# Patient Record
Sex: Female | Born: 1950 | Race: White | Hispanic: No | Marital: Married | Smoking: Former smoker
Health system: Southern US, Community
[De-identification: ages and names within clinical notes are randomized; demographics above are authoritative.]

## PROBLEM LIST (undated history)

## (undated) DIAGNOSIS — I10 Essential (primary) hypertension: Secondary | ICD-10-CM

## (undated) DIAGNOSIS — S7291XA Unspecified fracture of right femur, initial encounter for closed fracture: Secondary | ICD-10-CM

## (undated) DIAGNOSIS — F909 Attention-deficit hyperactivity disorder, unspecified type: Secondary | ICD-10-CM

## (undated) DIAGNOSIS — F32A Depression, unspecified: Secondary | ICD-10-CM

## (undated) DIAGNOSIS — F419 Anxiety disorder, unspecified: Secondary | ICD-10-CM

## (undated) DIAGNOSIS — M549 Dorsalgia, unspecified: Secondary | ICD-10-CM

## (undated) DIAGNOSIS — E538 Deficiency of other specified B group vitamins: Secondary | ICD-10-CM

## (undated) DIAGNOSIS — A770 Spotted fever due to Rickettsia rickettsii: Secondary | ICD-10-CM

## (undated) DIAGNOSIS — R569 Unspecified convulsions: Secondary | ICD-10-CM

## (undated) DIAGNOSIS — G8929 Other chronic pain: Secondary | ICD-10-CM

## (undated) DIAGNOSIS — F329 Major depressive disorder, single episode, unspecified: Secondary | ICD-10-CM

## (undated) HISTORY — DX: Major depressive disorder, single episode, unspecified: F32.9

## (undated) HISTORY — PX: EYE SURGERY: SHX253

## (undated) HISTORY — DX: Deficiency of other specified B group vitamins: E53.8

## (undated) HISTORY — DX: Depression, unspecified: F32.A

## (undated) HISTORY — DX: Essential (primary) hypertension: I10

## (undated) HISTORY — PX: BACK SURGERY: SHX140

## (undated) HISTORY — PX: OTHER SURGICAL HISTORY: SHX169

---

## 2012-10-17 ENCOUNTER — Emergency Department (HOSPITAL_COMMUNITY): Payer: Medicare Other

## 2012-10-17 ENCOUNTER — Encounter (HOSPITAL_COMMUNITY): Payer: Self-pay | Admitting: Emergency Medicine

## 2012-10-17 ENCOUNTER — Observation Stay (HOSPITAL_COMMUNITY)
Admission: EM | Admit: 2012-10-17 | Discharge: 2012-10-17 | Disposition: A | Discharge status: Home / Self Care | Payer: Medicare Other | Attending: Emergency Medicine | Admitting: Emergency Medicine

## 2012-10-17 DIAGNOSIS — G8929 Other chronic pain: Secondary | ICD-10-CM | POA: Insufficient documentation

## 2012-10-17 DIAGNOSIS — G40909 Epilepsy, unspecified, not intractable, without status epilepticus: Principal | ICD-10-CM | POA: Insufficient documentation

## 2012-10-17 DIAGNOSIS — R569 Unspecified convulsions: Secondary | ICD-10-CM

## 2012-10-17 HISTORY — DX: Unspecified convulsions: R56.9

## 2012-10-17 HISTORY — DX: Spotted fever due to Rickettsia rickettsii: A77.0

## 2012-10-17 LAB — BASIC METABOLIC PANEL
CO2: 22 mEq/L (ref 19–32)
Calcium: 8.9 mg/dL (ref 8.4–10.5)
Creatinine, Ser: 0.82 mg/dL (ref 0.50–1.10)

## 2012-10-17 LAB — URINALYSIS, ROUTINE W REFLEX MICROSCOPIC
Glucose, UA: NEGATIVE mg/dL
Hgb urine dipstick: NEGATIVE
Ketones, ur: NEGATIVE mg/dL
Leukocytes, UA: NEGATIVE
Protein, ur: NEGATIVE mg/dL

## 2012-10-17 LAB — CBC WITH DIFFERENTIAL/PLATELET
Basophils Absolute: 0.1 10*3/uL (ref 0.0–0.1)
Basophils Relative: 2 % — ABNORMAL HIGH (ref 0–1)
Eosinophils Absolute: 0 10*3/uL (ref 0.0–0.7)
Eosinophils Relative: 1 % (ref 0–5)
HCT: 38.5 % (ref 36.0–46.0)
Lymphocytes Relative: 45 % (ref 12–46)
MCH: 28.4 pg (ref 26.0–34.0)
MCHC: 33.2 g/dL (ref 30.0–36.0)
MCV: 85.6 fL (ref 78.0–100.0)
Monocytes Absolute: 0.3 10*3/uL (ref 0.1–1.0)
RDW: 13.6 % (ref 11.5–15.5)

## 2012-10-17 LAB — RAPID URINE DRUG SCREEN, HOSP PERFORMED
Amphetamines: POSITIVE — AB
Tetrahydrocannabinol: NOT DETECTED

## 2012-10-17 MED ORDER — LORAZEPAM 2 MG/ML IJ SOLN
1.0000 mg | Freq: Once | INTRAMUSCULAR | Status: AC
  Administered 2012-10-17: 1 mg via INTRAVENOUS
  Filled 2012-10-17: qty 1

## 2012-10-17 NOTE — ED Notes (Signed)
Pt presents to ED via EMS after having 5 seizures in 2 days. Pt have history of seizures. CBG 60, pt states her cbg is normally 60-80. 20g in left hand.

## 2012-10-17 NOTE — ED Notes (Signed)
Pt discharged to home with family. NAD.  

## 2012-10-17 NOTE — ED Notes (Signed)
Entered room and patient had pulled IV out, pulled all ecg wires bp cuff and pulse ox off. Stated that she had a bad dream and did not remember doing any of that. Nero checks normal.

## 2012-10-17 NOTE — Consult Note (Signed)
Reason for Consult:Seizures Referring Physician: Pollina  CC: Seizures  HPI: Kelli Navarro is an 62 y.o. female with a history of seizures and chronic pain.  The patient reports that she has been told that she has tonic-clonic seizures.  Takes Topamax and Clonazepam.  Is only able to take these two medications-has been on 16 other anticonvulsants at some point in time and has had problems with all of them.  Her doctor has told her there is nothing else to try.  Her seizures are usually during sleep but for the past two days she has had multiple seizures during the day and during the night.  Today she could not take it any longer and presented for evaluation.   On Friday she was to pick up her pain medications and was told at that time that she needed to have a UDS first.  The patient was unable to do this and therefore was unable to get her medications.  She has been very stressed due to this and she feels this has likely brought on her seizures.    Past Medical History  Diagnosis Date  . [facility] spotted fever   . Seizures     History reviewed. No pertinent past surgical history.  History reviewed. No pertinent family history.  Social History:  has no tobacco, alcohol, and drug history on file.  No Known Allergies  Medications: I have reviewed the patient's current medications. Prior to Admission:  Current outpatient prescriptions:albuterol (PROVENTIL HFA;VENTOLIN HFA) 108 (90 BASE) MCG/ACT inhaler, Inhale 2 puffs into the lungs every 6 (six) hours as needed for wheezing., Disp: , Rfl: ;  amphetamine-dextroamphetamine (ADDERALL) 20 MG tablet, Take 20 mg by mouth every 6 (six) hours., Disp: , Rfl: ;  clonazePAM (KLONOPIN) 1 MG tablet, Take 1 mg by mouth every 8 (eight) hours., Disp: , Rfl:  HYDROcodone-acetaminophen (NORCO) 10-325 MG per tablet, Take 1 tablet by mouth every 6 (six) hours as needed for pain., Disp: , Rfl: ;  metoprolol succinate (TOPROL-XL) 50 MG 24 hr tablet, Take  50 mg by mouth daily. Take with or immediately following a meal., Disp: , Rfl: ;  oxymorphone (OPANA ER) 20 MG 12 hr tablet, Take 20 mg by mouth every 12 (twelve) hours., Disp: , Rfl:   ROS: History obtained from the patient  General ROS: negative for - chills, fatigue, fever, night sweats, weight gain or weight loss Psychological ROS: negative for - behavioral disorder, hallucinations, memory difficulties, mood swings or suicidal ideation Ophthalmic ROS: negative for - blurry vision, double vision, eye pain or loss of vision ENT ROS: negative for - epistaxis, nasal discharge, oral lesions, sore throat, tinnitus or vertigo Allergy and Immunology ROS: negative for - hives or itchy/watery eyes Hematological and Lymphatic ROS: negative for - bleeding problems, bruising or swollen lymph nodes Endocrine ROS: negative for - galactorrhea, hair pattern changes, polydipsia/polyuria or temperature intolerance Respiratory ROS: negative for - cough, hemoptysis, shortness of breath or wheezing Cardiovascular ROS: negative for - chest pain, dyspnea on exertion, edema or irregular heartbeat Gastrointestinal ROS: negative for - abdominal pain, diarrhea, hematemesis, nausea/vomiting or stool incontinence Genito-Urinary ROS: negative for - dysuria, hematuria, incontinence or urinary frequency/urgency Musculoskeletal ROS: chronic pain Neurological ROS: as noted in HPI Dermatological ROS: negative for rash and skin lesion changes  Physical Examination: Blood pressure 144/82, pulse 73, temperature 98.5 F (36.9 C), temperature source Oral, resp. rate 20, SpO2 98.00%.  Neurologic Examination Mental Status: On entering the room the patient was in a ball  crying and reporting that she was not going to talk to me because everyone had been lying to her today.  She eventually relaxed and was alert, oriented, thought content appropriate.  Speech fluent without evidence of aphasia.  Able to follow 3 step commands  without difficulty. Cranial Nerves: II: Discs flat bilaterally; Visual fields grossly normal, pupils equal, round, reactive to light and accommodation III,IV, VI: ptosis not present, extra-ocular motions intact bilaterally V,VII: smile symmetric, facial light touch sensation normal bilaterally VIII: hearing normal bilaterally IX,X: gag reflex present XI: bilateral shoulder shrug XII: midline tongue extension Motor: Right : Upper extremity   5/5    Left:     Upper extremity   5/5  Lower extremity   5/5     Lower extremity   5/5 Tone and bulk:normal tone throughout; no atrophy noted Sensory: Pinprick and light touch intact throughout, bilaterally Deep Tendon Reflexes: 2+ and symmetric with 1+ AJ's bilaterally Plantars: Right: downgoing   Left: downgoing Cerebellar: normal finger-to-nose and normal heel-to-shin test Gait: not tested CV: pulses palpable throughout   Laboratory Studies:   Basic Metabolic Panel:  Recent Labs Lab 10/17/12 1405  NA 143  K 3.7  CL 112  CO2 22  GLUCOSE 89  BUN 18  CREATININE 0.82  CALCIUM 8.9    Liver Function Tests: No results found for this basename: AST, ALT, ALKPHOS, BILITOT, PROT, ALBUMIN,  in the last 168 hours No results found for this basename: LIPASE, AMYLASE,  in the last 168 hours No results found for this basename: AMMONIA,  in the last 168 hours  CBC:  Recent Labs Lab 10/17/12 1405  WBC 3.0*  NEUTROABS 1.3*  HGB 12.8  HCT 38.5  MCV 85.6  PLT 268    Cardiac Enzymes: No results found for this basename: CKTOTAL, CKMB, CKMBINDEX, TROPONINI,  in the last 168 hours  BNP: No components found with this basename: POCBNP,   CBG: No results found for this basename: GLUCAP,  in the last 168 hours  Microbiology: No results found for this or any previous visit.  Coagulation Studies: No results found for this basename: LABPROT, INR,  in the last 72 hours  Urinalysis:  Recent Labs Lab 10/17/12 1550  COLORURINE YELLOW   LABSPEC 1.013  PHURINE 8.0  GLUCOSEU NEGATIVE  HGBUR NEGATIVE  BILIRUBINUR NEGATIVE  KETONESUR NEGATIVE  PROTEINUR NEGATIVE  UROBILINOGEN 0.2  NITRITE NEGATIVE  LEUKOCYTESUR NEGATIVE    Lipid Panel:  No results found for this basename: chol, trig, hdl, cholhdl, vldl, ldlcalc    HgbA1C:  No results found for this basename: HGBA1C    Urine Drug Screen:     Component Value Date/Time   LABOPIA POSITIVE* 10/17/2012 1550   COCAINSCRNUR NONE DETECTED 10/17/2012 1550   LABBENZ NONE DETECTED 10/17/2012 1550   AMPHETMU POSITIVE* 10/17/2012 1550   THCU NONE DETECTED 10/17/2012 1550   LABBARB NONE DETECTED 10/17/2012 1550    Alcohol Level: No results found for this basename: ETH,  in the last 168 hours  Imaging: Ct Head Wo Contrast  10/17/2012  *RADIOLOGY REPORT*  Clinical Data: Multiple seizures in 2 days.  CT HEAD WITHOUT CONTRAST  Technique:  Contiguous axial images were obtained from the base of the skull through the vertex without contrast.  Comparison: None.  Findings: There is no evidence for acute infarction, intracranial hemorrhage, mass lesion, hydrocephalus, or extra-axial fluid.  Mild atrophy appropriate for age.  Mild chronic microvascular ischemic change.  Calvarium intact.  Clear sinuses and mastoids.  No osseous destructive lesions.  IMPRESSION: Mild atrophy and chronic microvascular ischemic change.  No intracranial mass lesion.  No visible abnormality which might result in increased seizure frequency.   Original Report Authenticated By: Davonna Belling, M.D.      Assessment/Plan: 62 year old female with a history of seizures and chronic pain followed by Dr. Elouise Munroe on an outpatient basis.  Was unable to get her pain medication on Friday and has had an increase in seizures this weekend.  Reports that she is on the only two seizure medications that she is able to take.  Is unable to obtain any narcotics here due to her pain contract with Dr. Elouise Munroe.  Has had no seizures since  presentation here.  On further conversation it seems that Dr. Elouise Munroe has asked the patient to take the Clonazepam four times a day-currently she only takes it two times because it makes her drowsy.   Patient did have a head CT that was reviewed and showed no acute abnormalities.   Recommendations: 1.  Do not feel that the patient will be served by a hospital admission-we have no medications we can treat her with for her seizures or for her pain.  Patient is aware.   2.  Have advised patient to take an additional 1-2 doses of Clonazepam today.  This should help with her seizures and her pain and hold her over tonight.  She can then get in touch with Dr. Elouise Munroe in the morning and her medication issues can be resolved.  Patient agrees with this plan.  3.  Patient to continue her Topamax at the prescribed dose.    Case discussed with Dr. Juleen China and with the teaching service  Thana Farr, MD [facility] [phone] 10/17/2012, 6:08 PM

## 2012-10-17 NOTE — Consult Note (Cosign Needed)
Hospital Consult Note Date: 10/17/2012  Patient name: Kelli Navarro Medical record number: [id number] Date of birth: 08/29/1950 Age: 62 y.o. Gender: female PCP: Tarri Fuller, MD  Medical Service: Internal medicine teaching service Attending name: Dr. Blinda Leatherwood  Reason for consult: Possible admission  History of Present Illness: The patient states that she was refused her pain medicine, opana and hydrocodone, by her PCP on Friday and has been out since then. This precipitated her to have "seizures". Her partner is in the room with her and states she has never seen one because they happen in the middle of the night and she is asleep. The patient mentions that she does not remember them afterwards she just knows. She also states that they last 1 hour each. Nothing makes them better. She does not lose bowel or bladder function. She is able to return to normal however is weak between episodes. She has not had any fevers or chills at home. She has not had any vomiting, nausea, diarrhea. She has not had any chest pain (worse than usual), SOB, leg pain. She is currently back to her normal mental function per her partner.   Meds:  Current outpatient prescriptions: albuterol (PROVENTIL HFA;VENTOLIN HFA) 108 (90 BASE) MCG/ACT inhaler, Inhale 2 puffs into the lungs every 6 (six) hours as needed for wheezing.,  amphetamine-dextroamphetamine (ADDERALL) 20 MG tablet, Take 20 mg by mouth every 6 (six) hours.,  clonazePAM (KLONOPIN) 1 MG tablet, Take 1 mg by mouth every 8 (eight) hours HYDROcodone-acetaminophen (NORCO) 10-325 MG per tablet, Take 1 tablet by mouth every 6 (six) hours as needed for pain.,  metoprolol succinate (TOPROL-XL) 50 MG 24 hr tablet,  oxymorphone (OPANA ER) 20 MG 12 hr tablet, Take 20 mg by mouth every 12 (twelve) hours  Allergies: Review of patient's allergies indicates no known allergies. Past Medical History  Diagnosis Date  . [facility] spotted fever   . Seizures     History reviewed. No pertinent past surgical history. History reviewed. No pertinent family history. History   Social History  . Marital Status: Single    Spouse Name: N/A    Number of Children: N/A  . Years of Education: N/A   Occupational History  . Not on file.   Social History Main Topics  . Smoking status: Not on file  . Smokeless tobacco: Not on file  . Alcohol Use: Not on file  . Drug Use: Not on file  . Sexually Active: Not on file   Other Topics Concern  . Not on file   Social History Narrative  . No narrative on file    Review of Systems: Pertinent items are noted in HPI.  Physical Exam: Blood pressure 144/82, pulse 73, temperature 98.5 F (36.9 C), temperature source Oral, resp. rate 20, SpO2 98.00%. General: resting in bed HEENT: PERRL, EOMI, no scleral icterus Cardiac: RRR, no rubs, murmurs or gallops Pulm: clear to auscultation bilaterally, moving normal volumes of air Abd: soft, nontender, nondistended, BS present Ext: warm and well perfused, no pedal edema Neurologic Examination:  A and O times 3 with normal responses, somewhat distracted during our conversation, CN II to XII intact grossly, strength and reflexes normal, sensation to light touch intact globally and equal, reflexes normal  Lab results: Basic Metabolic Panel:  Recent Labs  78/29/56 1405  NA 143  K 3.7  CL 112  CO2 22  GLUCOSE 89  BUN 18  CREATININE 0.82  CALCIUM 8.9   CBC:  Recent Labs  10/17/12 1405  WBC 3.0*  NEUTROABS 1.3*  HGB 12.8  HCT 38.5  MCV 85.6  PLT 268   Urine Drug Screen: Drugs of Abuse     Component Value Date/Time   LABOPIA POSITIVE* 10/17/2012 1550   COCAINSCRNUR NONE DETECTED 10/17/2012 1550   LABBENZ NONE DETECTED 10/17/2012 1550   AMPHETMU POSITIVE* 10/17/2012 1550   THCU NONE DETECTED 10/17/2012 1550   LABBARB NONE DETECTED 10/17/2012 1550    Alcohol Level: No results found for this basename: ETH,  in the last 72 hours  Imaging results:   Ct Head Wo Contrast  10/17/2012  *RADIOLOGY REPORT*  Clinical Data: Multiple seizures in 2 days.  CT HEAD WITHOUT CONTRAST  Technique:  Contiguous axial images were obtained from the base of the skull through the vertex without contrast.  Comparison: None.  Findings: There is no evidence for acute infarction, intracranial hemorrhage, mass lesion, hydrocephalus, or extra-axial fluid.  Mild atrophy appropriate for age.  Mild chronic microvascular ischemic change.  Calvarium intact.  Clear sinuses and mastoids.  No osseous destructive lesions.  IMPRESSION: Mild atrophy and chronic microvascular ischemic change.  No intracranial mass lesion.  No visible abnormality which might result in increased seizure frequency.   Original Report Authenticated By: Davonna Belling, M.D.    Assessment & Plan by Problem:  Seizure-like activity -  Do not feel as though the patient's symptoms are related to actual seizure disorder however do feel as though she may benefit from psychiatric evaluation as an out-patient. Seizure only occur at night when others do not see them. Doubt she is having seizures and has not had any here. Feel she is safe to discharge home with close follow up with her PCP.   Chronic opioid use - Unclear why patient is requiring such high doses, opana 20 mg BID and oxycodone 10/325. She was unable to stay but is stable and can be evaluated for need by her PCP.   Chronic amphetamine use - Per the patient she is having ADHD and has been on adderall since she was 17 however do not see need currently. She is able to concentrate. May be related to some other mental health component which can safely be evaluated as an out-patient as this an ongoing, chronic issue.   SignedGenella Mech 10/17/2012, 6:06 PM

## 2012-10-17 NOTE — ED Provider Notes (Signed)
History     CSN: [id number]  Arrival date & time 10/17/12  1318   First MD Initiated Contact with Patient 10/17/12 1346      Chief Complaint  Patient presents with  . Seizures    (Consider location/radiation/quality/duration/timing/severity/associated sxs/prior treatment) HPI Comments: Patient presents to the ER for evaluation of multiple seizures. Family reports that the patient has had multiple seizures over last 2 days. She does have a history of seizures. Patient reportedly takes Topamax and Klonopin for these. Patient had 2 seizures yesterday and then several the course of today. Patient brought to the ER by EMS. At arrival to the ER, patient is likely postictal, not following commands. Information provided by family. Level 5 caveat applies.  Patient is a 62 y.o. female presenting with seizures.  Seizures   Past Medical History  Diagnosis Date  . [facility] spotted fever   . Seizures     History reviewed. No pertinent past surgical history.  History reviewed. No pertinent family history.  History  Substance Use Topics  . Smoking status: Not on file  . Smokeless tobacco: Not on file  . Alcohol Use: Not on file    OB History   Grav Para Term Preterm Abortions TAB SAB Ect Mult Living                  Review of Systems  Unable to perform ROS Neurological: Positive for seizures.    Allergies  Review of patient's allergies indicates no known allergies.  Home Medications   Current Outpatient Rx  Name  Route  Sig  Dispense  Refill  . albuterol (PROVENTIL HFA;VENTOLIN HFA) 108 (90 BASE) MCG/ACT inhaler   Inhalation   Inhale 2 puffs into the lungs every 6 (six) hours as needed for wheezing.         Marland Kitchen amphetamine-dextroamphetamine (ADDERALL) 20 MG tablet   Oral   Take 20 mg by mouth every 6 (six) hours.         . clonazePAM (KLONOPIN) 1 MG tablet   Oral   Take 1 mg by mouth every 8 (eight) hours.         Marland Kitchen HYDROcodone-acetaminophen (NORCO)  10-325 MG per tablet   Oral   Take 1 tablet by mouth every 6 (six) hours as needed for pain.         . metoprolol succinate (TOPROL-XL) 50 MG 24 hr tablet   Oral   Take 50 mg by mouth daily. Take with or immediately following a meal.         . oxymorphone (OPANA ER) 20 MG 12 hr tablet   Oral   Take 20 mg by mouth every 12 (twelve) hours.           BP 127/74  Pulse 75  Temp(Src) 98.5 F (36.9 C) (Oral)  Resp 21  SpO2 100%  Physical Exam  Constitutional: She appears well-developed and well-nourished. She appears listless. No distress.  HENT:  Head: Normocephalic and atraumatic.  Right Ear: Hearing normal.  Nose: Nose normal.  Mouth/Throat: Oropharynx is clear and moist and mucous membranes are normal.  Eyes: Conjunctivae and EOM are normal. Pupils are equal, round, and reactive to light.  Neck: Normal range of motion. Neck supple.  Cardiovascular: Normal rate, regular rhythm, S1 normal and S2 normal.  Exam reveals no gallop and no friction rub.   No murmur heard. Pulmonary/Chest: Effort normal and breath sounds normal. No respiratory distress. She exhibits no tenderness.  Abdominal: Soft. Normal  appearance and bowel sounds are normal. There is no hepatosplenomegaly. There is no tenderness. There is no rebound, no guarding, no tenderness at McBurney's point and negative Murphy's sign. No hernia.  Musculoskeletal: Normal range of motion.  Neurological: She has normal strength. She appears listless. No cranial nerve deficit or sensory deficit. Coordination normal. GCS eye subscore is 4. GCS verbal subscore is 3. GCS motor subscore is 6.  Skin: Skin is warm, dry and intact. No rash noted. No cyanosis.  Psychiatric: She has a normal mood and affect. Her speech is normal and behavior is normal. Thought content normal.    ED Course  Procedures (including critical care time)  Labs Reviewed  CBC WITH DIFFERENTIAL - Abnormal; Notable for the following:    WBC 3.0 (*)    Neutro  Abs 1.3 (*)    Basophils Relative 2 (*)    All other components within normal limits  BASIC METABOLIC PANEL - Abnormal; Notable for the following:    GFR calc non Af Amer 75 (*)    GFR calc Af Amer 87 (*)    All other components within normal limits  URINALYSIS, ROUTINE W REFLEX MICROSCOPIC  URINE RAPID DRUG SCREEN (HOSP PERFORMED)   Ct Head Wo Contrast  10/17/2012  *RADIOLOGY REPORT*  Clinical Data: Multiple seizures in 2 days.  CT HEAD WITHOUT CONTRAST  Technique:  Contiguous axial images were obtained from the base of the skull through the vertex without contrast.  Comparison: None.  Findings: There is no evidence for acute infarction, intracranial hemorrhage, mass lesion, hydrocephalus, or extra-axial fluid.  Mild atrophy appropriate for age.  Mild chronic microvascular ischemic change.  Calvarium intact.  Clear sinuses and mastoids.  No osseous destructive lesions.  IMPRESSION: Mild atrophy and chronic microvascular ischemic change.  No intracranial mass lesion.  No visible abnormality which might result in increased seizure frequency.   Original Report Authenticated By: Davonna Belling, M.D.      Diagnosis: Seizures    MDM  She comes to the ER for evaluation of multiple seizures. Patient does have a previous seizure disorder. She previously has been seeing a neurologist in [place]. She takes Topamax and Klonopin. Patient reports 5 seizures over the last 24 hours which is a significant increase for her. At arrival to the ER she was postictal, confused having difficulty following commands answering questions. She has quickly cleared here in the ER and has been no further seizures activity. She was given Ativan. Head CT and lab work unremarkable. With the significant increase in her seizures, will ask for admission.        Gilda Crease, MD 10/17/12 [phone]

## 2014-12-21 ENCOUNTER — Encounter (HOSPITAL_COMMUNITY): Payer: Self-pay | Admitting: Emergency Medicine

## 2014-12-21 ENCOUNTER — Emergency Department (HOSPITAL_COMMUNITY)
Admission: EM | Admit: 2014-12-21 | Discharge: 2014-12-21 | Disposition: A | Discharge status: Home / Self Care | Payer: Managed Care, Other (non HMO) | Attending: Emergency Medicine | Admitting: Emergency Medicine

## 2014-12-21 ENCOUNTER — Emergency Department (HOSPITAL_COMMUNITY): Payer: Managed Care, Other (non HMO)

## 2014-12-21 DIAGNOSIS — Z79899 Other long term (current) drug therapy: Secondary | ICD-10-CM | POA: Diagnosis not present

## 2014-12-21 DIAGNOSIS — F1110 Opioid abuse, uncomplicated: Secondary | ICD-10-CM | POA: Diagnosis not present

## 2014-12-21 DIAGNOSIS — Z8619 Personal history of other infectious and parasitic diseases: Secondary | ICD-10-CM | POA: Diagnosis not present

## 2014-12-21 DIAGNOSIS — F1310 Sedative, hypnotic or anxiolytic abuse, uncomplicated: Secondary | ICD-10-CM | POA: Insufficient documentation

## 2014-12-21 DIAGNOSIS — R630 Anorexia: Secondary | ICD-10-CM | POA: Diagnosis not present

## 2014-12-21 DIAGNOSIS — G8929 Other chronic pain: Secondary | ICD-10-CM | POA: Insufficient documentation

## 2014-12-21 DIAGNOSIS — R110 Nausea: Secondary | ICD-10-CM | POA: Diagnosis not present

## 2014-12-21 DIAGNOSIS — G40909 Epilepsy, unspecified, not intractable, without status epilepticus: Secondary | ICD-10-CM | POA: Diagnosis not present

## 2014-12-21 DIAGNOSIS — R55 Syncope and collapse: Secondary | ICD-10-CM | POA: Insufficient documentation

## 2014-12-21 DIAGNOSIS — F1910 Other psychoactive substance abuse, uncomplicated: Secondary | ICD-10-CM | POA: Insufficient documentation

## 2014-12-21 DIAGNOSIS — R51 Headache: Secondary | ICD-10-CM | POA: Diagnosis present

## 2014-12-21 HISTORY — DX: Dorsalgia, unspecified: M54.9

## 2014-12-21 HISTORY — DX: Other chronic pain: G89.29

## 2014-12-21 LAB — CBC WITH DIFFERENTIAL/PLATELET
BASOS ABS: 0 10*3/uL (ref 0.0–0.1)
BASOS PCT: 1 % (ref 0–1)
EOS ABS: 0.1 10*3/uL (ref 0.0–0.7)
Eosinophils Relative: 2 % (ref 0–5)
HCT: 39.3 % (ref 36.0–46.0)
Hemoglobin: 12.3 g/dL (ref 12.0–15.0)
Lymphocytes Relative: 45 % (ref 12–46)
Lymphs Abs: 1.4 10*3/uL (ref 0.7–4.0)
MCH: 27.8 pg (ref 26.0–34.0)
MCHC: 31.3 g/dL (ref 30.0–36.0)
MCV: 88.7 fL (ref 78.0–100.0)
MONOS PCT: 7 % (ref 3–12)
Monocytes Absolute: 0.2 10*3/uL (ref 0.1–1.0)
NEUTROS PCT: 45 % (ref 43–77)
Neutro Abs: 1.4 10*3/uL — ABNORMAL LOW (ref 1.7–7.7)
PLATELETS: 263 10*3/uL (ref 150–400)
RBC: 4.43 MIL/uL (ref 3.87–5.11)
RDW: 13.4 % (ref 11.5–15.5)
WBC: 3.2 10*3/uL — ABNORMAL LOW (ref 4.0–10.5)

## 2014-12-21 LAB — COMPREHENSIVE METABOLIC PANEL
ALBUMIN: 3.1 g/dL — AB (ref 3.5–5.0)
ALK PHOS: 85 U/L (ref 38–126)
ALT: 9 U/L — AB (ref 14–54)
AST: 13 U/L — AB (ref 15–41)
Anion gap: 7 (ref 5–15)
BUN: 9 mg/dL (ref 6–20)
CALCIUM: 8.4 mg/dL — AB (ref 8.9–10.3)
CO2: 22 mmol/L (ref 22–32)
Chloride: 110 mmol/L (ref 101–111)
Creatinine, Ser: 0.94 mg/dL (ref 0.44–1.00)
GFR calc Af Amer: >60 mL/min (ref >60)
GFR calc non Af Amer: >60 mL/min (ref >60)
Glucose, Bld: 93 mg/dL (ref 65–99)
POTASSIUM: 3.6 mmol/L (ref 3.5–5.1)
Sodium: 139 mmol/L (ref 135–145)
TOTAL PROTEIN: 5.9 g/dL — AB (ref 6.5–8.1)
Total Bilirubin: 0.3 mg/dL (ref 0.3–1.2)

## 2014-12-21 LAB — URINALYSIS, ROUTINE W REFLEX MICROSCOPIC
BILIRUBIN URINE: NEGATIVE
Glucose, UA: NEGATIVE mg/dL
HGB URINE DIPSTICK: NEGATIVE
KETONES UR: NEGATIVE mg/dL
NITRITE: NEGATIVE
Protein, ur: NEGATIVE mg/dL
SPECIFIC GRAVITY, URINE: 1.008 (ref 1.005–1.030)
UROBILINOGEN UA: 0.2 mg/dL (ref 0.0–1.0)
pH: 7 (ref 5.0–8.0)

## 2014-12-21 LAB — RAPID URINE DRUG SCREEN, HOSP PERFORMED
Amphetamines: POSITIVE — AB
Barbiturates: NOT DETECTED
Benzodiazepines: POSITIVE — AB
Cocaine: NOT DETECTED
OPIATES: POSITIVE — AB
Tetrahydrocannabinol: NOT DETECTED

## 2014-12-21 LAB — URINE MICROSCOPIC-ADD ON

## 2014-12-21 LAB — AMMONIA: Ammonia: 25 umol/L (ref 9–35)

## 2014-12-21 MED ORDER — ONDANSETRON 4 MG PO TBDP
4.0000 mg | ORAL_TABLET | Freq: Once | ORAL | Status: AC
  Administered 2014-12-21: 4 mg via ORAL
  Filled 2014-12-21: qty 1

## 2014-12-21 MED ORDER — CLONAZEPAM 0.5 MG PO TABS
1.0000 mg | ORAL_TABLET | Freq: Once | ORAL | Status: AC
  Administered 2014-12-21: 1 mg via ORAL
  Filled 2014-12-21: qty 2

## 2014-12-21 NOTE — ED Notes (Signed)
Phlebotomy at bedside. Pt states she is unable to urinate at this time.

## 2014-12-21 NOTE — ED Provider Notes (Signed)
Patient had episode days ago where she had syncopal event fell and struck her head. She had a similar episode 2 weeks ago and fell down several steps. She complains of a frontal headache since the event. She also presently complains of anxiety. Patient is alert Glasgow Coma Score 15 moves all extremities well. Heart regular rate and rhythm no rest for distress neurologic Glasgow Coma Score 15 results from as well. Cranial nerves II through XII grossly intact. Hospitalization Results for orders placed or performed during the hospital encounter of 12/21/14  Urinalysis, Routine w reflex microscopic (not at [facility])  Result Value Ref Range   Color, Urine YELLOW YELLOW   APPearance CLEAR CLEAR   Specific Gravity, Urine 1.008 1.005 - 1.030   pH 7.0 5.0 - 8.0   Glucose, UA NEGATIVE NEGATIVE mg/dL   Hgb urine dipstick NEGATIVE NEGATIVE   Bilirubin Urine NEGATIVE NEGATIVE   Ketones, ur NEGATIVE NEGATIVE mg/dL   Protein, ur NEGATIVE NEGATIVE mg/dL   Urobilinogen, UA 0.2 0.0 - 1.0 mg/dL   Nitrite NEGATIVE NEGATIVE   Leukocytes, UA MODERATE (A) NEGATIVE  CBC with Differential  Result Value Ref Range   WBC 3.2 (L) 4.0 - 10.5 K/uL   RBC 4.43 3.87 - 5.11 MIL/uL   Hemoglobin 12.3 12.0 - 15.0 g/dL   HCT 39.3 36.0 - 46.0 %   MCV 88.7 78.0 - 100.0 fL   MCH 27.8 26.0 - 34.0 pg   MCHC 31.3 30.0 - 36.0 g/dL   RDW 13.4 11.5 - 15.5 %   Platelets 263 150 - 400 K/uL   Neutrophils Relative % 45 43 - 77 %   Neutro Abs 1.4 (L) 1.7 - 7.7 K/uL   Lymphocytes Relative 45 12 - 46 %   Lymphs Abs 1.4 0.7 - 4.0 K/uL   Monocytes Relative 7 3 - 12 %   Monocytes Absolute 0.2 0.1 - 1.0 K/uL   Eosinophils Relative 2 0 - 5 %   Eosinophils Absolute 0.1 0.0 - 0.7 K/uL   Basophils Relative 1 0 - 1 %   Basophils Absolute 0.0 0.0 - 0.1 K/uL  Urine rapid drug screen (hosp performed)  Result Value Ref Range   Opiates POSITIVE (A) NONE DETECTED   Cocaine NONE DETECTED NONE DETECTED   Benzodiazepines POSITIVE (A) NONE DETECTED   Amphetamines POSITIVE (A) NONE DETECTED   Tetrahydrocannabinol NONE DETECTED NONE DETECTED   Barbiturates NONE DETECTED NONE DETECTED  Comprehensive metabolic panel  Result Value Ref Range   Sodium 139 135 - 145 mmol/L   Potassium 3.6 3.5 - 5.1 mmol/L   Chloride 110 101 - 111 mmol/L   CO2 22 22 - 32 mmol/L   Glucose, Bld 93 65 - 99 mg/dL   BUN 9 6 - 20 mg/dL   Creatinine, Ser 0.94 0.44 - 1.00 mg/dL   Calcium 8.4 (L) 8.9 - 10.3 mg/dL   Total Protein 5.9 (L) 6.5 - 8.1 g/dL   Albumin 3.1 (L) 3.5 - 5.0 g/dL   AST 13 (L) 15 - 41 U/L   ALT 9 (L) 14 - 54 U/L   Alkaline Phosphatase 85 38 - 126 U/L   Total Bilirubin 0.3 0.3 - 1.2 mg/dL   GFR calc non Af Amer >60 >60 mL/min   GFR calc Af Amer >60 >60 mL/min   Anion gap 7 5 - 15  Ammonia  Result Value Ref Range   Ammonia 25 9 - 35 umol/L  Urine microscopic-add on  Result Value Ref Range  Squamous Epithelial / LPF FEW (A) RARE   WBC, UA 11-20 <3 WBC/hpf   Bacteria, UA FEW (A) RARE   Ct Head Wo Contrast  12/21/2014   CLINICAL DATA:  Intermittent syncopal episodes over the last month with frequent falls. Last episode 5 days ago. History of seizures. Initial encounter.  EXAM: CT HEAD WITHOUT CONTRAST  TECHNIQUE: Contiguous axial images were obtained from the base of the skull through the vertex without intravenous contrast.  COMPARISON:  Head CT 10/17/2012.  FINDINGS: There is no evidence of acute intracranial hemorrhage, mass lesion, brain edema or extra-axial fluid collection. The ventricles and subarachnoid spaces are mildly prominent but stable. There is no CT evidence of acute cortical infarction. There is stable mild periventricular white matter disease.  The visualized paranasal sinuses, mastoid air cells and middle ears are clear. The calvarium is intact.  IMPRESSION: Stable head CT demonstrating mild atrophy and chronic microvascular ischemic changes. No acute intracranial findings.   Electronically Signed   By: Richardean Sale M.D.   On:  12/21/2014 16:44  hospitalization offered to patient which she declines..Urinary tract infection. No urinary symptoms. I spoke with Shelby Dubin, nurse practitioner for patient's primary care physician plan patient to call office tomorrow and will be seen tomorrow. Suggest event monitor Diagnosis syncope   Orlie Dakin, MD 12/21/14 2012

## 2014-12-21 NOTE — ED Notes (Signed)
Pt family grows increasingly anxious and are ready to leave. Gerald Stabs, [place] made aware.

## 2014-12-21 NOTE — Discharge Instructions (Signed)
Syncope  Call Dr. Jilda Roche office tomorrow morning as soon as the office opens. They will schedule you for an office appointment tomorrow. Return if your condition worsens for any reason  Syncope means a person passes out (faints). The person usually wakes up in less than 5 minutes. It is important to seek medical care for syncope. HOME CARE  Have someone stay with you until you feel normal.  Do not drive, use machines, or play sports until your doctor says it is okay.  Keep all doctor visits as told.  Lie down when you feel like you might pass out. Take deep breaths. Wait until you feel normal before standing up.  Drink enough fluids to keep your pee (urine) clear or pale yellow.  If you take blood pressure or heart medicine, get up slowly. Take several minutes to sit and then stand. GET HELP RIGHT AWAY IF:   You have a severe headache.  You have pain in the chest, belly (abdomen), or back.  You are bleeding from the mouth or butt (rectum).  You have black or tarry poop (stool).  You have an irregular or very fast heartbeat.  You have pain with breathing.  You keep passing out, or you have shaking (seizures) when you pass out.  You pass out when sitting or lying down.  You feel confused.  You have trouble walking.  You have severe weakness.  You have vision problems. If you fainted, call your local emergency services (911 in [place] Do not drive yourself to the hospital. MAKE SURE YOU:   Understand these instructions.  Will watch your condition.  Will get help right away if you are not doing well or get worse. Document Released: 12/10/2007 Document Revised: 12/23/2011 Document Reviewed: 08/22/2011 [facility] Patient Information 2015 [place], [place]. This information is not intended to replace advice given to you by your health care provider. Make sure you discuss any questions you have with your health care provider.

## 2014-12-21 NOTE — ED Notes (Addendum)
Intermittent black outs for past month with frequent falls. No LOC, it is long periods of time where she does not remember what has happened even though she is awake and functioning with family. Last fall on Saturday, states hit forehead which started a migraine. Called PCP office today and they told her to come to ED. States she has not eaten in 6 days. Ambulatory and arrived from EMS by wheelchair. History of seizures.

## 2014-12-21 NOTE — ED Provider Notes (Signed)
CSN: [id number]     Arrival date & time 12/21/14  1511 History   First MD Initiated Contact with Patient 12/21/14 1517     Chief Complaint  Patient presents with  . Headache   Kelli Navarro is a 64 y.o. 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 She reports that currently her migraine is not bad and she rates it at a 3 out of 10 generalized to her head. She reports due to this migraine she has had decreased appetite and has not eaten much in the past 6 days. She reports she has been drinking fluids however. Patient reports she has fallen 4-5 times in the past 2 months. She reports none of these falls have been witnessed. She is unable to identify prodromal or precipitating factors to these falls. She reports that 6 days ago she was bending over to put on her shoes when she passed out and hit her forehead on some cabinets. This again was unwitnessed. Patient also reports she's had frequent periods of "blackouts." She reports that she has periods were days where she does not remember the events of the day, however her family and partner with her and noted that she is alert and awake and acting appropriately. The patient's friend reports that she has not witnessed any seizure activity in the past 8 years and has never seen her fall. The patient denies fevers, chills, changes to her vision, numbness, tingling, weakness, chest pain, shortness of breath, palpitations, dizziness, rashes, abdominal pain, vomiting, new back pain, or ear pain.  (Consider location/radiation/quality/duration/timing/severity/associated sxs/prior Treatment) HPI  Past Medical History  Diagnosis Date  . [facility] spotted fever   . Seizures   . Chronic back pain    History reviewed. No pertinent past surgical history. History  reviewed. No pertinent family history. History  Substance Use Topics  . Smoking status: Never Smoker   . Smokeless tobacco: Not on file  . Alcohol Use: No   OB History    No data available     Review of Systems  Constitutional: Positive for appetite change. Negative for fever and chills.  HENT: Negative for congestion, ear pain, facial swelling, sore throat and trouble swallowing.   Eyes: Negative for photophobia, pain and visual disturbance.  Respiratory: Negative for cough, shortness of breath and wheezing.   Cardiovascular: Negative for chest pain and palpitations.  Gastrointestinal: Positive for nausea. Negative for vomiting, abdominal pain and diarrhea.  Genitourinary: Negative for dysuria, frequency, hematuria and difficulty urinating.  Musculoskeletal: Negative for back pain and neck pain.  Skin: Negative for rash.  Neurological: Positive for syncope, light-headedness and headaches. Negative for dizziness, weakness and numbness.  Psychiatric/Behavioral: Negative for suicidal ideas, hallucinations and sleep disturbance.      Allergies  Review of patient's allergies indicates no known allergies.  Home Medications   Prior to Admission medications   Medication Sig Start Date End Date Taking? Authorizing Provider  albuterol (PROVENTIL HFA;VENTOLIN HFA) 108 (90 BASE) MCG/ACT inhaler Inhale 2 puffs into the lungs every 6 (six) hours as needed for wheezing.   Yes Historical Provider, MD  amphetamine-dextroamphetamine (ADDERALL) 20 MG tablet Take 20 mg by mouth every 6 (six) hours.   Yes Historical Provider, MD  clonazePAM (KLONOPIN) 1 MG tablet Take 1 mg by mouth every 8 (eight)  hours.   Yes Historical Provider, MD  HYDROcodone-acetaminophen (NORCO) 10-325 MG per tablet Take 1 tablet by mouth every 6 (six) hours as needed for moderate pain.  12/18/14  Yes Historical Provider, MD  metoprolol succinate (TOPROL-XL) 50 MG 24 hr tablet Take 50 mg by mouth daily. Take with or  immediately following a meal.   Yes Historical Provider, MD  oxymorphone (OPANA ER) 20 MG 12 hr tablet Take 20 mg by mouth every 12 (twelve) hours.   Yes Historical Provider, MD  topiramate (TOPAMAX) 200 MG tablet Take 400 mg by mouth 2 (two) times daily.  10/18/14  Yes Historical Provider, MD   BP 101/62 mmHg  Pulse 64  Temp(Src) 98.4 F (36.9 C) (Oral)  Resp 18  SpO2 100% Physical Exam  Constitutional: She is oriented to person, place, and time. She appears well-developed and well-nourished. No distress.  Nontoxic appearing.  HENT:  Head: Normocephalic and atraumatic.  Right Ear: External ear normal.  Left Ear: External ear normal.  Nose: Nose normal.  Mouth/Throat: Oropharynx is clear and moist. No oropharyngeal exudate.  No evidence of facial or head injury due to fall. No head ecchymosis or edema. No temporal edema or tenderness. Bilateral tympanic membranes are pearly-gray without erythema or loss of landmarks.   Eyes: Conjunctivae and EOM are normal. Pupils are equal, round, and reactive to light. Right eye exhibits no discharge. Left eye exhibits no discharge.  Neck: Normal range of motion. Neck supple. No JVD present. No tracheal deviation present.  Cardiovascular: Normal rate, regular rhythm, normal heart sounds and intact distal pulses.  Exam reveals no gallop and no friction rub.   No murmur heard. Bilateral radial, posterior tibialis and dorsalis pedis pulses are intact.    Pulmonary/Chest: Effort normal and breath sounds normal. No respiratory distress. She has no wheezes. She has no rales.  Abdominal: Soft. Bowel sounds are normal. She exhibits no distension. There is no tenderness.  Musculoskeletal: Normal range of motion. She exhibits no edema or tenderness.  Patient is spontaneously moving all extremities in a coordinated fashion exhibiting good strength. Strength is 5 out of 5 in her bilateral upper and lower extremities. No lower extremity edema or tenderness.   Lymphadenopathy:    She has no cervical adenopathy.  Neurological: She is alert and oriented to person, place, and time. No cranial nerve deficit. Coordination normal.  Patient is alert and oriented 3. Cranial nerves are intact. Finger to nose intact, however patient has slowed response. No pronator drift. Heel-to-shin intact bilaterally. Sensation intact her bilateral upper and lower extremities.  Skin: Skin is warm and dry. No rash noted. She is not diaphoretic. No erythema. No pallor.  Psychiatric: She has a normal mood and affect. Her speech is normal and behavior is normal. She expresses no homicidal and no suicidal ideation.  Nursing note and vitals reviewed.   ED Course  Procedures (including critical care time) Labs Review Labs Reviewed  CBC WITH DIFFERENTIAL/PLATELET - Abnormal; Notable for the following:    WBC 3.2 (*)    Neutro Abs 1.4 (*)    All other components within normal limits  COMPREHENSIVE METABOLIC PANEL - Abnormal; Notable for the following:    Calcium 8.4 (*)    Total Protein 5.9 (*)    Albumin 3.1 (*)    AST 13 (*)    ALT 9 (*)    All other components within normal limits  AMMONIA  URINALYSIS, ROUTINE W REFLEX MICROSCOPIC (NOT AT [facility])  URINE RAPID DRUG  SCREEN, HOSP PERFORMED    Imaging Review Ct Head Wo Contrast  12/21/2014   CLINICAL DATA:  Intermittent syncopal episodes over the last month with frequent falls. Last episode 5 days ago. History of seizures. Initial encounter.  EXAM: CT HEAD WITHOUT CONTRAST  TECHNIQUE: Contiguous axial images were obtained from the base of the skull through the vertex without intravenous contrast.  COMPARISON:  Head CT 10/17/2012.  FINDINGS: There is no evidence of acute intracranial hemorrhage, mass lesion, brain edema or extra-axial fluid collection. The ventricles and subarachnoid spaces are mildly prominent but stable. There is no CT evidence of acute cortical infarction. There is stable mild periventricular white matter  disease.  The visualized paranasal sinuses, mastoid air cells and middle ears are clear. The calvarium is intact.  IMPRESSION: Stable head CT demonstrating mild atrophy and chronic microvascular ischemic changes. No acute intracranial findings.   Electronically Signed   By: Richardean Sale M.D.   On: 12/21/2014 16:44     EKG Interpretation   Date/Time:  Thursday December 21 2014 18:16:21 EDT Ventricular Rate:  64 PR Interval:  178 QRS Duration: 96 QT Interval:  440 QTC Calculation: 454 R Axis:   -33 Text Interpretation:  Sinus rhythm Inferior infarct, old Baseline wander  in lead(s) I No significant change since last tracing Confirmed by  JACUBOWITZ  MD, SAM [phone]) on 12/21/2014 6:19:33 PM      Filed Vitals:   12/21/14 1528 12/21/14 1530 12/21/14 1600 12/21/14 1735  BP: 121/71 118/86 104/60 101/62  Pulse: 65 67 65 64  Temp: 98.4 F (36.9 C)     TempSrc: Oral     Resp: 18     SpO2: 98% 98% 98% 100%     MDM   Meds given in ED:  Medications  ondansetron (ZOFRAN-ODT) disintegrating tablet 4 mg (4 mg Oral Given 12/21/14 1814)    New Prescriptions   No medications on file    Final diagnoses:  Syncope and collapse   This is a 64 y.o. 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 She reports that currently her migraine is not bad and she rates it at a 3 out of 10 generalized to her head. She reports due to this migraine she has had decreased appetite and has not eaten much in the past 6 days. She reports she has been drinking fluids however. Patient reports she has fallen 4-5 times in the past 2 months. She reports none of these falls have been witnessed. She is unable to identify prodromal or precipitating factors to these falls. On exam she is afebrile and non-toxic appearing. She has no  focal neurological deficits. No evidence of head trauma on exam.  Plan is for CT head and blood work.  Patient care is signed out to [employer], PA-C at shift change. Likely disposition home if normal work up with close follow up with her PCP and neurologist.     Waynetta Pean, PA-C 12/21/14 [place], MD 12/22/14 [phone]

## 2014-12-27 ENCOUNTER — Encounter: Payer: Self-pay | Admitting: *Deleted

## 2014-12-27 ENCOUNTER — Telehealth: Payer: Self-pay | Admitting: *Deleted

## 2014-12-27 NOTE — Telephone Encounter (Signed)
called for fm hx & status... 

## 2014-12-27 NOTE — Telephone Encounter (Signed)
CALLED FOR MEDICAL RECORDS... SPOKE TO [facility], FAXING TO [facility] FAX MACHINE.Marland Kitchen

## 2014-12-29 ENCOUNTER — Ambulatory Visit (INDEPENDENT_AMBULATORY_CARE_PROVIDER_SITE_OTHER): Payer: Managed Care, Other (non HMO) | Admitting: Internal Medicine

## 2014-12-29 ENCOUNTER — Encounter: Payer: Self-pay | Admitting: Internal Medicine

## 2014-12-29 VITALS — BP 119/78 | HR 64 | Ht 67.0 in | Wt 152.0 lb

## 2014-12-29 DIAGNOSIS — R55 Syncope and collapse: Secondary | ICD-10-CM

## 2014-12-29 NOTE — Patient Instructions (Signed)
Medication Instructions:  Your physician recommends that you continue on your current medications as directed. Please refer to the Current Medication list given to you today.  Labwork: None ordered  Testing/Procedures: Your physician has requested that you have an echocardiogram. Echocardiography is a painless test that uses sound waves to create images of your heart. It provides your doctor with information about the size and shape of your heart and how well your heart's chambers and valves are working. This procedure takes approximately one hour. There are no restrictions for this procedure.  Follow-Up: No follow up is needed at this time with Dr. Caryl Comes.  He will see you on an as needed basis.  Thank you for choosing [facility]!!

## 2014-12-29 NOTE — Progress Notes (Signed)
ELECTROPHYSIOLOGY CONSULT NOTE  Patient ID: Kelli Navarro, MRN: [id number], DOB/AGE: 64-Jul-1952 64 y.o. Admit date: (Not on file) Date of Consult: 12/29/2014  Primary Physician: Rubie Maid, MD Primary Cardiologist: new Chief Complaint: syncoope   HPI Kelli Navarro is a 64 y.o. female  With a very complicated neuropsychiatric history. She has a history of chronic pain and has had surgery. She has a seizure disorder and has been intolerant of multiple medications. She takes chronic narcotics and 2 different seizure medications. She has had a history of recurrent falls down the stairs. She was referred from the emergency room After she presented a number of days following a spell where she fell and hit her head. She had headaches since that time.  She had a spell where she fell off the deck. It is not clear whether she lost consciousness or not. There is a spell where she awakened on the floor at the bottom of the stairs with a "knot on her head". (See above). She describes 2 episodes of syncope after she let out the dogs. On one occasion she remembers opening the door and the next day she remembers is awakening on the floor by the door an  hour later. The other episode with the dogs were similar.  She has no history of orthostatic intolerance shower intolerance or heat intolerance.  She is on chronic narcotics. I should note that the spells of all occurred in the last year or so and she has been on chronic narcotics in the last 2 years with a recent addition of her narcotic about a urinalysis half ago. She has a history of a seizure disorder has been intolerant of multiple antiepileptics.  In the first moments of our discussion she tells me that she "has a control issue".   Outside records from [facility] ENT and blood work were reviewed and were normal   Past Medical History  Diagnosis Date  . [facility] spotted fever   . Seizures   . Chronic back pain   .  Depression   . Hypertension       Surgical History:  Past Surgical History  Procedure Laterality Date  . None       Home Meds: Prior to Admission medications   Medication Sig Start Date End Date Taking? Authorizing Provider  albuterol (PROVENTIL HFA;VENTOLIN HFA) 108 (90 BASE) MCG/ACT inhaler Inhale 2 puffs into the lungs every 6 (six) hours as needed for wheezing.   Yes Historical Provider, MD  amphetamine-dextroamphetamine (ADDERALL) 20 MG tablet Take 20 mg by mouth every 6 (six) hours.   Yes Historical Provider, MD  aspirin 81 MG tablet Take 81 mg by mouth daily.   Yes Historical Provider, MD  clonazePAM (KLONOPIN) 1 MG tablet Take 1 mg by mouth every 8 (eight) hours.   Yes Historical Provider, MD  HYDROcodone-acetaminophen (NORCO) 10-325 MG per tablet Take 1 tablet by mouth every 6 (six) hours as needed for moderate pain.  12/18/14  Yes Historical Provider, MD  metoprolol succinate (TOPROL-XL) 50 MG 24 hr tablet Take 50 mg by mouth daily. Take with or immediately following a meal.   Yes Historical Provider, MD  oxymorphone (OPANA ER) 20 MG 12 hr tablet Take 20 mg by mouth every 12 (twelve) hours.   Yes Historical Provider, MD  topiramate (TOPAMAX) 200 MG tablet Take 400 mg by mouth 2 (two) times daily.  10/18/14  Yes Historical Provider, MD      Allergies:  Allergies  Allergen Reactions  .  Aptiom [Eslicarbazepine]   . Bupropion   . Dilantin [Phenytoin Sodium Extended]   . Diphenhydramine   . Divalproex Sodium   . Keppra [Levetiracetam]   . Lamictal [Lamotrigine]   . Lyrica [Pregabalin]   . Morphine And Related     At high doses  . Prednisone     History   Social History  . Marital Status: Single    Spouse Name: N/A  . Number of Children: N/A  . Years of Education: N/A   Occupational History  . Not on file.   Social History Main Topics  . Smoking status: Never Smoker   . Smokeless tobacco: Not on file  . Alcohol Use: No  . Drug Use: No  . Sexual Activity: Not  on file   Other Topics Concern  . Not on file   Social History Narrative     Family History  Problem Relation Age of Onset  . Multiple sclerosis Mother   . Heart Problems Mother     by-pass  . Heart Problems Sister     pacemaker     ROS:  Please see the history of present illness.     All other systems reviewed and negative.    Physical Exam:   Blood pressure 119/78, pulse 64, height 5\' 7"  (1.702 m), weight 152 lb (68.947 kg). General: Well developed, well nourished female in no acute distress. Head: Normocephalic, atraumatic, sclera non-icteric, no xanthomas, nares are without discharge. EENT: normal Lymph Nodes:  none Back: without scoliosis/kyphosis , no CVA tendersness Neck: Negative for carotid bruits. JVD not elevated. Lungs: Clear bilaterally to auscultation without wheezes, rales, or rhonchi. Breathing is unlabored. Heart: RRR with S1 S2.  2/6 systolic murmur , rubs, or gallops appreciated. Abdomen: Soft, non-tender, non-distended with normoactive bowel sounds. No hepatomegaly. No rebound/guarding. No obvious abdominal masses. Abdominal binder in place (back brace) Msk:  Strength and tone appear normal for age. Extremities: No clubbing or cyanosis. No  edema.  Distal pedal pulses are 2+ and equal bilaterally. Skin: Warm and Dry Neuro: Alert and oriented X 3. CN III-XII intact Grossly normal sensory and motor function . Psych:  Responds to questions appropriately with a normal affect.      Labs: Cardiac Enzymes No results for input(s): CKTOTAL, CKMB, TROPONINI in the last 72 hours. CBC Lab Results  Component Value Date   WBC 3.2* 12/21/2014   HGB 12.3 12/21/2014   HCT 39.3 12/21/2014   MCV 88.7 12/21/2014   PLT 263 12/21/2014   PROTIME: No results for input(s): LABPROT, INR in the last 72 hours. Chemistry No results for input(s): NA, K, CL, CO2, BUN, CREATININE, CALCIUM, PROT, BILITOT, ALKPHOS, ALT, AST, GLUCOSE in the last 168 hours.  Invalid input(s):  LABALBU Lipids No results found for: CHOL, HDL, LDLCALC, TRIG BNP No results found for: PROBNP Thyroid Function Tests: No results for input(s): TSH, T4TOTAL, T3FREE, THYROIDAB in the last 72 hours.  Invalid input(s): FREET3    Miscellaneous No results found for: DDIMER  Radiology/Studies:  Ct Head Wo Contrast  12/21/2014   CLINICAL DATA:  Intermittent syncopal episodes over the last month with frequent falls. Last episode 5 days ago. History of seizures. Initial encounter.  EXAM: CT HEAD WITHOUT CONTRAST  TECHNIQUE: Contiguous axial images were obtained from the base of the skull through the vertex without intravenous contrast.  COMPARISON:  Head CT 10/17/2012.  FINDINGS: There is no evidence of acute intracranial hemorrhage, mass lesion, brain edema or extra-axial fluid collection. The ventricles  and subarachnoid spaces are mildly prominent but stable. There is no CT evidence of acute cortical infarction. There is stable mild periventricular white matter disease.  The visualized paranasal sinuses, mastoid air cells and middle ears are clear. The calvarium is intact.  IMPRESSION: Stable head CT demonstrating mild atrophy and chronic microvascular ischemic changes. No acute intracranial findings.   Electronically Signed   By: Richardean Sale M.D.   On: 12/21/2014 16:44    EKG: Sinus rhythm at 64 Intervals 17/09/43 man   Assessment and Plan:  Syncope  Chronic pain  Seizure disorder  "Control issues"  The patient has had a series of spells that are quite unusual. She has a history of recurrent falling down the stairs without loss of consciousness. The episode of falling over the deck may or may not been associated with loss of consciousness. 2 episodes with the dogs were associated with prolonged loss of consciousness greater than an hour suggesting a neuropsychiatric explanation  Her ECG is normal and there is no history of structural heart disease. I think the likelihood of a cardiac  explanation for this is very small. There is some possibility that it could be vasomotor. Against this is the prolonged nature of the episodes described above. There also few other symptoms to suggest vasomotor instability orthostatic intolerance.  I have suggested that we get an echocardiogram to confirm normal LV function. I should note that the ECG indicating outpatient records is consistent with an inferior wall MI but stated this was 09/09/2012. However, tracings from home 2015 and 2016 failed to just finished a event. It is certainly true that inferior wall MI can't disappear electrocardiographically so we important to exclude this by echocardiogram.  I suggested the role of an implantable loop recorder for recurrent syncope if the echocardiogram is normal. The patient and her partner are not at this juncture inclined to do so.   Virl Axe

## 2015-01-05 ENCOUNTER — Other Ambulatory Visit (HOSPITAL_COMMUNITY): Payer: Medicare Other

## 2015-01-12 ENCOUNTER — Ambulatory Visit (HOSPITAL_COMMUNITY): Payer: Managed Care, Other (non HMO) | Attending: Cardiovascular Disease

## 2015-01-12 ENCOUNTER — Other Ambulatory Visit: Payer: Self-pay

## 2015-01-12 DIAGNOSIS — R55 Syncope and collapse: Secondary | ICD-10-CM

## 2015-01-22 ENCOUNTER — Encounter: Payer: Self-pay | Admitting: Internal Medicine

## 2015-01-22 NOTE — Telephone Encounter (Signed)
This encounter was created in error - please disregard.

## 2015-01-22 NOTE — Telephone Encounter (Signed)
New Message °

## 2015-01-22 NOTE — Telephone Encounter (Signed)
New Message    Pt is returning you call, please call pt

## 2016-12-31 ENCOUNTER — Encounter (HOSPITAL_COMMUNITY): Payer: Self-pay | Admitting: *Deleted

## 2016-12-31 ENCOUNTER — Emergency Department (HOSPITAL_COMMUNITY)
Admission: EM | Admit: 2016-12-31 | Discharge: 2017-01-01 | Disposition: A | Discharge status: Home / Self Care | Payer: Medicare HMO | Attending: Emergency Medicine | Admitting: Emergency Medicine

## 2016-12-31 DIAGNOSIS — E876 Hypokalemia: Secondary | ICD-10-CM | POA: Insufficient documentation

## 2016-12-31 DIAGNOSIS — Z79899 Other long term (current) drug therapy: Secondary | ICD-10-CM | POA: Insufficient documentation

## 2016-12-31 DIAGNOSIS — R112 Nausea with vomiting, unspecified: Secondary | ICD-10-CM | POA: Diagnosis present

## 2016-12-31 DIAGNOSIS — F4329 Adjustment disorder with other symptoms: Secondary | ICD-10-CM | POA: Diagnosis present

## 2016-12-31 DIAGNOSIS — R51 Headache: Secondary | ICD-10-CM | POA: Diagnosis not present

## 2016-12-31 DIAGNOSIS — R569 Unspecified convulsions: Secondary | ICD-10-CM | POA: Insufficient documentation

## 2016-12-31 DIAGNOSIS — F1123 Opioid dependence with withdrawal: Secondary | ICD-10-CM

## 2016-12-31 DIAGNOSIS — F13930 Sedative, hypnotic or anxiolytic use, unspecified with withdrawal, uncomplicated: Secondary | ICD-10-CM

## 2016-12-31 DIAGNOSIS — I10 Essential (primary) hypertension: Secondary | ICD-10-CM | POA: Diagnosis not present

## 2016-12-31 DIAGNOSIS — G894 Chronic pain syndrome: Secondary | ICD-10-CM

## 2016-12-31 DIAGNOSIS — R45851 Suicidal ideations: Secondary | ICD-10-CM | POA: Insufficient documentation

## 2016-12-31 DIAGNOSIS — F13230 Sedative, hypnotic or anxiolytic dependence with withdrawal, uncomplicated: Secondary | ICD-10-CM | POA: Diagnosis not present

## 2016-12-31 DIAGNOSIS — F1193 Opioid use, unspecified with withdrawal: Secondary | ICD-10-CM

## 2016-12-31 LAB — CBC WITH DIFFERENTIAL/PLATELET
Basophils Absolute: 0 10*3/uL (ref 0.0–0.1)
Basophils Relative: 0 %
Eosinophils Absolute: 0 10*3/uL (ref 0.0–0.7)
Eosinophils Relative: 0 %
HCT: 42.4 % (ref 36.0–46.0)
Hemoglobin: 14 g/dL (ref 12.0–15.0)
LYMPHS ABS: 1.3 10*3/uL (ref 0.7–4.0)
Lymphocytes Relative: 25 %
MCH: 27.4 pg (ref 26.0–34.0)
MCHC: 33 g/dL (ref 30.0–36.0)
MCV: 83 fL (ref 78.0–100.0)
Monocytes Absolute: 0.5 10*3/uL (ref 0.1–1.0)
Monocytes Relative: 10 %
Neutro Abs: 3.2 10*3/uL (ref 1.7–7.7)
Neutrophils Relative %: 65 %
PLATELETS: 326 10*3/uL (ref 150–400)
RBC: 5.11 MIL/uL (ref 3.87–5.11)
RDW: 12.4 % (ref 11.5–15.5)
WBC: 5 10*3/uL (ref 4.0–10.5)

## 2016-12-31 LAB — RAPID URINE DRUG SCREEN, HOSP PERFORMED
Amphetamines: NOT DETECTED
BENZODIAZEPINES: POSITIVE — AB
Barbiturates: NOT DETECTED
COCAINE: NOT DETECTED
Opiates: NOT DETECTED
Tetrahydrocannabinol: POSITIVE — AB

## 2016-12-31 LAB — COMPREHENSIVE METABOLIC PANEL
ALT: 21 U/L (ref 14–54)
ANION GAP: 10 (ref 5–15)
AST: 33 U/L (ref 15–41)
Albumin: 3.7 g/dL (ref 3.5–5.0)
Alkaline Phosphatase: 91 U/L (ref 38–126)
BUN: 13 mg/dL (ref 6–20)
CHLORIDE: 103 mmol/L (ref 101–111)
CO2: 26 mmol/L (ref 22–32)
Calcium: 9.3 mg/dL (ref 8.9–10.3)
Creatinine, Ser: 0.72 mg/dL (ref 0.44–1.00)
GFR calc Af Amer: >60 mL/min (ref >60)
GFR calc non Af Amer: >60 mL/min (ref >60)
Glucose, Bld: 96 mg/dL (ref 65–99)
Potassium: 3 mmol/L — ABNORMAL LOW (ref 3.5–5.1)
Sodium: 139 mmol/L (ref 135–145)
Total Bilirubin: 0.5 mg/dL (ref 0.3–1.2)
Total Protein: 8.3 g/dL — ABNORMAL HIGH (ref 6.5–8.1)

## 2016-12-31 LAB — ETHANOL: Alcohol, Ethyl (B): <5 mg/dL (ref <5)

## 2016-12-31 LAB — LIPASE, BLOOD: Lipase: 36 U/L (ref 11–51)

## 2016-12-31 MED ORDER — ALBUTEROL SULFATE HFA 108 (90 BASE) MCG/ACT IN AERS: 2.0000 | INHALATION_SPRAY | Freq: Four times a day (QID) | RESPIRATORY_TRACT | Status: DC | PRN

## 2016-12-31 MED ORDER — SODIUM CHLORIDE 0.9 % IV BOLUS (SEPSIS)
1000.0000 mL | Freq: Once | INTRAVENOUS | Status: AC
  Administered 2016-12-31: 1000 mL via INTRAVENOUS

## 2016-12-31 MED ORDER — ONDANSETRON HCL 4 MG/2ML IJ SOLN
INTRAMUSCULAR | Status: AC
  Filled 2016-12-31: qty 2

## 2016-12-31 MED ORDER — ACETAMINOPHEN 325 MG PO TABS: 650.0000 mg | ORAL_TABLET | ORAL | Status: DC | PRN

## 2016-12-31 MED ORDER — ASPIRIN 81 MG PO CHEW
81.0000 mg | CHEWABLE_TABLET | Freq: Every day | ORAL | Status: DC
  Administered 2017-01-01: 81 mg via ORAL
  Filled 2016-12-31 (×2): qty 1

## 2016-12-31 MED ORDER — AMPHETAMINE-DEXTROAMPHETAMINE 20 MG PO TABS
20.0000 mg | ORAL_TABLET | Freq: Three times a day (TID) | ORAL | Status: DC
  Administered 2017-01-01: 20 mg via ORAL
  Filled 2016-12-31 (×2): qty 1

## 2016-12-31 MED ORDER — METHOCARBAMOL 500 MG PO TABS: 500.0000 mg | ORAL_TABLET | Freq: Three times a day (TID) | ORAL | Status: DC | PRN

## 2016-12-31 MED ORDER — TOPIRAMATE 100 MG PO TABS
200.0000 mg | ORAL_TABLET | Freq: Two times a day (BID) | ORAL | Status: DC
  Filled 2016-12-31: qty 2

## 2016-12-31 MED ORDER — DICYCLOMINE HCL 20 MG PO TABS: 20.0000 mg | ORAL_TABLET | Freq: Four times a day (QID) | ORAL | Status: DC | PRN

## 2016-12-31 MED ORDER — HYDROMORPHONE HCL 1 MG/ML IJ SOLN
0.5000 mg | Freq: Once | INTRAMUSCULAR | Status: AC
  Administered 2016-12-31: 0.5 mg via INTRAVENOUS
  Filled 2016-12-31: qty 0.5

## 2016-12-31 MED ORDER — LORAZEPAM 2 MG/ML IJ SOLN
1.0000 mg | Freq: Once | INTRAMUSCULAR | Status: AC
  Administered 2016-12-31: 1 mg via INTRAVENOUS
  Filled 2016-12-31: qty 1

## 2016-12-31 MED ORDER — LOPERAMIDE HCL 2 MG PO CAPS: 2.0000 mg | ORAL_CAPSULE | ORAL | Status: DC | PRN

## 2016-12-31 MED ORDER — ONDANSETRON HCL 4 MG/2ML IJ SOLN
4.0000 mg | Freq: Once | INTRAMUSCULAR | Status: AC
  Administered 2016-12-31: 4 mg via INTRAVENOUS
  Filled 2016-12-31: qty 2

## 2016-12-31 MED ORDER — OXYCODONE HCL 5 MG PO TABS
15.0000 mg | ORAL_TABLET | Freq: Three times a day (TID) | ORAL | Status: DC | PRN
  Administered 2017-01-01: 15 mg via ORAL
  Filled 2016-12-31: qty 3

## 2016-12-31 MED ORDER — POTASSIUM CHLORIDE CRYS ER 20 MEQ PO TBCR
40.0000 meq | EXTENDED_RELEASE_TABLET | Freq: Once | ORAL | Status: AC
  Administered 2016-12-31: 40 meq via ORAL
  Filled 2016-12-31: qty 2

## 2016-12-31 MED ORDER — HYDROXYZINE HCL 25 MG PO TABS: 25.0000 mg | ORAL_TABLET | Freq: Four times a day (QID) | ORAL | Status: DC | PRN

## 2016-12-31 MED ORDER — CLONAZEPAM 0.5 MG PO TABS
1.0000 mg | ORAL_TABLET | Freq: Two times a day (BID) | ORAL | Status: DC
  Administered 2017-01-01: 1 mg via ORAL
  Filled 2016-12-31 (×2): qty 2

## 2016-12-31 MED ORDER — IBUPROFEN 200 MG PO TABS: 600.0000 mg | ORAL_TABLET | Freq: Three times a day (TID) | ORAL | Status: DC | PRN

## 2016-12-31 MED ORDER — NAPROXEN 500 MG PO TABS: 500.0000 mg | ORAL_TABLET | Freq: Two times a day (BID) | ORAL | Status: DC | PRN

## 2016-12-31 MED ORDER — ONDANSETRON 4 MG PO TBDP
4.0000 mg | ORAL_TABLET | Freq: Four times a day (QID) | ORAL | Status: DC | PRN
  Administered 2017-01-01: 4 mg via ORAL
  Filled 2016-12-31: qty 1

## 2016-12-31 MED ORDER — METOPROLOL SUCCINATE ER 50 MG PO TB24
50.0000 mg | ORAL_TABLET | Freq: Every day | ORAL | Status: DC
  Filled 2016-12-31: qty 1

## 2016-12-31 NOTE — ED Notes (Signed)
Pt vomited after po K+ at this time. Pt given zofran

## 2016-12-31 NOTE — BH Assessment (Addendum)
Tele Assessment Note   Kelli Navarro is an 66 y.o. female.  -Clinician reviewed note by Carlisle Cater, PA.  Patient with history of chronic pain, depression presents with complaint of worsening generalized pain, headache, nausea and vomiting, seizures since self discontinuing all of her medications (including narcotic and benzodiazepine) at home 5 days ago. Patient states that she is tired of "living like this" and she doesn't agree with aspects of her neurologic care from her neurologist. Vomiting was nonbloody nonbilious. Patient did not have a seizure until being on the waiting room. This was described as 1-2 minutes of loss of consciousness, shaking, with confusion afterwards. This was typical for her seizures which she states she has 1-2 times a week. Patient states that she has been in bed for the past 5 years or so. She is requesting referral to hospice so that she can die. She states that if we won't get her on hospice, "I will do it myself". Headache is generalized.  Patient is accompanied by her wife, who she gives permission to have present during assessment.  Patient said that she has taken herself off of all of her medications 5 days ago on (06/22).  She said that she was tired of being in pain.  Patient says that she has a neurologist with [facility] in [place] but that she is dissatisfied with neurological care.  Patient says that she was on several different medications and she has gotten little sleep and has eaten hardly anything since stopping the medications.  When asked if she still felt suicidal she says "Yes, I do."  Patient says "I know about medical things and where my arteries are in my arms and they are near the surface, it would b easy to do," when asked about a suicide plan.  Patient also said she had asked to be placed on Hospice "But they won't do it here."  Patient has had no previous suicide attempts.  Patient denies any HI or A/V hallucinations.  She did say she  became confused after a vivid dream yesterday "I could not tell reality from dream."  Patient denies any use of ETOH or other illicit drugs.  Patient went to see a psychiatrist on outpatient basis about 3-4 years ago.  She said that she has not had any inpatient care.  -Clinician discussed patient care with Patriciaann Clan, PA who recommends geropsych placement.  Clinician contacted Alecia Lemming, PA and informed him of disposition.  TTS to seek placement.  Diagnosis: MDD recurrent severe  Past Medical History:  Past Medical History:  Diagnosis Date  . Chronic back pain   . Depression   . Hypertension   . [facility] spotted fever   . Seizures ([place])     Past Surgical History:  Procedure Laterality Date  . none      Family History:  Family History  Problem Relation Age of Onset  . Multiple sclerosis Mother   . Heart Problems Mother        by-pass  . Heart Problems Sister        pacemaker    Social History:  reports that she has never smoked. She does not have any smokeless tobacco history on file. She reports that she does not drink alcohol or use drugs.  Additional Social History:  Alcohol / Drug Use Pain Medications: In the past was on dilaudid, oxycodone, hydromorphone.  Last usage was 12-27-16 when patient stopped using. Prescriptions: Was on clonopin, adderall and a beta blocker up  to 12/27/16 Over the Counter: None History of alcohol / drug use?: No history of alcohol / drug abuse  CIWA: CIWA-Ar BP: (!) 127/94 Pulse Rate: 67 COWS:    PATIENT STRENGTHS: (choose at least two) Ability for insight Average or above average intelligence Capable of independent living Communication skills Supportive family/friends  Allergies:  Allergies  Allergen Reactions  . Aptiom [Eslicarbazepine]   . Bupropion   . Dilantin [Phenytoin Sodium Extended]   . Diphenhydramine   . Divalproex Sodium   . Keppra [Levetiracetam]   . Lamictal [Lamotrigine]   . Lyrica [Pregabalin]    . Morphine And Related     At high doses  . Prednisone     Home Medications:  (Not in a hospital admission)  OB/GYN Status:  No LMP recorded. Patient is postmenopausal.  General Assessment Data Location of Assessment: [facility] TTS Assessment: In system Is this a Tele or Face-to-Face Assessment?: Face-to-Face Is this an Initial Assessment or a Re-assessment for this encounter?: Initial Assessment Marital status: Married Is patient pregnant?: No Pregnancy Status: No Living Arrangements: Spouse/significant other Can pt return to current living arrangement?: Yes Admission Status: Voluntary Is patient capable of signing voluntary admission?: Yes Referral Source: Self/Family/Friend (Pt's wife called EMS.) Insurance type: Airline pilot     Crisis Care Plan Living Arrangements: Spouse/significant other Name of Psychiatrist: None Name of Therapist: None  Education Status Is patient currently in school?: No Highest grade of school patient has completed: Some college  Risk to self with the past 6 months Suicidal Ideation: Yes-Currently Present Has patient been a risk to self within the past 6 months prior to admission? : No Suicidal Intent: Yes-Currently Present Has patient had any suicidal intent within the past 6 months prior to admission? : No Is patient at risk for suicide?: Yes Suicidal Plan?: Yes-Currently Present Has patient had any suicidal plan within the past 6 months prior to admission? : No Specify Current Suicidal Plan: "I could cut my arteries." Access to Means: Yes Specify Access to Suicidal Means: sharps What has been your use of drugs/alcohol within the last 12 months?: Denies Previous Attempts/Gestures: No How many times?: 0 Other Self Harm Risks: None Triggers for Past Attempts: None known Intentional Self Injurious Behavior: None Family Suicide History: No Recent stressful life event(s): Recent negative physical changes (Pt has chronic pain) Persecutory  voices/beliefs?: No Depression: Yes Depression Symptoms: Isolating, Despondent, Guilt, Tearfulness, Insomnia, Feeling worthless/self pity, Loss of interest in usual pleasures Substance abuse history and/or treatment for substance abuse?: No Suicide prevention information given to non-admitted patients: Not applicable  Risk to Others within the past 6 months Homicidal Ideation: No Does patient have any lifetime risk of violence toward others beyond the six months prior to admission? : No Thoughts of Harm to Others: No Current Homicidal Intent: No Current Homicidal Plan: No Access to Homicidal Means: No Identified Victim: No one History of harm to others?: No Assessment of Violence: None Noted Violent Behavior Description: None reported Does patient have access to weapons?: Yes (Comment) (Guns are secured, no ammo.) Criminal Charges Pending?: No Does patient have a court date: No Is patient on probation?: No  Psychosis Hallucinations: None noted Delusions: None noted  Mental Status Report Appearance/Hygiene: In hospital gown Eye Contact: Good Motor Activity: Freedom of movement Speech: Logical/coherent Level of Consciousness: Alert Mood: Depressed, Anxious, Despair, Helpless, Sad Affect: Anxious, Depressed, Sad Anxiety Level: Moderate Thought Processes: Coherent, Relevant Judgement: Unimpaired Orientation: Person, Place, Situation Obsessive Compulsive Thoughts/Behaviors: Minimal  Cognitive Functioning  Concentration: Poor Memory: Remote Intact, Recent Impaired IQ: Average Insight: Good Impulse Control: Poor Appetite: Poor Weight Loss:  (Nothing solid to eat since 06/21) Weight Gain: 0 Sleep: Decreased Total Hours of Sleep:  (<4H/D) Vegetative Symptoms: None  ADLScreening [facility] Assessment Services) Patient's cognitive ability adequate to safely complete daily activities?: Yes Patient able to express need for assistance with ADLs?: Yes Independently performs ADLs?:  Yes (appropriate for developmental age)  Prior Inpatient Therapy Prior Inpatient Therapy: No Prior Therapy Dates: None Prior Therapy Facilty/Provider(s): None Reason for Treatment: None  Prior Outpatient Therapy Prior Outpatient Therapy: Yes Prior Therapy Dates: 3-4 years ago Prior Therapy Facilty/Provider(s): Dr. Grandville Silos Reason for Treatment: med management Does patient have an ACCT team?: No Does patient have Intensive In-House Services?  : No Does patient have [facility] services? : No Does patient have P4CC services?: No  ADL Screening (condition at time of admission) Patient's cognitive ability adequate to safely complete daily activities?: Yes Patient able to express need for assistance with ADLs?: Yes Independently performs ADLs?: Yes (appropriate for developmental age)       Abuse/Neglect Assessment (Assessment to be complete while patient is alone) Physical Abuse: Denies Verbal Abuse: Denies Sexual Abuse: Denies Exploitation of patient/patient's resources: Denies Self-Neglect: Denies     Regulatory affairs officer (For Healthcare) Does Patient Have a Medical Advance Directive?: No Would patient like information on creating a medical advance directive?: No - Patient declined    Additional Information 1:1 In Past 12 Months?: No CIRT Risk: No Elopement Risk: No Does patient have medical clearance?: Yes     Disposition: [facility] recommends geropsychiatric placement.   Curlene Dolphin Ray 12/31/2016 9:35 PM

## 2016-12-31 NOTE — ED Triage Notes (Signed)
Patient is alert and oriented x4.  She is complaining of a headache that started yesterday.  Patient adds that she had been taking prescribed narcotics for migraines and decided to just stop them.  Patient is also showing signs of narcotic withdrawal.

## 2016-12-31 NOTE — ED Provider Notes (Signed)
[facility] DEPT Provider Note   CSN: [id number] Arrival date & time: 12/31/16  1341     History   Chief Complaint Chief Complaint  Patient presents with  . Migraine    HPI Kelli Navarro is a 66 y.o. female.  Patient with history of chronic pain, depression presents with complaint of worsening generalized pain, headache, nausea and vomiting, seizures since self discontinuing all of her medications (including narcotic and benzodiazepine) at home 5 days ago. Patient states that she is tired of "living like this" and she doesn't agree with aspects of her neurologic care from her neurologist. Vomiting was nonbloody nonbilious. Patient did not have a seizure until being in the waiting room. This was described as 1-2 minutes of loss of consciousness, shaking, with confusion afterwards. This was typical for her seizures which she states she has 1-2 times a week. Patient states that she has been in bed for the past 5 years or so. She is requesting referral to hospice so that she can die. She states that if we won't get her on hospice, "I will do it myself". Headache is generalized. No vision changes, weakness in arms or legs.      Past Medical History:  Diagnosis Date  . Chronic back pain   . Depression   . Hypertension   . [facility] spotted fever   . Seizures [facility])     Patient Active Problem List   Diagnosis Date Noted  . Seizure-like activity ([place]) 10/17/2012    Past Surgical History:  Procedure Laterality Date  . none      OB History    No data available       Home Medications    Prior to Admission medications   Medication Sig Start Date End Date Taking? Authorizing Provider  amphetamine-dextroamphetamine (ADDERALL) 20 MG tablet Take 1 tablet by mouth 3 times daily 11/18/16 01/01/17 Yes [provider]  aspirin 81 MG tablet Take 81 mg by mouth daily.   Yes [provider]  clonazePAM (KLONOPIN) 1 MG tablet Take 1 mg by mouth 2 (two) times  daily.  12/18/16 01/17/17 Yes [provider]  HYDROmorphone HCl (EXALGO) 8 MG T24A SR tablet **5/9**TAKE 1 TABLET BY MOUTH EVERY DAY 11/12/16  Yes [provider]  topiramate (TOPAMAX) 200 MG tablet TAKE 2 TABLETS (400 MG TOTAL) BY MOUTH 2 TIMES DAILY. 12/11/16  Yes [provider]  albuterol (PROVENTIL HFA;VENTOLIN HFA) 108 (90 BASE) MCG/ACT inhaler Inhale 2 puffs into the lungs every 6 (six) hours as needed for wheezing.    [provider]  metoprolol succinate (TOPROL-XL) 50 MG 24 hr tablet Take 50 mg by mouth daily. Take with or immediately following a meal.    [provider]  oxyCODONE (ROXICODONE) 15 MG immediate release tablet Take 15 mg by mouth every 8 (eight) hours as needed. for pain 12/12/16   [provider]    Family History Family History  Problem Relation Age of Onset  . Multiple sclerosis Mother   . Heart Problems Mother        by-pass  . Heart Problems Sister        pacemaker    Social History Social History  Substance Use Topics  . Smoking status: Never Smoker  . Smokeless tobacco: Not on file  . Alcohol use No     Allergies   Aptiom [eslicarbazepine]; Bupropion; Dilantin [phenytoin sodium extended]; Diphenhydramine; Divalproex sodium; Keppra [levetiracetam]; Lamictal [lamotrigine]; Lyrica [pregabalin]; Morphine and related; and Prednisone  Review of Systems Review of Systems  Constitutional: Positive for chills. Negative for fever.  HENT: Negative for congestion, dental problem, rhinorrhea and sinus pressure.   Eyes: Negative for photophobia, discharge, redness and visual disturbance.  Respiratory: Negative for shortness of breath.   Cardiovascular: Negative for chest pain.  Gastrointestinal: Positive for nausea and vomiting.  Musculoskeletal: Negative for gait problem, neck pain and neck stiffness.  Skin: Negative for rash.  Neurological: Positive for seizures and headaches. Negative for syncope, speech  difficulty, weakness, light-headedness and numbness.  Psychiatric/Behavioral: Positive for hallucinations and suicidal ideas. Negative for confusion. The patient is nervous/anxious.      Physical Exam Updated Vital Signs BP 126/83 (BP Location: Left Arm)   Pulse 89   Temp 98.3 F (36.8 C) (Oral)   Resp 14   Ht 5\' 5"  (1.651 m)   Wt 72.6 kg (160 lb)   SpO2 98%   BMI 26.63 kg/m   Physical Exam  Constitutional: She is oriented to person, place, and time. She appears well-developed and well-nourished.  HENT:  Head: Normocephalic and atraumatic.  Right Ear: Tympanic membrane, external ear and ear canal normal.  Left Ear: Tympanic membrane, external ear and ear canal normal.  Nose: Nose normal.  Mouth/Throat: Uvula is midline, oropharynx is clear and moist and mucous membranes are normal.  Eyes: Conjunctivae, EOM and lids are normal. Pupils are equal, round, and reactive to light. Right eye exhibits no discharge. Left eye exhibits no discharge. Right eye exhibits no nystagmus. Left eye exhibits no nystagmus.  Neck: Normal range of motion. Neck supple.  Cardiovascular: Normal rate, regular rhythm and normal heart sounds.  Exam reveals no friction rub.   No murmur heard. Pulmonary/Chest: Effort normal and breath sounds normal. No respiratory distress. She has no wheezes. She has no rales.  Abdominal: Soft. There is no tenderness.  Musculoskeletal:       Cervical back: She exhibits normal range of motion, no tenderness and no bony tenderness.  Neurological: She is alert and oriented to person, place, and time. She has normal strength and normal reflexes. No cranial nerve deficit or sensory deficit. She displays a negative Romberg sign. Coordination and gait normal. GCS eye subscore is 4. GCS verbal subscore is 5. GCS motor subscore is 6.  Skin: Skin is warm and dry.  Psychiatric: Her affect is labile. She exhibits a depressed mood.  Nursing note and vitals reviewed.    ED Treatments /  Results  Labs (all labs ordered are listed, but only abnormal results are displayed) Labs Reviewed  COMPREHENSIVE METABOLIC PANEL - Abnormal; Notable for the following:       Result Value   Potassium 3.0 (*)    Total Protein 8.3 (*)    All other components within normal limits  RAPID URINE DRUG SCREEN, HOSP PERFORMED - Abnormal; Notable for the following:    Benzodiazepines POSITIVE (*)    Tetrahydrocannabinol POSITIVE (*)    All other components within normal limits  CBC WITH DIFFERENTIAL/PLATELET  LIPASE, BLOOD  ETHANOL    Procedures Procedures (including critical care time)  Medications Ordered in ED Medications  ondansetron (ZOFRAN) 4 MG/2ML injection (not administered)  dicyclomine (BENTYL) tablet 20 mg (not administered)  hydrOXYzine (ATARAX/VISTARIL) tablet 25 mg (not administered)  loperamide (IMODIUM) capsule 2-4 mg (not administered)  methocarbamol (ROBAXIN) tablet 500 mg (not administered)  naproxen (NAPROSYN) tablet 500 mg (not administered)  ondansetron (ZOFRAN-ODT) disintegrating tablet 4 mg (not administered)  ibuprofen (ADVIL,MOTRIN) tablet 600 mg (not administered)  acetaminophen (TYLENOL) tablet 650 mg (not administered)  albuterol (PROVENTIL HFA;VENTOLIN HFA) 108 (90 Base) MCG/ACT inhaler 2 puff (not administered)  amphetamine-dextroamphetamine (ADDERALL) tablet 20 mg (not administered)  aspirin chewable tablet 81 mg (not administered)  clonazePAM (KLONOPIN) tablet 1 mg (not administered)  metoprolol succinate (TOPROL-XL) 24 hr tablet 50 mg (not administered)  topiramate (TOPAMAX) tablet 200 mg (not administered)  oxyCODONE (Oxy IR/ROXICODONE) immediate release tablet 15 mg (not administered)  LORazepam (ATIVAN) injection 1 mg (1 mg Intravenous Given 12/31/16 1747)  HYDROmorphone (DILAUDID) injection 0.5 mg (0.5 mg Intravenous Given 12/31/16 1747)  sodium chloride 0.9 % bolus 1,000 mL (0 mLs Intravenous Stopped 12/31/16 1847)  ondansetron (ZOFRAN) injection 4  mg (4 mg Intravenous Given 12/31/16 1747)  potassium chloride SA (K-DUR,KLOR-CON) CR tablet 40 mEq (40 mEq Oral Given 12/31/16 2146)  LORazepam (ATIVAN) injection 1 mg (1 mg Intravenous Given 12/31/16 2148)     Initial Impression / Assessment and Plan / ED Course  I have reviewed the triage vital signs and the nursing notes.  Pertinent labs & imaging results that were available during my care of the patient were reviewed by me and considered in my medical decision making (see chart for details).     Patient seen and examined. Work-up initiated. Medications ordered. Will treat pain and give Ativan to help relax patient prevent additional seizures.  Vital signs reviewed and are as follows: BP 126/83 (BP Location: Left Arm)   Pulse 89   Temp 98.3 F (36.8 C) (Oral)   Resp 14   Ht 5\' 5"  (1.651 m)   Wt 72.6 kg (160 lb)   SpO2 98%   BMI 26.63 kg/m   Given depression, patient voicing thoughts of self-harm, she will need to talk with psych when medical work-up completed.   Patient states that she was feeling even worse after treatment. I ordered additional Ativan.  Patient was seen by psych. They recommend inpatient treatment. I have placed holding orders and reordered patient's home meds including her chronic benzodiazepine and pain medication. I confirmed pain medication as prescribed and [place] substance reporting database.  Final Clinical Impressions(s) / ED Diagnoses   Final diagnoses:  Narcotic withdrawal ([place])  Benzodiazepine withdrawal without complication ([place])  Suicidal ideation  Hypokalemia  Chronic pain syndrome    New Prescriptions New Prescriptions   No medications on file     Carlisle Cater, Hershal Coria 12/31/16 2234    Margette Fast, MD 01/01/17 1130

## 2017-01-01 ENCOUNTER — Emergency Department (HOSPITAL_COMMUNITY): Payer: Medicare HMO

## 2017-01-01 DIAGNOSIS — F4329 Adjustment disorder with other symptoms: Secondary | ICD-10-CM

## 2017-01-01 MED ORDER — LORAZEPAM 2 MG/ML IJ SOLN
1.0000 mg | Freq: Once | INTRAMUSCULAR | Status: AC
  Administered 2017-01-01: 1 mg via INTRAVENOUS
  Filled 2017-01-01: qty 1

## 2017-01-01 NOTE — Consult Note (Signed)
[facility] Consult   Reason for Consult:  "I am tired of living with pain, please refer me to hospice.'' Referring Physician:  EDP Patient Identification: Kelli Navarro MRN:  [id number] Principal Diagnosis: Adjustment disorder with disturbance of emotion Diagnosis:   Patient Active Problem List   Diagnosis Date Noted  . Adjustment disorder with disturbance of emotion [F43.29] 01/01/2017  . Seizure-like activity ([place]) [R56.9] 10/17/2012    Total Time spent with patient: 45 minutes  Subjective:   Kelli Navarro is a 66 y.o. female patient who presents with generalized pain, headache nausea, vomiting and seizure.  HPI: Patient who report history of depression, Chronic pain, Seizure disorder who was brought to the hospital by her family due to worsening body pain, headache and nausea. She reports that she is not getting along well with her Neurologist regarding her medications and decided to wean herself off her medications 5 days ago because she was not upset that her neurologist was not listening to her. Patient reports that she had a seizure episode while she was in the waiting room. Today, patient denies saying she was feeling suicidal, states" I will not take my life, I just want the doctor here to refer me to a different pain doctor and neurologist.''. She denies feeling depressed, SI/HI, Psychotic or delusional.  Past Psychiatric History:   Risk to Self: Suicidal Ideation: Not Currently  Suicidal Intent: Not Currently  Is patient at risk for suicide?: denies Suicidal Plan?: Not Currently  Specify Current Suicidal Plan: denies Access to Means: denies Specify Access to Suicidal Means: none reported What has been your use of drugs/alcohol within the last 12 months?: Denies How many times?: 0 Other Self Harm Risks: None Triggers for Past Attempts: None known Intentional Self Injurious Behavior: None Risk to Others: Homicidal Ideation: No Thoughts of Harm to  Others: No Current Homicidal Intent: No Current Homicidal Plan: No Access to Homicidal Means: No Identified Victim: No one History of harm to others?: No Assessment of Violence: None Noted Violent Behavior Description: None reported Does patient have access to weapons?: Yes (Comment) (Guns are secured, no ammo.) Criminal Charges Pending?: No Does patient have a court date: No Prior Inpatient Therapy: Prior Inpatient Therapy: No Prior Therapy Dates: None Prior Therapy Facilty/Provider(s): None Reason for Treatment: None Prior Outpatient Therapy: Prior Outpatient Therapy: Yes Prior Therapy Dates: 3-4 years ago Prior Therapy Facilty/Provider(s): Dr. Grandville Silos Reason for Treatment: med management Does patient have an ACCT team?: No Does patient have Intensive In-House Services?  : No Does patient have [facility] services? : No Does patient have P4CC services?: No  Past Medical History:  Past Medical History:  Diagnosis Date  . Chronic back pain   . Depression   . Hypertension   . [facility] spotted fever   . Seizures ([place])     Past Surgical History:  Procedure Laterality Date  . none     Family History:  Family History  Problem Relation Age of Onset  . Multiple sclerosis Mother   . Heart Problems Mother        by-pass  . Heart Problems Sister        pacemaker   Family Psychiatric  History:  Social History:  History  Alcohol Use No     History  Drug Use No    Social History   Social History  . Marital status: Single    Spouse name: N/A  . Number of children: N/A  . Years of education: N/A  Social History Main Topics  . Smoking status: Never Smoker  . Smokeless tobacco: None  . Alcohol use No  . Drug use: No  . Sexual activity: Not Asked   Other Topics Concern  . None   Social History Narrative  . None   Additional Social History:    Allergies:   Allergies  Allergen Reactions  . Aptiom [Eslicarbazepine]   . Bupropion   . Dilantin  [Phenytoin Sodium Extended]   . Diphenhydramine   . Divalproex Sodium   . Keppra [Levetiracetam]   . Lamictal [Lamotrigine]   . Lyrica [Pregabalin]   . Morphine And Related     At high doses  . Prednisone     Labs:  Results for orders placed or performed during the hospital encounter of 12/31/16 (from the past 48 hour(s))  CBC with Differential/Platelet     Status: None   Collection Time: 12/31/16  5:44 PM  Result Value Ref Range   WBC 5.0 4.0 - 10.5 K/uL   RBC 5.11 3.87 - 5.11 MIL/uL   Hemoglobin 14.0 12.0 - 15.0 g/dL   HCT 42.4 36.0 - 46.0 %   MCV 83.0 78.0 - 100.0 fL   MCH 27.4 26.0 - 34.0 pg   MCHC 33.0 30.0 - 36.0 g/dL   RDW 12.4 11.5 - 15.5 %   Platelets 326 150 - 400 K/uL   Neutrophils Relative % 65 %   Neutro Abs 3.2 1.7 - 7.7 K/uL   Lymphocytes Relative 25 %   Lymphs Abs 1.3 0.7 - 4.0 K/uL   Monocytes Relative 10 %   Monocytes Absolute 0.5 0.1 - 1.0 K/uL   Eosinophils Relative 0 %   Eosinophils Absolute 0.0 0.0 - 0.7 K/uL   Basophils Relative 0 %   Basophils Absolute 0.0 0.0 - 0.1 K/uL  Comprehensive metabolic panel     Status: Abnormal   Collection Time: 12/31/16  5:44 PM  Result Value Ref Range   Sodium 139 135 - 145 mmol/L   Potassium 3.0 (L) 3.5 - 5.1 mmol/L   Chloride 103 101 - 111 mmol/L   CO2 26 22 - 32 mmol/L   Glucose, Bld 96 65 - 99 mg/dL   BUN 13 6 - 20 mg/dL   Creatinine, Ser 0.72 0.44 - 1.00 mg/dL   Calcium 9.3 8.9 - 10.3 mg/dL   Total Protein 8.3 (H) 6.5 - 8.1 g/dL   Albumin 3.7 3.5 - 5.0 g/dL   AST 33 15 - 41 U/L   ALT 21 14 - 54 U/L   Alkaline Phosphatase 91 38 - 126 U/L   Total Bilirubin 0.5 0.3 - 1.2 mg/dL   GFR calc non Af Amer >60 >60 mL/min   GFR calc Af Amer >60 >60 mL/min    Comment: (NOTE) The eGFR has been calculated using the CKD EPI equation. This calculation has not been validated in all clinical situations. eGFR's persistently <60 mL/min signify possible Chronic Kidney Disease.    Anion gap 10 5 - 15  Lipase, blood      Status: None   Collection Time: 12/31/16  5:44 PM  Result Value Ref Range   Lipase 36 11 - 51 U/L  Rapid urine drug screen (hospital performed)     Status: Abnormal   Collection Time: 12/31/16  5:44 PM  Result Value Ref Range   Opiates NONE DETECTED NONE DETECTED   Cocaine NONE DETECTED NONE DETECTED   Benzodiazepines POSITIVE (A) NONE DETECTED   Amphetamines NONE DETECTED NONE  DETECTED   Tetrahydrocannabinol POSITIVE (A) NONE DETECTED   Barbiturates NONE DETECTED NONE DETECTED    Comment:        DRUG SCREEN FOR MEDICAL PURPOSES ONLY.  IF CONFIRMATION IS NEEDED FOR ANY PURPOSE, NOTIFY LAB WITHIN 5 DAYS.        LOWEST DETECTABLE LIMITS FOR URINE DRUG SCREEN Drug Class       Cutoff (ng/mL) Amphetamine      1000 Barbiturate      200 Benzodiazepine   761 Tricyclics       607 Opiates          300 Cocaine          300 THC              50   Ethanol     Status: None   Collection Time: 12/31/16  5:45 PM  Result Value Ref Range   Alcohol, Ethyl (B) <5 <5 mg/dL    Comment:        LOWEST DETECTABLE LIMIT FOR SERUM ALCOHOL IS 5 mg/dL FOR MEDICAL PURPOSES ONLY     Current Facility-Administered Medications  Medication Dose Route Frequency Provider Last Rate Last Dose  . acetaminophen (TYLENOL) tablet 650 mg  650 mg Oral Q4H PRN Carlisle Cater, PA-C      . albuterol (PROVENTIL HFA;VENTOLIN HFA) 108 (90 Base) MCG/ACT inhaler 2 puff  2 puff Inhalation Q6H PRN Carlisle Cater, PA-C      . amphetamine-dextroamphetamine (ADDERALL) tablet 20 mg  20 mg Oral TID Carlisle Cater, PA-C   20 mg at 01/01/17 1051  . aspirin chewable tablet 81 mg  81 mg Oral Daily Carlisle Cater, PA-C   81 mg at 01/01/17 1051  . clonazePAM (KLONOPIN) tablet 1 mg  1 mg Oral BID Carlisle Cater, PA-C   1 mg at 01/01/17 1051  . dicyclomine (BENTYL) tablet 20 mg  20 mg Oral Q6H PRN Carlisle Cater, PA-C      . hydrOXYzine (ATARAX/VISTARIL) tablet 25 mg  25 mg Oral Q6H PRN Carlisle Cater, PA-C      . ibuprofen  (ADVIL,MOTRIN) tablet 600 mg  600 mg Oral Q8H PRN Carlisle Cater, PA-C      . loperamide (IMODIUM) capsule 2-4 mg  2-4 mg Oral PRN Carlisle Cater, PA-C      . methocarbamol (ROBAXIN) tablet 500 mg  500 mg Oral Q8H PRN Carlisle Cater, PA-C      . metoprolol succinate (TOPROL-XL) 24 hr tablet 50 mg  50 mg Oral Daily Geiple, Joshua, PA-C      . naproxen (NAPROSYN) tablet 500 mg  500 mg Oral BID PRN Carlisle Cater, PA-C      . ondansetron (ZOFRAN-ODT) disintegrating tablet 4 mg  4 mg Oral Q6H PRN Carlisle Cater, PA-C   4 mg at 01/01/17 0421  . oxyCODONE (Oxy IR/ROXICODONE) immediate release tablet 15 mg  15 mg Oral Q8H PRN Carlisle Cater, PA-C   15 mg at 01/01/17 0522  . topiramate (TOPAMAX) tablet 200 mg  200 mg Oral BID Carlisle Cater, PA-C       Current Outpatient Prescriptions  Medication Sig Dispense Refill  . amphetamine-dextroamphetamine (ADDERALL) 20 MG tablet Take 1 tablet by mouth 3 times daily    . aspirin 81 MG tablet Take 81 mg by mouth daily.    . clonazePAM (KLONOPIN) 1 MG tablet Take 1 mg by mouth 2 (two) times daily.     Marland Kitchen HYDROmorphone HCl (EXALGO) 8 MG T24A SR tablet **5/9**TAKE 1 TABLET BY  MOUTH EVERY DAY  0  . topiramate (TOPAMAX) 200 MG tablet TAKE 2 TABLETS (400 MG TOTAL) BY MOUTH 2 TIMES DAILY.  0  . albuterol (PROVENTIL HFA;VENTOLIN HFA) 108 (90 BASE) MCG/ACT inhaler Inhale 2 puffs into the lungs every 6 (six) hours as needed for wheezing.    . metoprolol succinate (TOPROL-XL) 50 MG 24 hr tablet Take 50 mg by mouth daily. Take with or immediately following a meal.    . oxyCODONE (ROXICODONE) 15 MG immediate release tablet Take 15 mg by mouth every 8 (eight) hours as needed. for pain  0    Musculoskeletal: Strength & Muscle Tone: within normal limits Gait & Station: normal Patient leans: N/A  Psychiatric Specialty Exam: Physical Exam  Psychiatric: Her speech is normal and behavior is normal. Judgment and thought content normal. Her affect is angry. Cognition and memory  are normal.    Review of Systems  Constitutional: Negative.   HENT: Negative.   Eyes: Negative.   Respiratory: Negative.   Cardiovascular: Negative.   Gastrointestinal: Negative.   Genitourinary: Negative.   Musculoskeletal: Positive for back pain and myalgias.  Skin: Negative.   Neurological: Negative.   Endo/Heme/Allergies: Negative.   Psychiatric/Behavioral: Negative.     Blood pressure 115/82, pulse 74, temperature 99.5 F (37.5 C), temperature source Oral, resp. rate 16, height '5\' 5"'$  (1.651 m), weight 72.6 kg (160 lb), SpO2 99 %.Body mass index is 26.63 kg/m.  General Appearance: Casual  Eye Contact:  Good  Speech:  Clear and Coherent  Volume:  Normal  Mood:  Euthymic  Affect:  Appropriate  Thought Process:  Coherent and Descriptions of Associations: Intact  Orientation:  Full (Time, Place, and Person)  Thought Content:  Logical  Suicidal Thoughts:  No  Homicidal Thoughts:  No  Memory:  Immediate;   Good Recent;   Good Remote;   Good  Judgement:  Fair  Insight:  Fair  Psychomotor Activity:  Normal  Concentration:  Concentration: Fair and Attention Span: Fair  Recall:  Cave of Knowledge:  Good  Language:  Good  Akathisia:  No  Handed:  Right  AIMS (if indicated):     Assets:  Communication Skills Desire for Improvement Social Support  ADL's:  Intact  Cognition:  WNL  Sleep:   fair     Treatment Plan Summary: Plan: Patient is cleared by psychiatric service.   Disposition: No evidence of imminent risk to self or others at present.   Patient does not meet criteria for psychiatric inpatient admission.  Corena Pilgrim, MD 01/01/2017 10:58 AM

## 2017-01-01 NOTE — ED Provider Notes (Signed)
Patient reportedly fell out of bed and hit head.  Doesn't remember what happened.  Will check CT head.  CT head negative.  Awaiting placement.   Montine Circle, PA-C 01/01/17 [place], MD 01/01/17 [phone]

## 2017-01-01 NOTE — ED Notes (Signed)
TTS Provider at bedside. 

## 2017-01-01 NOTE — ED Notes (Signed)
UNABLE TO GIVEN 10 AM MEDS. TTS EVALUATING . WILL GIVE WHEN INTERVIEW COMPLETE

## 2017-01-01 NOTE — ED Notes (Signed)
Pt states she is unable to take PO pills.

## 2017-01-01 NOTE — ED Notes (Signed)
Bed: EK80 Expected date:  Expected time:  Means of arrival:  Comments: Room 6

## 2017-01-08 ENCOUNTER — Ambulatory Visit: Payer: Self-pay | Admitting: Neurology

## 2017-03-21 ENCOUNTER — Inpatient Hospital Stay (HOSPITAL_COMMUNITY): Payer: Medicare HMO

## 2017-03-21 ENCOUNTER — Encounter (HOSPITAL_COMMUNITY): Payer: Self-pay | Admitting: Emergency Medicine

## 2017-03-21 ENCOUNTER — Inpatient Hospital Stay (HOSPITAL_COMMUNITY)
Admission: EM | Admit: 2017-03-21 | Discharge: 2017-03-27 | DRG: 470 | Disposition: A | Discharge status: Home / Self Care | Payer: Medicare HMO | Attending: Family Medicine | Admitting: Family Medicine

## 2017-03-21 ENCOUNTER — Emergency Department (HOSPITAL_COMMUNITY): Payer: Medicare HMO

## 2017-03-21 DIAGNOSIS — S72001A Fracture of unspecified part of neck of right femur, initial encounter for closed fracture: Principal | ICD-10-CM | POA: Diagnosis present

## 2017-03-21 DIAGNOSIS — M549 Dorsalgia, unspecified: Secondary | ICD-10-CM

## 2017-03-21 DIAGNOSIS — R627 Adult failure to thrive: Secondary | ICD-10-CM | POA: Diagnosis present

## 2017-03-21 DIAGNOSIS — Z888 Allergy status to other drugs, medicaments and biological substances status: Secondary | ICD-10-CM | POA: Diagnosis not present

## 2017-03-21 DIAGNOSIS — Z419 Encounter for procedure for purposes other than remedying health state, unspecified: Secondary | ICD-10-CM

## 2017-03-21 DIAGNOSIS — Z833 Family history of diabetes mellitus: Secondary | ICD-10-CM

## 2017-03-21 DIAGNOSIS — S72002A Fracture of unspecified part of neck of left femur, initial encounter for closed fracture: Secondary | ICD-10-CM | POA: Diagnosis not present

## 2017-03-21 DIAGNOSIS — Z820 Family history of epilepsy and other diseases of the nervous system: Secondary | ICD-10-CM

## 2017-03-21 DIAGNOSIS — M21379 Foot drop, unspecified foot: Secondary | ICD-10-CM | POA: Diagnosis present

## 2017-03-21 DIAGNOSIS — G629 Polyneuropathy, unspecified: Secondary | ICD-10-CM | POA: Diagnosis present

## 2017-03-21 DIAGNOSIS — R938 Abnormal findings on diagnostic imaging of other specified body structures: Secondary | ICD-10-CM | POA: Diagnosis not present

## 2017-03-21 DIAGNOSIS — W19XXXA Unspecified fall, initial encounter: Secondary | ICD-10-CM | POA: Diagnosis not present

## 2017-03-21 DIAGNOSIS — G40909 Epilepsy, unspecified, not intractable, without status epilepticus: Secondary | ICD-10-CM

## 2017-03-21 DIAGNOSIS — G894 Chronic pain syndrome: Secondary | ICD-10-CM | POA: Diagnosis present

## 2017-03-21 DIAGNOSIS — Z885 Allergy status to narcotic agent status: Secondary | ICD-10-CM

## 2017-03-21 DIAGNOSIS — S7290XA Unspecified fracture of unspecified femur, initial encounter for closed fracture: Secondary | ICD-10-CM | POA: Insufficient documentation

## 2017-03-21 DIAGNOSIS — S92354A Nondisplaced fracture of fifth metatarsal bone, right foot, initial encounter for closed fracture: Secondary | ICD-10-CM | POA: Diagnosis present

## 2017-03-21 DIAGNOSIS — Y92009 Unspecified place in unspecified non-institutional (private) residence as the place of occurrence of the external cause: Secondary | ICD-10-CM | POA: Diagnosis not present

## 2017-03-21 DIAGNOSIS — R569 Unspecified convulsions: Secondary | ICD-10-CM | POA: Diagnosis not present

## 2017-03-21 DIAGNOSIS — R32 Unspecified urinary incontinence: Secondary | ICD-10-CM | POA: Diagnosis present

## 2017-03-21 DIAGNOSIS — M545 Low back pain: Secondary | ICD-10-CM | POA: Diagnosis present

## 2017-03-21 DIAGNOSIS — Z7401 Bed confinement status: Secondary | ICD-10-CM

## 2017-03-21 DIAGNOSIS — Z8249 Family history of ischemic heart disease and other diseases of the circulatory system: Secondary | ICD-10-CM

## 2017-03-21 DIAGNOSIS — Z09 Encounter for follow-up examination after completed treatment for conditions other than malignant neoplasm: Secondary | ICD-10-CM

## 2017-03-21 DIAGNOSIS — W1830XA Fall on same level, unspecified, initial encounter: Secondary | ICD-10-CM | POA: Diagnosis present

## 2017-03-21 DIAGNOSIS — F909 Attention-deficit hyperactivity disorder, unspecified type: Secondary | ICD-10-CM | POA: Diagnosis present

## 2017-03-21 DIAGNOSIS — S72001Q Fracture of unspecified part of neck of right femur, subsequent encounter for open fracture type I or II with malunion: Secondary | ICD-10-CM | POA: Diagnosis not present

## 2017-03-21 DIAGNOSIS — Z23 Encounter for immunization: Secondary | ICD-10-CM | POA: Diagnosis present

## 2017-03-21 DIAGNOSIS — S92355A Nondisplaced fracture of fifth metatarsal bone, left foot, initial encounter for closed fracture: Secondary | ICD-10-CM | POA: Diagnosis present

## 2017-03-21 DIAGNOSIS — Z7982 Long term (current) use of aspirin: Secondary | ICD-10-CM

## 2017-03-21 DIAGNOSIS — G40209 Localization-related (focal) (partial) symptomatic epilepsy and epileptic syndromes with complex partial seizures, not intractable, without status epilepticus: Secondary | ICD-10-CM | POA: Diagnosis present

## 2017-03-21 DIAGNOSIS — F329 Major depressive disorder, single episode, unspecified: Secondary | ICD-10-CM | POA: Diagnosis present

## 2017-03-21 DIAGNOSIS — F4329 Adjustment disorder with other symptoms: Secondary | ICD-10-CM | POA: Diagnosis not present

## 2017-03-21 DIAGNOSIS — M21372 Foot drop, left foot: Secondary | ICD-10-CM | POA: Diagnosis present

## 2017-03-21 DIAGNOSIS — Z96649 Presence of unspecified artificial hip joint: Secondary | ICD-10-CM

## 2017-03-21 DIAGNOSIS — E538 Deficiency of other specified B group vitamins: Secondary | ICD-10-CM | POA: Diagnosis present

## 2017-03-21 DIAGNOSIS — E876 Hypokalemia: Secondary | ICD-10-CM | POA: Diagnosis present

## 2017-03-21 DIAGNOSIS — F4322 Adjustment disorder with anxiety: Secondary | ICD-10-CM | POA: Diagnosis present

## 2017-03-21 DIAGNOSIS — G8929 Other chronic pain: Secondary | ICD-10-CM | POA: Insufficient documentation

## 2017-03-21 DIAGNOSIS — Z79899 Other long term (current) drug therapy: Secondary | ICD-10-CM

## 2017-03-21 DIAGNOSIS — R9389 Abnormal findings on diagnostic imaging of other specified body structures: Secondary | ICD-10-CM

## 2017-03-21 DIAGNOSIS — I10 Essential (primary) hypertension: Secondary | ICD-10-CM | POA: Diagnosis present

## 2017-03-21 DIAGNOSIS — G43909 Migraine, unspecified, not intractable, without status migrainosus: Secondary | ICD-10-CM | POA: Diagnosis present

## 2017-03-21 DIAGNOSIS — Z96642 Presence of left artificial hip joint: Secondary | ICD-10-CM | POA: Diagnosis not present

## 2017-03-21 DIAGNOSIS — M21371 Foot drop, right foot: Secondary | ICD-10-CM | POA: Diagnosis present

## 2017-03-21 DIAGNOSIS — S92353A Displaced fracture of fifth metatarsal bone, unspecified foot, initial encounter for closed fracture: Secondary | ICD-10-CM | POA: Diagnosis present

## 2017-03-21 DIAGNOSIS — M25552 Pain in left hip: Secondary | ICD-10-CM

## 2017-03-21 HISTORY — DX: Attention-deficit hyperactivity disorder, unspecified type: F90.9

## 2017-03-21 HISTORY — DX: Unspecified fracture of right femur, initial encounter for closed fracture: S72.91XA

## 2017-03-21 LAB — COMPREHENSIVE METABOLIC PANEL
ALT: 13 U/L — ABNORMAL LOW (ref 14–54)
ANION GAP: 12 (ref 5–15)
AST: 20 U/L (ref 15–41)
Albumin: 3.4 g/dL — ABNORMAL LOW (ref 3.5–5.0)
Alkaline Phosphatase: 93 U/L (ref 38–126)
BILIRUBIN TOTAL: 1.4 mg/dL — AB (ref 0.3–1.2)
BUN: 13 mg/dL (ref 6–20)
CO2: 24 mmol/L (ref 22–32)
Calcium: 8.6 mg/dL — ABNORMAL LOW (ref 8.9–10.3)
Chloride: 104 mmol/L (ref 101–111)
Creatinine, Ser: 0.7 mg/dL (ref 0.44–1.00)
Glucose, Bld: 97 mg/dL (ref 65–99)
POTASSIUM: 3 mmol/L — AB (ref 3.5–5.1)
Sodium: 140 mmol/L (ref 135–145)
Total Protein: 7 g/dL (ref 6.5–8.1)

## 2017-03-21 LAB — URINALYSIS, ROUTINE W REFLEX MICROSCOPIC
BILIRUBIN URINE: NEGATIVE
GLUCOSE, UA: NEGATIVE mg/dL
HGB URINE DIPSTICK: NEGATIVE
KETONES UR: 5 mg/dL — AB
Leukocytes, UA: NEGATIVE
NITRITE: NEGATIVE
PH: 5 (ref 5.0–8.0)
Protein, ur: NEGATIVE mg/dL
Specific Gravity, Urine: 1.016 (ref 1.005–1.030)

## 2017-03-21 LAB — CBC WITH DIFFERENTIAL/PLATELET
BASOS ABS: 0 10*3/uL (ref 0.0–0.1)
BASOS PCT: 0 %
EOS PCT: 0 %
Eosinophils Absolute: 0 10*3/uL (ref 0.0–0.7)
HCT: 41.3 % (ref 36.0–46.0)
Hemoglobin: 12.8 g/dL (ref 12.0–15.0)
Lymphocytes Relative: 12 %
Lymphs Abs: 0.6 10*3/uL — ABNORMAL LOW (ref 0.7–4.0)
MCH: 27.2 pg (ref 26.0–34.0)
MCHC: 31 g/dL (ref 30.0–36.0)
MCV: 87.7 fL (ref 78.0–100.0)
Monocytes Absolute: 0.4 10*3/uL (ref 0.1–1.0)
Monocytes Relative: 7 %
Neutro Abs: 4 10*3/uL (ref 1.7–7.7)
Neutrophils Relative %: 81 %
PLATELETS: 221 10*3/uL (ref 150–400)
RBC: 4.71 MIL/uL (ref 3.87–5.11)
RDW: 14.3 % (ref 11.5–15.5)
WBC: 5 10*3/uL (ref 4.0–10.5)

## 2017-03-21 LAB — RAPID URINE DRUG SCREEN, HOSP PERFORMED
AMPHETAMINES: POSITIVE — AB
Barbiturates: NOT DETECTED
Benzodiazepines: POSITIVE — AB
Cocaine: NOT DETECTED
OPIATES: POSITIVE — AB
Tetrahydrocannabinol: NOT DETECTED

## 2017-03-21 LAB — TYPE AND SCREEN
ABO/RH(D): A POS
Antibody Screen: NEGATIVE

## 2017-03-21 LAB — MAGNESIUM: MAGNESIUM: 1.8 mg/dL (ref 1.7–2.4)

## 2017-03-21 LAB — ABO/RH: ABO/RH(D): A POS

## 2017-03-21 LAB — MRSA PCR SCREENING: MRSA BY PCR: NEGATIVE

## 2017-03-21 LAB — PROTIME-INR
INR: 0.93
PROTHROMBIN TIME: 12.4 s (ref 11.4–15.2)

## 2017-03-21 LAB — CK: Total CK: 57 U/L (ref 38–234)

## 2017-03-21 MED ORDER — POTASSIUM CHLORIDE IN NACL 20-0.9 MEQ/L-% IV SOLN
INTRAVENOUS | Status: AC
  Administered 2017-03-21: 16:00:00 via INTRAVENOUS
  Filled 2017-03-21: qty 1000

## 2017-03-21 MED ORDER — MAGNESIUM CITRATE PO SOLN
1.0000 | Freq: Once | ORAL | Status: DC | PRN
Start: 2017-03-21 — End: 2017-03-27

## 2017-03-21 MED ORDER — ACETAMINOPHEN 10 MG/ML IV SOLN
1000.0000 mg | INTRAVENOUS | Status: DC
Start: 2017-03-22 — End: 2017-03-22
  Filled 2017-03-21 (×2): qty 100

## 2017-03-21 MED ORDER — TRANEXAMIC ACID 1000 MG/10ML IV SOLN
1000.0000 mg | INTRAVENOUS | Status: DC
  Filled 2017-03-21: qty 10

## 2017-03-21 MED ORDER — MORPHINE SULFATE (PF) 4 MG/ML IV SOLN: 0.5000 mg | INTRAVENOUS | Status: DC | PRN

## 2017-03-21 MED ORDER — CEFAZOLIN SODIUM-DEXTROSE 2-4 GM/100ML-% IV SOLN
2.0000 g | INTRAVENOUS | Status: AC
  Administered 2017-03-22: 2 g via INTRAVENOUS
  Filled 2017-03-21 (×2): qty 100

## 2017-03-21 MED ORDER — DOCUSATE SODIUM 100 MG PO CAPS
100.0000 mg | ORAL_CAPSULE | Freq: Two times a day (BID) | ORAL | Status: DC
  Administered 2017-03-21 – 2017-03-27 (×8): 100 mg via ORAL
  Filled 2017-03-21 (×9): qty 1

## 2017-03-21 MED ORDER — ALBUTEROL SULFATE (2.5 MG/3ML) 0.083% IN NEBU: 2.5000 mg | INHALATION_SOLUTION | Freq: Four times a day (QID) | RESPIRATORY_TRACT | Status: DC | PRN

## 2017-03-21 MED ORDER — CLONAZEPAM 1 MG PO TABS
1.0000 mg | ORAL_TABLET | Freq: Four times a day (QID) | ORAL | Status: DC
  Administered 2017-03-21 – 2017-03-27 (×22): 1 mg via ORAL
  Filled 2017-03-21 (×23): qty 1

## 2017-03-21 MED ORDER — CHLORHEXIDINE GLUCONATE 4 % EX LIQD
60.0000 mL | Freq: Once | CUTANEOUS | Status: AC
  Administered 2017-03-22: 4 via TOPICAL
  Filled 2017-03-21: qty 60

## 2017-03-21 MED ORDER — FENTANYL CITRATE (PF) 100 MCG/2ML IJ SOLN
50.0000 ug | INTRAMUSCULAR | Status: DC | PRN
  Administered 2017-03-21: 50 ug via INTRAVENOUS
  Filled 2017-03-21: qty 2

## 2017-03-21 MED ORDER — TAPENTADOL HCL 50 MG PO TABS
50.0000 mg | ORAL_TABLET | Freq: Four times a day (QID) | ORAL | Status: DC
  Administered 2017-03-21 (×2): 50 mg via ORAL
  Filled 2017-03-21 (×2): qty 1

## 2017-03-21 MED ORDER — METOPROLOL SUCCINATE ER 50 MG PO TB24
50.0000 mg | ORAL_TABLET | Freq: Every day | ORAL | Status: DC
  Administered 2017-03-21 – 2017-03-27 (×7): 50 mg via ORAL
  Filled 2017-03-21 (×7): qty 1

## 2017-03-21 MED ORDER — POVIDONE-IODINE 10 % EX SWAB: 2.0000 "application " | Freq: Once | CUTANEOUS | Status: DC

## 2017-03-21 MED ORDER — MORPHINE SULFATE (PF) 4 MG/ML IV SOLN
0.5000 mg | INTRAVENOUS | Status: DC | PRN
  Filled 2017-03-21: qty 1

## 2017-03-21 MED ORDER — HYDROCODONE-ACETAMINOPHEN 5-325 MG PO TABS
1.0000 | ORAL_TABLET | Freq: Four times a day (QID) | ORAL | Status: DC | PRN
  Administered 2017-03-21: 2 via ORAL
  Administered 2017-03-23: 1 via ORAL
  Administered 2017-03-23 – 2017-03-27 (×14): 2 via ORAL
  Filled 2017-03-21 (×4): qty 2
  Filled 2017-03-21: qty 1
  Filled 2017-03-21 (×11): qty 2

## 2017-03-21 MED ORDER — METHOCARBAMOL 500 MG PO TABS
500.0000 mg | ORAL_TABLET | Freq: Four times a day (QID) | ORAL | Status: DC | PRN
  Administered 2017-03-23 – 2017-03-25 (×5): 500 mg via ORAL
  Filled 2017-03-21 (×5): qty 1

## 2017-03-21 MED ORDER — POTASSIUM CHLORIDE CRYS ER 20 MEQ PO TBCR
30.0000 meq | EXTENDED_RELEASE_TABLET | Freq: Once | ORAL | Status: DC
  Filled 2017-03-21 (×2): qty 1

## 2017-03-21 MED ORDER — TRANEXAMIC ACID 1000 MG/10ML IV SOLN
1000.0000 mg | INTRAVENOUS | Status: AC
  Administered 2017-03-22: 1000 mg via INTRAVENOUS
  Filled 2017-03-21 (×2): qty 10

## 2017-03-21 MED ORDER — POTASSIUM CHLORIDE CRYS ER 10 MEQ PO TBCR
10.0000 meq | EXTENDED_RELEASE_TABLET | Freq: Once | ORAL | Status: AC
  Administered 2017-03-21: 10 meq via ORAL
  Filled 2017-03-21: qty 1

## 2017-03-21 MED ORDER — FENTANYL CITRATE (PF) 100 MCG/2ML IJ SOLN
50.0000 ug | INTRAMUSCULAR | Status: DC | PRN
  Administered 2017-03-21 (×2): 50 ug via INTRAVENOUS
  Filled 2017-03-21 (×2): qty 2

## 2017-03-21 MED ORDER — METHOCARBAMOL 1000 MG/10ML IJ SOLN: 500.0000 mg | Freq: Four times a day (QID) | INTRAVENOUS | Status: DC | PRN

## 2017-03-21 MED ORDER — GADOBENATE DIMEGLUMINE 529 MG/ML IV SOLN
13.0000 mL | Freq: Once | INTRAVENOUS | Status: AC
  Administered 2017-03-21: 13 mL via INTRAVENOUS

## 2017-03-21 MED ORDER — ALBUTEROL SULFATE HFA 108 (90 BASE) MCG/ACT IN AERS: 2.0000 | INHALATION_SPRAY | Freq: Four times a day (QID) | RESPIRATORY_TRACT | Status: DC | PRN

## 2017-03-21 NOTE — Consult Note (Addendum)
ORTHOPAEDIC CONSULTATION  REQUESTING PHYSICIAN: Elodia Florence., *  PCP:  Rubie Maid, MD  Chief Complaint: 1. Left femoral neck fracture 2. Right fifth metatarsal base fracture.  HPI: Kelli Navarro is a 66 y.o. female who complains of  right lateral foot pain and left hip pain. Patient has seizure-like activity. She states that she had a seizure yesterday and fell. She landed on her left hip. She injured her right foot. She has been unable to weight-bear since yesterday. She came into the hospital today, where x-rays revealed a displaced left femoral neck fracture and a nondisplaced zone 1 fracture to the base of the fifth metatarsal. She denies other injuries.  Past Medical History:  Diagnosis Date  . Chronic back pain   . Depression   . Hypertension   . Right femoral fracture ([place])   . [facility] spotted fever   . Seizures ([place])    Past Surgical History:  Procedure Laterality Date  . none     Social History   Social History  . Marital status: Single    Spouse name: N/A  . Number of children: N/A  . Years of education: N/A   Social History Main Topics  . Smoking status: Never Smoker  . Smokeless tobacco: Never Used  . Alcohol use No  . Drug use: No  . Sexual activity: Not Asked   Other Topics Concern  . None   Social History Narrative  . None   Family History  Problem Relation Age of Onset  . Multiple sclerosis Mother   . Heart Problems Mother        by-pass  . Heart Problems Sister        pacemaker   Allergies  Allergen Reactions  . Aptiom [Eslicarbazepine]   . Bupropion Other (See Comments)     Caused depression  . Dilantin [Phenytoin Sodium Extended]   . Diphenhydramine Other (See Comments)    Feels like "spiders are crawling" on her  . Divalproex Sodium   . Keppra [Levetiracetam]   . Lamictal [Lamotrigine]   . Lorazepam Swelling    Hallucinations  . Lyrica [Pregabalin] Other (See Comments)    Pt reports feeling loopy  and confused  . Morphine And Related     At high doses  . Other     Allergy to steroids causing anaphylaxis    . Prednisone    Prior to Admission medications   Medication Sig Start Date End Date Taking? Authorizing Provider  amphetamine-dextroamphetamine (ADDERALL) 20 MG tablet Take 20 mg by mouth 3 (three) times daily. 02/20/17  Yes [provider]  aspirin 81 MG tablet Take 81 mg by mouth daily.   Yes [provider]  clonazePAM (KLONOPIN) 1 MG tablet Take 1 mg by mouth 4 (four) times daily. 01/02/17  Yes [provider]  metoprolol succinate (TOPROL-XL) 50 MG 24 hr tablet Take 50 mg by mouth daily. Take with or immediately following a meal.   Yes [provider]  NUCYNTA 50 MG tablet Take 50 mg by mouth QID. Taken with meals 02/04/17  Yes [provider]  albuterol (PROVENTIL HFA;VENTOLIN HFA) 108 (90 BASE) MCG/ACT inhaler Inhale 2 puffs into the lungs every 6 (six) hours as needed for wheezing.    [provider]   Dg Chest 1 View  Result Date: 03/21/2017 CLINICAL DATA:  Fall this morning due to weakness. EXAM: CHEST 1 VIEW COMPARISON:  None. FINDINGS: Borderline enlarged cardiac silhouette and mediastinal contours. Mild pulmonary  venous congestion without frank evidence of edema. Minimal left basilar/retrocardiac linear heterogeneous opacities favored to represent atelectasis or scar. No discrete focal airspace opacities. No pleural effusion or pneumothorax. Old left fifth posterolateral rib fracture. No definite acute osseus abnormalities. IMPRESSION: Pulmonary venous congestion and left basilar atelectasis without definitive superimposed acute cardiopulmonary disease on this AP supine examination. Further evaluation with a PA and lateral chest radiograph may be obtained as clinically indicated. Electronically Signed   By: Sandi Mariscal M.D.   On: 03/21/2017 13:12   Dg Lumbar Spine Complete  Result Date: 03/21/2017 CLINICAL DATA:  Post fall  this morning now with left-sided back pain EXAM: LUMBAR SPINE - COMPLETE 4+ VIEW COMPARISON:  Lumbar spine MRI  -02/03/2017 FINDINGS: There are 5 non rib-bearing lumbar type vertebral bodies. Normal alignment of lumbar spine. No anterolisthesis or retrolisthesis. Mild (under 25%) compression deformity involving the superior endplate of the L2 vertebral body is unchanged. Remaining lumbar vertebral body heights appear preserved Mild multilevel lumbar spine DDD, worse at L3-L4 and L5-S1 with disc space height loss, endplate irregularity and sclerosis. Bilateral facet degenerative change of the lower lumbar spine. Limited visualization of the bilateral SI joints is normal. Vascular calcifications overlie the lower pelvis bilaterally. IMPRESSION: 1. No acute findings. 2. Mild multilevel lumbar spine DDD. Electronically Signed   By: Sandi Mariscal M.D.   On: 03/21/2017 13:16   Dg Ankle Complete Right  Result Date: 03/21/2017 CLINICAL DATA:  Pain after fall. EXAM: RIGHT ANKLE - COMPLETE 3+ VIEW COMPARISON:  None. FINDINGS: Calcifications distal to the fibula are chronic in appearance consistent with a remote avulsion injury. There is a lucency through the base of the fifth metatarsal. No other evidence of fracture. The ankle mortise is intact. No other acute abnormalities. IMPRESSION: There is a fracture through the base of the fifth metatarsal. No displacement. Electronically Signed   By: Dorise Bullion III M.D   On: 03/21/2017 13:11   Dg Foot Complete Right  Result Date: 03/21/2017 CLINICAL DATA:  Post fall this morning, now with right foot pain. EXAM: RIGHT FOOT COMPLETE - 3+ VIEW COMPARISON:  None. FINDINGS: Osteopenia. Seen only on the lateral radiograph is a serpiginous lucency involving the base of the fifth metatarsal worrisome for a nondisplaced fracture. Joint spaces are preserved. No significant hallux valgus deformity. No erosions. Note is made of an os tibialis externum as well as an os peroneus. Large  plantar calcaneal spur. Regional soft tissues appear normal. No radiopaque foreign body. IMPRESSION: Suspected nondisplaced fracture involving the base of the fifth metatarsal. Correlation for point tenderness at this location is recommended. Electronically Signed   By: Sandi Mariscal M.D.   On: 03/21/2017 13:14   Dg Hip Unilat With Pelvis 2-3 Views Left  Result Date: 03/21/2017 CLINICAL DATA:  Initial encounter for Fall this morning due to weakness; Painful left hip, right ankle; Pt was unable to maintain dorsiflexion of the foot for imaging; Also, due to the left hip fracture, she could not be sat up for lateral chest or rolled onto lef.*comment was truncated* EXAM: DG HIP (WITH OR WITHOUT PELVIS) 2-3V LEFT COMPARISON:  None. FINDINGS: Femoral heads are located. Subcapital left femoral neck fracture with lateral displacement and varus angulation of distal fracture fragment. IMPRESSION: Angulated, displaced left femoral neck fracture. Electronically Signed   By: Abigail Miyamoto M.D.   On: 03/21/2017 13:15    Positive ROS: All other systems have been reviewed and were otherwise negative with the exception of those mentioned in  the HPI and as above.  Physical Exam: General: Alert, no acute distress Cardiovascular: No pedal edema Respiratory: No cyanosis, no use of accessory musculature GI: No organomegaly, abdomen is soft and non-tender Skin: No lesions in the area of chief complaint Neurologic: Sensation intact distally Psychiatric: Patient is competent for consent with normal mood and affect Lymphatic: No axillary or cervical lymphadenopathy  MUSCULOSKELETAL:   BUE: Examination of bilateral upper extremities reveals no skin wounds or lesions. No deformity. No tenderness to palpation, crepitation, or blocks to motion. Neurovascularly intact.  RLE: She has swelling and bruising over the lateral aspect of the foot. The ankle is nontender. Her dorsiflexion strength is 3/5, she is unable to dorsiflex  against gravity. She is not sure if this is new or old. She is able to extend the great toe and plantar flex. She reports subjective sensory change throughout the right lower extremity. She has palpable pedal pulses.  LLE: There are no skin wounds or lesions. Chest shortening and rotation of the lower extremity. Dorsiflexion strength is 4-/5. Plantar flexion and great toe extension 4+/5. She reports subjective sensory change throughout the lower extremity. She has palpable pedal pulses.  Assessment: 1. Displaced left femoral neck fracture. 2.  Nondisplaced right fifth metatarsal base fracture, zone 1.  Plan: I discussed the findings the patient and her family. For her left hip injury, I recommended left total hip arthroplasty due to her activity level. We discussed the risks, benefits, alternatives to surgery.  We will plan for surgery tomorrow morning. Nothing by mouth after midnight. Hold chemical DVT prophylaxis. It is unclear the chronicity of her bilateral lower extremity weakness. Review of the chart reveals that she has been having seizure-like activity. She does have a recent CT scan of the head that is essentially negative. I will defer further management to the primary team. With regards to the right foot, this can be treated nonoperatively in a postop shoe, weightbearing as tolerated. She will be admitted to the hospitalist service.    Jaydan Meidinger, Horald Pollen, MD Cell [phone]    03/21/2017 3:31 PM

## 2017-03-21 NOTE — H&P (Addendum)
History and Physical    Kelli Navarro DOB: [birth year] DOA: 03/21/2017  PCP: Rubie Maid, MD Patient coming from:home  HPI: Kelli Navarro is Kelli Navarro 66 y.o. female with medical history significant for chronic back pain, hypertension, anxiety, [place] spotted fever, depression, seizure disorder since emergency Department chief complaint of hip pain after fall. Initial evaluation reveals right hip fracture. [facility] are asked to admit  Information is obtained from the patient. She states last night she got up to go the bathroom and she fell. She is not sure if it is Kelli Navarro mechanical fall or if "I had Kelli Dickard seizure". Patient does have Kelli Weisel history of seizure disorder. Patient states she stopped taking all of her medications specifically narcotics and benzodiazepine 2 months ago. She is "I went cold [place]". She developed some nausea and vomiting and decided to his home her medications at Kelli Navarro lower dose. Patient reports she's been bedbound for the last several years has developed foot drop and that recently her seizure activity is increased 2-3 Chai Routh day. She denies any headache dizziness syncope or near-syncope. She denies hitting her head or losing consciousness with the fall. She denies any recent illness fever chills nausea vomiting diarrhea. She reports she's been doing "okay" and she resumed her medications. She confirms taking Klonopin 4 times Amad Mau day. She denies dysuria hematuria frequency or urgency.    ED Course: The emergency department she's afebrile hemodynamically stable and not hypoxic. She is provided with fentanyl and IV fluids.  Review of Systems: As per HPI otherwise all other systems reviewed and are negative.   Ambulatory Status: Patient with unsteady gait does not use cane or walker. Chronic back pain from back injury years ago. Mostly in bed. Recurrent falls.  Past Medical History:  Diagnosis Date  . Chronic back pain   . Depression   . Hypertension   .  Right femoral fracture ([place])   . [facility] spotted fever   . Seizures ([place])     Past Surgical History:  Procedure Laterality Date  . none      Social History   Social History  . Marital status: Single    Spouse name: N/Que Meneely  . Number of children: N/Marcee Jacobs  . Years of education: N/Jovonna Nickell   Occupational History  . Not on file.   Social History Main Topics  . Smoking status: Never Smoker  . Smokeless tobacco: Never Used  . Alcohol use No  . Drug use: No  . Sexual activity: Not on file   Other Topics Concern  . Not on file   Social History Narrative  . No narrative on file  She lives at home with wife. She is mostly bedbound. She is minimal assist with ADLs.  Allergies  Allergen Reactions  . Aptiom [Eslicarbazepine]   . Bupropion Other (See Comments)     Caused depression  . Dilantin [Phenytoin Sodium Extended]   . Diphenhydramine Other (See Comments)    Feels like "spiders are crawling" on her  . Divalproex Sodium   . Keppra [Levetiracetam]   . Lamictal [Lamotrigine]   . Lorazepam Swelling    Hallucinations  . Lyrica [Pregabalin] Other (See Comments)    Pt reports feeling loopy and confused  . Morphine And Related     At high doses  . Other     Allergy to steroids causing anaphylaxis    . Prednisone     Family History  Problem Relation Age of Onset  . Multiple sclerosis Mother   .  Heart Problems Mother        by-pass  . Heart Problems Sister        pacemaker    Prior to Admission medications   Medication Sig Start Date End Date Taking? Authorizing Provider  amphetamine-dextroamphetamine (ADDERALL) 20 MG tablet Take 20 mg by mouth 3 (three) times daily. 02/20/17  Yes [provider]  aspirin 81 MG tablet Take 81 mg by mouth daily.   Yes [provider]  clonazePAM (KLONOPIN) 1 MG tablet Take 1 mg by mouth 4 (four) times daily. 01/02/17  Yes [provider]  metoprolol succinate (TOPROL-XL) 50 MG 24 hr tablet Take 50 mg by mouth  daily. Take with or immediately following Avontae Burkhead meal.   Yes [provider]  NUCYNTA 50 MG tablet Take 50 mg by mouth QID. Taken with meals 02/04/17  Yes [provider]  albuterol (PROVENTIL HFA;VENTOLIN HFA) 108 (90 BASE) MCG/ACT inhaler Inhale 2 puffs into the lungs every 6 (six) hours as needed for wheezing.    [provider]    Physical Exam: Vitals:   03/21/17 1330 03/21/17 1400 03/21/17 1424 03/21/17 1430  BP: (!) 158/85 (!) 144/82 139/77 (!) 145/86  Pulse:   76 79  Resp: 15 18 16 16   Temp:      TempSrc:      SpO2:   97% 96%  Weight:      Height:         General:  Appears calm and Only slightly uncomfortable somewhat pale Eyes:  PERRL, EOMI, normal lids, iris ENT:  grossly normal hearing, lips & tongue, mucous membranes of her mouth are pink but dry Neck:  no LAD, masses or thyromegaly Cardiovascular:  RRR, no m/r/g. No LE edema. Pedal pulses present and palpable Respiratory:  CTA bilaterally, no w/r/r. Normal respiratory effort. Abdomen:  soft, ntnd, positive bowel sounds throughout no guarding or rebounding Skin:  no rash or induration seen on limited exam Musculoskeletal:  Leg somewhat shorter slightly rotated. Left hip tender to palpation. Bilateral foot drop.  Psychiatric:  grossly normal mood and affect, speech fluent and appropriate, AOx3 Neurologic:  CN 2-12 grossly intact, moves all extremities in coordinated fashion, sensation intact  Labs on Admission: I have personally reviewed following labs and imaging studies  CBC:  Recent Labs Lab 03/21/17 1108  WBC 5.0  NEUTROABS 4.0  HGB 12.8  HCT 41.3  MCV 87.7  PLT 725   Basic Metabolic Panel:  Recent Labs Lab 03/21/17 1118  NA 140  K 3.0*  CL 104  CO2 24  GLUCOSE 97  BUN 13  CREATININE 0.70  CALCIUM 8.6*   GFR: Estimated Creatinine Clearance: 63.4 mL/min (by C-G formula based on SCr of 0.7 mg/dL). Liver Function Tests:  Recent Labs Lab 03/21/17 1118  AST 20  ALT 13*    ALKPHOS 93  BILITOT 1.4*  PROT 7.0  ALBUMIN 3.4*   No results for input(s): LIPASE, AMYLASE in the last 168 hours. No results for input(s): AMMONIA in the last 168 hours. Coagulation Profile:  Recent Labs Lab 03/21/17 1445  INR 0.93   Cardiac Enzymes:  Recent Labs Lab 03/21/17 1118  CKTOTAL 57   BNP (last 3 results) No results for input(s): PROBNP in the last 8760 hours. HbA1C: No results for input(s): HGBA1C in the last 72 hours. CBG: No results for input(s): GLUCAP in the last 168 hours. Lipid Profile: No results for input(s): CHOL, HDL, LDLCALC, TRIG, CHOLHDL, LDLDIRECT in the last 72 hours.  Thyroid Function Tests: No results for input(s): TSH, T4TOTAL, FREET4, T3FREE, THYROIDAB in the last 72 hours. Anemia Panel: No results for input(s): VITAMINB12, FOLATE, FERRITIN, TIBC, IRON, RETICCTPCT in the last 72 hours. Urine analysis:    Component Value Date/Time   COLORURINE YELLOW 12/21/2014 1740   APPEARANCEUR CLEAR 12/21/2014 1740   LABSPEC 1.008 12/21/2014 1740   PHURINE 7.0 12/21/2014 1740   GLUCOSEU NEGATIVE 12/21/2014 1740   HGBUR NEGATIVE 12/21/2014 1740   BILIRUBINUR NEGATIVE 12/21/2014 1740   KETONESUR NEGATIVE 12/21/2014 1740   PROTEINUR NEGATIVE 12/21/2014 1740   UROBILINOGEN 0.2 12/21/2014 1740   NITRITE NEGATIVE 12/21/2014 1740   LEUKOCYTESUR MODERATE (Kelli Navarro) 12/21/2014 1740    Creatinine Clearance: Estimated Creatinine Clearance: 63.4 mL/min (by C-G formula based on SCr of 0.7 mg/dL).  Sepsis Labs: @LABRCNTIP (procalcitonin:4,lacticidven:4) )No results found for this or any previous visit (from the past 240 hour(s)).   Radiological Exams on Admission: Dg Chest 1 View  Result Date: 03/21/2017 CLINICAL DATA:  Fall this morning due to weakness. EXAM: CHEST 1 VIEW COMPARISON:  None. FINDINGS: Borderline enlarged cardiac silhouette and mediastinal contours. Mild pulmonary venous congestion without frank evidence of edema. Minimal left  basilar/retrocardiac linear heterogeneous opacities favored to represent atelectasis or scar. No discrete focal airspace opacities. No pleural effusion or pneumothorax. Old left fifth posterolateral rib fracture. No definite acute osseus abnormalities. IMPRESSION: Pulmonary venous congestion and left basilar atelectasis without definitive superimposed acute cardiopulmonary disease on this AP supine examination. Further evaluation with Bhavika Schnider PA and lateral chest radiograph may be obtained as clinically indicated. Electronically Signed   By: Kelli Mariscal Navarro.   On: 03/21/2017 13:12   Dg Lumbar Spine Complete  Result Date: 03/21/2017 CLINICAL DATA:  Post fall this morning now with left-sided back pain EXAM: LUMBAR SPINE - COMPLETE 4+ VIEW COMPARISON:  Lumbar spine MRI  -02/03/2017 FINDINGS: There are 5 non rib-bearing lumbar type vertebral bodies. Normal alignment of lumbar spine. No anterolisthesis or retrolisthesis. Mild (under 25%) compression deformity involving the superior endplate of the L2 vertebral body is unchanged. Remaining lumbar vertebral body heights appear preserved Mild multilevel lumbar spine DDD, worse at L3-L4 and L5-S1 with disc space height loss, endplate irregularity and sclerosis. Bilateral facet degenerative change of the lower lumbar spine. Limited visualization of the bilateral SI joints is normal. Vascular calcifications overlie the lower pelvis bilaterally. IMPRESSION: 1. No acute findings. 2. Mild multilevel lumbar spine DDD. Electronically Signed   By: Kelli Mariscal Navarro.   On: 03/21/2017 13:16   Dg Ankle Complete Right  Result Date: 03/21/2017 CLINICAL DATA:  Pain after fall. EXAM: RIGHT ANKLE - COMPLETE 3+ VIEW COMPARISON:  None. FINDINGS: Calcifications distal to the fibula are chronic in appearance consistent with Jackelyn Illingworth remote avulsion injury. There is Janelis Stelzer lucency through the base of the fifth metatarsal. No other evidence of fracture. The ankle mortise is intact. No other acute  abnormalities. IMPRESSION: There is Clearnce Leja fracture through the base of the fifth metatarsal. No displacement. Electronically Signed   By: Kelli Navarro   On: 03/21/2017 13:11   Dg Foot Complete Right  Result Date: 03/21/2017 CLINICAL DATA:  Post fall this morning, now with right foot pain. EXAM: RIGHT FOOT COMPLETE - 3+ VIEW COMPARISON:  None. FINDINGS: Osteopenia. Seen only on the lateral radiograph is Jaleigh Mccroskey serpiginous lucency involving the base of the fifth metatarsal worrisome for Orella Cushman nondisplaced fracture. Joint spaces are preserved. No significant hallux valgus deformity. No erosions. Note is made of an os tibialis externum  as well as an os peroneus. Large plantar calcaneal spur. Regional soft tissues appear normal. No radiopaque foreign body. IMPRESSION: Suspected nondisplaced fracture involving the base of the fifth metatarsal. Correlation for point tenderness at this location is recommended. Electronically Signed   By: Kelli Mariscal Navarro.   On: 03/21/2017 13:14   Dg Hip Unilat With Pelvis 2-3 Views Left  Result Date: 03/21/2017 CLINICAL DATA:  Initial encounter for Fall this morning due to weakness; Painful left hip, right ankle; Pt was unable to maintain dorsiflexion of the foot for imaging; Also, due to the left hip fracture, she could not be sat up for lateral chest or rolled onto lef.*comment was truncated* EXAM: DG HIP (WITH OR WITHOUT PELVIS) 2-3V LEFT COMPARISON:  None. FINDINGS: Femoral heads are located. Subcapital left femoral neck fracture with lateral displacement and varus angulation of distal fracture fragment. IMPRESSION: Angulated, displaced left femoral neck fracture. Electronically Signed   By: Kelli Navarro Navarro.   On: 03/21/2017 13:15    EKG: Independently reviewed. Sinus tachycardia Ventricular premature complex LAD, consider left anterior fascicular block Borderline prolonged QT interval Baseline wander in lead(s) V1No significant change since last  tracing  Assessment/Plan Principal Problem:   Femur fracture (HCC) Active Problems:   Seizure-like activity (HCC)   Adjustment disorder with disturbance of emotion   Fracture of 5th metatarsal   Hypokalemia   Foot drop   #1. Femur fracture/fracture 5th metatarsal. Either from Somnang Mahan mechanical fall or seizure etiology unclear. X-ray as noted above. Evaluated by orthopedic surgery in the emergency department who opined patient will need hip replacement to take to surgery this evening -Admit to [place] an INR -Obtain an EKG -Follow urinalysis and chest x-ray -Pain management -wooden boot for right foot/metatarsal fx -PT post op -social work will likely need placement -Defer to orthopedics  #2.foot drop. Patient with history of chronic back pain after back injury. Reports pain mostly bedbound over the last several years.Dr. Cheral Marker with neuro recommended lumbar MRI rule out lesion -MRI -physical therapy  #3. Seizure-like activity/adjustment disorder. Chart review indicates recent CT without acute abnormality. She has been worked up with neurology at [facility] recently and disappointed as they would not prescribe narcotics. Reportedly seizures happen at night and family sleeping so no witnessed seizure. Has been on seizure meds in past. Home meds now include benzos.  -follow up with OP  #4.hypokalemia. Mild. K=3.0 -replete -recheck   DVT prophylaxis: scd  Code Status: full  Family Communication: wife at bedside sister at bedside  Disposition Plan: likely need placement  Consults called: [facility] ortho, [facility] neuro  Admission status: inpatient    Radene Gunning MD [facility]  If 7PM-7AM, please contact night-coverage [email or web address] Password [facility]  03/21/2017, 4:02 PM   Patient independently seen and examined. Treatment plan discussed with physician extender. I have directly reviewed the clinical findings, lab, imaging studies and management of this patient in  detail. I have made the necessary changes to the above noted documentation, and agree with the documentation, as recorded by the Physician extender.  S: 66 yo with chronic back pain, HTN, anxiety, depression, seizures presenting after fall.  Story is Rodney Yera bit unclear and seems to have changed with different providers, but for me the wife reports that she had Kellon Chalk seizure yesterday morning with generalized shaking, went to bed, then she got up in the afternoon and tripped over something on the ground (she states Djeneba told her she tripped when she found her).  Makenzee states she  isn't sure if the fall was mechanical or if she had another sz.  No LH/dizziness, CP, palpitations, SOB, HA, LOC, head trauma reported.  Found to have fx of base of the 5th metatarsal and angulated L femoral neck fx.   O: VSS, BP's elevated.  RRR, no mgr, CTAB, s/nt/nd.  CN 2-12 intact, 5/5 strength to upper extremities.  LE exam most notable for weakness with dorsiflexion most notable on R side (unable to dorsiflex against gravity - L side able to dorsiflex against gravity).  L leg shortened externally rotated.  No LEE.   Kelli Navarro/P: 65 yo with 5th metatarsal fx and L femoral neck fx. Femoral/metatarsal fx as above.  Possibly to ortho tonight.  Boot for metatarsal fx.  RCRI 1 (pulm vascular congestion).  But able to walk up flight of stairs without CP or SOB.   LE weakness: exam most notable for R foot drop (and mild LLE weakness, though this is side with hip fx), occurred after fall.  Discussed with neuro, will f/u lumbar MRI.  No incontinence bowel/bladder/saddle anesthesia.  Seizures: Hasn't tolerated many AEDs.  Continue home klonipin.  CTM.  Chronic pain: nccsrs reviewed and appropriate.  On benzos/opiates which isn't ideal. Other problems as above.  Kelli Navarro

## 2017-03-21 NOTE — Progress Notes (Signed)
Orthopedic Tech Progress Note Patient Details:  Kelli Navarro 02/11/1951 [id number]  Ortho Devices Type of Ortho Device: Postop shoe/boot Ortho Device/Splint Location: rue Ortho Device/Splint Interventions: Application   Kelli Navarro 03/21/2017, 5:15 PM

## 2017-03-21 NOTE — ED Triage Notes (Signed)
Received pt from home with c/o weakness yesterday causing fall last night. Upon arrival of EMS pt was still on floor, with shortening and rotation of left leg. Pt given 150 mcg of fentanyl by EMS.
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
## 2017-03-21 NOTE — ED Provider Notes (Signed)
[facility] DEPT Provider Note   CSN: [id number] Arrival date & time: 03/21/17  1041     History   Chief Complaint Chief Complaint  Patient presents with  . Fall  . Hip Injury    HPI Kelli Navarro is a 66 y.o. female.  The history is provided by the patient and medical records. No language interpreter was used.   Kelli Navarro is a 66 y.o. female  with a PMH of seizures, HTN, depression who presents to the Emergency Department for left hip pain. Patient states that she felt weak and fell last night falling onto left hip. She states that she could not get up due to the pain, so she stayed on the floor. She states that her sister wanted her to come to the ER last night, but she refused. This morning, she decided to come in to ER. Patient unable to give many details regarding the fall itself. She believes that she just felt very weak and therefore fell. Given her history of seizures, I asked patient if she may have had a seizure last night leading to fall, to which she responded "I don't know, maybe I did". She reports pain to left rib cage, left hip, right ankle and right foot. She believes she may have rolled her right ankle in the fall. 150 mcg Fentanyl given prior to arrival by EMS with improvement in pain.    Past Medical History:  Diagnosis Date  . Chronic back pain   . Depression   . Hypertension   . [facility] spotted fever   . Seizures [facility])     Patient Active Problem List   Diagnosis Date Noted  . Left displaced femoral neck fracture ([place]) 03/21/2017  . Fracture of 5th metatarsal 03/21/2017  . Adjustment disorder with disturbance of emotion 01/01/2017  . Seizure-like activity ([place]) 10/17/2012    Past Surgical History:  Procedure Laterality Date  . none      OB History    No data available       Home Medications    Prior to Admission medications   Medication Sig Start Date End Date Taking? Authorizing Provider  amphetamine-dextroamphetamine  (ADDERALL) 20 MG tablet Take 20 mg by mouth 3 (three) times daily. 02/20/17  Yes [provider]  aspirin 81 MG tablet Take 81 mg by mouth daily.   Yes [provider]  clonazePAM (KLONOPIN) 1 MG tablet Take 1 mg by mouth 4 (four) times daily. 01/02/17  Yes [provider]  metoprolol succinate (TOPROL-XL) 50 MG 24 hr tablet Take 50 mg by mouth daily. Take with or immediately following a meal.   Yes [provider]  NUCYNTA 50 MG tablet Take 50 mg by mouth QID. Taken with meals 02/04/17  Yes [provider]  albuterol (PROVENTIL HFA;VENTOLIN HFA) 108 (90 BASE) MCG/ACT inhaler Inhale 2 puffs into the lungs every 6 (six) hours as needed for wheezing.    [provider]    Family History Family History  Problem Relation Age of Onset  . Multiple sclerosis Mother   . Heart Problems Mother        by-pass  . Heart Problems Sister        pacemaker    Social History Social History  Substance Use Topics  . Smoking status: Never Smoker  . Smokeless tobacco: Never Used  . Alcohol use No     Allergies   Aptiom [eslicarbazepine]; Bupropion; Dilantin [phenytoin sodium extended]; Diphenhydramine; Divalproex sodium; Keppra [levetiracetam]; Lamictal [  lamotrigine]; Lorazepam; Lyrica [pregabalin]; Morphine and related; Other; and Prednisone   Review of Systems Review of Systems  Musculoskeletal: Positive for arthralgias and myalgias.  Neurological: Positive for weakness.  All other systems reviewed and are negative.    Physical Exam Updated Vital Signs BP (!) 145/86   Pulse 79   Temp 98.4 F (36.9 C) (Oral)   Resp 16   Ht 5\' 7"  (1.702 m)   Wt 58.1 kg (128 lb)   SpO2 96%   BMI 20.05 kg/m   Physical Exam  Constitutional: She is oriented to person, place, and time. She appears well-developed and well-nourished. No distress.  HENT:  Head: Normocephalic and atraumatic.  Cardiovascular: Normal rate, regular rhythm and normal heart  sounds.   No murmur heard. Pulmonary/Chest: Effort normal and breath sounds normal. No respiratory distress. She has no wheezes. She has no rales.  Tenderness to palpation along left lateral rib cage with no overlying skin changes.  Abdominal: Soft. She exhibits no distension. There is no tenderness.  Musculoskeletal:  Tenderness to palpation along left hip. Decreased ROM to LLE. Tenderness to right fifth metatarsal and lateral right ankle. Full ROM of the RLE. All compartments soft. 2+ DP bilaterally.  Neurological: She is alert and oriented to person, place, and time.  Skin: Skin is warm and dry.  Nursing note and vitals reviewed.    ED Treatments / Results  Labs (all labs ordered are listed, but only abnormal results are displayed) Labs Reviewed  CBC WITH DIFFERENTIAL/PLATELET - Abnormal; Notable for the following:       Result Value   Lymphs Abs 0.6 (*)    All other components within normal limits  COMPREHENSIVE METABOLIC PANEL - Abnormal; Notable for the following:    Potassium 3.0 (*)    Calcium 8.6 (*)    Albumin 3.4 (*)    ALT 13 (*)    Total Bilirubin 1.4 (*)    All other components within normal limits  URINALYSIS, ROUTINE W REFLEX MICROSCOPIC  CK  TYPE AND SCREEN    EKG  EKG Interpretation None       Radiology Dg Chest 1 View  Result Date: 03/21/2017 CLINICAL DATA:  Fall this morning due to weakness. EXAM: CHEST 1 VIEW COMPARISON:  None. FINDINGS: Borderline enlarged cardiac silhouette and mediastinal contours. Mild pulmonary venous congestion without frank evidence of edema. Minimal left basilar/retrocardiac linear heterogeneous opacities favored to represent atelectasis or scar. No discrete focal airspace opacities. No pleural effusion or pneumothorax. Old left fifth posterolateral rib fracture. No definite acute osseus abnormalities. IMPRESSION: Pulmonary venous congestion and left basilar atelectasis without definitive superimposed acute cardiopulmonary  disease on this AP supine examination. Further evaluation with a PA and lateral chest radiograph may be obtained as clinically indicated. Electronically Signed   By: Sandi Mariscal M.D.   On: 03/21/2017 13:12   Dg Lumbar Spine Complete  Result Date: 03/21/2017 CLINICAL DATA:  Post fall this morning now with left-sided back pain EXAM: LUMBAR SPINE - COMPLETE 4+ VIEW COMPARISON:  Lumbar spine MRI  -02/03/2017 FINDINGS: There are 5 non rib-bearing lumbar type vertebral bodies. Normal alignment of lumbar spine. No anterolisthesis or retrolisthesis. Mild (under 25%) compression deformity involving the superior endplate of the L2 vertebral body is unchanged. Remaining lumbar vertebral body heights appear preserved Mild multilevel lumbar spine DDD, worse at L3-L4 and L5-S1 with disc space height loss, endplate irregularity and sclerosis. Bilateral facet degenerative change of the lower lumbar spine. Limited visualization of the bilateral SI  joints is normal. Vascular calcifications overlie the lower pelvis bilaterally. IMPRESSION: 1. No acute findings. 2. Mild multilevel lumbar spine DDD. Electronically Signed   By: Sandi Mariscal M.D.   On: 03/21/2017 13:16   Dg Ankle Complete Right  Result Date: 03/21/2017 CLINICAL DATA:  Pain after fall. EXAM: RIGHT ANKLE - COMPLETE 3+ VIEW COMPARISON:  None. FINDINGS: Calcifications distal to the fibula are chronic in appearance consistent with a remote avulsion injury. There is a lucency through the base of the fifth metatarsal. No other evidence of fracture. The ankle mortise is intact. No other acute abnormalities. IMPRESSION: There is a fracture through the base of the fifth metatarsal. No displacement. Electronically Signed   By: Dorise Bullion III M.D   On: 03/21/2017 13:11   Dg Foot Complete Right  Result Date: 03/21/2017 CLINICAL DATA:  Post fall this morning, now with right foot pain. EXAM: RIGHT FOOT COMPLETE - 3+ VIEW COMPARISON:  None. FINDINGS: Osteopenia. Seen only  on the lateral radiograph is a serpiginous lucency involving the base of the fifth metatarsal worrisome for a nondisplaced fracture. Joint spaces are preserved. No significant hallux valgus deformity. No erosions. Note is made of an os tibialis externum as well as an os peroneus. Large plantar calcaneal spur. Regional soft tissues appear normal. No radiopaque foreign body. IMPRESSION: Suspected nondisplaced fracture involving the base of the fifth metatarsal. Correlation for point tenderness at this location is recommended. Electronically Signed   By: Sandi Mariscal M.D.   On: 03/21/2017 13:14   Dg Hip Unilat With Pelvis 2-3 Views Left  Result Date: 03/21/2017 CLINICAL DATA:  Initial encounter for Fall this morning due to weakness; Painful left hip, right ankle; Pt was unable to maintain dorsiflexion of the foot for imaging; Also, due to the left hip fracture, she could not be sat up for lateral chest or rolled onto lef.*comment was truncated* EXAM: DG HIP (WITH OR WITHOUT PELVIS) 2-3V LEFT COMPARISON:  None. FINDINGS: Femoral heads are located. Subcapital left femoral neck fracture with lateral displacement and varus angulation of distal fracture fragment. IMPRESSION: Angulated, displaced left femoral neck fracture. Electronically Signed   By: Abigail Miyamoto M.D.   On: 03/21/2017 13:15    Procedures Procedures (including critical care time)  Medications Ordered in ED Medications  fentaNYL (SUBLIMAZE) injection 50 mcg (50 mcg Intravenous Given 03/21/17 1404)     Initial Impression / Assessment and Plan / ED Course  I have reviewed the triage vital signs and the nursing notes.  Pertinent labs & imaging results that were available during my care of the patient were reviewed by me and considered in my medical decision making (see chart for details).    Kelli Navarro is a 66 y.o. female who presents to ED for evaluation after fall. X-rays notable for left femoral neck fracture and fracture through  the base of the 5th right metatarsal. Discussed case with orthopedics, Dr. Lyla Glassing, who recommends hard sole shoe for metatarsal fracture. Keep patient NPO unless otherwise specified for possible repair of femoral fracture. Hospitalist consulted who will admit.   Patient seen by and discussed with Dr. Maryan Rued who agrees with treatment plan.     Final Clinical Impressions(s) / ED Diagnoses   Final diagnoses:  Closed fracture of neck of left femur, initial encounter ([place])  Closed nondisplaced fracture of fifth metatarsal bone of right foot, initial encounter  Hypokalemia    New Prescriptions New Prescriptions   No medications on file     Patty Lopezgarcia, [place] Cerise  P[place]cher, PA-C 03/21/17 1436    Blanchie Dessert, MD 03/22/17 [phone]

## 2017-03-21 NOTE — ED Notes (Signed)
Pt remains out of room for testing 

## 2017-03-22 ENCOUNTER — Inpatient Hospital Stay (HOSPITAL_COMMUNITY): Payer: Medicare HMO

## 2017-03-22 ENCOUNTER — Encounter (HOSPITAL_COMMUNITY)
Admission: EM | Disposition: A | Discharge status: Home / Self Care | Payer: Self-pay | Source: Home / Self Care | Attending: Family Medicine

## 2017-03-22 ENCOUNTER — Inpatient Hospital Stay (HOSPITAL_COMMUNITY): Payer: Medicare HMO | Admitting: Certified Registered"

## 2017-03-22 ENCOUNTER — Encounter (HOSPITAL_COMMUNITY): Payer: Self-pay | Admitting: Certified Registered"

## 2017-03-22 DIAGNOSIS — S72002A Fracture of unspecified part of neck of left femur, initial encounter for closed fracture: Secondary | ICD-10-CM | POA: Diagnosis present

## 2017-03-22 DIAGNOSIS — R938 Abnormal findings on diagnostic imaging of other specified body structures: Secondary | ICD-10-CM

## 2017-03-22 HISTORY — PX: TOTAL HIP ARTHROPLASTY: SHX124

## 2017-03-22 LAB — COMPREHENSIVE METABOLIC PANEL
ALBUMIN: 3 g/dL — AB (ref 3.5–5.0)
ALT: 11 U/L — ABNORMAL LOW (ref 14–54)
AST: 16 U/L (ref 15–41)
Alkaline Phosphatase: 82 U/L (ref 38–126)
Anion gap: 5 (ref 5–15)
BILIRUBIN TOTAL: 1.4 mg/dL — AB (ref 0.3–1.2)
BUN: 8 mg/dL (ref 6–20)
CO2: 28 mmol/L (ref 22–32)
Calcium: 8.8 mg/dL — ABNORMAL LOW (ref 8.9–10.3)
Chloride: 108 mmol/L (ref 101–111)
Creatinine, Ser: 0.71 mg/dL (ref 0.44–1.00)
GFR calc Af Amer: >60 mL/min (ref >60)
GFR calc non Af Amer: >60 mL/min (ref >60)
GLUCOSE: 96 mg/dL (ref 65–99)
Potassium: 3.7 mmol/L (ref 3.5–5.1)
SODIUM: 141 mmol/L (ref 135–145)
TOTAL PROTEIN: 6.1 g/dL — AB (ref 6.5–8.1)

## 2017-03-22 LAB — CBC
HEMATOCRIT: 38.9 % (ref 36.0–46.0)
HEMOGLOBIN: 12.5 g/dL (ref 12.0–15.0)
MCH: 28 pg (ref 26.0–34.0)
MCHC: 32.1 g/dL (ref 30.0–36.0)
MCV: 87.2 fL (ref 78.0–100.0)
Platelets: 216 10*3/uL (ref 150–400)
RBC: 4.46 MIL/uL (ref 3.87–5.11)
RDW: 14.4 % (ref 11.5–15.5)
WBC: 5.7 10*3/uL (ref 4.0–10.5)

## 2017-03-22 SURGERY — ARTHROPLASTY, HIP, TOTAL, ANTERIOR APPROACH
Anesthesia: General | Site: Hip | Laterality: Left

## 2017-03-22 MED ORDER — POLYETHYLENE GLYCOL 3350 17 G PO PACK: 17.0000 g | PACK | Freq: Every day | ORAL | Status: DC | PRN

## 2017-03-22 MED ORDER — LIDOCAINE HCL (CARDIAC) 20 MG/ML IV SOLN
INTRAVENOUS | Status: DC | PRN
  Administered 2017-03-22: 50 mg via INTRAVENOUS

## 2017-03-22 MED ORDER — ROCURONIUM BROMIDE 100 MG/10ML IV SOLN
INTRAVENOUS | Status: DC | PRN
  Administered 2017-03-22: 50 mg via INTRAVENOUS

## 2017-03-22 MED ORDER — HYDROMORPHONE HCL 1 MG/ML IJ SOLN: 0.2500 mg | INTRAMUSCULAR | Status: DC | PRN

## 2017-03-22 MED ORDER — MIDAZOLAM HCL 5 MG/5ML IJ SOLN
INTRAMUSCULAR | Status: DC | PRN
  Administered 2017-03-22: 2 mg via INTRAVENOUS

## 2017-03-22 MED ORDER — FENTANYL CITRATE (PF) 250 MCG/5ML IJ SOLN
INTRAMUSCULAR | Status: AC
  Filled 2017-03-22: qty 5

## 2017-03-22 MED ORDER — ACETAMINOPHEN 160 MG/5ML PO SOLN: 325.0000 mg | ORAL | Status: DC | PRN

## 2017-03-22 MED ORDER — SENNA 8.6 MG PO TABS
1.0000 | ORAL_TABLET | Freq: Two times a day (BID) | ORAL | Status: DC
  Administered 2017-03-22 – 2017-03-27 (×4): 8.6 mg via ORAL
  Filled 2017-03-22 (×9): qty 1

## 2017-03-22 MED ORDER — ACETAMINOPHEN 650 MG RE SUPP: 650.0000 mg | Freq: Four times a day (QID) | RECTAL | Status: DC | PRN

## 2017-03-22 MED ORDER — ACETAMINOPHEN 325 MG PO TABS: 650.0000 mg | ORAL_TABLET | Freq: Four times a day (QID) | ORAL | Status: DC | PRN

## 2017-03-22 MED ORDER — MENTHOL 3 MG MT LOZG: 1.0000 | LOZENGE | OROMUCOSAL | Status: DC | PRN

## 2017-03-22 MED ORDER — 0.9 % SODIUM CHLORIDE (POUR BTL) OPTIME
TOPICAL | Status: DC | PRN
  Administered 2017-03-22: 1000 mL

## 2017-03-22 MED ORDER — SODIUM CHLORIDE 0.9 % IV SOLN
INTRAVENOUS | Status: AC | PRN
  Administered 2017-03-22: 1000 mL

## 2017-03-22 MED ORDER — PHENOL 1.4 % MT LIQD: 1.0000 | OROMUCOSAL | Status: DC | PRN

## 2017-03-22 MED ORDER — SODIUM CHLORIDE 0.9 % IR SOLN
Status: DC | PRN
  Administered 2017-03-22: 3000 mL

## 2017-03-22 MED ORDER — SCOPOLAMINE 1 MG/3DAYS TD PT72
MEDICATED_PATCH | TRANSDERMAL | Status: DC | PRN
  Administered 2017-03-22: 1 via TRANSDERMAL

## 2017-03-22 MED ORDER — MORPHINE SULFATE (PF) 4 MG/ML IV SOLN
2.0000 mg | INTRAVENOUS | Status: DC | PRN
  Administered 2017-03-22: 2 mg via INTRAVENOUS
  Filled 2017-03-22: qty 1

## 2017-03-22 MED ORDER — PROPOFOL 10 MG/ML IV BOLUS
INTRAVENOUS | Status: DC | PRN
  Administered 2017-03-22: 20 mg via INTRAVENOUS
  Administered 2017-03-22: 100 mg via INTRAVENOUS

## 2017-03-22 MED ORDER — ASPIRIN EC 81 MG PO TBEC
81.0000 mg | DELAYED_RELEASE_TABLET | Freq: Two times a day (BID) | ORAL | Status: DC
  Administered 2017-03-23 – 2017-03-27 (×9): 81 mg via ORAL
  Filled 2017-03-22 (×9): qty 1

## 2017-03-22 MED ORDER — ONDANSETRON HCL 4 MG/2ML IJ SOLN
INTRAMUSCULAR | Status: DC | PRN
  Administered 2017-03-22: 4 mg via INTRAVENOUS

## 2017-03-22 MED ORDER — HYDROMORPHONE HCL 1 MG/ML IJ SOLN
0.5000 mg | INTRAMUSCULAR | Status: DC | PRN
  Administered 2017-03-22 (×2): 1 mg via INTRAVENOUS
  Filled 2017-03-22 (×2): qty 1

## 2017-03-22 MED ORDER — ACETAMINOPHEN 325 MG PO TABS: 325.0000 mg | ORAL_TABLET | ORAL | Status: DC | PRN

## 2017-03-22 MED ORDER — KETOROLAC TROMETHAMINE 30 MG/ML IJ SOLN
INTRAMUSCULAR | Status: DC | PRN
  Administered 2017-03-22: 30 mg via INTRAVENOUS

## 2017-03-22 MED ORDER — SCOPOLAMINE 1 MG/3DAYS TD PT72
MEDICATED_PATCH | TRANSDERMAL | Status: AC
  Filled 2017-03-22: qty 1

## 2017-03-22 MED ORDER — FENTANYL CITRATE (PF) 100 MCG/2ML IJ SOLN
INTRAMUSCULAR | Status: DC | PRN
  Administered 2017-03-22 (×2): 50 ug via INTRAVENOUS
  Administered 2017-03-22: 150 ug via INTRAVENOUS
  Administered 2017-03-22 (×2): 100 ug via INTRAVENOUS
  Administered 2017-03-22: 50 ug via INTRAVENOUS

## 2017-03-22 MED ORDER — ACETAMINOPHEN 325 MG PO TABS
650.0000 mg | ORAL_TABLET | Freq: Four times a day (QID) | ORAL | Status: DC | PRN
  Administered 2017-03-22: 650 mg via ORAL
  Filled 2017-03-22 (×2): qty 2

## 2017-03-22 MED ORDER — DEXAMETHASONE SODIUM PHOSPHATE 10 MG/ML IJ SOLN
INTRAMUSCULAR | Status: DC | PRN
  Administered 2017-03-22: 10 mg via INTRAVENOUS

## 2017-03-22 MED ORDER — METOCLOPRAMIDE HCL 5 MG/ML IJ SOLN: 5.0000 mg | Freq: Three times a day (TID) | INTRAMUSCULAR | Status: DC | PRN

## 2017-03-22 MED ORDER — OXYCODONE HCL 5 MG PO TABS: 5.0000 mg | ORAL_TABLET | Freq: Once | ORAL | Status: DC | PRN

## 2017-03-22 MED ORDER — ENOXAPARIN SODIUM 40 MG/0.4ML ~~LOC~~ SOLN
40.0000 mg | SUBCUTANEOUS | Status: DC
  Administered 2017-03-23 – 2017-03-24 (×2): 40 mg via SUBCUTANEOUS
  Filled 2017-03-22 (×2): qty 0.4

## 2017-03-22 MED ORDER — MIDAZOLAM HCL 2 MG/2ML IJ SOLN
INTRAMUSCULAR | Status: AC
  Filled 2017-03-22: qty 2

## 2017-03-22 MED ORDER — PROPOFOL 10 MG/ML IV BOLUS
INTRAVENOUS | Status: AC
  Filled 2017-03-22: qty 20

## 2017-03-22 MED ORDER — ONDANSETRON HCL 4 MG/2ML IJ SOLN
4.0000 mg | Freq: Four times a day (QID) | INTRAMUSCULAR | Status: DC | PRN
  Administered 2017-03-22: 4 mg via INTRAVENOUS
  Filled 2017-03-22: qty 2

## 2017-03-22 MED ORDER — DEXAMETHASONE SODIUM PHOSPHATE 10 MG/ML IJ SOLN
INTRAMUSCULAR | Status: AC
  Filled 2017-03-22: qty 1

## 2017-03-22 MED ORDER — BUPIVACAINE-EPINEPHRINE (PF) 0.5% -1:200000 IJ SOLN
INTRAMUSCULAR | Status: AC
  Filled 2017-03-22: qty 30

## 2017-03-22 MED ORDER — LACTATED RINGERS IV SOLN
INTRAVENOUS | Status: DC | PRN
  Administered 2017-03-22 (×2): via INTRAVENOUS

## 2017-03-22 MED ORDER — PROMETHAZINE HCL 25 MG/ML IJ SOLN
6.2500 mg | INTRAMUSCULAR | Status: DC | PRN
  Administered 2017-03-22: 6.25 mg via INTRAVENOUS

## 2017-03-22 MED ORDER — ACETAMINOPHEN 10 MG/ML IV SOLN
1000.0000 mg | Freq: Four times a day (QID) | INTRAVENOUS | Status: AC
  Administered 2017-03-22 (×2): 1000 mg via INTRAVENOUS
  Filled 2017-03-22 (×2): qty 100

## 2017-03-22 MED ORDER — KETOROLAC TROMETHAMINE 30 MG/ML IJ SOLN
INTRAMUSCULAR | Status: AC
  Filled 2017-03-22: qty 1

## 2017-03-22 MED ORDER — METOCLOPRAMIDE HCL 5 MG PO TABS: 5.0000 mg | ORAL_TABLET | Freq: Three times a day (TID) | ORAL | Status: DC | PRN

## 2017-03-22 MED ORDER — TAPENTADOL HCL 50 MG PO TABS
50.0000 mg | ORAL_TABLET | Freq: Two times a day (BID) | ORAL | Status: DC
  Administered 2017-03-22 – 2017-03-23 (×2): 50 mg via ORAL
  Filled 2017-03-22 (×2): qty 1

## 2017-03-22 MED ORDER — BUPIVACAINE-EPINEPHRINE (PF) 0.5% -1:200000 IJ SOLN
INTRAMUSCULAR | Status: DC | PRN
  Administered 2017-03-22: 30 mL via PERINEURAL

## 2017-03-22 MED ORDER — ONDANSETRON HCL 4 MG PO TABS: 4.0000 mg | ORAL_TABLET | Freq: Four times a day (QID) | ORAL | Status: DC | PRN

## 2017-03-22 MED ORDER — ROCURONIUM BROMIDE 10 MG/ML (PF) SYRINGE
PREFILLED_SYRINGE | INTRAVENOUS | Status: AC
  Filled 2017-03-22: qty 5

## 2017-03-22 MED ORDER — ONDANSETRON HCL 4 MG/2ML IJ SOLN
INTRAMUSCULAR | Status: AC
  Filled 2017-03-22: qty 2

## 2017-03-22 MED ORDER — SUGAMMADEX SODIUM 200 MG/2ML IV SOLN
INTRAVENOUS | Status: DC | PRN
  Administered 2017-03-22: 100 mg via INTRAVENOUS

## 2017-03-22 MED ORDER — LACTATED RINGERS IV SOLN
INTRAVENOUS | Status: DC
  Administered 2017-03-22: 09:00:00 via INTRAVENOUS

## 2017-03-22 MED ORDER — OXYCODONE HCL 5 MG/5ML PO SOLN: 5.0000 mg | Freq: Once | ORAL | Status: DC | PRN

## 2017-03-22 MED ORDER — PROMETHAZINE HCL 25 MG/ML IJ SOLN
INTRAMUSCULAR | Status: AC
  Filled 2017-03-22: qty 1

## 2017-03-22 MED ORDER — LIDOCAINE 2% (20 MG/ML) 5 ML SYRINGE
INTRAMUSCULAR | Status: AC
  Filled 2017-03-22: qty 5

## 2017-03-22 SURGICAL SUPPLY — 51 items
ALCOHOL ISOPROPYL (RUBBING) (MISCELLANEOUS) ×3 IMPLANT
BLADE CLIPPER SURG (BLADE) IMPLANT
CAPT HIP TOTAL 2 ×3 IMPLANT
CHLORAPREP W/TINT 26ML (MISCELLANEOUS) ×3 IMPLANT
COVER SURGICAL LIGHT HANDLE (MISCELLANEOUS) ×3 IMPLANT
DERMABOND ADVANCED (GAUZE/BANDAGES/DRESSINGS) ×2
DERMABOND ADVANCED .7 DNX12 (GAUZE/BANDAGES/DRESSINGS) ×1 IMPLANT
DRAPE C-ARM 42X72 X-RAY (DRAPES) ×3 IMPLANT
DRAPE STERI IOBAN 125X83 (DRAPES) ×3 IMPLANT
DRAPE U-SHAPE 47X51 STRL (DRAPES) ×9 IMPLANT
DRSG AQUACEL AG ADV 3.5X10 (GAUZE/BANDAGES/DRESSINGS) ×3 IMPLANT
ELECT BLADE 4.0 EZ CLEAN MEGAD (MISCELLANEOUS) ×3
ELECT REM PT RETURN 9FT ADLT (ELECTROSURGICAL) ×3
ELECTRODE BLDE 4.0 EZ CLN MEGD (MISCELLANEOUS) ×1 IMPLANT
ELECTRODE REM PT RTRN 9FT ADLT (ELECTROSURGICAL) ×1 IMPLANT
EVACUATOR 1/8 PVC DRAIN (DRAIN) IMPLANT
GLOVE BIO SURGEON STRL SZ8.5 (GLOVE) ×6 IMPLANT
GLOVE BIOGEL PI IND STRL 8.5 (GLOVE) ×1 IMPLANT
GLOVE BIOGEL PI INDICATOR 8.5 (GLOVE) ×2
GOWN STRL REUS W/ TWL LRG LVL3 (GOWN DISPOSABLE) ×2 IMPLANT
GOWN STRL REUS W/TWL 2XL LVL3 (GOWN DISPOSABLE) ×3 IMPLANT
GOWN STRL REUS W/TWL LRG LVL3 (GOWN DISPOSABLE) ×4
HANDPIECE INTERPULSE COAX TIP (DISPOSABLE) ×2
HOOD PEEL AWAY FACE SHEILD DIS (HOOD) ×6 IMPLANT
KIT BASIN OR (CUSTOM PROCEDURE TRAY) ×3 IMPLANT
KIT ROOM TURNOVER OR (KITS) ×3 IMPLANT
MANIFOLD NEPTUNE II (INSTRUMENTS) ×3 IMPLANT
MARKER SKIN DUAL TIP RULER LAB (MISCELLANEOUS) ×6 IMPLANT
NEEDLE SPNL 18GX3.5 QUINCKE PK (NEEDLE) ×3 IMPLANT
NS IRRIG 1000ML POUR BTL (IV SOLUTION) ×3 IMPLANT
PACK TOTAL JOINT (CUSTOM PROCEDURE TRAY) ×3 IMPLANT
PACK UNIVERSAL I (CUSTOM PROCEDURE TRAY) ×3 IMPLANT
PAD ARMBOARD 7.5X6 YLW CONV (MISCELLANEOUS) ×6 IMPLANT
SAW OSC TIP CART 19.5X105X1.3 (SAW) ×3 IMPLANT
SEALER BIPOLAR AQUA 6.0 (INSTRUMENTS) IMPLANT
SET HNDPC FAN SPRY TIP SCT (DISPOSABLE) ×1 IMPLANT
SOL PREP POV-IOD 4OZ 10% (MISCELLANEOUS) ×3 IMPLANT
SUT ETHIBOND NAB CT1 #1 30IN (SUTURE) ×6 IMPLANT
SUT MNCRL AB 3-0 PS2 18 (SUTURE) ×3 IMPLANT
SUT MON AB 2-0 CT1 36 (SUTURE) ×3 IMPLANT
SUT VIC AB 1 CT1 27 (SUTURE) ×2
SUT VIC AB 1 CT1 27XBRD ANBCTR (SUTURE) ×1 IMPLANT
SUT VIC AB 2-0 CT1 27 (SUTURE) ×2
SUT VIC AB 2-0 CT1 TAPERPNT 27 (SUTURE) ×1 IMPLANT
SUT VLOC 180 0 24IN GS25 (SUTURE) ×3 IMPLANT
SYR 50ML LL SCALE MARK (SYRINGE) ×3 IMPLANT
TOWEL OR 17X24 6PK STRL BLUE (TOWEL DISPOSABLE) ×3 IMPLANT
TOWEL OR 17X26 10 PK STRL BLUE (TOWEL DISPOSABLE) ×3 IMPLANT
TRAY CATH 16FR W/PLASTIC CATH (SET/KITS/TRAYS/PACK) IMPLANT
TRAY FOLEY CATH SILVER 16FR (SET/KITS/TRAYS/PACK) IMPLANT
WATER STERILE IRR 1000ML POUR (IV SOLUTION) ×9 IMPLANT

## 2017-03-22 NOTE — Progress Notes (Signed)
Addendum to previous @ 1353 - pt placed on hold due to delays in transferring up at 1340- lab draw not completed until 1405- pt out of recovery at 1415- no complaints of pain at this time- family updated in waiting room

## 2017-03-22 NOTE — Op Note (Signed)
OPERATIVE REPORT  SURGEON: Rod Can, MD   ASSISTANT: Gertie Fey, PA-C  PREOPERATIVE DIAGNOSIS: Displaced Left femoral neck fracture. Right zone 1 5th metatarsal base fracture.    POSTOPERATIVE DIAGNOSIS: Displaced Left femoral neck fracture.  Right zone 1 5th metatarsal base fracture.    PROCEDURE: Left total hip arthroplasty, anterior approach.  Closed treatment right fifth metatarsal fracture without manipulation. Interpretation of fluoroscopic images.  IMPLANTS: DePuy Tri Lock stem, size 8, hi offset. DePuy Pinnacle Cup, size 54 mm. DePuy Altrx liner, size 32 by 54 mm, +4 neutral. DePuy Biolox ceramic head ball, size 32 + 5 mm.  ANESTHESIA:  General  ESTIMATED BLOOD LOSS: 350 mL.  ANTIBIOTICS: 2 g Ancef.  DRAINS: None.  COMPLICATIONS: None.   CONDITION: PACU - hemodynamically stable.   BRIEF CLINICAL NOTE: Kelli Navarro is a 66 y.o. female who had a ground-level fall at home. She injured her right foot and her left hip. She was unable to weight-bear due to left hip pain. She came to the emergency Department at [facility], where x-rays revealed a nondisplaced extra-articular zone 1 fracture to the base of the right fifth metatarsal as well as a displaced left femoral neck fracture. She was admitted to the hospitalist service for perioperative risk stratification and medical optimization. She had bilateral foot drop on presentation, right worse than left. Due to her young age, the patient was indicated for total hip arthroplasty. The risks, benefits, and alternatives to the procedure were explained, and the patient elected to proceed.  PROCEDURE IN DETAIL: Surgical site was marked by myself in the pre-op holding area. Once inside the operating room, spinal anesthesia was obtained, and a foley catheter was inserted. The patient was then positioned on the Hana table. All bony prominences were well padded. The hip was prepped and draped in the normal  sterile surgical fashion. A time-out was called verifying side and site of surgery. The patient received IV antibiotics within 60 minutes of beginning the procedure.  The direct anterior approach to the hip was performed through the Hueter interval. Lateral femoral circumflex vessels were treated with the Auqumantys. The anterior capsule was exposed and an inverted T capsulotomy was made.I encountered the fracture hematoma, which I evacuated. I identified the comminuted subcapital femoral neck fracture. The femoral neck cut was made to the level of the templated cut. A corkscrew was placed into the head and the head was removed.The head was passed to the back table and was measured.  Acetabular exposure was achieved, and the pulvinar and labrum were excised. Sequential reaming of the acetabulum was then performed up to a size 53 mm reamer. A 54 mm cup was then opened and impacted into place at approximately 40 degrees of abduction and 20 degrees of anteversion. The final polyethylene liner was impacted into place..   I then gained femoral exposure taking care to protect the abductors and greater trochanter. This was performed using standard external rotation, extension, and adduction. The capsule was peeled off the inner aspect of the greater trochanter, taking care to preserve the short external rotators. A cookie cutter was used to enter the femoral canal, and then the femoral canal finder was placed. Sequential broaching was performed up to a size 8. Calcar planer was used on the femoral neck remnant. I placed a hi offset neck and a trial head ball. The hip was reduced. Leg lengths and offset were checked fluoroscopically. Due to inadequate soft tissue tension, I did have to lengthen her leg  approximately 8 mm in order to achieve stability. The hip was dislocated and trial components were removed. The final implants were placed, and the hip was reduced.  Fluoroscopy was used to confirm  component position and leg lengths. At 90 degrees of external rotation and full extension, the hip was stable to an anterior directed force.  The wound was copiously irrigated with normal saline using pulse lavage. Marcaine solution was injected into the periarticular soft tissue. The wound was closed in layers using #1 Vicryl and V-Loc for the fascia, 2-0 Vicryl for the subcutaneous fat, 2-0 Monocryl for the deep dermal layer, 3-0 running Monocryl subcuticular stitch, and Dermabond for the skin. Once the glue was fully dried, an Aquacell Ag dressing was applied. The patient was transported to the recovery room in stable condition. Sponge, needle, and instrument counts were correct at the end of the case x2. The patient tolerated the procedure well and there were no known complications.  Please note that a surgical assistant was a medical necessity for this procedure to perform it in a safe and expeditious manner. Assistant was necessary to provide appropriate retraction of vital neurovascular structures, to prevent femoral fracture, and to allow for anatomic placement of the prosthesis.  POSTOP PLAN: The patient will be readmitted to the hospitalist service. She may weight-bear as tolerated on the right side in a postop shoe. She may weight-bear as tolerated on the left. She will use a walker for ambulation. We will place her on aspirin for DVT prophylaxis. She will work with physical therapy and undergo disposition planning. I will see her in the office 2 weeks after discharge.

## 2017-03-22 NOTE — Progress Notes (Signed)
PROGRESS NOTE    [name]  YKD:[id number] DOB: Mar 08, 1951 DOA: 03/21/2017 PCP: Rubie Maid, MD  Brief Narrative: Kelli Navarro is a 66 y.o. female with medical history significant for chronic back pain, hypertension, anxiety, remote H/o [place] spotted fever, depression, seizure disorder since emergency Department chief complaint of hip pain after fall. Initial evaluation reveals right hip fracture, just had R hip arthroplasty an hour ago, seen in PACU   Assessment & Plan:     Femur fracture ([place]) -seen in PACU, just s/p R hemiarthroplasty -lovenox to start tomorrow -will need Rehab -PT eval pending  Seizure d/o, Foot drop, slurring of speech -unusual long standing constellation of Neuro symptoms and  fragmented care, was followed by Neurologist Dr.Feraru for complex partial seizures, chronic pain and migraines -MRI L spine unrevealing -PT eval  -I wonder if some of this is related to her clonopin/pain meds and intermittent withdrawals -cannot r/o chronic RMSF, long term sequelae possible -no longer on AEDs, tried numerous seizure meds per Neuro notes now on Klonopin only, continue Klonopin, risk of withdrawal seizures without this -recent fall out due to Pt concern abt refusal to prescribe narcotics -discussed with pt abt MRI brain, she would like to hold off on this today, will reconsider tomorrow -she will need close ongoing care with Neuro/PCP/pain med MD post DC  ? 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, will check IgG RMSF -unclear if this can be verified, reports getting some Abx for this 10years back   Chronic pain syndrome -continue Nucynta per home regimen -last MRI LS spine prior to admit from 02/03/2017 with mild Mild multilevel lumbar disc and facet degeneration, No definite neural impingement    Right foot/metatarsal fx -post op shoe recommended per Ortho   Anxiety/Depression -also contributing to all of above  DVT prophylaxis:  lovenox from tomorrow Code Status:  Full Code Family Communication: None at bedside Disposition Plan: Will likley need SNF  Consultants:   Ortho  Procedure: -L Hip arthroplasty  Subjective: Seen in PACU, somnolent, vitals stable, arousable, able to answer my questions  Objective: Vitals:   03/21/17 2115 03/22/17 0501 03/22/17 1215 03/22/17 1216  BP: 120/67 134/70  105/72  Pulse: 87 78 76 74  Resp: 16 16 11 14   Temp: 100 F (37.8 C) 99.6 F (37.6 C)    TempSrc: Oral Oral    SpO2: 96% 98% 99% 98%  Weight:      Height:        Intake/Output Summary (Last 24 hours) at 03/22/17 1306 Last data filed at 03/22/17 1150  Gross per 24 hour  Intake          2476.25 ml  Output              750 ml  Net          1726.25 ml   Filed Weights   03/21/17 1048 03/21/17 1500  Weight: 58.1 kg (128 lb) 62.7 kg (138 lb 3.2 oz)    Examination:  General exam: Somnolent arousable, no distress Respiratory system: Clear to auscultation. Respiratory effort normal. Cardiovascular system: S1 & S2 heard, RRR Gastrointestinal system: Abdomen is nondistended, soft and nontender.Normal bowel sounds heard. Central nervous system: somnolent, sedated in pACU,  Extremities: R foot drop>L Skin: No rashes, lesions or ulcers Psychiatry: flat affext    Data Reviewed:   CBC:  Recent Labs Lab 03/21/17 1108 03/22/17 0311  WBC 5.0 5.7  NEUTROABS 4.0  --   HGB 12.8 12.5  HCT 41.3 38.9  MCV 87.7 87.2  PLT 221 132   Basic Metabolic Panel:  Recent Labs Lab 03/21/17 1118 03/21/17 2017 03/22/17 0311  NA 140  --  141  K 3.0*  --  3.7  CL 104  --  108  CO2 24  --  28  GLUCOSE 97  --  96  BUN 13  --  8  CREATININE 0.70  --  0.71  CALCIUM 8.6*  --  8.8*  MG  --  1.8  --    GFR: Estimated Creatinine Clearance: 67.3 mL/min (by C-G formula based on SCr of 0.71 mg/dL). Liver Function Tests:  Recent Labs Lab 03/21/17 1118 03/22/17 0311  AST 20 16  ALT 13* 11*  ALKPHOS 93 82  BILITOT  1.4* 1.4*  PROT 7.0 6.1*  ALBUMIN 3.4* 3.0*   No results for input(s): LIPASE, AMYLASE in the last 168 hours. No results for input(s): AMMONIA in the last 168 hours. Coagulation Profile:  Recent Labs Lab 03/21/17 1445  INR 0.93   Cardiac Enzymes:  Recent Labs Lab 03/21/17 1118  CKTOTAL 57   BNP (last 3 results) No results for input(s): PROBNP in the last 8760 hours. HbA1C: No results for input(s): HGBA1C in the last 72 hours. CBG: No results for input(s): GLUCAP in the last 168 hours. Lipid Profile: No results for input(s): CHOL, HDL, LDLCALC, TRIG, CHOLHDL, LDLDIRECT in the last 72 hours. Thyroid Function Tests: No results for input(s): TSH, T4TOTAL, FREET4, T3FREE, THYROIDAB in the last 72 hours. Anemia Panel: No results for input(s): VITAMINB12, FOLATE, FERRITIN, TIBC, IRON, RETICCTPCT in the last 72 hours. Urine analysis:    Component Value Date/Time   COLORURINE YELLOW 03/21/2017 1816   APPEARANCEUR CLEAR 03/21/2017 1816   LABSPEC 1.016 03/21/2017 1816   PHURINE 5.0 03/21/2017 1816   GLUCOSEU NEGATIVE 03/21/2017 1816   HGBUR NEGATIVE 03/21/2017 1816   BILIRUBINUR NEGATIVE 03/21/2017 1816   KETONESUR 5 (A) 03/21/2017 1816   PROTEINUR NEGATIVE 03/21/2017 1816   UROBILINOGEN 0.2 12/21/2014 1740   NITRITE NEGATIVE 03/21/2017 1816   LEUKOCYTESUR NEGATIVE 03/21/2017 1816   Sepsis Labs: @LABRCNTIP (procalcitonin:4,lacticidven:4)  ) Recent Results (from the past 240 hour(s))  MRSA PCR Screening     Status: None   Collection Time: 03/21/17  6:49 PM  Result Value Ref Range Status   MRSA by PCR NEGATIVE NEGATIVE Final    Comment:        The GeneXpert MRSA Assay (FDA approved for NASAL specimens only), is one component of a comprehensive MRSA colonization surveillance program. It is not intended to diagnose MRSA infection nor to guide or monitor treatment for MRSA infections.          Radiology Studies: Dg Chest 1 View  Result Date:  03/21/2017 CLINICAL DATA:  Fall this morning due to weakness. EXAM: CHEST 1 VIEW COMPARISON:  None. FINDINGS: Borderline enlarged cardiac silhouette and mediastinal contours. Mild pulmonary venous congestion without frank evidence of edema. Minimal left basilar/retrocardiac linear heterogeneous opacities favored to represent atelectasis or scar. No discrete focal airspace opacities. No pleural effusion or pneumothorax. Old left fifth posterolateral rib fracture. No definite acute osseus abnormalities. IMPRESSION: Pulmonary venous congestion and left basilar atelectasis without definitive superimposed acute cardiopulmonary disease on this AP supine examination. Further evaluation with a PA and lateral chest radiograph may be obtained as clinically indicated. Electronically Signed   By: Sandi Mariscal M.D.   On: 03/21/2017 13:12   Dg Chest 2 View  Result Date: 03/21/2017 CLINICAL  DATA:  Preop for hip fracture EXAM: CHEST  2 VIEW COMPARISON:  03/21/2017 at 1205 hours FINDINGS: The heart size and mediastinal contours are within normal limits. Hyperinflated appearance of the lungs. There is scarring or atelectasis at the left lung base. No pneumonic consolidation or CHF. No effusion. The visualized skeletal structures are unremarkable. IMPRESSION: No active cardiopulmonary disease. Pulmonary hyperinflation question COPD. Electronically Signed   By: Ashley Royalty M.D.   On: 03/21/2017 19:18   Dg Lumbar Spine Complete  Result Date: 03/21/2017 CLINICAL DATA:  Post fall this morning now with left-sided back pain EXAM: LUMBAR SPINE - COMPLETE 4+ VIEW COMPARISON:  Lumbar spine MRI  -02/03/2017 FINDINGS: There are 5 non rib-bearing lumbar type vertebral bodies. Normal alignment of lumbar spine. No anterolisthesis or retrolisthesis. Mild (under 25%) compression deformity involving the superior endplate of the L2 vertebral body is unchanged. Remaining lumbar vertebral body heights appear preserved Mild multilevel lumbar spine  DDD, worse at L3-L4 and L5-S1 with disc space height loss, endplate irregularity and sclerosis. Bilateral facet degenerative change of the lower lumbar spine. Limited visualization of the bilateral SI joints is normal. Vascular calcifications overlie the lower pelvis bilaterally. IMPRESSION: 1. No acute findings. 2. Mild multilevel lumbar spine DDD. Electronically Signed   By: Sandi Mariscal M.D.   On: 03/21/2017 13:16   Dg Ankle Complete Right  Result Date: 03/21/2017 CLINICAL DATA:  Pain after fall. EXAM: RIGHT ANKLE - COMPLETE 3+ VIEW COMPARISON:  None. FINDINGS: Calcifications distal to the fibula are chronic in appearance consistent with a remote avulsion injury. There is a lucency through the base of the fifth metatarsal. No other evidence of fracture. The ankle mortise is intact. No other acute abnormalities. IMPRESSION: There is a fracture through the base of the fifth metatarsal. No displacement. Electronically Signed   By: Dorise Bullion III M.D   On: 03/21/2017 13:11   Mr Lumbar Spine W Wo Contrast  Result Date: 03/21/2017 CLINICAL DATA:  Right foot drop.  Radiculopathy. EXAM: MRI LUMBAR SPINE WITHOUT AND WITH CONTRAST TECHNIQUE: Multiplanar and multiecho pulse sequences of the lumbar spine were obtained without and with intravenous contrast. CONTRAST:  13 cc MultiHance intravenous COMPARISON:  02/03/2017 FINDINGS: Segmentation:  Standard. Alignment:  Borderline L5-S1 anterolisthesis. Vertebrae:  No fracture, evidence of discitis, or bone lesion. Conus medullaris: Extends to the T12 level and appears normal. Paraspinal and other soft tissues: Disc levels: T12- L1: Unremarkable. L1-L2: Minor disc height loss and bulging. Negative facets. No impingement L2-L3: Mild interspinous degenerative change.  No impingement. L3-L4: Minor disc bulging and mild facet spurring.  No impingement L4-L5: Minor annulus bulging. There is a small right paracentral disc protrusion with annular fissure seen on sagittal  acquisition. Borderline facet spurring. No impingement L5-S1:Facet arthropathy with borderline anterolisthesis. Minor annulus bulging. No impingement noted. No abnormal enhancement along the L5 nerve root. IMPRESSION: 1. No explanation for foot drop.  No L5 impingement. 2. Noncompressive degenerative changes are stable from 02/03/2017 and described above. Electronically Signed   By: Monte Fantasia M.D.   On: 03/21/2017 19:06   Pelvis Portable  Result Date: 03/22/2017 CLINICAL DATA:  Postoperative assessment the pelvis, left total hip arthroplasty EXAM: PORTABLE PELVIS 1-2 VIEWS COMPARISON:  Intraoperative images from 03/22/2017 FINDINGS: Left hip prosthesis is in place with good positioning and alignment. No acute fracture or early complicating feature identified. IMPRESSION: 1. Left total hip prosthesis in place without early complicating feature observed. Electronically Signed   By: Cindra Eves.D.  On: 03/22/2017 12:59   Dg Foot Complete Right  Result Date: 03/21/2017 CLINICAL DATA:  Post fall this morning, now with right foot pain. EXAM: RIGHT FOOT COMPLETE - 3+ VIEW COMPARISON:  None. FINDINGS: Osteopenia. Seen only on the lateral radiograph is a serpiginous lucency involving the base of the fifth metatarsal worrisome for a nondisplaced fracture. Joint spaces are preserved. No significant hallux valgus deformity. No erosions. Note is made of an os tibialis externum as well as an os peroneus. Large plantar calcaneal spur. Regional soft tissues appear normal. No radiopaque foreign body. IMPRESSION: Suspected nondisplaced fracture involving the base of the fifth metatarsal. Correlation for point tenderness at this location is recommended. Electronically Signed   By: Sandi Mariscal M.D.   On: 03/21/2017 13:14   Dg C-arm 1-60 Min  Result Date: 03/22/2017 CLINICAL DATA:  Left total hip prosthesis placement, intraoperative assessment EXAM: DG C-ARM 61-120 MIN; OPERATIVE LEFT HIP WITH PELVIS  COMPARISON:  None. FINDINGS: Two frontal intraoperative radiographs of the left hip and central pelvis demonstrate a left total hip prosthesis in place without visible fracture or complicating feature on these fluoroscopic spot images. IMPRESSION: 1. Left total hip prosthesis in place without complicating feature identified. Electronically Signed   By: Van Clines M.D.   On: 03/22/2017 13:00   Dg Hip Operative Unilat W Or W/o Pelvis Left  Result Date: 03/22/2017 CLINICAL DATA:  Left total hip prosthesis placement, intraoperative assessment EXAM: DG C-ARM 61-120 MIN; OPERATIVE LEFT HIP WITH PELVIS COMPARISON:  None. FINDINGS: Two frontal intraoperative radiographs of the left hip and central pelvis demonstrate a left total hip prosthesis in place without visible fracture or complicating feature on these fluoroscopic spot images. IMPRESSION: 1. Left total hip prosthesis in place without complicating feature identified. Electronically Signed   By: Van Clines M.D.   On: 03/22/2017 13:00   Dg Hip Unilat With Pelvis 2-3 Views Left  Result Date: 03/21/2017 CLINICAL DATA:  Initial encounter for Fall this morning due to weakness; Painful left hip, right ankle; Pt was unable to maintain dorsiflexion of the foot for imaging; Also, due to the left hip fracture, she could not be sat up for lateral chest or rolled onto lef.*comment was truncated* EXAM: DG HIP (WITH OR WITHOUT PELVIS) 2-3V LEFT COMPARISON:  None. FINDINGS: Femoral heads are located. Subcapital left femoral neck fracture with lateral displacement and varus angulation of distal fracture fragment. IMPRESSION: Angulated, displaced left femoral neck fracture. Electronically Signed   By: Abigail Miyamoto M.D.   On: 03/21/2017 13:15        Scheduled Meds: . [MAR Hold] clonazePAM  1 mg Oral QID  . [MAR Hold] docusate sodium  100 mg Oral BID  . [MAR Hold] metoprolol succinate  50 mg Oral Daily  . [MAR Hold] potassium chloride  30 mEq Oral Once   . povidone-iodine  2 application Topical Once  . promethazine      . [MAR Hold] tapentadol  50 mg Oral BID   Continuous Infusions: . 0.9 % NaCl with KCl 20 mEq / L 75 mL/hr at 03/21/17 1615  . acetaminophen    . [MAR Hold] acetaminophen Stopped (03/22/17 0516)  . lactated ringers 10 mL/hr at 03/22/17 0907  . [MAR Hold] methocarbamol (ROBAXIN)  IV       LOS: 1 day    Time spent: 67min    Domenic Polite, MD [facility] Pager [phone]  If 7PM-7AM, please contact night-coverage [email or web address] Password [email or web address] 03/22/2017, 1:06 PM

## 2017-03-22 NOTE — Anesthesia Procedure Notes (Signed)
Procedure Name: Intubation Date/Time: 03/22/2017 9:48 AM Performed by: Manuela Schwartz B Pre-anesthesia Checklist: Patient identified, Emergency Drugs available, Suction available and Patient being monitored Patient Re-evaluated:Patient Re-evaluated prior to induction Oxygen Delivery Method: Circle System Utilized Preoxygenation: Pre-oxygenation with 100% oxygen Induction Type: IV induction Ventilation: Mask ventilation without difficulty Laryngoscope Size: Mac and 3 Grade View: Grade I Tube type: Oral Tube size: 7.0 mm Number of attempts: 1 Airway Equipment and Method: Stylet and Oral airway Placement Confirmation: ETT inserted through vocal cords under direct vision,  positive ETCO2 and breath sounds checked- equal and bilateral Secured at: 21 cm Tube secured with: Tape Dental Injury: Teeth and Oropharynx as per pre-operative assessment

## 2017-03-22 NOTE — Interval H&P Note (Signed)
History and Physical Interval Note:  03/22/2017 9:35 AM  Kelli Navarro  has presented today for surgery, with the diagnosis of closed fracture of neck left femur  The various methods of treatment have been discussed with the patient and family. After consideration of risks, benefits and other options for treatment, the patient has consented to  Procedure(s): TOTAL HIP ARTHROPLASTY ANTERIOR APPROACH (Left) as a surgical intervention .  The patient's history has been reviewed, patient examined, no change in status, stable for surgery.  I have reviewed the patient's chart and labs.  Questions were answered to the patient's satisfaction.    The risks, benefits, and alternatives were discussed with the patient. There are risks associated with the surgery including, but not limited to, problems with anesthesia (death), infection, instability (giving out of the joint), dislocation, differences in leg length/angulation/rotation, fracture of bones, loosening or failure of implants, hematoma (blood accumulation) which may require surgical drainage, blood clots, pulmonary embolism, nerve injury (foot drop and lateral thigh numbness), and blood vessel injury. The patient understands these risks and elects to proceed.   Deandrea Vanpelt, Horald Pollen

## 2017-03-22 NOTE — Anesthesia Postprocedure Evaluation (Signed)
Anesthesia Post Note  Patient: Kelli Navarro  Procedure(s) Performed: Procedure(s) (LRB): TOTAL HIP ARTHROPLASTY ANTERIOR APPROACH (Left)     Patient location during evaluation: PACU Anesthesia Type: General Level of consciousness: patient cooperative Pain management: pain level controlled Vital Signs Assessment: post-procedure vital signs reviewed and stable Respiratory status: spontaneous breathing, nonlabored ventilation, respiratory function stable and patient connected to nasal cannula oxygen Cardiovascular status: blood pressure returned to baseline and stable Postop Assessment: no apparent nausea or vomiting Anesthetic complications: no    Last Vitals:  Vitals:   03/22/17 1330 03/22/17 1426  BP: 124/63 107/66  Pulse: 64 66  Resp: 13   Temp: 36.9 C 36.9 C  SpO2: 100% 98%    Last Pain:  Vitals:   03/22/17 1426  TempSrc: Oral  PainSc:                  Casey Maxfield

## 2017-03-22 NOTE — Discharge Instructions (Signed)
°Dr. Ausar Georgiou °Joint Replacement Specialist °[facility] °[address] °[place], [place] [address] °[phone] ° ° °TOTAL HIP REPLACEMENT POSTOPERATIVE DIRECTIONS ° ° ° °Hip Rehabilitation, Guidelines Following Surgery  ° °WEIGHT BEARING °Weight bearing as tolerated with assist device (walker, cane, etc) as directed, use it as long as suggested by your surgeon or therapist, typically at least 4-6 weeks. ° °The results of a hip operation are greatly improved after range of motion and muscle strengthening exercises. Follow all safety measures which are given to protect your hip. If any of these exercises cause increased pain or swelling in your joint, decrease the amount until you are comfortable again. Then slowly increase the exercises. Call your caregiver if you have problems or questions.  ° °HOME CARE INSTRUCTIONS  °Most of the following instructions are designed to prevent the dislocation of your new hip.  °Remove items at home which could result in a fall. This includes throw rugs or furniture in walking pathways.  °Continue medications as instructed at time of discharge. °· You may have some home medications which will be placed on hold until you complete the course of blood thinner medication. °· You may start showering once you are discharged home. Do not remove your dressing. °Do not put on socks or shoes without following the instructions of your caregivers.   °Sit on chairs with arms. Use the chair arms to help push yourself up when arising.  °Arrange for the use of a toilet seat elevator so you are not sitting low.  °· Walk with walker as instructed.  °You may resume a sexual relationship in one month or when given the OK by your caregiver.  °Use walker as long as suggested by your caregivers.  °You may put full weight on your legs and walk as much as is comfortable. °Avoid periods of inactivity such as sitting longer than an hour when not asleep. This helps prevent  blood clots.  °You may return to work once you are cleared by your surgeon.  °Do not drive a car for 6 weeks or until released by your surgeon.  °Do not drive while taking narcotics.  °Wear elastic stockings for two weeks following surgery during the day but you may remove then at night.  °Make sure you keep all of your appointments after your operation with all of your doctors and caregivers. You should call the office at the above phone number and make an appointment for approximately two weeks after the date of your surgery. °Please pick up a stool softener and laxative for home use as long as you are requiring pain medications. °· ICE to the affected hip every three hours for 30 minutes at a time and then as needed for pain and swelling. Continue to use ice on the hip for pain and swelling from surgery. You may notice swelling that will progress down to the foot and ankle.  This is normal after surgery.  Elevate the leg when you are not up walking on it.   °It is important for you to complete the blood thinner medication as prescribed by your doctor. °· Continue to use the breathing machine which will help keep your temperature down.  It is common for your temperature to cycle up and down following surgery, especially at night when you are not up moving around and exerting yourself.  The breathing machine keeps your lungs expanded and your temperature down. ° °RANGE OF MOTION AND STRENGTHENING EXERCISES  °These exercises are   designed to help you keep full movement of your hip joint. Follow your caregiver's or physical therapist's instructions. Perform all exercises about fifteen times, three times per day or as directed. Exercise both hips, even if you have had only one joint replacement. These exercises can be done on a training (exercise) mat, on the floor, on a table or on a bed. Use whatever works the best and is most comfortable for you. Use music or television while you are exercising so that the exercises  are a pleasant break in your day. This will make your life better with the exercises acting as a break in routine you can look forward to.  °Lying on your back, slowly slide your foot toward your buttocks, raising your knee up off the floor. Then slowly slide your foot back down until your leg is straight again.  °Lying on your back spread your legs as far apart as you can without causing discomfort.  °Lying on your side, raise your upper leg and foot straight up from the floor as far as is comfortable. Slowly lower the leg and repeat.  °Lying on your back, tighten up the muscle in the front of your thigh (quadriceps muscles). You can do this by keeping your leg straight and trying to raise your heel off the floor. This helps strengthen the largest muscle supporting your knee.  °Lying on your back, tighten up the muscles of your buttocks both with the legs straight and with the knee bent at a comfortable angle while keeping your heel on the floor.  ° °SKILLED REHAB INSTRUCTIONS: °If the patient is transferred to a skilled rehab facility following release from the hospital, a list of the current medications will be sent to the facility for the patient to continue.  When discharged from the skilled rehab facility, please have the facility set up the patient's Home Health Physical Therapy prior to being released. Also, the skilled facility will be responsible for providing the patient with their medications at time of release from the facility to include their pain medication and their blood thinner medication. If the patient is still at the rehab facility at time of the two week follow up appointment, the skilled rehab facility will also need to assist the patient in arranging follow up appointment in our office and any transportation needs. ° °MAKE SURE YOU:  °Understand these instructions.  °Will watch your condition.  °Will get help right away if you are not doing well or get worse. ° °Pick up stool softner and  laxative for home use following surgery while on pain medications. °Do not remove your dressing. °The dressing is waterproof--it is OK to take showers. °Continue to use ice for pain and swelling after surgery. °Do not use any lotions or creams on the incision until instructed by your surgeon. °Total Hip Protocol. ° ° °

## 2017-03-22 NOTE — Progress Notes (Signed)
Attempted to transport pt out of PACU- -unable to do so due to hospitalist arriving to assess pt. Attempted a second time to transport at 1350 & phlebotomist here to draw labs. Pt off monitor at this time -transported up at 1358

## 2017-03-22 NOTE — Anesthesia Preprocedure Evaluation (Signed)
Anesthesia Evaluation  Patient identified by MRN, date of birth, ID band Patient awake    Reviewed: Allergy & Precautions, NPO status , Patient's Chart, lab work & pertinent test results, reviewed documented beta blocker date and time   History of Anesthesia Complications Negative for: history of anesthetic complications  Airway Mallampati: II  TM Distance: >3 FB Neck ROM: Full    Dental  (+) Teeth Intact, Missing,    Pulmonary neg pulmonary ROS,    breath sounds clear to auscultation       Cardiovascular hypertension, Pt. on medications and Pt. on home beta blockers  Rhythm:Regular     Neuro/Psych Seizures -,  PSYCHIATRIC DISORDERS Depression  Neuromuscular disease    GI/Hepatic neg GERD  ,(+)     substance abuse  , Opioid abuse vs chronic pain   Endo/Other  negative endocrine ROS  Renal/GU negative Renal ROS     Musculoskeletal Left hip fx,, right foot fx   Abdominal   Peds  Hematology negative hematology ROS (+)   Anesthesia Other Findings   Reproductive/Obstetrics                            Anesthesia Physical Anesthesia Plan  ASA: III  Anesthesia Plan: General   Post-op Pain Management:    Induction: Intravenous  PONV Risk Score and Plan: 3 and Ondansetron, Dexamethasone, Midazolam, Treatment may vary due to age or medical condition and Scopolamine patch - Pre-op  Airway Management Planned: Oral ETT  Additional Equipment: None  Intra-op Plan:   Post-operative Plan: Extubation in OR  Informed Consent: I have reviewed the patients History and Physical, chart, labs and discussed the procedure including the risks, benefits and alternatives for the proposed anesthesia with the patient or authorized representative who has indicated his/her understanding and acceptance.   Dental advisory given  Plan Discussed with: CRNA and Surgeon  Anesthesia Plan Comments:          Anesthesia Quick Evaluation

## 2017-03-22 NOTE — H&P (View-Only) (Signed)
ORTHOPAEDIC CONSULTATION  REQUESTING PHYSICIAN: Elodia Florence., *  PCP:  Rubie Maid, MD  Chief Complaint: 1. Left femoral neck fracture 2. Right fifth metatarsal base fracture.  HPI: Kelli Navarro is a 66 y.o. female who complains of  right lateral foot pain and left hip pain. Patient has seizure-like activity. She states that she had a seizure yesterday and fell. She landed on her left hip. She injured her right foot. She has been unable to weight-bear since yesterday. She came into the hospital today, where x-rays revealed a displaced left femoral neck fracture and a nondisplaced zone 1 fracture to the base of the fifth metatarsal. She denies other injuries.  Past Medical History:  Diagnosis Date  . Chronic back pain   . Depression   . Hypertension   . Right femoral fracture ([place])   . [facility] spotted fever   . Seizures ([place])    Past Surgical History:  Procedure Laterality Date  . none     Social History   Social History  . Marital status: Single    Spouse name: N/A  . Number of children: N/A  . Years of education: N/A   Social History Main Topics  . Smoking status: Never Smoker  . Smokeless tobacco: Never Used  . Alcohol use No  . Drug use: No  . Sexual activity: Not Asked   Other Topics Concern  . None   Social History Narrative  . None   Family History  Problem Relation Age of Onset  . Multiple sclerosis Mother   . Heart Problems Mother        by-pass  . Heart Problems Sister        pacemaker   Allergies  Allergen Reactions  . Aptiom [Eslicarbazepine]   . Bupropion Other (See Comments)     Caused depression  . Dilantin [Phenytoin Sodium Extended]   . Diphenhydramine Other (See Comments)    Feels like "spiders are crawling" on her  . Divalproex Sodium   . Keppra [Levetiracetam]   . Lamictal [Lamotrigine]   . Lorazepam Swelling    Hallucinations  . Lyrica [Pregabalin] Other (See Comments)    Pt reports feeling loopy  and confused  . Morphine And Related     At high doses  . Other     Allergy to steroids causing anaphylaxis    . Prednisone    Prior to Admission medications   Medication Sig Start Date End Date Taking? Authorizing Provider  amphetamine-dextroamphetamine (ADDERALL) 20 MG tablet Take 20 mg by mouth 3 (three) times daily. 02/20/17  Yes [provider]  aspirin 81 MG tablet Take 81 mg by mouth daily.   Yes [provider]  clonazePAM (KLONOPIN) 1 MG tablet Take 1 mg by mouth 4 (four) times daily. 01/02/17  Yes [provider]  metoprolol succinate (TOPROL-XL) 50 MG 24 hr tablet Take 50 mg by mouth daily. Take with or immediately following a meal.   Yes [provider]  NUCYNTA 50 MG tablet Take 50 mg by mouth QID. Taken with meals 02/04/17  Yes [provider]  albuterol (PROVENTIL HFA;VENTOLIN HFA) 108 (90 BASE) MCG/ACT inhaler Inhale 2 puffs into the lungs every 6 (six) hours as needed for wheezing.    [provider]   Dg Chest 1 View  Result Date: 03/21/2017 CLINICAL DATA:  Fall this morning due to weakness. EXAM: CHEST 1 VIEW COMPARISON:  None. FINDINGS: Borderline enlarged cardiac silhouette and mediastinal contours. Mild pulmonary  venous congestion without frank evidence of edema. Minimal left basilar/retrocardiac linear heterogeneous opacities favored to represent atelectasis or scar. No discrete focal airspace opacities. No pleural effusion or pneumothorax. Old left fifth posterolateral rib fracture. No definite acute osseus abnormalities. IMPRESSION: Pulmonary venous congestion and left basilar atelectasis without definitive superimposed acute cardiopulmonary disease on this AP supine examination. Further evaluation with a PA and lateral chest radiograph may be obtained as clinically indicated. Electronically Signed   By: Sandi Mariscal M.D.   On: 03/21/2017 13:12   Dg Lumbar Spine Complete  Result Date: 03/21/2017 CLINICAL DATA:  Post fall  this morning now with left-sided back pain EXAM: LUMBAR SPINE - COMPLETE 4+ VIEW COMPARISON:  Lumbar spine MRI  -02/03/2017 FINDINGS: There are 5 non rib-bearing lumbar type vertebral bodies. Normal alignment of lumbar spine. No anterolisthesis or retrolisthesis. Mild (under 25%) compression deformity involving the superior endplate of the L2 vertebral body is unchanged. Remaining lumbar vertebral body heights appear preserved Mild multilevel lumbar spine DDD, worse at L3-L4 and L5-S1 with disc space height loss, endplate irregularity and sclerosis. Bilateral facet degenerative change of the lower lumbar spine. Limited visualization of the bilateral SI joints is normal. Vascular calcifications overlie the lower pelvis bilaterally. IMPRESSION: 1. No acute findings. 2. Mild multilevel lumbar spine DDD. Electronically Signed   By: Sandi Mariscal M.D.   On: 03/21/2017 13:16   Dg Ankle Complete Right  Result Date: 03/21/2017 CLINICAL DATA:  Pain after fall. EXAM: RIGHT ANKLE - COMPLETE 3+ VIEW COMPARISON:  None. FINDINGS: Calcifications distal to the fibula are chronic in appearance consistent with a remote avulsion injury. There is a lucency through the base of the fifth metatarsal. No other evidence of fracture. The ankle mortise is intact. No other acute abnormalities. IMPRESSION: There is a fracture through the base of the fifth metatarsal. No displacement. Electronically Signed   By: Dorise Bullion III M.D   On: 03/21/2017 13:11   Dg Foot Complete Right  Result Date: 03/21/2017 CLINICAL DATA:  Post fall this morning, now with right foot pain. EXAM: RIGHT FOOT COMPLETE - 3+ VIEW COMPARISON:  None. FINDINGS: Osteopenia. Seen only on the lateral radiograph is a serpiginous lucency involving the base of the fifth metatarsal worrisome for a nondisplaced fracture. Joint spaces are preserved. No significant hallux valgus deformity. No erosions. Note is made of an os tibialis externum as well as an os peroneus. Large  plantar calcaneal spur. Regional soft tissues appear normal. No radiopaque foreign body. IMPRESSION: Suspected nondisplaced fracture involving the base of the fifth metatarsal. Correlation for point tenderness at this location is recommended. Electronically Signed   By: Sandi Mariscal M.D.   On: 03/21/2017 13:14   Dg Hip Unilat With Pelvis 2-3 Views Left  Result Date: 03/21/2017 CLINICAL DATA:  Initial encounter for Fall this morning due to weakness; Painful left hip, right ankle; Pt was unable to maintain dorsiflexion of the foot for imaging; Also, due to the left hip fracture, she could not be sat up for lateral chest or rolled onto lef.*comment was truncated* EXAM: DG HIP (WITH OR WITHOUT PELVIS) 2-3V LEFT COMPARISON:  None. FINDINGS: Femoral heads are located. Subcapital left femoral neck fracture with lateral displacement and varus angulation of distal fracture fragment. IMPRESSION: Angulated, displaced left femoral neck fracture. Electronically Signed   By: Abigail Miyamoto M.D.   On: 03/21/2017 13:15    Positive ROS: All other systems have been reviewed and were otherwise negative with the exception of those mentioned in  the HPI and as above.  Physical Exam: General: Alert, no acute distress Cardiovascular: No pedal edema Respiratory: No cyanosis, no use of accessory musculature GI: No organomegaly, abdomen is soft and non-tender Skin: No lesions in the area of chief complaint Neurologic: Sensation intact distally Psychiatric: Patient is competent for consent with normal mood and affect Lymphatic: No axillary or cervical lymphadenopathy  MUSCULOSKELETAL:   BUE: Examination of bilateral upper extremities reveals no skin wounds or lesions. No deformity. No tenderness to palpation, crepitation, or blocks to motion. Neurovascularly intact.  RLE: She has swelling and bruising over the lateral aspect of the foot. The ankle is nontender. Her dorsiflexion strength is 3/5, she is unable to dorsiflex  against gravity. She is not sure if this is new or old. She is able to extend the great toe and plantar flex. She reports subjective sensory change throughout the right lower extremity. She has palpable pedal pulses.  LLE: There are no skin wounds or lesions. Chest shortening and rotation of the lower extremity. Dorsiflexion strength is 4-/5. Plantar flexion and great toe extension 4+/5. She reports subjective sensory change throughout the lower extremity. She has palpable pedal pulses.  Assessment: 1. Displaced left femoral neck fracture. 2.  Nondisplaced right fifth metatarsal base fracture, zone 1.  Plan: I discussed the findings the patient and her family. For her left hip injury, I recommended left total hip arthroplasty due to her activity level. We discussed the risks, benefits, alternatives to surgery.  We will plan for surgery tomorrow morning. Nothing by mouth after midnight. Hold chemical DVT prophylaxis. It is unclear the chronicity of her bilateral lower extremity weakness. Review of the chart reveals that she has been having seizure-like activity. She does have a recent CT scan of the head that is essentially negative. I will defer further management to the primary team. With regards to the right foot, this can be treated nonoperatively in a postop shoe, weightbearing as tolerated. She will be admitted to the hospitalist service.    Jerelyn Trimarco, Horald Pollen, MD Cell [phone]    03/21/2017 3:31 PM

## 2017-03-22 NOTE — Transfer of Care (Signed)
Immediate Anesthesia Transfer of Care Note  Patient: Kelli Navarro  Procedure(s) Performed: Procedure(s): TOTAL HIP ARTHROPLASTY ANTERIOR APPROACH (Left)  Patient Location: PACU  Anesthesia Type:General  Level of Consciousness: responds to stimulation  Airway & Oxygen Therapy: Patient Spontanous Breathing and Patient connected to nasal cannula oxygen  Post-op Assessment: Report given to RN and Post -op Vital signs reviewed and stable  Post vital signs: Reviewed and stable  Last Vitals:  Vitals:   03/21/17 2115 03/22/17 0501  BP: 120/67 134/70  Pulse: 87 78  Resp: 16 16  Temp: 37.8 C 37.6 C  SpO2: 96% 98%    Last Pain:  Vitals:   03/22/17 0735  TempSrc:   PainSc: 8          Complications: No apparent anesthesia complications

## 2017-03-23 ENCOUNTER — Encounter (HOSPITAL_COMMUNITY): Payer: Self-pay | Admitting: Orthopedic Surgery

## 2017-03-23 DIAGNOSIS — I10 Essential (primary) hypertension: Secondary | ICD-10-CM

## 2017-03-23 DIAGNOSIS — M549 Dorsalgia, unspecified: Secondary | ICD-10-CM

## 2017-03-23 DIAGNOSIS — R569 Unspecified convulsions: Secondary | ICD-10-CM

## 2017-03-23 DIAGNOSIS — S92354A Nondisplaced fracture of fifth metatarsal bone, right foot, initial encounter for closed fracture: Secondary | ICD-10-CM

## 2017-03-23 DIAGNOSIS — E876 Hypokalemia: Secondary | ICD-10-CM

## 2017-03-23 DIAGNOSIS — W19XXXA Unspecified fall, initial encounter: Secondary | ICD-10-CM

## 2017-03-23 DIAGNOSIS — G8929 Other chronic pain: Secondary | ICD-10-CM

## 2017-03-23 DIAGNOSIS — G40909 Epilepsy, unspecified, not intractable, without status epilepticus: Secondary | ICD-10-CM

## 2017-03-23 LAB — BASIC METABOLIC PANEL
Anion gap: 5 (ref 5–15)
BUN: 14 mg/dL (ref 6–20)
CALCIUM: 8.3 mg/dL — AB (ref 8.9–10.3)
CO2: 25 mmol/L (ref 22–32)
Chloride: 111 mmol/L (ref 101–111)
Creatinine, Ser: 0.72 mg/dL (ref 0.44–1.00)
Glucose, Bld: 107 mg/dL — ABNORMAL HIGH (ref 65–99)
POTASSIUM: 3.8 mmol/L (ref 3.5–5.1)
SODIUM: 141 mmol/L (ref 135–145)

## 2017-03-23 LAB — CBC
HEMATOCRIT: 29.3 % — AB (ref 36.0–46.0)
HEMOGLOBIN: 9.3 g/dL — AB (ref 12.0–15.0)
MCH: 27.4 pg (ref 26.0–34.0)
MCHC: 31.7 g/dL (ref 30.0–36.0)
MCV: 86.2 fL (ref 78.0–100.0)
Platelets: 190 10*3/uL (ref 150–400)
RBC: 3.4 MIL/uL — AB (ref 3.87–5.11)
RDW: 14.1 % (ref 11.5–15.5)
WBC: 8.7 10*3/uL (ref 4.0–10.5)

## 2017-03-23 MED ORDER — TAPENTADOL HCL 50 MG PO TABS
50.0000 mg | ORAL_TABLET | Freq: Every day | ORAL | Status: AC
  Administered 2017-03-24: 50 mg via ORAL
  Filled 2017-03-23: qty 1

## 2017-03-23 MED ORDER — PNEUMOCOCCAL VAC POLYVALENT 25 MCG/0.5ML IJ INJ
0.5000 mL | INJECTION | INTRAMUSCULAR | Status: AC
Start: 2017-03-24 — End: 2017-03-25
  Administered 2017-03-25: 0.5 mL via INTRAMUSCULAR
  Filled 2017-03-23: qty 0.5

## 2017-03-23 MED ORDER — AMPHETAMINE-DEXTROAMPHET ER 10 MG PO CP24
10.0000 mg | ORAL_CAPSULE | Freq: Every day | ORAL | Status: DC
  Administered 2017-03-24 – 2017-03-27 (×4): 10 mg via ORAL
  Filled 2017-03-23 (×4): qty 1

## 2017-03-23 MED ORDER — ENSURE ENLIVE PO LIQD
237.0000 mL | Freq: Two times a day (BID) | ORAL | Status: DC
  Administered 2017-03-27: 237 mL via ORAL

## 2017-03-23 MED ORDER — INFLUENZA VAC SPLIT HIGH-DOSE 0.5 ML IM SUSY
0.5000 mL | PREFILLED_SYRINGE | INTRAMUSCULAR | Status: AC
  Administered 2017-03-25: 0.5 mL via INTRAMUSCULAR
  Filled 2017-03-23: qty 0.5

## 2017-03-23 NOTE — Progress Notes (Signed)
   Subjective:  Patient reports pain as mild to moderate.  C/o LE weakness.  Objective:   VITALS:   Vitals:   03/22/17 2040 03/23/17 0034 03/23/17 0400 03/23/17 0550  BP: (!) 90/53 (!) 92/55  103/61  Pulse: 84 73  68  Resp: 16 16  16   Temp: 99.4 F (37.4 C) 98.7 F (37.1 C)  98.4 F (36.9 C)  TempSrc: Oral Oral  Oral  SpO2: 97% 97% 92% 93%  Weight:      Height:        NAD ABD soft Sensation intact distally Intact pulses distally Incision: dressing C/D/I Compartment soft  BLE foot drop - unchanged from preop, R 3/5, L 4/5  Lab Results  Component Value Date   WBC 8.7 03/23/2017   HGB 9.3 (L) 03/23/2017   HCT 29.3 (L) 03/23/2017   MCV 86.2 03/23/2017   PLT 190 03/23/2017   BMET    Component Value Date/Time   NA 141 03/23/2017 0221   K 3.8 03/23/2017 0221   CL 111 03/23/2017 0221   CO2 25 03/23/2017 0221   GLUCOSE 107 (H) 03/23/2017 0221   BUN 14 03/23/2017 0221   CREATININE 0.72 03/23/2017 0221   CALCIUM 8.3 (L) 03/23/2017 0221   GFRNONAA >60 03/23/2017 0221   GFRAA >60 03/23/2017 0221     Assessment/Plan: 1 Day Post-Op   Principal Problem:   Femur fracture ([facility]) Active Problems:   Seizure-like activity ([facility])   Adjustment disorder with disturbance of emotion   Fracture of 5th metatarsal   Hypokalemia   Foot drop   Displaced fracture of left femoral neck ([facility])   WBAT LLE with walker WBAT RLE in postop shoe BLE foot drop: per primary team, MRI brain ordered, MRI L spine shows no critical neural impingement PO pain control DVT ppx: ASA 81 mg PO BID, SCDs, TEDs Dispo: D/C planning, ? SNF due to weakness / deconditioning  Calyx Hawker, Horald Pollen 03/23/2017, 12:39 PM   Rod Can, MD Cell [phone]

## 2017-03-23 NOTE — Progress Notes (Signed)
Pt states that she thinks she had a small seizure during the night because she bit the inside of her cheek. No seizure activity noted during the night by the RN or NT and patients pulse ox never registered less than 91%. She is alert and oriented this morning.

## 2017-03-23 NOTE — Progress Notes (Signed)
Initial Nutrition Assessment  DOCUMENTATION CODES:   Not applicable  INTERVENTION:  Provide Ensure Enlive po BID, each supplement provides 350 kcal and 20 grams of protein  Encourage adequate PO intake.   NUTRITION DIAGNOSIS:   Increased nutrient needs related to  (post op healing) as evidenced by estimated needs.  GOAL:   Patient will meet greater than or equal to 90% of their needs  MONITOR:   PO intake, Supplement acceptance, Labs, Weight trends, Skin, I & O's  REASON FOR ASSESSMENT:   Consult Assessment of nutrition requirement/status  ASSESSMENT:   66 y.o. female with medical history significant for chronic back pain, hypertension, anxiety, [place] spotted fever, depression, seizure disorder since emergency Department chief complaint of hip pain after fall. Initial evaluation reveals right hip fracture.   PROCEDURE (9/16): Left total hip arthroplasty, anterior approach.  Closed treatment right fifth metatarsal fracture without manipulation.  Pt was unavailable, busy with therapy, during attempted time of therapy. RD unable to obtain most recent nutrition history. No percent meal completion recorded. Pt with a 13.7% weight loss per weight records. RD to order nutritional supplements to aid in caloric and protein needs as well as in post op healing.   Unable to complete Nutrition-Focused physical exam at this time.   Labs and medications reviewed.   Diet Order:  Diet regular Room service appropriate? Yes; Fluid consistency: Thin  Skin:   (Incision L thigh)  Last BM:  9/14  Height:   Ht Readings from Last 1 Encounters:  03/21/17 5\' 7"  (1.702 m)    Weight:   Wt Readings from Last 1 Encounters:  03/21/17 138 lb 3.2 oz (62.7 kg)    Ideal Body Weight:  61.36 kg  BMI:  Body mass index is 21.65 kg/m.  Estimated Nutritional Needs:   Kcal:  1700-1900  Protein:  75-90 grams  Fluid:  1.7 - 1.9 L/day  EDUCATION NEEDS:   No education needs  identified at this time  Corrin Parker, MS, RD, LDN Pager # [phone] After hours/ weekend pager # [phone]

## 2017-03-23 NOTE — Progress Notes (Signed)
PROGRESS NOTE  Kelli Navarro  WEX:[id number] DOB: [birth year] DOA: 03/21/2017 PCP: Rubie Maid, MD  Brief Narrative: Kelli Navarro is a 66 y.o. female with a history of chronic back pain, seizure disorder, HTN, anxiety/depression, and remote treated RMSF who presented with left hip pain after a fall at home, found to have a displaced femoral neck fracture. She underwent left THA 9/16 by Dr. Lyla Glassing. She was also found to have right foot drop and fracture of 5th metatarsal. She and her wife report seizure-like activity several times per day for several months. The patient is essentially bed bound due to this.   Assessment & Plan: Principal Problem:   Femur fracture (HCC) Active Problems:   Seizure-like activity (HCC)   Adjustment disorder with disturbance of emotion   Fracture of 5th metatarsal   Hypokalemia   Foot drop   Displaced fracture of left femoral neck (HCC)  Closed, displaced left femoral neck fracture: s/p anterior-approach left THA 9/16 by Dr. Lyla Glassing.  - Therapy evaluations - Pain control per orthopedics - Lovenox for DVT ppx per orthopedics  Seizure disorder: Has been seen by neurology, Dr. [name] for complex partial seizures in the past, though seems to have undergone change of doctors for unclear reasons. Notes increased frequency of seizures, foot drop, and now resolved slurring of speech. CT negative.  - Will continue only outpatient medication, clonazepam to avoid withdrawal.  - If seizure activity is witnessed, consult neurology.  Right foot drop: MRI of lumbar spine unrevealing.  - Discussed wit neurology, Dr. Rory Percy 9/17, who recommends MRI brain and neurology consultation if acute findings are present. Otherwise, would defer to outpatient neurologist with nerve conduction study.   History of treated RMSF: Reports testing positive and receiving antibiotics for this >10years ago. - IgG titers ordered. Not sure of the significance of this.  Chronic  pain syndrome: Possibly opioid use disorder. Last MRI LS spine prior to admit from 02/03/2017 with mild multilevel lumbar disc and facet degeneration, no definite neural impingement. - Continue nucynta per home regimen in addition to medications acutely as above.   Nondisplaced left 5th metatarsal fracture:  - Post op shoe recommended per ortho  Anxiety/depression - Also contributing to all of above  DVT prophylaxis: Lovenox Code Status: Full Family Communication: Wife at bedside Disposition Plan: Pending results of MRI   Consultants:   Orthopedics, [place]  Neurology, Rory Percy by phone ONLY  Procedures:   Left anterior-approach THA 9/16  Antimicrobials:  None  Subjective: Pain is controlled, endorses above history. No witnessed seizures since arrival.  Objective: BP (!) 90/53 (BP Location: Right Arm)   Pulse 79   Temp 99.4 F (37.4 C) (Oral)   Resp 16   Ht 5\' 7"  (1.702 m)   Wt 62.7 kg (138 lb 3.2 oz)   SpO2 97%   BMI 21.65 kg/m   Gen: 66 y.o. female in no distress. Pulm: Non-labored breathing room air. Clear to auscultation bilaterally.  CV: Regular rate and rhythm. No murmur, rub, or gallop. No JVD, no pedal edema. GI: Abdomen soft, non-tender, non-distended, with normoactive bowel sounds. No organomegaly or masses felt. Ext: Warm, no deformities. Left lateral incision c/d/i.  Skin: No rashes, lesions no ulcers Neuro: Alert and oriented. Right foot drop, left diminished strength in leg throughout ?effort-dependent. Reported paresthetic LLE sensation. Psych: Judgement and insight appear normal. Mood & affect appropriate.   Data Reviewed: I have personally reviewed following labs and imaging studies  CBC:  Recent Labs Lab 03/21/17 1108 03/22/17 0311  03/23/17 0221  WBC 5.0 5.7 8.7  NEUTROABS 4.0  --   --   HGB 12.8 12.5 9.3*  HCT 41.3 38.9 29.3*  MCV 87.7 87.2 86.2  PLT 221 216 024   Basic Metabolic Panel:  Recent Labs Lab 03/21/17 1118  03/21/17 2017 03/22/17 0311 03/23/17 0221  NA 140  --  141 141  K 3.0*  --  3.7 3.8  CL 104  --  108 111  CO2 24  --  28 25  GLUCOSE 97  --  96 107*  BUN 13  --  8 14  CREATININE 0.70  --  0.71 0.72  CALCIUM 8.6*  --  8.8* 8.3*  MG  --  1.8  --   --    GFR: Estimated Creatinine Clearance: 67.3 mL/min (by C-G formula based on SCr of 0.72 mg/dL). Liver Function Tests:  Recent Labs Lab 03/21/17 1118 03/22/17 0311  AST 20 16  ALT 13* 11*  ALKPHOS 93 82  BILITOT 1.4* 1.4*  PROT 7.0 6.1*  ALBUMIN 3.4* 3.0*   No results for input(s): LIPASE, AMYLASE in the last 168 hours. No results for input(s): AMMONIA in the last 168 hours. Coagulation Profile:  Recent Labs Lab 03/21/17 1445  INR 0.93   Cardiac Enzymes:  Recent Labs Lab 03/21/17 1118  CKTOTAL 57   BNP (last 3 results) No results for input(s): PROBNP in the last 8760 hours. HbA1C: No results for input(s): HGBA1C in the last 72 hours. CBG: No results for input(s): GLUCAP in the last 168 hours. Lipid Profile: No results for input(s): CHOL, HDL, LDLCALC, TRIG, CHOLHDL, LDLDIRECT in the last 72 hours. Thyroid Function Tests: No results for input(s): TSH, T4TOTAL, FREET4, T3FREE, THYROIDAB in the last 72 hours. Anemia Panel: No results for input(s): VITAMINB12, FOLATE, FERRITIN, TIBC, IRON, RETICCTPCT in the last 72 hours. Urine analysis:    Component Value Date/Time   COLORURINE YELLOW 03/21/2017 1816   APPEARANCEUR CLEAR 03/21/2017 1816   LABSPEC 1.016 03/21/2017 1816   PHURINE 5.0 03/21/2017 1816   GLUCOSEU NEGATIVE 03/21/2017 1816   HGBUR NEGATIVE 03/21/2017 1816   BILIRUBINUR NEGATIVE 03/21/2017 1816   KETONESUR 5 (A) 03/21/2017 1816   PROTEINUR NEGATIVE 03/21/2017 1816   UROBILINOGEN 0.2 12/21/2014 1740   NITRITE NEGATIVE 03/21/2017 1816   LEUKOCYTESUR NEGATIVE 03/21/2017 1816   Recent Results (from the past 240 hour(s))  MRSA PCR Screening     Status: None   Collection Time: 03/21/17  6:49 PM   Result Value Ref Range Status   MRSA by PCR NEGATIVE NEGATIVE Final    Comment:        The GeneXpert MRSA Assay (FDA approved for NASAL specimens only), is one component of a comprehensive MRSA colonization surveillance program. It is not intended to diagnose MRSA infection nor to guide or monitor treatment for MRSA infections.       Radiology Studies: Dg Chest 2 View  Result Date: 03/21/2017 CLINICAL DATA:  Preop for hip fracture EXAM: CHEST  2 VIEW COMPARISON:  03/21/2017 at 1205 hours FINDINGS: The heart size and mediastinal contours are within normal limits. Hyperinflated appearance of the lungs. There is scarring or atelectasis at the left lung base. No pneumonic consolidation or CHF. No effusion. The visualized skeletal structures are unremarkable. IMPRESSION: No active cardiopulmonary disease. Pulmonary hyperinflation question COPD. Electronically Signed   By: Ashley Royalty M.D.   On: 03/21/2017 19:18   Mr Lumbar Spine W Wo Contrast  Result Date: 03/21/2017 CLINICAL DATA:  Right foot drop.  Radiculopathy. EXAM: MRI LUMBAR SPINE WITHOUT AND WITH CONTRAST TECHNIQUE: Multiplanar and multiecho pulse sequences of the lumbar spine were obtained without and with intravenous contrast. CONTRAST:  13 cc MultiHance intravenous COMPARISON:  02/03/2017 FINDINGS: Segmentation:  Standard. Alignment:  Borderline L5-S1 anterolisthesis. Vertebrae:  No fracture, evidence of discitis, or bone lesion. Conus medullaris: Extends to the T12 level and appears normal. Paraspinal and other soft tissues: Disc levels: T12- L1: Unremarkable. L1-L2: Minor disc height loss and bulging. Negative facets. No impingement L2-L3: Mild interspinous degenerative change.  No impingement. L3-L4: Minor disc bulging and mild facet spurring.  No impingement L4-L5: Minor annulus bulging. There is a small right paracentral disc protrusion with annular fissure seen on sagittal acquisition. Borderline facet spurring. No impingement  L5-S1:Facet arthropathy with borderline anterolisthesis. Minor annulus bulging. No impingement noted. No abnormal enhancement along the L5 nerve root. IMPRESSION: 1. No explanation for foot drop.  No L5 impingement. 2. Noncompressive degenerative changes are stable from 02/03/2017 and described above. Electronically Signed   By: Monte Fantasia M.D.   On: 03/21/2017 19:06   Pelvis Portable  Result Date: 03/22/2017 CLINICAL DATA:  Postoperative assessment the pelvis, left total hip arthroplasty EXAM: PORTABLE PELVIS 1-2 VIEWS COMPARISON:  Intraoperative images from 03/22/2017 FINDINGS: Left hip prosthesis is in place with good positioning and alignment. No acute fracture or early complicating feature identified. IMPRESSION: 1. Left total hip prosthesis in place without early complicating feature observed. Electronically Signed   By: Van Clines M.D.   On: 03/22/2017 12:59   Dg C-arm 1-60 Min  Result Date: 03/22/2017 CLINICAL DATA:  Left total hip prosthesis placement, intraoperative assessment EXAM: DG C-ARM 61-120 MIN; OPERATIVE LEFT HIP WITH PELVIS COMPARISON:  None. FINDINGS: Two frontal intraoperative radiographs of the left hip and central pelvis demonstrate a left total hip prosthesis in place without visible fracture or complicating feature on these fluoroscopic spot images. IMPRESSION: 1. Left total hip prosthesis in place without complicating feature identified. Electronically Signed   By: Van Clines M.D.   On: 03/22/2017 13:00   Dg Hip Operative Unilat W Or W/o Pelvis Left  Result Date: 03/22/2017 CLINICAL DATA:  Left total hip prosthesis placement, intraoperative assessment EXAM: DG C-ARM 61-120 MIN; OPERATIVE LEFT HIP WITH PELVIS COMPARISON:  None. FINDINGS: Two frontal intraoperative radiographs of the left hip and central pelvis demonstrate a left total hip prosthesis in place without visible fracture or complicating feature on these fluoroscopic spot images. IMPRESSION: 1.  Left total hip prosthesis in place without complicating feature identified. Electronically Signed   By: Van Clines M.D.   On: 03/22/2017 13:00    Scheduled Meds: . [START ON 03/24/2017] amphetamine-dextroamphetamine  10 mg Oral Daily  . aspirin EC  81 mg Oral BID WC  . clonazePAM  1 mg Oral QID  . docusate sodium  100 mg Oral BID  . enoxaparin (LOVENOX) injection  40 mg Subcutaneous Q24H  . feeding supplement (ENSURE ENLIVE)  237 mL Oral BID BM  . [START ON 03/24/2017] Influenza vac split quadrivalent PF  0.5 mL Intramuscular Tomorrow-1000  . metoprolol succinate  50 mg Oral Daily  . [START ON 03/24/2017] pneumococcal 23 valent vaccine  0.5 mL Intramuscular Tomorrow-1000  . potassium chloride  30 mEq Oral Once  . senna  1 tablet Oral BID  . [START ON 03/24/2017] tapentadol  50 mg Oral Daily   Continuous Infusions: . lactated ringers 10 mL/hr at 03/22/17 0907  . methocarbamol (ROBAXIN)  IV  LOS: 2 days   Time spent: 35 minutes.  Vance Gather, MD [facility] Pager [phone]  If 7PM-7AM, please contact night-coverage [email or web address] Password [facility] 03/23/2017, 6:19 PM

## 2017-03-23 NOTE — Evaluation (Signed)
Occupational Therapy Evaluation Patient Details Name: Kelli Navarro MRN: [id number] DOB: October 24, 1950 Today's Date: 03/23/2017    History of Present Illness 66 y.o. female admitted with seizure and fall with left femur fx and right 5th metatarsal fx s/p Left THA anterior approach. PMHx: chronic back pain, hypertension, anxiety, [place] spotted fever, depression, seizure disorder    Clinical Impression   This 66 y/o F presents with the above. Pt lives with spouse, at baseline reports she is independent with ADLs and functional mobility. Pt currently requires MinA for functional mobility at RW level, demonstrating increased difficulty advancing LLE during mobility this session (taking approx two steps forward/backward from EOB). Currently requires ModA for LB ADLs. Pt requires verbal safety cues during mobility for RW use this session. Pt will benefit from continued acute OT services and recommend additional [facility] services after discharge home to maximize Pt's safety and independence with ADLs and functional mobility.     Follow Up Recommendations  Home health OT;Supervision/Assistance - 24 hour    Equipment Recommendations  None recommended by OT;Other (comment) (Pt has needed DME )           Precautions / Restrictions Precautions Precautions: Fall Required Braces or Orthoses: Other Brace/Splint Other Brace/Splint: post op shoe right Restrictions Weight Bearing Restrictions: Yes RLE Weight Bearing: Weight bearing as tolerated LLE Weight Bearing: Weight bearing as tolerated      Mobility Bed Mobility Overal bed mobility: Needs Assistance Bed Mobility: Supine to Sit;Sit to Supine     Supine to sit: Min assist Sit to supine: Min assist   General bed mobility comments: assist with LLE to move to EOB, increased time and use of rail; educated Pt on use of RLE to assist LLE when returning to supine however Pt requires MinA from therapist for LLE to complete    Transfers Overall transfer level: Needs assistance   Transfers: Sit to/from Stand Sit to Stand: Min assist         General transfer comment: Assist to rise and steady, verbal cues for hand placement, sequence and safety     Balance Overall balance assessment: Needs assistance Sitting-balance support: Feet supported;Single extremity supported Sitting balance-Leahy Scale: Good       Standing balance-Leahy Scale: Poor Standing balance comment: reliant on UE support during mobility                            ADL either performed or assessed with clinical judgement   ADL Overall ADL's : Needs assistance/impaired Eating/Feeding: Set up;Sitting   Grooming: Set up;Sitting   Upper Body Bathing: Min guard;Sitting   Lower Body Bathing: Minimal assistance;Sit to/from stand   Upper Body Dressing : Min guard;Sitting   Lower Body Dressing: Moderate assistance;Sit to/from stand Lower Body Dressing Details (indicate cue type and reason): Pt able to doff sock on L foot, requires assist to don new sock, MinA for locating where to remove velcro straps when doffing R post-op shoe  Toilet Transfer: Minimal assistance;Stand-pivot;BSC;RW Toilet Transfer Details (indicate cue type and reason): simulated in sit<>stand transfer from [place] and Hygiene: Minimal assistance;Sit to/from stand       Functional mobility during ADLs: Minimal assistance;Rolling walker General ADL Comments: Pt attempted ambulation within room towards bathroom, took approx 2 steps however demonstrating increased difficulty advancing LLE forward, therefore further mobility deferred, pt able to return to sitting EOB with MinA (Pt with less difficulty sliding L foot backwards when backing  up to EOB)                          Pertinent Vitals/Pain Pain Assessment: Faces Pain Score: 4  Faces Pain Scale: Hurts little more Pain Location: Left hip, reports foot is numb Pain  Descriptors / Indicators: Aching Pain Intervention(s): Limited activity within patient's tolerance;Monitored during session;Ice applied;Repositioned          Extremity/Trunk Assessment Upper Extremity Assessment Upper Extremity Assessment: Overall WFL for tasks assessed   Lower Extremity Assessment Lower Extremity Assessment: Defer to PT evaluation RLE Deficits / Details: pt with steppage gait on RLE due to painful right foot with stepping and clearing LLE Deficits / Details: pt with foot drop left and decreased ability internally rotate hip, grossly 2/5 hip flexion and knee extension LLE Sensation: decreased light touch   Cervical / Trunk Assessment Cervical / Trunk Assessment: Normal   Communication Communication Communication: No difficulties   Cognition Arousal/Alertness: Awake/alert Behavior During Therapy: WFL for tasks assessed/performed Overall Cognitive Status: Impaired/Different from baseline Area of Impairment: Problem solving                         Safety/Judgement: Decreased awareness of deficits;Decreased awareness of safety   Problem Solving: Slow processing;Requires tactile cues;Requires verbal cues General Comments: verbal cues for safe RW use; decreased problem solving    General Comments                 Home Living Family/patient expects to be discharged to:: Private residence Living Arrangements: Spouse/significant other Available Help at Discharge: Family;Available 24 hours/day Type of Home: House Home Access: Stairs to enter [employer] of Steps: 7   Home Layout: Two level;Bed/bath upstairs Alternate Level Stairs-Number of Steps: 14   Bathroom Shower/Tub: Occupational psychologist: Standard     Home Equipment: Bedside commode;Shower seat          Prior Functioning/Environment Level of Independence: Independent        Comments: pt spends a lot of time in bed due to 5-7 seizures per day        OT  Problem List: Decreased strength;Impaired balance (sitting and/or standing);Pain;Decreased range of motion;Decreased knowledge of use of DME or AE;Decreased safety awareness;Decreased activity tolerance;Impaired sensation      OT Treatment/Interventions: Self-care/ADL training;DME and/or AE instruction;Therapeutic activities;Balance training;Therapeutic exercise;Energy conservation;Patient/family education    OT Goals(Current goals can be found in the care plan section) Acute Rehab OT Goals Patient Stated Goal: return home OT Goal Formulation: With patient Time For Goal Achievement: 04/06/17 Potential to Achieve Goals: Good  OT Frequency: Min 2X/week                             AM-PAC PT "6 Clicks" Daily Activity     Outcome Measure Help from another person eating meals?: None Help from another person taking care of personal grooming?: A Little Help from another person toileting, which includes using toliet, bedpan, or urinal?: A Little Help from another person bathing (including washing, rinsing, drying)?: A Lot Help from another person to put on and taking off regular upper body clothing?: A Little Help from another person to put on and taking off regular lower body clothing?: A Lot 6 Click Score: 17   End of Session Equipment Utilized During Treatment: Gait belt;Rolling walker;Other (comment) (post op shoe ) Nurse Communication: Mobility status  Activity Tolerance: Patient tolerated treatment well Patient left: in bed;with call bell/phone within reach  OT Visit Diagnosis: Other abnormalities of gait and mobility (R26.89);Unsteadiness on feet (R26.81)                Time: 3338-3291 OT Time Calculation (min): 24 min Charges:  OT General Charges $OT Visit: 1 Visit OT Evaluation $OT Eval Low Complexity: 1 Low G-Codes:     Lou Cal, OT Pager [phone] 03/23/2017   Raymondo Band 03/23/2017, 12:50 PM

## 2017-03-23 NOTE — Evaluation (Signed)
Physical Therapy Evaluation Patient Details Name: Kelli Navarro MRN: [id number] DOB: 02-May-1951 Today's Date: 03/23/2017   History of Present Illness  66 y.o. female admitted with seizure and fall with left femur fx and right 5th metatarsal fx s/p Left THA anterior approach. PMHx: chronic back pain, hypertension, anxiety, [place] spotted fever, depression, seizure disorder   Clinical Impression  Pt very pleasant and pushing herself to ambulate increased distance today. Pt with decreased strength LLE as well as noted foot drop on LLE which pt states was not present PTA, decreased transfers, gait and function who will benefit from acute therapy to maximize mobility, function, gait and balance to decrease burden of care. 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     Follow Up Recommendations Home health PT;Supervision/Assistance - 24 hour    Equipment Recommendations  Rolling walker with 5" wheels    Recommendations for Other Services OT consult     Precautions / Restrictions Precautions Precautions: Fall Required Braces or Orthoses: Other Brace/Splint Other Brace/Splint: post op shoe right Restrictions Weight Bearing Restrictions: Yes RLE Weight Bearing: Weight bearing as tolerated LLE Weight Bearing: Weight bearing as tolerated      Mobility  Bed Mobility Overal bed mobility: Needs Assistance Bed Mobility: Supine to Sit     Supine to sit: Min assist     General bed mobility comments: cues for sequence with assist to move LLE to EOB, increased time and use of rail   Transfers Overall transfer level: Needs assistance   Transfers: Sit to/from Stand Sit to Stand: Min assist         General transfer comment: min assist to stand from bed and toilet with rail, cues for hand placement, sequence and safety  Ambulation/Gait Ambulation/Gait assistance: Min assist Ambulation Distance (Feet): 100 Feet Assistive  device: Rolling walker (2 wheeled) Gait Pattern/deviations: Step-to pattern;Decreased dorsiflexion - right;Decreased dorsiflexion - left;Trunk flexed;Decreased step length - right;Decreased step length - left   Gait velocity interpretation: Below normal speed for age/gender General Gait Details: pt with difficulty clearing bil LE with stepping, tendency to drag LLE and initially required assist to advance LLE, pt maintains external rotation LLE and required min assist to direct and manage RW with increased cues and assist with turning  Stairs            Wheelchair Mobility    Modified Rankin (Stroke Patients Only)       Balance Overall balance assessment: Needs assistance   Sitting balance-Leahy Scale: Good       Standing balance-Leahy Scale: Poor                               Pertinent Vitals/Pain Pain Assessment: 0-10 Pain Score: 4  Pain Location: Left hip, reports foot is numb Pain Descriptors / Indicators: Aching Pain Intervention(s): Limited activity within patient's tolerance;Repositioned;Monitored during session;Premedicated before session;Patient requesting pain meds-RN notified    Home Living Family/patient expects to be discharged to:: Private residence Living Arrangements: Spouse/significant other Available Help at Discharge: Family;Available 24 hours/day Type of Home: House Home Access: Stairs to enter   [employer] of Steps: 7 Home Layout: Two level;Bed/bath upstairs Home Equipment: Bedside commode;Shower seat      Prior Function Level of Independence: Independent         Comments: pt spends a lot of time in bed due to 5-7 seizures per day  Hand Dominance        Extremity/Trunk Assessment   Upper Extremity Assessment Upper Extremity Assessment: Overall WFL for tasks assessed    Lower Extremity Assessment Lower Extremity Assessment: RLE deficits/detail;LLE deficits/detail RLE Deficits / Details: pt with  steppage gait on RLE due to painful right foot with stepping and clearing LLE Deficits / Details: pt with foot drop left and decreased ability internally rotate hip, grossly 2/5 hip flexion and knee extension LLE Sensation: decreased light touch    Cervical / Trunk Assessment Cervical / Trunk Assessment: Normal  Communication   Communication: No difficulties  Cognition Arousal/Alertness: Awake/alert Behavior During Therapy: WFL for tasks assessed/performed Overall Cognitive Status: Impaired/Different from baseline Area of Impairment: Safety/judgement                         Safety/Judgement: Decreased awareness of deficits;Decreased awareness of safety     General Comments: pt leaving RW behind or too far away, not always attending to safety and position with RW. Increased distractions with divided attention      General Comments      Exercises General Exercises - Lower Extremity Heel Slides: AAROM;Left;Supine;10 reps Hip ABduction/ADduction: AAROM;Left;Supine;10 reps   Assessment/Plan    PT Assessment Patient needs continued PT services  PT Problem List Decreased strength;Decreased mobility;Decreased safety awareness;Decreased range of motion;Decreased activity tolerance;Decreased balance;Decreased knowledge of use of DME;Pain;Decreased cognition       PT Treatment Interventions DME instruction;Therapeutic activities;Gait training;Therapeutic exercise;Patient/family education;Stair training;Balance training;Functional mobility training    PT Goals (Current goals can be found in the Care Plan section)  Acute Rehab PT Goals Patient Stated Goal: return home PT Goal Formulation: With patient/family Time For Goal Achievement: 04/06/17 Potential to Achieve Goals: Good    Frequency 7X/week   Barriers to discharge        Co-evaluation               AM-PAC PT "6 Clicks" Daily Activity  Outcome Measure Difficulty turning over in bed (including adjusting  bedclothes, sheets and blankets)?: Unable Difficulty moving from lying on back to sitting on the side of the bed? : Unable Difficulty sitting down on and standing up from a chair with arms (e.g., wheelchair, bedside commode, etc,.)?: Unable Help needed moving to and from a bed to chair (including a wheelchair)?: A Little Help needed walking in hospital room?: A Little Help needed climbing 3-5 steps with a railing? : A Lot 6 Click Score: 11    End of Session Equipment Utilized During Treatment: Gait belt Activity Tolerance: Patient tolerated treatment well Patient left: in chair;with chair alarm set;with call bell/phone within reach;with family/visitor present Nurse Communication: Mobility status;Weight bearing status PT Visit Diagnosis: Other abnormalities of gait and mobility (R26.89);Difficulty in walking, not elsewhere classified (R26.2);Pain;Repeated falls (R29.6) Pain - Right/Left: Left Pain - part of body: Hip    Time: 0742-0827 PT Time Calculation (min) (ACUTE ONLY): 45 min   Charges:   PT Evaluation $PT Eval Moderate Complexity: 1 Mod PT Treatments $Gait Training: 8-22 mins $Therapeutic Activity: 8-22 mins   PT G Codes:        Elwyn Reach, PT [phone]   Reeya Bound B Loran Fleet 03/23/2017, 9:39 AM

## 2017-03-24 ENCOUNTER — Inpatient Hospital Stay (HOSPITAL_COMMUNITY): Payer: Medicare HMO

## 2017-03-24 DIAGNOSIS — G40909 Epilepsy, unspecified, not intractable, without status epilepticus: Secondary | ICD-10-CM

## 2017-03-24 DIAGNOSIS — S72002A Fracture of unspecified part of neck of left femur, initial encounter for closed fracture: Secondary | ICD-10-CM

## 2017-03-24 DIAGNOSIS — F4329 Adjustment disorder with other symptoms: Secondary | ICD-10-CM

## 2017-03-24 DIAGNOSIS — M21372 Foot drop, left foot: Secondary | ICD-10-CM

## 2017-03-24 DIAGNOSIS — M21371 Foot drop, right foot: Secondary | ICD-10-CM

## 2017-03-24 LAB — CBC
HCT: 25.1 % — ABNORMAL LOW (ref 36.0–46.0)
HEMOGLOBIN: 7.9 g/dL — AB (ref 12.0–15.0)
MCH: 27.3 pg (ref 26.0–34.0)
MCHC: 31.5 g/dL (ref 30.0–36.0)
MCV: 86.9 fL (ref 78.0–100.0)
PLATELETS: 196 10*3/uL (ref 150–400)
RBC: 2.89 MIL/uL — AB (ref 3.87–5.11)
RDW: 14.3 % (ref 11.5–15.5)
WBC: 6.1 10*3/uL (ref 4.0–10.5)

## 2017-03-24 LAB — BASIC METABOLIC PANEL
ANION GAP: 3 — AB (ref 5–15)
BUN: 17 mg/dL (ref 6–20)
CO2: 27 mmol/L (ref 22–32)
Calcium: 7.9 mg/dL — ABNORMAL LOW (ref 8.9–10.3)
Chloride: 113 mmol/L — ABNORMAL HIGH (ref 101–111)
Creatinine, Ser: 0.62 mg/dL (ref 0.44–1.00)
Glucose, Bld: 87 mg/dL (ref 65–99)
POTASSIUM: 3.5 mmol/L (ref 3.5–5.1)
SODIUM: 143 mmol/L (ref 135–145)

## 2017-03-24 LAB — ROCKY MTN SPOTTED FVR ABS PNL(IGG+IGM)
RMSF IgG: NEGATIVE
RMSF IgM: 1.09 index — ABNORMAL HIGH (ref 0.00–0.89)

## 2017-03-24 LAB — VITAMIN B12: VITAMIN B 12: 174 pg/mL — AB (ref 180–914)

## 2017-03-24 NOTE — Progress Notes (Signed)
Nutrition Follow-up  DOCUMENTATION CODES:   Not applicable  INTERVENTION:  Continue Ensure Enlive po BID, each supplement provides 350 kcal and 20 grams of protein.  Encourage adequate PO intake.   NUTRITION DIAGNOSIS:   Increased nutrient needs related to  (post op healing) as evidenced by estimated needs; ongoing  GOAL:   Patient will meet greater than or equal to 90% of their needs; met  MONITOR:   PO intake, Supplement acceptance, Labs, Weight trends, Skin, I & O's  REASON FOR ASSESSMENT:   Consult Assessment of nutrition requirement/status  ASSESSMENT:   66 y.o. female with medical history significant for chronic back pain, hypertension, anxiety, [place] mound spotted fever, depression, seizure disorder since emergency Department chief complaint of hip pain after fall. Initial evaluation reveals right hip fracture.   Meal completion has been 50%. Pt reports she has been eating better at meals. Pt reports consuming small frequently meals throughout the day at home. Pt reports she tries to consume high protein foods. Pt and family educated on high protein food items and the importance of adequate nutrition. Pt additionally reports she consumes Ensure on occasion when po intake is poor. Pt reports weight fluctuates and she loses weight easily however can gain weight easily as well.   Unable to complete Nutrition-Focused physical exam at this time.  Diet Order:  Diet regular Room service appropriate? Yes; Fluid consistency: Thin  Skin:   (Incision L thigh)  Last BM:  9/17  Height:   Ht Readings from Last 1 Encounters:  03/21/17 '5\' 7"'$  (1.702 m)    Weight:   Wt Readings from Last 1 Encounters:  03/21/17 138 lb 3.2 oz (62.7 kg)    Ideal Body Weight:  61.36 kg  BMI:  Body mass index is 21.65 kg/m.  Estimated Nutritional Needs:   Kcal:  1700-1900  Protein:  75-90 grams  Fluid:  1.7 - 1.9 L/day  EDUCATION NEEDS:   Education needs addressed  Corrin Parker, MS, RD, LDN Pager # [phone] After hours/ weekend pager # [phone]

## 2017-03-24 NOTE — Progress Notes (Signed)
PROGRESS NOTE  Kelli Navarro  NWG:[id number] DOB: [birth year] DOA: 03/21/2017 PCP: Rubie Maid, MD  Brief Narrative: Kelli Navarro is a 66 y.o. female with a history of chronic back pain, seizure disorder, HTN, anxiety/depression, and remote treated RMSF who presented with left hip pain after a fall at home, found to have a displaced femoral neck fracture. She underwent left THA 9/16 by Dr. Lyla Glassing. She was also found to have right foot drop and fracture of 5th metatarsal. She and her wife report seizure-like activity several times per day for several months. The patient is essentially bed bound due to this.   Assessment & Plan: Principal Problem:   Femur fracture (HCC) Active Problems:   Seizure-like activity (HCC)   Adjustment disorder with disturbance of emotion   Fracture of 5th metatarsal   Hypokalemia   Foot drop   Displaced fracture of left femoral neck (HCC)   Seizure disorder (HCC)  Closed, displaced left femoral neck fracture: s/p anterior-approach left THA 9/16 by Dr. Lyla Glassing.  - Therapy evaluations ongoing. I have asked for CIR evaluation, anticipate stability for discharge 9/19.  - WBTA bilaterally with right post-op shoe and walker. - Pain control per orthopedics - ASA 81mg  po BID for DVT ppx per orthopedics  Seizure disorder: Has been seen by neurology, Dr. [name] for complex partial seizures in the past, though seems to have undergone change of doctors per pt preference. Notes increased frequency of seizures, foot drop, and now resolved slurring of speech. CT negative.  - Will continue her only outpatient medication, clonazepam to avoid withdrawal.  - If seizure activity is witnessed, would need formal consult to neurology. - Discussed 9/18 with Dr. Hilma Favors who will attempt to arrange follow up with Neurology, Dr. Narda Amber.  Right foot drop: MRI of lumbar spine unrevealing.  - MRI brain without culprit lesion. Results discussed with neurology, Dr.  Rory Percy, due to imaging findings consistent with hypertensive hemorrhages without history of sustained HTN. Specifically asked whether MS could be considered. He states the imaging findings are not at all characteristic of demyelinating disease.  - MRI C-spine today.  - If work up unrevealing, neurology recommends outpatient EMG, nerve conduction study.  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 Titers checked without evidence of active infection, discussed with ID, Dr. Linus Salmons. IgM is equivocal (negative in setting of no active infection) and IgG negative (indicating no prior infection.  - No further testing or treatment is needed  Chronic pain syndrome: Possibly opioid use disorder. Last MRI LS spine prior to admit from 02/03/2017 with mild multilevel lumbar disc and facet degeneration, no definite neural impingement. - Continue nucynta per home regimen in addition to medications acutely as above.   Nondisplaced left 5th metatarsal fracture:  - Post op shoe recommended per ortho  Anxiety/depression - Also contributing to all of above  DVT prophylaxis: Aspirin 81mg  BID Code Status: Full Family Communication: Wife at bedside Disposition Plan: Pending results of cervical MRI and formal evaluation by CIR.   Consultants:   Orthopedics, [place]  Neurology, [place] by phone 9/17 and 9/18  ID, Dr. Linus Salmons by phone 9/18  Dr. Rhea Pink by phone 9/17 and 9/18  Procedures:   Left anterior-approach THA 9/16  Antimicrobials:  None  Subjective: Pain continues to be controlled, working with PT. Feels her left foot drop is improving. Her wife does not feel she can take care of her adequately at  home in her current state. Still no witnessed seizures  Objective: BP 105/66   Pulse 88   Temp 98.5 F (36.9 C) (Oral)   Resp 16   Ht 5\' 7"  (1.702 m)   Wt 62.7 kg (138 lb 3.2 oz)   SpO2 91%   BMI  21.65 kg/m   Gen: 66 y.o. female in no distress. Pulm: Non-labored breathing room air. Clear to auscultation bilaterally.  CV: Regular rate and rhythm. No murmur, rub, or gallop. No JVD, no pedal edema. GI: Abdomen soft, non-tender, non-distended, with normoactive bowel sounds. No organomegaly or masses felt. Ext: Warm, no deformities. Left lateral incision c/d/i.  Skin: No rashes, lesions no ulcers Neuro: Alert and oriented. Right foot drop 3/5 strength, sensation intact, left diminished strength in leg throughout including foot dorsiflexion, though this is improved from yesterday to 4/5 (possibly effort-dependent). Reported paresthetic LLE sensation unchanged. Psych: Judgement and insight appear normal. Mood & affect appropriate.   Data Reviewed: I have personally reviewed following labs and imaging studies  CBC:  Recent Labs Lab 03/21/17 1108 03/22/17 0311 03/23/17 0221 03/24/17 0314  WBC 5.0 5.7 8.7 6.1  NEUTROABS 4.0  --   --   --   HGB 12.8 12.5 9.3* 7.9*  HCT 41.3 38.9 29.3* 25.1*  MCV 87.7 87.2 86.2 86.9  PLT 221 216 190 401   Basic Metabolic Panel:  Recent Labs Lab 03/21/17 1118 03/21/17 2017 03/22/17 0311 03/23/17 0221 03/24/17 0314  NA 140  --  141 141 143  K 3.0*  --  3.7 3.8 3.5  CL 104  --  108 111 113*  CO2 24  --  28 25 27   GLUCOSE 97  --  96 107* 87  BUN 13  --  8 14 17   CREATININE 0.70  --  0.71 0.72 0.62  CALCIUM 8.6*  --  8.8* 8.3* 7.9*  MG  --  1.8  --   --   --    GFR: Estimated Creatinine Clearance: 67.3 mL/min (by C-G formula based on SCr of 0.62 mg/dL). Liver Function Tests:  Recent Labs Lab 03/21/17 1118 03/22/17 0311  AST 20 16  ALT 13* 11*  ALKPHOS 93 82  BILITOT 1.4* 1.4*  PROT 7.0 6.1*  ALBUMIN 3.4* 3.0*   Radiology Studies: Mr Brain Wo Contrast  Result Date: 03/24/2017 CLINICAL DATA:  Seizures.  Right lower extremity motor deficit. EXAM: MRI HEAD WITHOUT CONTRAST TECHNIQUE: Multiplanar, multiecho pulse sequences of the  brain and surrounding structures were obtained without intravenous contrast. COMPARISON:  Head CT 01/01/2017 FINDINGS: Brain: The midline structures are normal. There is no focal diffusion restriction to indicate acute infarct. There is early confluent periventricular and deep white matter leukoaraiosis, most commonly seen in the setting of chronic hypertensive microangiopathy. No intraparenchymal hematoma or chronic microhemorrhage. Brain volume is normal for age without lobar predominant atrophy. The dura is normal and there is no extra-axial collection. Vascular: Major intracranial arterial and venous sinus flow voids are preserved. Skull and upper cervical spine: The visualized skull base, calvarium, upper cervical spine and extracranial soft tissues are normal. Sinuses/Orbits: No fluid levels or advanced mucosal thickening. No mastoid or middle ear effusion. Normal orbits. IMPRESSION: 1. No acute intracranial abnormality. 2. Sequelae of chronic hypertensive microangiopathy. 3. No specific seizure etiology identified. Electronically Signed   By: Ulyses Jarred M.D.   On: 03/24/2017 05:46    LOS: 3 days   Time spent: 35 minutes.  Vance Gather, MD [facility] Pager [phone]  If 7PM-7AM, please contact night-coverage [email or web address] Password [email or web address] 03/24/2017, 1:16 PM

## 2017-03-24 NOTE — Progress Notes (Signed)
Physical Therapy Treatment Patient Details Name: Kelli Navarro MRN: [id number] DOB: April 09, 1951 Today's Date: 03/24/2017    History of Present Illness 66 y.o. female admitted with seizure and fall with left femur fx and right 5th metatarsal fx s/p Left THA anterior approach. PMHx: chronic back pain, hypertension, anxiety, [place] mound spotted fever, depression, seizure disorder     PT Comments    Pt pleasant and agreeable to mobility. Pt continues to demonstrate bil foot drop which pt again states was not present prior to fall. Pt with 2/5 dorsiflexion RLE supine and 1/5 dorsiflexion LLE supine however, after walking and with cues in sitting pt able to achieve 3/5 dorsiflexion LLE despite continued foot drop with gait. Pt also demonstrates ability for left hip adduction in supine however, with transition to EOB requiring adduction she maintains the leg stiff and will not adduct. Pt demonstrates inconsistencies in strength and function of LLE and requires assist for all mobility, HEP and gait. Pt also with decreased cognition and memory with pt stating family can provide 24 hr care yet no one present to confirm. If pt does not have 24hr assist SNF recommended for pt safety and required assist for mobility. Will continue to follow.     Follow Up Recommendations  Home health PT;Supervision/Assistance - 24 hour;SNF (pt reports she will decline SNF and can arrange 24hrs. If 24hr assist unavailable then home is not a safe D/C)     Equipment Recommendations  Rolling walker with 5" wheels    Recommendations for Other Services       Precautions / Restrictions Precautions Precautions: Fall Other Brace/Splint: post op shoe right Restrictions RLE Weight Bearing: Weight bearing as tolerated LLE Weight Bearing: Weight bearing as tolerated    Mobility  Bed Mobility Overal bed mobility: Needs Assistance Bed Mobility: Supine to Sit     Supine to sit: Min assist     General bed mobility  comments: assist to move LLE to EOB, increased time and use of rail   Transfers Overall transfer level: Needs assistance   Transfers: Sit to/from Stand Sit to Stand: Min assist         General transfer comment: cues for hand placement with min assist to rise and steady, pt with initial posterior LOB on standing and required assist to correct  Ambulation/Gait Ambulation/Gait assistance: Min assist Ambulation Distance (Feet): 110 Feet Assistive device: Rolling walker (2 wheeled) Gait Pattern/deviations: Step-to pattern;Decreased dorsiflexion - right;Decreased dorsiflexion - left;Trunk flexed;Decreased step length - right;Decreased step length - left Gait velocity: grossly 34min to achieve 50' Gait velocity interpretation: Below normal speed for age/gender General Gait Details: pt with difficulty clearing bil LE with stepping, cues for posture and position in RW, tendency to drag LLE and circumduct with foot drop, pt with food drop on RLE with steppage gait on RLE,  min assist to direct and manage RW with increased cues and assist with turning   Stairs Stairs: Yes   Stair Management: Step to pattern;Forwards;Two rails Number of Stairs: 5 General stair comments: cues for sequence throughout as pt easily forgets and needs reinforcement of sequence  Wheelchair Mobility    Modified Rankin (Stroke Patients Only)       Balance Overall balance assessment: Needs assistance   Sitting balance-Leahy Scale: Good       Standing balance-Leahy Scale: Poor  Cognition Arousal/Alertness: Awake/alert Behavior During Therapy: WFL for tasks assessed/performed Overall Cognitive Status: Impaired/Different from baseline Area of Impairment: Problem solving                         Safety/Judgement: Decreased awareness of safety;Decreased awareness of deficits   Problem Solving: Slow processing;Difficulty sequencing;Requires verbal  cues General Comments: pt repeating statements provided yesterday, continues to need cues and assist with problem solving turning and movement with RW      Exercises General Exercises - Lower Extremity Heel Slides: AAROM;Left;Supine;10 reps Hip ABduction/ADduction: AAROM;Left;Supine;10 reps Hip Flexion/Marching: AAROM;Left;Seated;10 reps Toe Raises: AAROM;Left;Seated;10 reps    General Comments        Pertinent Vitals/Pain Pain Score: 4  Pain Location: Left chest Pain Descriptors / Indicators: Aching;Sore Pain Intervention(s): Limited activity within patient's tolerance;Repositioned;Monitored during session    Home Living                      Prior Function            PT Goals (current goals can now be found in the care plan section) Progress towards PT goals: Progressing toward goals    Frequency    7X/week      PT Plan Current plan remains appropriate    Co-evaluation              AM-PAC PT "6 Clicks" Daily Activity  Outcome Measure  Difficulty turning over in bed (including adjusting bedclothes, sheets and blankets)?: A Lot Difficulty moving from lying on back to sitting on the side of the bed? : Unable Difficulty sitting down on and standing up from a chair with arms (e.g., wheelchair, bedside commode, etc,.)?: Unable Help needed moving to and from a bed to chair (including a wheelchair)?: A Little Help needed walking in hospital room?: A Little Help needed climbing 3-5 steps with a railing? : A Little 6 Click Score: 13    End of Session Equipment Utilized During Treatment: Gait belt Activity Tolerance: Patient tolerated treatment well Patient left: in chair;with chair alarm set;with call bell/phone within reach Nurse Communication: Mobility status;Weight bearing status PT Visit Diagnosis: Other abnormalities of gait and mobility (R26.89);Difficulty in walking, not elsewhere classified (R26.2);Repeated falls (R29.6)     Time:  3382-5053 PT Time Calculation (min) (ACUTE ONLY): 42 min  Charges:  $Gait Training: 23-37 mins $Therapeutic Exercise: 8-22 mins                    G Codes:       Elwyn Reach, PT [phone]   [place] 03/24/2017, 10:57 AM

## 2017-03-24 NOTE — Consult Note (Signed)
Physical Medicine and Rehabilitation Consult Reason for Consult: Decreased functional mobility Referring Physician: [facility]   HPI: Kelli Navarro is a 66 y.o. right handed female with history of ADHD, hypertension, chronic back pain, seizure disorder, [facility] spotted fever. Per chart review patient lives with significant other. At baseline she is independent with ADLs and functional mobility. Presented in 03/21/2017 after a fall while getting up to the bathroom. Question of seizure related as patient had stopped taking all of her medications 2 months ago. X-rays showed displaced left femoral neck fracture. Underwent left total hip arthroplasty anterior approach 03/22/2017 per Dr. Lyla Glassing. Hospital course pain management. MRI of the brain 03/23/2017 secondary to right lower extremity weakness with foot drop was negative for acute changes. MRI lumbar spine unrevealing as well.  Patient is weightbearing as tolerated. Maintain on Lovenox for DVT prophylaxis. Physical and occupational therapy evaluations completed. M.D. has requested physical medicine rehabilitation consult.   Review of Systems  Constitutional: Negative for chills and fever.  HENT: Negative for hearing loss.   Eyes: Negative for blurred vision and double vision.  Respiratory: Negative for cough and shortness of breath.   Cardiovascular: Negative for chest pain, palpitations and leg swelling.  Gastrointestinal: Positive for constipation. Negative for nausea and vomiting.  Genitourinary: Negative for flank pain and hematuria.  Musculoskeletal: Positive for back pain and falls.  Skin: Negative for rash.  Neurological: Positive for seizures.  Psychiatric/Behavioral: Positive for depression.       ADHD  All other systems reviewed and are negative.  Past Medical History:  Diagnosis Date  . ADHD   . Chronic back pain   . Depression   . Hypertension   . Right femoral fracture ([place])   . [facility] spotted fever    . Seizures ([place])    Past Surgical History:  Procedure Laterality Date  . none    . TOTAL HIP ARTHROPLASTY Left 03/22/2017   Procedure: TOTAL HIP ARTHROPLASTY ANTERIOR APPROACH;  Surgeon: Rod Can, MD;  Location: [place];  Service: Orthopedics;  Laterality: Left;   Family History  Problem Relation Age of Onset  . Multiple sclerosis Mother   . Heart Problems Mother        by-pass  . Heart Problems Sister        pacemaker   Social History:  reports that she has never smoked. She has never used smokeless tobacco. She reports that she does not drink alcohol or use drugs. Allergies:  Allergies  Allergen Reactions  . Aptiom [Eslicarbazepine]   . Bupropion Other (See Comments)     Caused depression  . Dilantin [Phenytoin Sodium Extended]   . Diphenhydramine Other (See Comments)    Feels like "spiders are crawling" on her  . Divalproex Sodium   . Keppra [Levetiracetam]   . Lamictal [Lamotrigine]   . Lorazepam Swelling    Hallucinations  . Lyrica [Pregabalin] Other (See Comments)    Pt reports feeling loopy and confused  . Morphine And Related     At high doses  . Other     Allergy to steroids causing anaphylaxis    . Prednisone    Medications Prior to Admission  Medication Sig Dispense Refill  . amphetamine-dextroamphetamine (ADDERALL) 20 MG tablet Take 20 mg by mouth 3 (three) times daily.    Marland Kitchen aspirin 81 MG tablet Take 81 mg by mouth daily.    . clonazePAM (KLONOPIN) 1 MG tablet Take 1 mg by mouth 4 (four) times daily.    Marland Kitchen  metoprolol succinate (TOPROL-XL) 50 MG 24 hr tablet Take 50 mg by mouth daily. Take with or immediately following a meal.    . NUCYNTA 50 MG tablet Take 50 mg by mouth QID. Taken with meals  0  . albuterol (PROVENTIL HFA;VENTOLIN HFA) 108 (90 BASE) MCG/ACT inhaler Inhale 2 puffs into the lungs every 6 (six) hours as needed for wheezing.      Home: Home Living Family/patient expects to be discharged to:: Private residence Living Arrangements:  Spouse/significant other Available Help at Discharge: Family, Available 24 hours/day Type of Home: House Home Access: Stairs to enter Technical brewer of Steps: 7 Home Layout: Two level, Bed/bath upstairs Alternate Level Stairs-Number of Steps: 14 Bathroom Shower/Tub: Multimedia programmer: Standard Home Equipment: Bedside commode, Shower seat  Functional History: Prior Function Level of Independence: Independent Comments: pt spends a lot of time in bed due to 5-7 seizures per day Functional Status:  Mobility: Bed Mobility Overal bed mobility: Needs Assistance Bed Mobility: Supine to Sit Supine to sit: Min assist Sit to supine: Min assist General bed mobility comments: assist to move LLE to EOB, increased time and use of rail  Transfers Overall transfer level: Needs assistance Transfers: Sit to/from Stand Sit to Stand: Min assist General transfer comment: cues for hand placement with min assist to rise and steady, pt with initial posterior LOB on standing and required assist to correct Ambulation/Gait Ambulation/Gait assistance: Min assist Ambulation Distance (Feet): 110 Feet Assistive device: Rolling walker (2 wheeled) Gait Pattern/deviations: Step-to pattern, Decreased dorsiflexion - right, Decreased dorsiflexion - left, Trunk flexed, Decreased step length - right, Decreased step length - left General Gait Details: pt with difficulty clearing bil LE with stepping, cues for posture and position in RW, tendency to drag LLE and circumduct with foot drop, pt with food drop on RLE with steppage gait on RLE,  min assist to direct and manage RW with increased cues and assist with turning Gait velocity: grossly 87min to achieve 50' Gait velocity interpretation: Below normal speed for age/gender Stairs: Yes Stairs assistance: Min guard Stair Management: Step to pattern, Forwards, Two rails Number of Stairs: 5 General stair comments: cues for sequence throughout as pt  easily forgets and needs reinforcement of sequence    ADL: ADL Overall ADL's : Needs assistance/impaired Eating/Feeding: Set up, Sitting Grooming: Set up, Sitting Upper Body Bathing: Min guard, Sitting Lower Body Bathing: Minimal assistance, Sit to/from stand Upper Body Dressing : Min guard, Sitting Lower Body Dressing: Moderate assistance, Sit to/from stand Lower Body Dressing Details (indicate cue type and reason): Pt able to doff sock on L foot, requires assist to don new sock, MinA for locating where to remove velcro straps when doffing R post-op shoe  Toilet Transfer: Minimal assistance, Stand-pivot, BSC, RW Toilet Transfer Details (indicate cue type and reason): simulated in sit<>stand transfer from EOB  Toileting- Clothing Manipulation and Hygiene: Minimal assistance, Sit to/from stand Functional mobility during ADLs: Minimal assistance, Rolling walker General ADL Comments: Pt attempted ambulation within room towards bathroom, took approx 2 steps however demonstrating increased difficulty advancing LLE forward, therefore further mobility deferred, pt able to return to sitting EOB with MinA (Pt with less difficulty sliding L foot backwards when backing up to EOB)   Cognition: Cognition Overall Cognitive Status: Impaired/Different from baseline Orientation Level: Oriented X4 Cognition Arousal/Alertness: Awake/alert Behavior During Therapy: WFL for tasks assessed/performed Overall Cognitive Status: Impaired/Different from baseline Area of Impairment: Problem solving Safety/Judgement: Decreased awareness of safety, Decreased awareness of deficits Problem  Solving: Slow processing, Difficulty sequencing, Requires verbal cues General Comments: pt repeating statements provided yesterday, continues to need cues and assist with problem solving turning and movement with RW  Blood pressure 105/66, pulse 88, temperature 98.5 F (36.9 C), temperature source Oral, resp. rate 16, height 5\' 7"   (1.702 m), weight 62.7 kg (138 lb 3.2 oz), SpO2 91 %. Physical Exam  Constitutional: She is oriented to person, place, and time.  HENT:  Head: Normocephalic.  Eyes: EOM are normal.  Neck: Normal range of motion. Neck supple. No thyromegaly present.  Cardiovascular: Normal rate, regular rhythm and normal heart sounds.   Respiratory: Effort normal and breath sounds normal. No respiratory distress.  GI: Soft. Bowel sounds are normal. She exhibits no distension.  Neurological: She is alert and oriented to person, place, and time.  Follows commands. Normal insight and awareness. No CN findings. UE motor 5/5. Normal reflexes. RLE: 3/5 to 4/5 HF, KE and tr ADF/PF. LLE: limited proximally by pain, ADF/PF 3- to 3/5. Decreased LT along antero-medial thigh and medial leg but not to foot. DTR's 1+.   Skin:  Left lower extremity surgical incision site clean and dry  Psychiatric: She has a normal mood and affect. Her behavior is normal. Thought content normal.    Results for orders placed or performed during the hospital encounter of 03/21/17 (from the past 24 hour(s))  CBC     Status: Abnormal   Collection Time: 03/24/17  3:14 AM  Result Value Ref Range   WBC 6.1 4.0 - 10.5 K/uL   RBC 2.89 (L) 3.87 - 5.11 MIL/uL   Hemoglobin 7.9 (L) 12.0 - 15.0 g/dL   HCT 25.1 (L) 36.0 - 46.0 %   MCV 86.9 78.0 - 100.0 fL   MCH 27.3 26.0 - 34.0 pg   MCHC 31.5 30.0 - 36.0 g/dL   RDW 14.3 11.5 - 15.5 %   Platelets 196 150 - 400 K/uL  Basic metabolic panel     Status: Abnormal   Collection Time: 03/24/17  3:14 AM  Result Value Ref Range   Sodium 143 135 - 145 mmol/L   Potassium 3.5 3.5 - 5.1 mmol/L   Chloride 113 (H) 101 - 111 mmol/L   CO2 27 22 - 32 mmol/L   Glucose, Bld 87 65 - 99 mg/dL   BUN 17 6 - 20 mg/dL   Creatinine, Ser 0.62 0.44 - 1.00 mg/dL   Calcium 7.9 (L) 8.9 - 10.3 mg/dL   GFR calc non Af Amer >60 >60 mL/min   GFR calc Af Amer >60 >60 mL/min   Anion gap 3 (L) 5 - 15   Mr Brain Wo  Contrast  Result Date: 03/24/2017 CLINICAL DATA:  Seizures.  Right lower extremity motor deficit. EXAM: MRI HEAD WITHOUT CONTRAST TECHNIQUE: Multiplanar, multiecho pulse sequences of the brain and surrounding structures were obtained without intravenous contrast. COMPARISON:  Head CT 01/01/2017 FINDINGS: Brain: The midline structures are normal. There is no focal diffusion restriction to indicate acute infarct. There is early confluent periventricular and deep white matter leukoaraiosis, most commonly seen in the setting of chronic hypertensive microangiopathy. No intraparenchymal hematoma or chronic microhemorrhage. Brain volume is normal for age without lobar predominant atrophy. The dura is normal and there is no extra-axial collection. Vascular: Major intracranial arterial and venous sinus flow voids are preserved. Skull and upper cervical spine: The visualized skull base, calvarium, upper cervical spine and extracranial soft tissues are normal. Sinuses/Orbits: No fluid levels or advanced mucosal thickening. No  mastoid or middle ear effusion. Normal orbits. IMPRESSION: 1. No acute intracranial abnormality. 2. Sequelae of chronic hypertensive microangiopathy. 3. No specific seizure etiology identified. Electronically Signed   By: Ulyses Jarred M.D.   On: 03/24/2017 05:46   Pelvis Portable  Result Date: 03/22/2017 CLINICAL DATA:  Postoperative assessment the pelvis, left total hip arthroplasty EXAM: PORTABLE PELVIS 1-2 VIEWS COMPARISON:  Intraoperative images from 03/22/2017 FINDINGS: Left hip prosthesis is in place with good positioning and alignment. No acute fracture or early complicating feature identified. IMPRESSION: 1. Left total hip prosthesis in place without early complicating feature observed. Electronically Signed   By: Van Clines M.D.   On: 03/22/2017 12:59    Assessment/Plan: Diagnosis: Functional and gait deficits related to left FNF and subsequent THA with superimposed left sided  sensory loss and right foot drop. ?etiology 1. Does the need for close, 24 hr/day medical supervision in concert with the patient's rehab needs make it unreasonable for this patient to be served in a less intensive setting? Yes 2. Co-Morbidities requiring supervision/potential complications: seizure disorder. RMSF?, htn, psychological overlay 3. Due to bladder management, bowel management, safety, skin/wound care, disease management, medication administration, pain management and patient education, does the patient require 24 hr/day rehab nursing? Yes 4. Does the patient require coordinated care of a physician, rehab nurse, PT (1-2 hrs/day, 5 days/week) and OT (1-2 hrs/day, 5 days/week) to address physical and functional deficits in the context of the above medical diagnosis(es)? Yes Addressing deficits in the following areas: balance, endurance, locomotion, strength, transferring, bowel/bladder control, bathing, dressing, feeding, grooming, toileting and psychosocial support 5. Can the patient actively participate in an intensive therapy program of at least 3 hrs of therapy per day at least 5 days per week? Yes 6. The potential for patient to make measurable gains while on inpatient rehab is good 7. Anticipated functional outcomes upon discharge from inpatient rehab are modified independent  with PT, modified independent and supervision with OT, n/a with SLP. 8. Estimated rehab length of stay to reach the above functional goals is: 7-10 days 9. Anticipated D/C setting: Home 10. Anticipated post D/C treatments: [place] therapy 11. Overall Rehab/Functional Prognosis: excellent  RECOMMENDATIONS: This patient's condition is appropriate for continued rehabilitative care in the following setting: CIR Patient has agreed to participate in recommended program. Yes Note that insurance prior authorization may be required for reimbursement for recommended care.  Comment: Rehab Admissions Coordinator to follow  up.  Thanks,  Meredith Staggers, MD, Mellody Drown    Cathlyn Parsons., PA-C 03/24/2017

## 2017-03-24 NOTE — Progress Notes (Signed)
Occupational Therapy Treatment Patient Details Name: Kelli Navarro MRN: [id number] DOB: 07-25-1950 Today's Date: 03/24/2017    History of present illness 66 y.o. female admitted with seizure and fall with left femur fx and right 5th metatarsal fx s/p Left THA anterior approach. PMHx: chronic back pain, hypertension, anxiety, [place] spotted fever, depression, seizure disorder    OT comments  Pt progressing towards goals, able to complete room level functional mobility and functional mobility transfers at RW level with MinA this session, requires increased time for mobility due to LE impairments. Issued level 2 theraband and educated Pt on bil UE HEP for increasing UE strength/endurance in preparation for ADL task completion and functional mobility. D/c recs have been updated to reflect Pt's current assist level/Pt progress. Feel Pt will benefit from continued acute OT services and feel Pt will benefit from CIR services prior to return home to maximize Pt's safety and independence with ADLs and functional mobility.    Follow Up Recommendations  CIR;Supervision/Assistance - 24 hour (vs HHOT (pending Pt progress) )    Equipment Recommendations  None recommended by OT;Other (comment) (Pt has needed DME )          Precautions / Restrictions Precautions Precautions: Fall Required Braces or Orthoses: Other Brace/Splint Other Brace/Splint: post op shoe right Restrictions Weight Bearing Restrictions: Yes RLE Weight Bearing: Weight bearing as tolerated LLE Weight Bearing: Weight bearing as tolerated       Mobility Bed Mobility Overal bed mobility: Needs Assistance Bed Mobility: Supine to Sit     Supine to sit: Min assist Sit to supine: Min assist   General bed mobility comments: assist to move LLE to EOB, increased time and use of rail   Transfers Overall transfer level: Needs assistance   Transfers: Sit to/from Stand Sit to Stand: Min assist         General transfer  comment: cues for hand placement with min assist to rise and steady    Balance Overall balance assessment: Needs assistance Sitting-balance support: Feet supported;Single extremity supported Sitting balance-Leahy Scale: Good       Standing balance-Leahy Scale: Poor Standing balance comment: reliant on UE support during mobility                            ADL either performed or assessed with clinical judgement   ADL Overall ADL's : Needs assistance/impaired                     Lower Body Dressing: Minimal assistance;Sit to/from stand Lower Body Dressing Details (indicate cue type and reason): Pt dons R post op shoe sitting EOB using figure 4 technique and with close guard for safety  Toilet Transfer: Minimal assistance;Ambulation;Regular Toilet;RW Toilet Transfer Details (indicate cue type and reason): verbal cues for hand placement  Toileting- Clothing Manipulation and Hygiene: Minimal assistance;Sit to/from stand       Functional mobility during ADLs: Minimal assistance;Rolling walker General ADL Comments: Improvements in mobility noted this session, ambulating to bathroom with RW and MinA for toilet transfer; issued Pt level 2 theraband for bil UE ther ex to increase UE strength and overall activity tolerance for ADL task completion                        Cognition Arousal/Alertness: Awake/alert Behavior During Therapy: WFL for tasks assessed/performed Overall Cognitive Status: Impaired/Different from baseline Area of Impairment: Problem solving  Safety/Judgement: Decreased awareness of safety;Decreased awareness of deficits   Problem Solving: Slow processing;Difficulty sequencing;Requires verbal cues          Exercises General Exercises - Upper Extremity Shoulder Flexion: AROM;Both;10 reps;Theraband;Seated Theraband Level (Shoulder Flexion): Level 2 (Red) Shoulder Extension: AROM;Both;10  reps;Seated;Theraband Theraband Level (Shoulder Extension): Level 2 (Red) Shoulder Horizontal ABduction: AROM;10 reps;Both;Theraband Theraband Level (Shoulder Horizontal Abduction): Level 2 (Red) Shoulder Horizontal ADduction: AROM;10 reps;Both;Seated;Theraband Theraband Level (Shoulder Horizontal Adduction): Level 2 (Red) Elbow Flexion: AROM;10 reps;Both;Seated;Theraband Theraband Level (Elbow Flexion): Level 2 (Red) Elbow Extension: AROM;10 reps;Both;Seated;Theraband Theraband Level (Elbow Extension): Level 2 (Red)          General Comments Pt's spouse present during session     Pertinent Vitals/ Pain       Pain Assessment: Faces Faces Pain Scale: Hurts a little bit Pain Location: L rib cage  Pain Descriptors / Indicators: Aching;Sore Pain Intervention(s): Limited activity within patient's tolerance;Monitored during session                                                          Frequency  Min 2X/week        Progress Toward Goals  OT Goals(current goals can now be found in the care plan section)  Progress towards OT goals: Progressing toward goals  Acute Rehab OT Goals Patient Stated Goal: return home OT Goal Formulation: With patient Time For Goal Achievement: 04/06/17 Potential to Achieve Goals: Good  Plan Discharge plan needs to be updated                     AM-PAC PT "6 Clicks" Daily Activity     Outcome Measure   Help from another person eating meals?: None Help from another person taking care of personal grooming?: A Little Help from another person toileting, which includes using toliet, bedpan, or urinal?: A Little Help from another person bathing (including washing, rinsing, drying)?: A Lot Help from another person to put on and taking off regular upper body clothing?: A Little Help from another person to put on and taking off regular lower body clothing?: A Lot 6 Click Score: 17    End of Session Equipment  Utilized During Treatment: Gait belt;Rolling walker;Other (comment) (post op shoe )  OT Visit Diagnosis: Other abnormalities of gait and mobility (R26.89);Unsteadiness on feet (R26.81)   Activity Tolerance Patient tolerated treatment well   Patient Left in bed;with call bell/phone within reach;with family/visitor present   Nurse Communication Mobility status        Time: 9381-0175 OT Time Calculation (min): 40 min  Charges: OT General Charges $OT Visit: 1 Visit OT Treatments $Self Care/Home Management : 8-22 mins $Therapeutic Exercise: 8-22 mins  Lou Cal, OT Pager [phone] 03/24/2017    Raymondo Band 03/24/2017, 3:15 PM

## 2017-03-24 NOTE — Progress Notes (Signed)
Patient seen and evaluated and care discussed with her partner at bedside. She did well through surgery and appears to doing better than I expected based on the condition that I saw her in when she arrived in the ER. I confirmed parts of my initial history.   We talked about the following issues:  1. Her Hip Fx/ current injury- pain is well controlled on minimal opioids, she is most worried about her foot drop and persistent numbness following PT session today- she is afraid of falling again- anticipatory anxiety has been debilitating for her. Her partner expressed concerns about caring for her at home- she has physical deficits from a work related accident and has shoulder and arm issues.She is also burned out and not available 24/7. Until she is more stable would she benefit from a short rehab stay in CIR? She could benefit from specialized PMR given her history and pain issues-would also give [place] an opportunity to observe her for an seizure like activity or fall risk.  2. I approached opioid use directly I shared my concerns and also discussed psuedo-addiction behaviors which I am much more convinced this is the issue.  3. Seizure vs. Siezure-like activity- I reviewed her 12 Lead EKG- it is ABNORMAL- prolonged QTc, LAFB, PVCs . Would be reasonable for her to be seen again by cardiology maybe even watch on telemetry or loop recorder outpatient- curious if her "attacks" may actually be syncope.   4. 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 No MRI of her brain done in past. Sub-optimal new onset seizure eval done historically. Given foot drop will order MRI and obtain neuro consult.  Will follow in post acute care setting.  Lane Hacker, DO Palliative Medicine [phone]  Time: 35 min Greater than 50%  of this time was spent counseling and coordinating care related to the above  assessment and plan.

## 2017-03-24 NOTE — Progress Notes (Signed)
   Subjective:  Patient reports pain as mild to moderate.  C/o LE weakness.  Objective:   VITALS:   Vitals:   03/23/17 1606 03/23/17 2130 03/24/17 0419 03/24/17 0914  BP: (!) 90/53 (!) 95/54 98/63 105/66  Pulse: 79 75 72 88  Resp:  15 16   Temp: 99.4 F (37.4 C) 99 F (37.2 C) 98.5 F (36.9 C)   TempSrc: Oral Oral Oral   SpO2: 97% 98% 91%   Weight:      Height:        NAD ABD soft Sensation intact distally Intact pulses distally Incision: dressing C/D/I Compartment soft  BLE foot drop - unchanged from preop, R 3/5, L 4/5  Lab Results  Component Value Date   WBC 6.1 03/24/2017   HGB 7.9 (L) 03/24/2017   HCT 25.1 (L) 03/24/2017   MCV 86.9 03/24/2017   PLT 196 03/24/2017   BMET    Component Value Date/Time   NA 143 03/24/2017 0314   K 3.5 03/24/2017 0314   CL 113 (H) 03/24/2017 0314   CO2 27 03/24/2017 0314   GLUCOSE 87 03/24/2017 0314   BUN 17 03/24/2017 0314   CREATININE 0.62 03/24/2017 0314   CALCIUM 7.9 (L) 03/24/2017 0314   GFRNONAA >60 03/24/2017 0314   GFRAA >60 03/24/2017 0314     Assessment/Plan: 2 Days Post-Op   Principal Problem:   Femur fracture ([facility]) Active Problems:   Seizure-like activity ([facility])   Adjustment disorder with disturbance of emotion   Fracture of 5th metatarsal   Hypokalemia   Foot drop   Displaced fracture of left femoral neck ([facility])   Seizure disorder ([facility])   WBAT LLE with walker WBAT RLE in postop shoe BLE foot drop: per primary team, MRI brain (-), MRI L spine shows no critical neural impingement, I ordered MRI c spine today PO pain control DVT ppx: ASA 81 mg PO BID, SCDs, TEDs Dispo: D/C planning, ? SNF due to weakness / deconditioning  Kelli Navarro, Kelli Navarro 03/24/2017, 12:29 PM   Rod Can, MD Cell [phone]

## 2017-03-25 ENCOUNTER — Inpatient Hospital Stay (HOSPITAL_COMMUNITY): Payer: Medicare HMO

## 2017-03-25 LAB — CBC
HEMATOCRIT: 25.9 % — AB (ref 36.0–46.0)
HEMOGLOBIN: 8.1 g/dL — AB (ref 12.0–15.0)
MCH: 27.2 pg (ref 26.0–34.0)
MCHC: 31.3 g/dL (ref 30.0–36.0)
MCV: 86.9 fL (ref 78.0–100.0)
Platelets: 218 10*3/uL (ref 150–400)
RBC: 2.98 MIL/uL — ABNORMAL LOW (ref 3.87–5.11)
RDW: 14.4 % (ref 11.5–15.5)
WBC: 4.1 10*3/uL (ref 4.0–10.5)

## 2017-03-25 LAB — RPR: RPR Ser Ql: NONREACTIVE

## 2017-03-25 LAB — HIV ANTIBODY (ROUTINE TESTING W REFLEX): HIV Screen 4th Generation wRfx: NONREACTIVE

## 2017-03-25 MED ORDER — CYANOCOBALAMIN 1000 MCG/ML IJ SOLN
1000.0000 ug | Freq: Once | INTRAMUSCULAR | Status: AC
  Administered 2017-03-25: 1000 ug via INTRAMUSCULAR
  Filled 2017-03-25: qty 1

## 2017-03-25 NOTE — Progress Notes (Signed)
Physical Therapy Treatment Patient Details Name: Kelli Navarro MRN: [id number] DOB: 1950/08/07 Today's Date: 03/25/2017    History of Present Illness 66 y.o. female admitted with seizure and fall with left femur fx and right 5th metatarsal fx s/p Left THA anterior approach. PMHx: chronic back pain, hypertension, anxiety, [place] spotted fever, depression, seizure disorder     PT Comments    Pt progressing well. Pt with noted improved L foot drop however continues to have 1/5 in R dorsiflexors. Pt remains to require assist for all transfers, ambulation and stair negotiation. Pt was indep PTA. Pt to benefit from CIR upon d/c to achieve safe mod I level of function.  Follow Up Recommendations  CIR     Equipment Recommendations  Rolling walker with 5" wheels    Recommendations for Other Services OT consult     Precautions / Restrictions Precautions Precautions: Fall Required Braces or Orthoses: Other Brace/Splint Other Brace/Splint: post op shoe right Restrictions Weight Bearing Restrictions: Yes RLE Weight Bearing: Weight bearing as tolerated LLE Weight Bearing: Weight bearing as tolerated    Mobility  Bed Mobility Overal bed mobility: Needs Assistance Bed Mobility: Supine to Sit     Supine to sit: Min assist     General bed mobility comments: minA for L LE management only  Transfers Overall transfer level: Needs assistance Equipment used: Rolling walker (2 wheeled) Transfers: Sit to/from Stand Sit to Stand: Min assist         General transfer comment: v/c's for hand placement  Ambulation/Gait Ambulation/Gait assistance: Min assist Ambulation Distance (Feet): 60 Feet (x2 (to/from gym)) Assistive device: Rolling walker (2 wheeled) Gait Pattern/deviations: Step-to pattern;Decreased step length - right;Decreased step length - left;Decreased stance time - right;Decreased stance time - left;Steppage;Antalgic Gait velocity: slow Gait velocity interpretation:  Below normal speed for age/gender General Gait Details: pt with bilat foot drop however L foot improved compaired to yesterday. Had pt place shoe on L foot to even out leg length due to post op shoe on the R.    Stairs Stairs: Yes   Stair Management: One rail Left;Step to pattern;Sideways Number of Stairs: 2 General stair comments: v/c's for sequencing, pt with c/o L hip stabbing pain inside joint that started today  Wheelchair Mobility    Modified Rankin (Stroke Patients Only)       Balance               Standing balance comment: reliant on RW for safe amb/standing                            Cognition Arousal/Alertness: Awake/alert Behavior During Therapy: WFL for tasks assessed/performed Overall Cognitive Status: Impaired/Different from baseline Area of Impairment: Problem solving                             Problem Solving: Slow processing;Difficulty sequencing;Requires verbal cues        Exercises Total Joint Exercises Quad Sets: AROM;Left;10 reps;Supine Heel Slides: AROM;Left;10 reps;Standing Long Arc Quad: AROM;Left;10 reps;Seated Marching in Standing: AAROM;Left;10 reps;Standing    General Comments        Pertinent Vitals/Pain Pain Assessment: Faces Faces Pain Scale: Hurts even more Pain Location: L hip Pain Descriptors / Indicators: Stabbing Pain Intervention(s): Monitored during session    Home Living  Prior Function            PT Goals (current goals can now be found in the care plan section) Progress towards PT goals: Progressing toward goals    Frequency    7X/week      PT Plan Discharge plan needs to be updated    Co-evaluation              AM-PAC PT "6 Clicks" Daily Activity  Outcome Measure  Difficulty turning over in bed (including adjusting bedclothes, sheets and blankets)?: Unable Difficulty moving from lying on back to sitting on the side of the bed? :  Unable Difficulty sitting down on and standing up from a chair with arms (e.g., wheelchair, bedside commode, etc,.)?: A Lot Help needed moving to and from a bed to chair (including a wheelchair)?: A Little Help needed walking in hospital room?: A Little Help needed climbing 3-5 steps with a railing? : A Little 6 Click Score: 13    End of Session Equipment Utilized During Treatment: Gait belt Activity Tolerance: Patient tolerated treatment well Patient left: in chair;with call bell/phone within reach;with family/visitor present Nurse Communication: Mobility status;Weight bearing status PT Visit Diagnosis: Other abnormalities of gait and mobility (R26.89);Difficulty in walking, not elsewhere classified (R26.2);Repeated falls (R29.6) Pain - Right/Left: Left Pain - part of body: Hip     Time: 1130-1209 PT Time Calculation (min) (ACUTE ONLY): 39 min  Charges:  $Gait Training: 23-37 mins $Therapeutic Exercise: 8-22 mins                    G Codes:       Kittie Plater, PT, DPT Pager #: [phone] Office #: [phone]    [place] 03/25/2017, 2:15 PM

## 2017-03-25 NOTE — Progress Notes (Signed)
Rehab admissions - I met with patient and her family member.  They would like inpatient rehab here at [facility].  I have faxed information to [facility] requesting acute inpatient rehab admission.  I will update all once I hear back from insurance case manager.  Call me for questions.  #[phone]

## 2017-03-25 NOTE — Plan of Care (Signed)
Problem: Pain Managment: Goal: General experience of comfort will improve Patient voices understanding of pain scale and calls for pain medication when needed   

## 2017-03-25 NOTE — Social Work (Signed)
CSW provided patient the list of SNF's that have offered patient a bed. Patient will review and select a SNF.  CSW will continue to follow as CIR is reviewing case as well.  Elissa Hefty, LCSW Clinical Social Worker [phone]

## 2017-03-25 NOTE — NC FL2 (Signed)
[place] MEDICAID FL2 LEVEL OF CARE SCREENING TOOL     IDENTIFICATION  Patient Name: Kelli Navarro Birthdate: Feb 24, 1951 Sex: female Admission Date (Current Location): 03/21/2017  [facility] and [place] Number:  Herbalist and Address:  [facility], [place] [address] [place], [place] [address]      Provider Number: [id number]  Attending Physician Name and Address:  Patrecia Pour, MD  Relative Name and Phone Number:  Knute Neu, spouse [phone]    Current Level of Care: Hospital Recommended Level of Care: [place] Prior Approval Number:    Date Approved/Denied: 03/25/17 PASRR Number: [id number]  Discharge Plan: SNF    Current Diagnoses: Patient Active Problem List   Diagnosis Date Noted  . Seizure disorder ([place])   . Displaced fracture of left femoral neck ([place]) 03/22/2017  . Left displaced femoral neck fracture ([place]) 03/21/2017  . Fracture of 5th metatarsal 03/21/2017  . Hypokalemia 03/21/2017  . Femur fracture ([place]) 03/21/2017  . Foot drop 03/21/2017  . Hypertension   . Chronic back pain   . Adjustment disorder with disturbance of emotion 01/01/2017  . Seizure-like activity ([place]) 10/17/2012    Orientation RESPIRATION BLADDER Height & Weight     Self, Time, Situation, Place  Normal Indwelling catheter Weight: 138 lb 3.2 oz (62.7 kg) Height:  5\' 7"  (170.2 cm)  BEHAVIORAL SYMPTOMS/MOOD NEUROLOGICAL BOWEL NUTRITION STATUS      Continent Diet (See DC Summary)  AMBULATORY STATUS COMMUNICATION OF NEEDS Skin   Limited Assist Verbally Surgical wounds (Left Thigh, Silver Hydrofiber)                       Personal Care Assistance Level of Assistance  Dressing, Bathing Bathing Assistance: Limited assistance   Dressing Assistance: Limited assistance     Functional Limitations Info             SPECIAL CARE FACTORS FREQUENCY  OT (By licensed OT), PT (By licensed PT)     PT Frequency: 7xweek OT Frequency:  2x week            Contractures      Additional Factors Info  Code Status, Allergies, Psychotropic Code Status Info: Full code Allergies Info: APTIOM ESLICARBAZEPINE, BUPROPION, DILANTIN PHENYTOIN SODIUM EXTENDED, DIPHENHYDRAMINE, DIVALPROEX SODIUM, KEPPRA LEVETIRACETAM, LAMICTAL LAMOTRIGINE, LORAZEPAM, LYRICA PREGABALIN, MORPHINE AND RELATED, OTHER, PREDNISONE  Psychotropic Info: Klonopin         Current Medications (03/25/2017):  This is the current hospital active medication list Current Facility-Administered Medications  Medication Dose Route Frequency Provider Last Rate Last Dose  . acetaminophen (TYLENOL) tablet 650 mg  650 mg Oral Q6H PRN Domenic Polite, MD   650 mg at 03/22/17 2220   Or  . acetaminophen (TYLENOL) suppository 650 mg  650 mg Rectal Q6H PRN Domenic Polite, MD      . albuterol (PROVENTIL) (2.5 MG/3ML) 0.083% nebulizer solution 2.5 mg  2.5 mg Nebulization Q6H PRN Elodia Florence., MD      . amphetamine-dextroamphetamine (ADDERALL XR) 24 hr capsule 10 mg  10 mg Oral Daily Lane Hacker L, DO   10 mg at 03/25/17 0936  . aspirin EC tablet 81 mg  81 mg Oral BID WC Rod Can, MD   81 mg at 03/25/17 0935  . clonazePAM (KLONOPIN) tablet 1 mg  1 mg Oral QID Radene Gunning, NP   1 mg at 03/25/17 0936  . docusate sodium (COLACE) capsule 100 mg  100 mg  Oral BID Radene Gunning, NP   100 mg at 03/25/17 0962  . feeding supplement (ENSURE ENLIVE) (ENSURE ENLIVE) liquid 237 mL  237 mL Oral BID BM Patrecia Pour, MD      . HYDROcodone-acetaminophen (NORCO/VICODIN) 5-325 MG per tablet 1-2 tablet  1-2 tablet Oral Q6H PRN Radene Gunning, NP   2 tablet at 03/25/17 1220  . HYDROmorphone (DILAUDID) injection 0.5-1 mg  0.5-1 mg Intravenous Q2H PRN Rod Can, MD   1 mg at 03/22/17 1704  . lactated ringers infusion   Intravenous Continuous Rod Can, MD 10 mL/hr at 03/22/17 [phone]    . magnesium citrate solution 1 Bottle  1 Bottle Oral Once PRN Black, Lezlie Octave, NP       . menthol-cetylpyridinium (CEPACOL) lozenge 3 mg  1 lozenge Oral PRN Swinteck, Aaron Edelman, MD       Or  . phenol (CHLORASEPTIC) mouth spray 1 spray  1 spray Mouth/Throat PRN Swinteck, Aaron Edelman, MD      . methocarbamol (ROBAXIN) tablet 500 mg  500 mg Oral Q6H PRN Radene Gunning, NP   500 mg at 03/25/17 1220   Or  . methocarbamol (ROBAXIN) 500 mg in dextrose 5 % 50 mL IVPB  500 mg Intravenous Q6H PRN Black, Lezlie Octave, NP      . metoCLOPramide (REGLAN) tablet 5-10 mg  5-10 mg Oral Q8H PRN Swinteck, Aaron Edelman, MD       Or  . metoCLOPramide (REGLAN) injection 5-10 mg  5-10 mg Intravenous Q8H PRN Swinteck, Aaron Edelman, MD      . metoprolol succinate (TOPROL-XL) 24 hr tablet 50 mg  50 mg Oral Daily Radene Gunning, NP   50 mg at 03/25/17 0936  . ondansetron (ZOFRAN) tablet 4 mg  4 mg Oral Q6H PRN Swinteck, Aaron Edelman, MD       Or  . ondansetron (ZOFRAN) injection 4 mg  4 mg Intravenous Q6H PRN Rod Can, MD   4 mg at 03/22/17 1457  . polyethylene glycol (MIRALAX / GLYCOLAX) packet 17 g  17 g Oral Daily PRN Swinteck, Aaron Edelman, MD      . potassium chloride (K-DUR,KLOR-CON) CR tablet 30 mEq  30 mEq Oral Once Elodia Florence., MD      . Jordan Hawks [facility]) tablet 8.6 mg  1 tablet Oral BID Rod Can, MD   8.6 mg at 03/25/17 2947     Discharge Medications: Please see discharge summary for a list of discharge medications.  Relevant Imaging Results:  Relevant Lab Results:   Additional Information SS#:[address] [place], LCSW

## 2017-03-25 NOTE — Care Management Important Message (Signed)
Important Message  Patient Details  Name: Kelli Navarro MRN: [id number] Date of Birth: Nov 02, 1950   Medicare Important Message Given:  Yes    Shawntelle Ungar Montine Circle 03/25/2017, 11:46 AM

## 2017-03-25 NOTE — Progress Notes (Signed)
PROGRESS NOTE  Kelli Navarro  MWU:[id number] DOB: [birth year] DOA: 03/21/2017 PCP: Rubie Maid, MD  Brief Narrative: Kelli Navarro is a 66 y.o. female with a history of chronic back pain, seizure disorder, HTN, anxiety/depression, and remote treated RMSF who presented with left hip pain after a fall at home, found to have a displaced femoral neck fracture. She underwent left THA 9/16 by Dr. Lyla Glassing. She was also found to have right foot drop and fracture of 5th metatarsal. She and her wife report seizure-like activity several times per day for several months. The patient is essentially bed bound due to this.   Assessment & Plan: Principal Problem:   Displaced fracture of left femoral neck (HCC) Active Problems:   Adjustment disorder with disturbance of emotion   Fracture of 5th metatarsal   Hypokalemia   Foot drop   Seizure disorder (HCC)  Closed, displaced left femoral neck fracture: s/p anterior-approach left THA 9/16 by Dr. Lyla Glassing.  - Therapy evaluations ongoing. I have asked for CIR evaluation, awaiting insurance authorization. She is stable for discharge. Will otherwise require SNF, which is being arranged as back up option. - WBAT bilaterally with right post-op shoe and walker. - Pain control per orthopedics - ASA 81mg  po BID for DVT ppx per orthopedics - Reported worsening left hip pain 9/19. XR stable. Able to walk. Continue to observe.   Seizure disorder: Has been seen by neurology, Dr. [name] for complex partial seizures in the past, though seems to have undergone change of doctors per pt preference. Notes increased frequency of seizures, foot drop, and now resolved slurring of speech. CT negative.  - Will continue her only outpatient medication, clonazepam. - If seizure activity is witnessed, would need formal consult to neurology. - Discussed 9/18 with Dr. Hilma Favors who will attempt to arrange follow up with Neurology, Dr. Narda Amber.  Right foot drop: MRI of  lumbar spine unrevealing. RPR neg. - MRI brain without culprit lesion. Results discussed with neurology, Dr. Rory Percy, due to imaging findings consistent with hypertensive hemorrhages without history of sustained HTN. Specifically asked whether MS could be considered. He states the imaging findings are not at all characteristic of demyelinating disease.  - MRI C-spine unrevealing - Neurology recommends outpatient EMG, nerve conduction study.   Mild vitamin B12 deficiency: Not sure this could be etiology of neurological complaints.  - Start replacement 9/19.   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 Titers checked without evidence of active infection, discussed with ID, Dr. Linus Salmons. IgM is equivocal (negative in setting of no active infection) and IgG negative (indicating no prior infection.  - No further testing or treatment is needed  Chronic pain syndrome: Possibly opioid use disorder. Last MRI LS spine prior to admit from 02/03/2017 with mild multilevel lumbar disc and facet degeneration, no definite neural impingement. - Continue nucynta per home regimen in addition to medications acutely as above.   Nondisplaced left 5th metatarsal fracture:  - Post op shoe recommended per ortho  Anxiety/depression - Also contributing to all of above  DVT prophylaxis: Aspirin 81mg  BID Code Status: Full Family Communication: Wife at bedside Disposition Plan: Medically stable for discharge. Pending insurance authorization for CIR, SNF as back up.   Consultants:   Orthopedics, [place]  Neurology, [place] by phone 9/17 and 9/18  ID, Dr. Linus Salmons by phone 9/18  Dr. Rhea Pink by phone 9/17 and 9/18  Procedures:   Left anterior-approach THA 9/16  Antimicrobials:  None  Subjective: Had worsening left hip pain after MRI yesterday, but only in certain positions, tolerable. Foot drop remains. No seizure-like activity.    Objective: BP 109/65 (BP Location: Left Arm)   Pulse 96   Temp 99.4 F (37.4 C) (Oral)   Resp 18   Ht 5\' 7"  (1.702 m)   Wt 62.7 kg (138 lb 3.2 oz)   SpO2 98%   BMI 21.65 kg/m   Gen: 66 y.o. female in no distress. Pulm: Non-labored breathing room air. Clear to auscultation bilaterally.  CV: Regular rate and rhythm. No murmur, rub, or gallop. No JVD, no pedal edema. GI: Abdomen soft, non-tender, non-distended, with normoactive bowel sounds. No organomegaly or masses felt. Ext: Warm, no deformities. Left lateral incision c/d/i.  Skin: No rashes, lesions no ulcers Neuro: Alert and oriented. Right foot drop 3/5 strength, sensation intact, left diminished strength in leg throughout including foot dorsiflexion, though this is improved from yesterday to 4/5 (possibly effort-dependent). Reported paresthetic LLE sensation unchanged. Psych: Judgement and insight appear normal. Mood & affect appropriate.   Data Reviewed: I have personally reviewed following labs and imaging studies  CBC:  Recent Labs Lab 03/21/17 1108 03/22/17 0311 03/23/17 0221 03/24/17 0314 03/25/17 0547  WBC 5.0 5.7 8.7 6.1 4.1  NEUTROABS 4.0  --   --   --   --   HGB 12.8 12.5 9.3* 7.9* 8.1*  HCT 41.3 38.9 29.3* 25.1* 25.9*  MCV 87.7 87.2 86.2 86.9 86.9  PLT 221 216 190 196 834   Basic Metabolic Panel:  Recent Labs Lab 03/21/17 1118 03/21/17 2017 03/22/17 0311 03/23/17 0221 03/24/17 0314  NA 140  --  141 141 143  K 3.0*  --  3.7 3.8 3.5  CL 104  --  108 111 113*  CO2 24  --  28 25 27   GLUCOSE 97  --  96 107* 87  BUN 13  --  8 14 17   CREATININE 0.70  --  0.71 0.72 0.62  CALCIUM 8.6*  --  8.8* 8.3* 7.9*  MG  --  1.8  --   --   --    GFR: Estimated Creatinine Clearance: 67.3 mL/min (by C-G formula based on SCr of 0.62 mg/dL). Liver Function Tests:  Recent Labs Lab 03/21/17 1118 03/22/17 0311  AST 20 16  ALT 13* 11*  ALKPHOS 93 82  BILITOT 1.4* 1.4*  PROT 7.0 6.1*  ALBUMIN 3.4* 3.0*    Radiology Studies: Mr Brain 82 Contrast  Addendum Date: 03/25/2017   ADDENDUM REPORT: 03/25/2017 06:17 ADDENDUM: The hyperintense T2 weighted signal within the white matter is most commonly associated with chronic ischemic microangiopathy, rather than chronic hypertensive microangiopathy as stated in the original report. There are no microhemorrhages on this study. The pattern of white matter disease is not suggestive of demyelinating disease. Electronically Signed   By: Ulyses Jarred M.D.   On: 03/25/2017 06:17   Result Date: 03/25/2017 CLINICAL DATA:  Seizures.  Right lower extremity motor deficit. EXAM: MRI HEAD WITHOUT CONTRAST TECHNIQUE: Multiplanar, multiecho pulse sequences of the brain and surrounding structures were obtained without intravenous contrast. COMPARISON:  Head CT 01/01/2017 FINDINGS: Brain: The midline structures are normal. There is no focal diffusion restriction to indicate acute infarct. There is early confluent periventricular and deep white matter leukoaraiosis, most commonly seen in the setting of chronic hypertensive microangiopathy. No intraparenchymal hematoma or chronic microhemorrhage. Brain volume is normal for age without lobar predominant atrophy. The dura is normal and there is no  extra-axial collection. Vascular: Major intracranial arterial and venous sinus flow voids are preserved. Skull and upper cervical spine: The visualized skull base, calvarium, upper cervical spine and extracranial soft tissues are normal. Sinuses/Orbits: No fluid levels or advanced mucosal thickening. No mastoid or middle ear effusion. Normal orbits. IMPRESSION: 1. No acute intracranial abnormality. 2. Sequelae of chronic hypertensive microangiopathy. 3. No specific seizure etiology identified. Electronically Signed: By: Ulyses Jarred M.D. On: 03/24/2017 05:46   Mr Cervical Spine Wo Contrast  Result Date: 03/25/2017 CLINICAL DATA:  Seizures and lower extremity weakness EXAM: MRI CERVICAL  SPINE WITHOUT CONTRAST TECHNIQUE: Multiplanar, multisequence MR imaging of the cervical spine was performed. No intravenous contrast was administered. COMPARISON:  None. FINDINGS: Alignment: Normal. Vertebrae: No focal marrow lesion. No compression fracture or evidence of discitis osteomyelitis. Cord: Normal caliber and signal. Posterior Fossa, vertebral arteries, paraspinal tissues: Visualized posterior fossa is normal. Vertebral artery flow voids are preserved. Normal visualized paraspinal soft tissues. Disc levels: C1-C2: Normal. C2-C3: Normal disc space and facet joints. No spinal canal stenosis. No neuroforaminal stenosis. C3-C4: Right-greater-than-left facet hypertrophy. No disc bulge. No spinal canal stenosis. Mild right neuroforaminal stenosis. C4-C5: Moderate left facet hypertrophy. Normal disc. No spinal canal stenosis. Mild left neuroforaminal stenosis. C5-C6: Small left subarticular protrusion narrowing the ventral thecal space. No spinal canal stenosis. No neuroforaminal stenosis. C6-C7: Normal disc space and facet joints. No spinal canal stenosis. No neuroforaminal stenosis. C7-T1: Normal disc space and facet joints. No spinal canal stenosis. No neuroforaminal stenosis. The T1-T2 level is visualized on sagittal images only. There is no significant disc herniation, spinal canal stenosis or neuroforaminal narrowing. IMPRESSION: 1. No demyelinating lesions or other focal spinal cord abnormality. 2. No cord compression or spinal canal stenosis. 3. Mild multilevel facet predominant degenerative disease with no greater than mild foraminal stenosis. 4. Please see the addendum for the MRI brain of 03/24/2017 for further discussion of the white matter findings seen on that study. Electronically Signed   By: Ulyses Jarred M.D.   On: 03/25/2017 06:19   Dg Hip Unilat With Pelvis 2-3 Views Left  Result Date: 03/25/2017 CLINICAL DATA:  Sharp left hip pain after total hip replacement. EXAM: DG HIP (WITH OR  WITHOUT PELVIS) 2-3V LEFT COMPARISON:  Pelvic x-rays dated March 22, 2017. FINDINGS: Postsurgical changes related to left total hip arthroplasty. No evidence of hardware failure or loosening. Components remain well aligned. No periprosthetic fracture. Subcutaneous emphysema, likely postsurgical. IMPRESSION: Left total hip arthroplasty without evidence of hardware complication. Electronically Signed   By: Titus Dubin M.D.   On: 03/25/2017 15:58    LOS: 4 days   Time spent: 25 minutes.  Vance Gather, MD [facility] Pager [phone]  If 7PM-7AM, please contact night-coverage [email or web address] Password [facility] 03/25/2017, 4:49 PM

## 2017-03-25 NOTE — Clinical Social Work Note (Signed)
Clinical Social Work Assessment  Patient Details  Name: Kelli Navarro MRN: [id number] Date of Birth: 1950-09-03  Date of referral:  03/25/17               Reason for consult:  Facility Placement                Permission sought to share information with:  Facility Art therapist granted to share information::  Yes, Verbal Permission Granted  Name::     spouse, Investment banker, corporate::  SNF  Relationship::     Contact Information:     Housing/Transportation Living arrangements for the past 2 months:  Single Family Home Source of Information:  Spouse, Patient Patient Interpreter Needed:  None Criminal Activity/Legal Involvement Pertinent to Current Situation/Hospitalization:  No - Comment as needed Significant Relationships:  Spouse Lives with:  Spouse Do you feel safe going back to the place where you live?  No Need for family participation in patient care:  Yes (Comment)  Care giving concerns:  Pt from home with spouse. Pt independent prior to impairment. Pt not safe to return home. CIR considering patient.   Social Worker assessment / plan:  CSW met with patient and spouse at bedside and discussed clinical team's recommendation for rehab. CIR is considering patient, if patient denied, CSW will discuss SNF options as backup. CSW received permission to send out offers to local SNF's in area to see if they can offer bed. CSW discussed  insurance authorization process.   FL2 complete. Passr obtained. Offers to be sent.  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 No issues or concerns identified at this time time.  Patient/Family's Understanding of and Emotional Response to Diagnosis, Current Treatment, and Prognosis:   Patient/spouse has good understanding of diagnosis, current treatment and prognosis. Patient hopeful to improve and return to baseline. Pt wants a good SNF if she gets denied from CIR. CSW will assist with same and provide SNF list of offers. No other issues identified.  Emotional Assessment Appearance:  Appears stated age Attitude/Demeanor/Rapport:   (Cooperative) Affect (typically observed):    Orientation:  Oriented to Self, Oriented to Place, Oriented to  Time, Oriented to Situation Alcohol / Substance use:  Not Applicable Psych involvement (Current and /or in the community):  No (Comment)  Discharge Needs  Concerns to be addressed:  Care Coordination Readmission within the last 30 days:  No Current discharge risk:  Dependent with Mobility, Physical Impairment Barriers to Discharge:  No Barriers Identified   Kelli Baxter, LCSW 03/25/2017, 12:43 PM

## 2017-03-26 ENCOUNTER — Inpatient Hospital Stay (HOSPITAL_COMMUNITY): Payer: Medicare HMO

## 2017-03-26 MED ORDER — VITAMIN B-12 1000 MCG PO TABS
1000.0000 ug | ORAL_TABLET | Freq: Every day | ORAL | Status: DC
  Administered 2017-03-26 – 2017-03-27 (×2): 1000 ug via ORAL
  Filled 2017-03-26 (×2): qty 1

## 2017-03-26 MED ORDER — ARIPIPRAZOLE 5 MG PO TABS
10.0000 mg | ORAL_TABLET | Freq: Every day | ORAL | Status: DC
  Administered 2017-03-27: 10 mg via ORAL
  Filled 2017-03-26: qty 2

## 2017-03-26 MED ORDER — TRAZODONE HCL 50 MG PO TABS
50.0000 mg | ORAL_TABLET | Freq: Every day | ORAL | Status: DC
  Administered 2017-03-26: 50 mg via ORAL
  Filled 2017-03-26: qty 1

## 2017-03-26 NOTE — Progress Notes (Signed)
Went to Pt's room when she got back on the floor, asked the pt about the pills. Pt said she had no idea where it came from, she said her wife must have brought them and put them up here, she also said that she had stopped taking Nucynta a month ago.Educated pt as to why she can't take pills at the hospital. Searched room found 3 bottle of pills counted the pills in front of pt and took them to the pharmacy. Will cont. To monitor

## 2017-03-26 NOTE — Progress Notes (Signed)
RN walked into pt room to find her sitting on the floor. RN asked pt if she was ok and if she had fallen- pt replied " I went to the restroom and changed my clothes, but that is right before I had a seizure." RN asked pt if she remembered having a seizure or shaking- pt replied" I don't remember anything except changing my clothes and using the restroom." NP has been notified about this incident. Vitals have been taken and pt has been assessed. Awaiting on call back. Will continue to monitor

## 2017-03-26 NOTE — Social Work (Signed)
CSW met with patient to discuss that IP rehab was not approved by insurance.  CSW confirmed with patient SNF selection of [facility]. CSW discussed insurance process again.  CSW then called Juliann Pulse in admission at [facility] who will initiate Insurance Auth for placement.  CSW then called spouse Knute Neu to discuss. Spouse in agreement and appreciative of CSW phone call.  Elissa Hefty, LCSW Clinical Social Worker [phone]

## 2017-03-26 NOTE — Social Work (Signed)
CSW met with patient this morning. Pt selected [facility] for SNF placement.  CSW met with patient and advised that those SNF's did not make a bed offer.  CSW provided patient list yesterday of places that offered a bed.  CSW reviewed with patient and pt selected [facility] for placement, despite it being a little far from her home.  CSW then contacted Juliann Pulse in admissions and she advised that patient may have co-pay and/or out of network benefits. SNF will f/u and get back to CSW.  Elissa Hefty, LCSW Clinical Social Worker [phone]

## 2017-03-26 NOTE — Progress Notes (Signed)
Received call from X-ray patient had 5 Nucynta tucked behind her pillow. Will call MD and monitor pt when she gets up to floor.

## 2017-03-26 NOTE — Progress Notes (Signed)
PROGRESS NOTE  Kelli Navarro  NIO:[id number] DOB: 01-27-1951 DOA: 03/21/2017 PCP: Rubie Maid, MD  Brief Narrative: Kelli Navarro is a 66 y.o. female with a history of chronic back pain, seizure disorder, HTN, anxiety/depression, and remote treated RMSF who presented with left hip pain after a fall at home, found to have a displaced femoral neck fracture. She underwent left THA 9/16 by Dr. Lyla Glassing. She was also found to have right foot drop and fracture of 5th metatarsal. She and her wife report seizure-like activity several times per day for several months. The patient is essentially bed bound due to this.   Assessment & Plan: Principal Problem:   Displaced fracture of left femoral neck (HCC) Active Problems:   Adjustment disorder with disturbance of emotion   Fracture of 5th metatarsal   Hypokalemia   Foot drop   Seizure disorder (HCC)  Closed, displaced left femoral neck fracture: s/p anterior-approach left THA 9/16 by Dr. Lyla Glassing.  - Therapy evaluations ongoing. I have asked for CIR evaluation, awaiting insurance authorization. She is stable for discharge. Will otherwise require SNF, which is being arranged as back up option. - WBAT bilaterally with right post-op shoe and walker. - Pain control per orthopedics - ASA 81mg  po BID for DVT ppx per orthopedics - Reported worsening left hip pain 9/19. XR stable, CT hip 9/19 after being found down inexplicably was similarly stable. Able to walk. Continue to observe.   Seizure disorder: Has been seen by neurology, Dr. [name] for complex partial seizures in the past, though seems to have undergone change of doctors per pt preference. Notes increased frequency of seizures, foot drop, and now resolved slurring of speech. CT negative.  - Neurology consulted for possible seizures. Report of seizure activity does not strike me as typical, though she reports having these while inpatient.  - Will continue her only outpatient medication,  clonazepam. - Discussed with Dr. Hilma Favors who will attempt to arrange follow up with Neurology, Dr. Narda Amber.  Right foot drop: MRI of brain, cervical, and lumbar spine unrevealing. RPR neg. - Appreciate neurology recommendations.  - MRI brain without culprit lesion. Results discussed with neurology, Dr. Rory Percy, due to imaging findings consistent with hypertensive hemorrhages without history of sustained HTN. Specifically asked whether MS could be considered. He states the imaging findings are not at all characteristic of demyelinating disease.  - MRI C-spine unrevealing - Will need outpatient EMG, nerve conduction study.   Mild vitamin B12 deficiency: Not sure this could be etiology of neurological complaints.  - Started replacement 9/19.   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 Titers checked without evidence of active infection, discussed with ID, Dr. Linus Salmons. IgM is equivocal (negative in setting of no active infection) and IgG negative (indicating no prior infection.  - No further testing or treatment is needed  Chronic pain syndrome: Possibly opioid use disorder. Last MRI LS spine prior to admit from 02/03/2017 with mild multilevel lumbar disc and facet degeneration, no definite neural impingement. - Continue nucynta per home regimen in addition to medications acutely as above.   Nondisplaced left 5th metatarsal fracture:  - Post op shoe recommended per ortho  Anxiety/depression - Also contributing to all of above - Consider cymbalta  DVT prophylaxis: Aspirin 81mg  BID Code Status: Full Family Communication: Wife at bedside Disposition Plan: Pending insurance authorization for CIR, SNF as back up.   Consultants:   Orthopedics, [place]  Neurology, [place] by phone 9/17 and 9/18.  Formal consult 9/20.  ID, Dr. Linus Salmons by phone 9/18  Dr. Rhea Pink by phone 9/17, 9/18, 9/20  Procedures:   Left  anterior-approach THA 9/16  Antimicrobials:  None  Subjective: Episode last night noted, please review RN documentation. Pt reports absolutely no understanding of how she ended up in the bathroom up against the wall but had worsening left hip pain. No HA. She says this happens with her seizures. Not the grand-mal seizures, which she has sometimes, but the "smaller ones." When asked where the pills under her pillow came from, she has no explanation, speculating maybe with her ADHD she put them there and forgot. Not sure why she would put them there or where she got them. States she can't bear weight on left leg, though she has ambulated with PT.  Objective: BP 119/69 (BP Location: Left Arm)   Pulse 79   Temp 99.1 F (37.3 C) (Oral)   Resp 16   Ht 5\' 7"  (1.702 m)   Wt 62.7 kg (138 lb 3.2 oz)   SpO2 97%   BMI 21.65 kg/m   Gen: 66 y.o. female in no distress. Pulm: Non-labored breathing room air. Clear to auscultation bilaterally.  CV: Regular rate and rhythm. No murmur, rub, or gallop. No JVD, no pedal edema. GI: Abdomen soft, non-tender, non-distended, with normoactive bowel sounds. No organomegaly or masses felt. Ext: Warm, no deformities. Left lateral incision dressing c/d/i.  Skin: No rashes, lesions no ulcers Neuro: Alert, oriented. Diminished right foot strength with dorsiflexion, plantarflexion intact. Normal sensation with tenderness over lateral plantar surface. Left plantarflexion intact with 4/5 dorsiflexion. Reported paresthetic LLE sensation unchanged. Psych: Judgement and insight appear fair. Mood anxious and affect broad.   Data Reviewed: I have personally reviewed following labs and imaging studies  CBC:  Recent Labs Lab 03/21/17 1108 03/22/17 0311 03/23/17 0221 03/24/17 0314 03/25/17 0547  WBC 5.0 5.7 8.7 6.1 4.1  NEUTROABS 4.0  --   --   --   --   HGB 12.8 12.5 9.3* 7.9* 8.1*  HCT 41.3 38.9 29.3* 25.1* 25.9*  MCV 87.7 87.2 86.2 86.9 86.9  PLT 221 216 190 196  284   Basic Metabolic Panel:  Recent Labs Lab 03/21/17 1118 03/21/17 2017 03/22/17 0311 03/23/17 0221 03/24/17 0314  NA 140  --  141 141 143  K 3.0*  --  3.7 3.8 3.5  CL 104  --  108 111 113*  CO2 24  --  28 25 27   GLUCOSE 97  --  96 107* 87  BUN 13  --  8 14 17   CREATININE 0.70  --  0.71 0.72 0.62  CALCIUM 8.6*  --  8.8* 8.3* 7.9*  MG  --  1.8  --   --   --    GFR: Estimated Creatinine Clearance: 67.3 mL/min (by C-G formula based on SCr of 0.62 mg/dL). Liver Function Tests:  Recent Labs Lab 03/21/17 1118 03/22/17 0311  AST 20 16  ALT 13* 11*  ALKPHOS 93 82  BILITOT 1.4* 1.4*  PROT 7.0 6.1*  ALBUMIN 3.4* 3.0*   Radiology Studies: Ct Head Wo Contrast  Result Date: 03/26/2017 CLINICAL DATA:  Seizure and fell out of bed EXAM: CT HEAD WITHOUT CONTRAST CT CERVICAL SPINE WITHOUT CONTRAST TECHNIQUE: Multidetector CT imaging of the head and cervical spine was performed following the standard protocol without intravenous contrast. Multiplanar CT image reconstructions of the cervical spine were also generated. COMPARISON:  01/01/2017 FINDINGS: CT HEAD FINDINGS Brain: No  acute territorial infarction, hemorrhage, or intracranial mass. Mild atrophy. Mild small vessel ischemic changes of the white matter. Ventricle size within normal limits. Vascular: No hyperdense vessels. Scattered calcifications at the carotid siphons. Skull: No fracture or suspicious bone lesion Sinuses/Orbits: No acute abnormality Other: None CT CERVICAL SPINE FINDINGS Alignment: No subluxation.  Facet alignment is within normal limits. Skull base and vertebrae: No acute fracture. No primary bone lesion or focal pathologic process. Soft tissues and spinal canal: No prevertebral fluid or swelling. No visible canal hematoma. Disc levels:  Moderate degenerative changes at C5-C6 and C6-C7. Upper chest: Negative. Other: None IMPRESSION: 1. No CT evidence for acute intracranial abnormality. Atrophy and mild small vessel  ischemic changes of the white matter 2. Degenerative changes of the cervical spine. No acute fracture or malalignment. Electronically Signed   By: Donavan Foil M.D.   On: 03/26/2017 03:25   Ct Cervical Spine Wo Contrast  Result Date: 03/26/2017 CLINICAL DATA:  Seizure and fell out of bed EXAM: CT HEAD WITHOUT CONTRAST CT CERVICAL SPINE WITHOUT CONTRAST TECHNIQUE: Multidetector CT imaging of the head and cervical spine was performed following the standard protocol without intravenous contrast. Multiplanar CT image reconstructions of the cervical spine were also generated. COMPARISON:  01/01/2017 FINDINGS: CT HEAD FINDINGS Brain: No acute territorial infarction, hemorrhage, or intracranial mass. Mild atrophy. Mild small vessel ischemic changes of the white matter. Ventricle size within normal limits. Vascular: No hyperdense vessels. Scattered calcifications at the carotid siphons. Skull: No fracture or suspicious bone lesion Sinuses/Orbits: No acute abnormality Other: None CT CERVICAL SPINE FINDINGS Alignment: No subluxation.  Facet alignment is within normal limits. Skull base and vertebrae: No acute fracture. No primary bone lesion or focal pathologic process. Soft tissues and spinal canal: No prevertebral fluid or swelling. No visible canal hematoma. Disc levels:  Moderate degenerative changes at C5-C6 and C6-C7. Upper chest: Negative. Other: None IMPRESSION: 1. No CT evidence for acute intracranial abnormality. Atrophy and mild small vessel ischemic changes of the white matter 2. Degenerative changes of the cervical spine. No acute fracture or malalignment. Electronically Signed   By: Donavan Foil M.D.   On: 03/26/2017 03:25   Mr Cervical Spine Wo Contrast  Result Date: 03/25/2017 CLINICAL DATA:  Seizures and lower extremity weakness EXAM: MRI CERVICAL SPINE WITHOUT CONTRAST TECHNIQUE: Multiplanar, multisequence MR imaging of the cervical spine was performed. No intravenous contrast was administered.  COMPARISON:  None. FINDINGS: Alignment: Normal. Vertebrae: No focal marrow lesion. No compression fracture or evidence of discitis osteomyelitis. Cord: Normal caliber and signal. Posterior Fossa, vertebral arteries, paraspinal tissues: Visualized posterior fossa is normal. Vertebral artery flow voids are preserved. Normal visualized paraspinal soft tissues. Disc levels: C1-C2: Normal. C2-C3: Normal disc space and facet joints. No spinal canal stenosis. No neuroforaminal stenosis. C3-C4: Right-greater-than-left facet hypertrophy. No disc bulge. No spinal canal stenosis. Mild right neuroforaminal stenosis. C4-C5: Moderate left facet hypertrophy. Normal disc. No spinal canal stenosis. Mild left neuroforaminal stenosis. C5-C6: Small left subarticular protrusion narrowing the ventral thecal space. No spinal canal stenosis. No neuroforaminal stenosis. C6-C7: Normal disc space and facet joints. No spinal canal stenosis. No neuroforaminal stenosis. C7-T1: Normal disc space and facet joints. No spinal canal stenosis. No neuroforaminal stenosis. The T1-T2 level is visualized on sagittal images only. There is no significant disc herniation, spinal canal stenosis or neuroforaminal narrowing. IMPRESSION: 1. No demyelinating lesions or other focal spinal cord abnormality. 2. No cord compression or spinal canal stenosis. 3. Mild multilevel facet predominant degenerative disease with no  greater than mild foraminal stenosis. 4. Please see the addendum for the MRI brain of 03/24/2017 for further discussion of the white matter findings seen on that study. Electronically Signed   By: Ulyses Jarred M.D.   On: 03/25/2017 06:19   Dg Hip Unilat With Pelvis 2-3 Views Left  Result Date: 03/26/2017 CLINICAL DATA:  Left hip replacement, fall EXAM: DG HIP (WITH OR WITHOUT PELVIS) 2-3V LEFT COMPARISON:  03/25/2017, 09/15/ 2018 FINDINGS: Status post left hip replacement with normal alignment. No acute displaced fracture. Soft tissue gas over  the left hip and proximal thigh is felt secondary to recent surgical status. IMPRESSION: Status post left hip replacement. No acute osseous abnormality. Soft tissue gas felt consistent with recent surgery Electronically Signed   By: Donavan Foil M.D.   On: 03/26/2017 02:49   Dg Hip Unilat With Pelvis 2-3 Views Left  Result Date: 03/25/2017 CLINICAL DATA:  Sharp left hip pain after total hip replacement. EXAM: DG HIP (WITH OR WITHOUT PELVIS) 2-3V LEFT COMPARISON:  Pelvic x-rays dated March 22, 2017. FINDINGS: Postsurgical changes related to left total hip arthroplasty. No evidence of hardware failure or loosening. Components remain well aligned. No periprosthetic fracture. Subcutaneous emphysema, likely postsurgical. IMPRESSION: Left total hip arthroplasty without evidence of hardware complication. Electronically Signed   By: Titus Dubin M.D.   On: 03/25/2017 15:58    LOS: 5 days   Time spent: 25 minutes.  Vance Gather, MD [facility] Pager [phone]  If 7PM-7AM, please contact night-coverage [email or web address] Password [email or web address] 03/26/2017, 2:48 PM

## 2017-03-26 NOTE — Progress Notes (Signed)
Rehab admissions - Awaiting call back from insurance carrier regarding potential inpatient rehab admission.  Call me for questions.  #[phone]

## 2017-03-26 NOTE — Consult Note (Signed)
NEURO HOSPITALIST CONSULT NOTE   Requestig physician: Dr. Bonner Puna   Reason for Consult: right foot drop   History obtained from:  Patient   HPI:                                                                                                                                          Kelli Navarro is an 66 y.o. female with medical history significant for chronic back pain, hypertension, anxiety, [place] spotted fever, depression, seizure disorder since emergency Department chief complaint of hip pain after fall. Initial evaluation reveals right hip fracture., I am possible base of the right fifth metatarsal fracture. Patient stated that" I had a seizure" . Patient does have a seizure history however she has never had a EEG that confirmed seizure nor she ever had a long-term monitoring to confirm seizure. She has been seen and followed by a neurologist at [place] however she currently is in the interim of changing her neurologist as she felt that her current neurologist Drawn so many medications she was extremely tired all the time. She is also involved with  chronic pain management.  At [facility] patient did go under a left total hip arthroplasty, anterior approach with closed treatment of right fifth metatarsal fracture without manipulation. Looking through the operation noted no time was patient placed in stirrups and all bony prominences were fully padded and made sure that there was no compression site.  3 days postoperatively patient was complaining of right foot drop. Most notably that she was able to plantarflex her foot but unable to dorsiflex her foot she also has strength with inversion but not eversion. This can often be seen with compression of the peroneal nerve however at no point time during the surgery was or any pressure or prolonged manipulation of the peroneal nerve or lateral aspect of the knee. Patient is also complaining that she has very  decreased sensation on her left leg. Patient does have a B12 deficiency which she knows of and states that she does not eat well.  Patient has been on multiple antiepileptic medications in the past and states that the side effects are either that she will stops breathing, becomes drowsy, or has a rash.  Past Medical History:  Diagnosis Date  . ADHD   . Chronic back pain   . Depression   . Hypertension   . Right femoral fracture ([place])   . [facility] spotted fever   . Seizures ([place])     Past Surgical History:  Procedure Laterality Date  . none    . TOTAL HIP ARTHROPLASTY Left 03/22/2017   Procedure: TOTAL HIP ARTHROPLASTY ANTERIOR APPROACH;  Surgeon: Rod Can, MD;  Location: [facility];  Service: Orthopedics;  Laterality: Left;  Family History  Problem Relation Age of Onset  . Multiple sclerosis Mother   . Heart Problems Mother        by-pass  . Heart Problems Sister        pacemaker    Social History:  reports that she has never smoked. She has never used smokeless tobacco. She reports that she does not drink alcohol or use drugs.  Allergies  Allergen Reactions  . Aptiom [Eslicarbazepine]   . Bupropion Other (See Comments)     Caused depression  . Dilantin [Phenytoin Sodium Extended]   . Diphenhydramine Other (See Comments)    Feels like "spiders are crawling" on her  . Divalproex Sodium   . Keppra [Levetiracetam]   . Lamictal [Lamotrigine]   . Lorazepam Swelling    Hallucinations  . Lyrica [Pregabalin] Other (See Comments)    Pt reports feeling loopy and confused  . Morphine And Related     At high doses  . Other     Allergy to steroids causing anaphylaxis    . Prednisone     MEDICATIONS:                                                                                                                     Prior to Admission:  Prescriptions Prior to Admission  Medication Sig Dispense Refill Last Dose  . amphetamine-dextroamphetamine (ADDERALL) 20 MG  tablet Take 20 mg by mouth 3 (three) times daily.   03/20/2017 at Unknown time  . aspirin 81 MG tablet Take 81 mg by mouth daily.   03/20/2017 at Unknown time  . clonazePAM (KLONOPIN) 1 MG tablet Take 1 mg by mouth 4 (four) times daily.   03/21/2017 at Unknown time  . metoprolol succinate (TOPROL-XL) 50 MG 24 hr tablet Take 50 mg by mouth daily. Take with or immediately following a meal.   03/20/2017 at unk  . NUCYNTA 50 MG tablet Take 50 mg by mouth QID. Taken with meals  0 03/20/2017 at Unknown time  . albuterol (PROVENTIL HFA;VENTOLIN HFA) 108 (90 BASE) MCG/ACT inhaler Inhale 2 puffs into the lungs every 6 (six) hours as needed for wheezing.   PRN   Scheduled: . amphetamine-dextroamphetamine  10 mg Oral Daily  . aspirin EC  81 mg Oral BID WC  . clonazePAM  1 mg Oral QID  . docusate sodium  100 mg Oral BID  . feeding supplement (ENSURE ENLIVE)  237 mL Oral BID BM  . metoprolol succinate  50 mg Oral Daily  . potassium chloride  30 mEq Oral Once  . senna  1 tablet Oral BID  . vitamin B-12  1,000 mcg Oral Daily     ROS:  History obtained from the patient  General ROS: negative for - chills, fatigue, fever, night sweats, weight gain or weight loss Psychological ROS: negative for - behavioral disorder, hallucinations, memory difficulties, mood swings or suicidal ideation Ophthalmic ROS: negative for - blurry vision, double vision, eye pain or loss of vision ENT ROS: negative for - epistaxis, nasal discharge, oral lesions, sore throat, tinnitus or vertigo Allergy and Immunology ROS: negative for - hives or itchy/watery eyes Hematological and Lymphatic ROS: negative for - bleeding problems, bruising or swollen lymph nodes Endocrine ROS: negative for - galactorrhea, hair pattern changes, polydipsia/polyuria or temperature intolerance Respiratory ROS: negative for -  cough, hemoptysis, shortness of breath or wheezing Cardiovascular ROS: negative for - chest pain, dyspnea on exertion, edema or irregular heartbeat Gastrointestinal ROS: negative for - abdominal pain, diarrhea, hematemesis, nausea/vomiting or stool incontinence Genito-Urinary ROS: negative for - dysuria, hematuria, incontinence or urinary frequency/urgency Musculoskeletal ROS: negative for - joint swelling or muscular weakness Neurological ROS: as noted in HPI Dermatological ROS: negative for rash and skin lesion changes   Blood pressure 119/69, pulse 79, temperature 99.1 F (37.3 C), temperature source Oral, resp. rate 16, height 5\' 7"  (1.702 m), weight 62.7 kg (138 lb 3.2 oz), SpO2 97 %.   Neurologic Examination:                                                                                                      HEENT-  Normocephalic, no lesions, without obvious abnormality.  Normal external eye and conjunctiva.  Normal TM's bilaterally.  Normal auditory canals and external ears. Normal external nose, mucus membranes and septum.  Normal pharynx. Cardiovascular- S1, S2 normal, pulses palpable throughout   Lungs- chest clear, no wheezing, rales, normal symmetric air entry Abdomen- normal findings: bowel sounds normal Extremities- no edema Lymph-no adenopathy palpable Musculoskeletal-no joint tenderness, deformity or swelling Skin-warm and dry, no hyperpigmentation, vitiligo, or suspicious lesions  Neurological Examination Mental Status: Alert, oriented, thought content appropriate.  Speech fluent without evidence of aphasia.  Able to follow 3 step commands without difficulty. Cranial Nerves: II: ; Visual fields grossly normal,  III,IV, VI: ptosis not present, extra-ocular motions intact bilaterally pupils equal, round, reactive to light and accommodation V,VII: smile symmetric, facial light touch sensation normal bilaterally VIII: hearing normal bilaterally IX,X: uvula rises  symmetrically XI: bilateral shoulder shrug XII: midline tongue extension Motor: Right : Upper extremity   5/5    Left:     Upper extremity   5/5  --Right hip flexion is 5/5, right knee flexion and extension is 5/5, patient's right dorsiflexion is 5/5, inversion of ankle is 4/5, eversion of ankle is 1/5, dorsiflexion of the right ankle shows poor effort at times she is able to dorsiflex antigravity and extend her great toe however when formally examining and not distracting patient patient states that she cannot do this.   --Left hip flexion was not tested secondary to recent surgery. Left knee flexion and extension were 5/5, plantar flexion 5/5, dorsiflexion 3/5 with good inversion and eversion of her ankle again with poor effort at her  ankle. Tone and bulk:normal tone throughout; no atrophy noted Sensory: Patient showed decreased vibration and temperature from her foot to mid calf. Proprioception was grossly intact and pain was grossly intact Deep Tendon Reflexes: 2+ and symmetric throughout upper extremities and bilateral knee jerk. Bilateral ankle jerks were absent. Plantars: Right: Mute   Left: downgoing Cerebellar: normal finger-to-nose,  Gait: Not tested      Lab Results: Basic Metabolic Panel:  Recent Labs Lab 03/21/17 1118 03/21/17 2017 03/22/17 0311 03/23/17 0221 03/24/17 0314  NA 140  --  141 141 143  K 3.0*  --  3.7 3.8 3.5  CL 104  --  108 111 113*  CO2 24  --  28 25 27   GLUCOSE 97  --  96 107* 87  BUN 13  --  8 14 17   CREATININE 0.70  --  0.71 0.72 0.62  CALCIUM 8.6*  --  8.8* 8.3* 7.9*  MG  --  1.8  --   --   --     Liver Function Tests:  Recent Labs Lab 03/21/17 1118 03/22/17 0311  AST 20 16  ALT 13* 11*  ALKPHOS 93 82  BILITOT 1.4* 1.4*  PROT 7.0 6.1*  ALBUMIN 3.4* 3.0*   No results for input(s): LIPASE, AMYLASE in the last 168 hours. No results for input(s): AMMONIA in the last 168 hours.  CBC:  Recent Labs Lab 03/21/17 1108 03/22/17 0311  03/23/17 0221 03/24/17 0314 03/25/17 0547  WBC 5.0 5.7 8.7 6.1 4.1  NEUTROABS 4.0  --   --   --   --   HGB 12.8 12.5 9.3* 7.9* 8.1*  HCT 41.3 38.9 29.3* 25.1* 25.9*  MCV 87.7 87.2 86.2 86.9 86.9  PLT 221 216 190 196 218    Cardiac Enzymes:  Recent Labs Lab 03/21/17 1118  CKTOTAL 60    Microbiology: Results for orders placed or performed during the hospital encounter of 03/21/17  MRSA PCR Screening     Status: None   Collection Time: 03/21/17  6:49 PM  Result Value Ref Range Status   MRSA by PCR NEGATIVE NEGATIVE Final    Comment:        The GeneXpert MRSA Assay (FDA approved for NASAL specimens only), is one component of a comprehensive MRSA colonization surveillance program. It is not intended to diagnose MRSA infection nor to guide or monitor treatment for MRSA infections.     Imaging: Ct Head Wo Contrast  Result Date: 03/26/2017 CLINICAL DATA:  Seizure and fell out of bed EXAM: CT HEAD WITHOUT CONTRAST CT CERVICAL SPINE WITHOUT CONTRAST TECHNIQUE: Multidetector CT imaging of the head and cervical spine was performed following the standard protocol without intravenous contrast. Multiplanar CT image reconstructions of the cervical spine were also generated. COMPARISON:  01/01/2017 FINDINGS: CT HEAD FINDINGS Brain: No acute territorial infarction, hemorrhage, or intracranial mass. Mild atrophy. Mild small vessel ischemic changes of the white matter. Ventricle size within normal limits. Vascular: No hyperdense vessels. Scattered calcifications at the carotid siphons. Skull: No fracture or suspicious bone lesion Sinuses/Orbits: No acute abnormality Other: None CT CERVICAL SPINE FINDINGS Alignment: No subluxation.  Facet alignment is within normal limits. Skull base and vertebrae: No acute fracture. No primary bone lesion or focal pathologic process. Soft tissues and spinal canal: No prevertebral fluid or swelling. No visible canal hematoma. Disc levels:  Moderate degenerative  changes at C5-C6 and C6-C7. Upper chest: Negative. Other: None IMPRESSION: 1. No CT evidence for acute intracranial abnormality. Atrophy and mild  small vessel ischemic changes of the white matter 2. Degenerative changes of the cervical spine. No acute fracture or malalignment. Electronically Signed   By: Donavan Foil M.D.   On: 03/26/2017 03:25   Ct Cervical Spine Wo Contrast  Result Date: 03/26/2017 CLINICAL DATA:  Seizure and fell out of bed EXAM: CT HEAD WITHOUT CONTRAST CT CERVICAL SPINE WITHOUT CONTRAST TECHNIQUE: Multidetector CT imaging of the head and cervical spine was performed following the standard protocol without intravenous contrast. Multiplanar CT image reconstructions of the cervical spine were also generated. COMPARISON:  01/01/2017 FINDINGS: CT HEAD FINDINGS Brain: No acute territorial infarction, hemorrhage, or intracranial mass. Mild atrophy. Mild small vessel ischemic changes of the white matter. Ventricle size within normal limits. Vascular: No hyperdense vessels. Scattered calcifications at the carotid siphons. Skull: No fracture or suspicious bone lesion Sinuses/Orbits: No acute abnormality Other: None CT CERVICAL SPINE FINDINGS Alignment: No subluxation.  Facet alignment is within normal limits. Skull base and vertebrae: No acute fracture. No primary bone lesion or focal pathologic process. Soft tissues and spinal canal: No prevertebral fluid or swelling. No visible canal hematoma. Disc levels:  Moderate degenerative changes at C5-C6 and C6-C7. Upper chest: Negative. Other: None IMPRESSION: 1. No CT evidence for acute intracranial abnormality. Atrophy and mild small vessel ischemic changes of the white matter 2. Degenerative changes of the cervical spine. No acute fracture or malalignment. Electronically Signed   By: Donavan Foil M.D.   On: 03/26/2017 03:25   Mr Cervical Spine Wo Contrast  Result Date: 03/25/2017 CLINICAL DATA:  Seizures and lower extremity weakness EXAM: MRI  CERVICAL SPINE WITHOUT CONTRAST TECHNIQUE: Multiplanar, multisequence MR imaging of the cervical spine was performed. No intravenous contrast was administered. COMPARISON:  None. FINDINGS: Alignment: Normal. Vertebrae: No focal marrow lesion. No compression fracture or evidence of discitis osteomyelitis. Cord: Normal caliber and signal. Posterior Fossa, vertebral arteries, paraspinal tissues: Visualized posterior fossa is normal. Vertebral artery flow voids are preserved. Normal visualized paraspinal soft tissues. Disc levels: C1-C2: Normal. C2-C3: Normal disc space and facet joints. No spinal canal stenosis. No neuroforaminal stenosis. C3-C4: Right-greater-than-left facet hypertrophy. No disc bulge. No spinal canal stenosis. Mild right neuroforaminal stenosis. C4-C5: Moderate left facet hypertrophy. Normal disc. No spinal canal stenosis. Mild left neuroforaminal stenosis. C5-C6: Small left subarticular protrusion narrowing the ventral thecal space. No spinal canal stenosis. No neuroforaminal stenosis. C6-C7: Normal disc space and facet joints. No spinal canal stenosis. No neuroforaminal stenosis. C7-T1: Normal disc space and facet joints. No spinal canal stenosis. No neuroforaminal stenosis. The T1-T2 level is visualized on sagittal images only. There is no significant disc herniation, spinal canal stenosis or neuroforaminal narrowing. IMPRESSION: 1. No demyelinating lesions or other focal spinal cord abnormality. 2. No cord compression or spinal canal stenosis. 3. Mild multilevel facet predominant degenerative disease with no greater than mild foraminal stenosis. 4. Please see the addendum for the MRI brain of 03/24/2017 for further discussion of the white matter findings seen on that study. Electronically Signed   By: Ulyses Jarred M.D.   On: 03/25/2017 06:19   Dg Hip Unilat With Pelvis 2-3 Views Left  Result Date: 03/26/2017 CLINICAL DATA:  Left hip replacement, fall EXAM: DG HIP (WITH OR WITHOUT PELVIS)  2-3V LEFT COMPARISON:  03/25/2017, 09/15/ 2018 FINDINGS: Status post left hip replacement with normal alignment. No acute displaced fracture. Soft tissue gas over the left hip and proximal thigh is felt secondary to recent surgical status. IMPRESSION: Status post left hip replacement. No acute osseous  abnormality. Soft tissue gas felt consistent with recent surgery Electronically Signed   By: Donavan Foil M.D.   On: 03/26/2017 02:49   Dg Hip Unilat With Pelvis 2-3 Views Left  Result Date: 03/25/2017 CLINICAL DATA:  Sharp left hip pain after total hip replacement. EXAM: DG HIP (WITH OR WITHOUT PELVIS) 2-3V LEFT COMPARISON:  Pelvic x-rays dated March 22, 2017. FINDINGS: Postsurgical changes related to left total hip arthroplasty. No evidence of hardware failure or loosening. Components remain well aligned. No periprosthetic fracture. Subcutaneous emphysema, likely postsurgical. IMPRESSION: Left total hip arthroplasty without evidence of hardware complication. Electronically Signed   By: Titus Dubin M.D.   On: 03/25/2017 15:58    Assessment and plan per attending neurologist  Etta Quill PA-C [facility] [phone]  03/26/2017, 2:57 PM Attending addendum Patient seen and examined. Agree with the history and physical as documented above.  Assessment/Plan: 66 year old woman with a past medical history of seizures, was allergic to multiple medications and currently only on Klonopin for seizures, who has been admitted for a hip fracture status post hip arthroplasty, presents with bilateral foot drop right greater than left. Her exam was quite effort dependent and had no other issues other than the possible foot drop. I have reviewed the images. Brain MRI scan does not show any acute changes. Cervical spine MRI also does not show any acute changes. L-spine MRI does not show any changes that can explain bilateral foot drops. On her exam she does have peripheral neuropathy as  documented by absence of ankle jerks and stocking type pattern of sensory loss. This I think is most likely attributable to her low vitamin B12 levels.  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 Peripheral neuropathy  Recommendations I do recommend obtaining a nerve conduction study in the coming weeks as an outpatient to evaluate for peroneal neuropathy. Aggressive vitamin B12 replenishment. Check hemoglobin A1c as well as TSH and correct if necessary. Continue her outpatient antiepileptic regimen as is. Outpatient neurology follow-up in 4-6 weeks.  -- Please call with questions  Amie Portland, MD [facility] [phone]  If 7pm to 7am, please call on call as listed on AMION.

## 2017-03-26 NOTE — Progress Notes (Signed)
PT Cancellation Note  Patient Details Name: Kelli Navarro MRN: [id number] DOB: 03-04-1951   Cancelled Treatment:    Reason Eval/Treat Not Completed: Pain limiting ability to participate. Pt reports she just received pain meds and would like PT to come back later because her pain is too much since her seizure/fall last night. Will follow-up for PT treatment as time allows.  Mabeline Caras, PT, DPT Acute Rehab Services  Pager: [place] 03/26/2017, 9:59 AM

## 2017-03-26 NOTE — Progress Notes (Signed)
Physical Therapy Treatment Patient Details Name: Kelli Navarro MRN: [id number] DOB: Nov 22, 1950 Today's Date: 03/26/2017    History of Present Illness Pt is a 66 y.o. female admitted with seizure and fall with left femur fx and right 5th metatarsal fx s/p left THA (direct anterior approach). PMHx: chronic back pain, HTN, anxiety, [place] spotted fever, depression, seizure disorder.    PT Comments    Pt slowly progressing with mobility. Continues to c/o of LLE "numbness on the inside" with L hip flexion 2/5, knee flex/ext 3/5, and ankle PF/DF 2/5. Able to perform seated therex, and repeated sit-to-stands with RW and min guard for balance, but declining amb secondary to increased fear of falling "since my seizure and fall last night." Education on fall risk reduction and technique with assist from PT, but pt still declining. Very motivated to participate with therapy other than this. Will continue to follow acutely.   Follow Up Recommendations  CIR     Equipment Recommendations  Rolling walker with 5" wheels    Recommendations for Other Services OT consult     Precautions / Restrictions Precautions Precautions: Fall Required Braces or Orthoses: Other Brace/Splint Other Brace/Splint: R foot post op shoe Restrictions Weight Bearing Restrictions: Yes RLE Weight Bearing: Weight bearing as tolerated LLE Weight Bearing: Weight bearing as tolerated    Mobility  Bed Mobility Overal bed mobility: Needs Assistance Bed Mobility: Supine to Sit     Supine to sit: Min assist Sit to supine: Min guard   General bed mobility comments: MinA to assist LLE out of bed; pt able to use theraband to assist LLE back into bed  Transfers Overall transfer level: Needs assistance Equipment used: Rolling walker (2 wheeled) Transfers: Sit to/from Stand Sit to Stand: Min guard         General transfer comment: Sit-to-stands x10 with RW and min guard for balance; great technique with RW,  requires no physical assist  Ambulation/Gait             General Gait Details: Pt declines amb secondary to reports of "numbness on the inside" throughout LLE and is afraid of falling. Max encouragement on safety and technique with assist from PT, but pt still declines   Stairs            Wheelchair Mobility    Modified Rankin (Stroke Patients Only)       Balance Overall balance assessment: Needs assistance Sitting-balance support: Feet supported;No upper extremity supported Sitting balance-Leahy Scale: Good       Standing balance-Leahy Scale: Poor Standing balance comment: reliant on RW for safe amb/standing                            Cognition Arousal/Alertness: Awake/alert Behavior During Therapy: WFL for tasks assessed/performed Overall Cognitive Status: Within Functional Limits for tasks assessed                                 General Comments: Pt anxious regarding fear of falling, reporting she feel OOB after a seizure last night. Unable to reason with pt regarding the importance of OOB mobility and amb, despite max encouragement and education      Exercises Total Joint Exercises Ankle Circles/Pumps: AROM;Both;10 reps;Supine Long Arc Quad: AROM;Both;10 reps;Standing Marching in Standing: AROM;Right;AAROM;Left;10 reps;Seated    General Comments        Pertinent Vitals/Pain Pain Assessment: 0-10 Pain  Score: 4  Pain Location: BLEs "all over" Pain Descriptors / Indicators: Guarding;Grimacing;Discomfort Pain Intervention(s): Monitored during session;Premedicated before session;Ice applied    Home Living                      Prior Function            PT Goals (current goals can now be found in the care plan section) Acute Rehab PT Goals Patient Stated Goal: return home PT Goal Formulation: With patient/family Time For Goal Achievement: 04/06/17 Potential to Achieve Goals: Good Progress towards PT goals:  Progressing toward goals    Frequency    7X/week      PT Plan Current plan remains appropriate    Co-evaluation              AM-PAC PT "6 Clicks" Daily Activity  Outcome Measure  Difficulty turning over in bed (including adjusting bedclothes, sheets and blankets)?: A Little Difficulty moving from lying on back to sitting on the side of the bed? : Unable Difficulty sitting down on and standing up from a chair with arms (e.g., wheelchair, bedside commode, etc,.)?: A Little Help needed moving to and from a bed to chair (including a wheelchair)?: A Little Help needed walking in hospital room?: A Little Help needed climbing 3-5 steps with a railing? : A Lot 6 Click Score: 15    End of Session Equipment Utilized During Treatment: Gait belt Activity Tolerance: Other (comment);Patient tolerated treatment well (unwilling to amb) Patient left: with call bell/phone within reach;in bed Nurse Communication: Mobility status PT Visit Diagnosis: Other abnormalities of gait and mobility (R26.89);Difficulty in walking, not elsewhere classified (R26.2);Repeated falls (R29.6) Pain - Right/Left: Left Pain - part of body: Hip     Time: 3734-2876 PT Time Calculation (min) (ACUTE ONLY): 19 min  Charges:  $Therapeutic Exercise: 8-22 mins                    G Codes:      Mabeline Caras, PT, DPT Acute Rehab Services  Pager: [place] 03/26/2017, 11:27 AM

## 2017-03-26 NOTE — Progress Notes (Signed)
NP called back is going to get a CT of head and a X-ray of hip.

## 2017-03-27 DIAGNOSIS — Z96642 Presence of left artificial hip joint: Secondary | ICD-10-CM

## 2017-03-27 LAB — TSH: TSH: 1.055 u[IU]/mL (ref 0.350–4.500)

## 2017-03-27 LAB — HEMOGLOBIN A1C
HEMOGLOBIN A1C: 4.8 % (ref 4.8–5.6)
MEAN PLASMA GLUCOSE: 91.06 mg/dL

## 2017-03-27 MED ORDER — HYDROCODONE-ACETAMINOPHEN 5-325 MG PO TABS: 1.0000 | ORAL_TABLET | Freq: Four times a day (QID) | ORAL | 0 refills | Status: DC | PRN

## 2017-03-27 MED ORDER — CLONAZEPAM 1 MG PO TABS: 1.0000 mg | ORAL_TABLET | Freq: Four times a day (QID) | ORAL | 0 refills | Status: DC

## 2017-03-27 MED ORDER — ONDANSETRON HCL 4 MG PO TABS: 4.0000 mg | ORAL_TABLET | Freq: Four times a day (QID) | ORAL | 0 refills | Status: DC | PRN

## 2017-03-27 MED ORDER — TRAZODONE HCL 50 MG PO TABS: 50.0000 mg | ORAL_TABLET | Freq: Every day | ORAL | Status: DC

## 2017-03-27 MED ORDER — ARIPIPRAZOLE 10 MG PO TABS: 10.0000 mg | ORAL_TABLET | Freq: Every day | ORAL | Status: DC

## 2017-03-27 MED ORDER — SENNA 8.6 MG PO TABS: 1.0000 | ORAL_TABLET | Freq: Two times a day (BID) | ORAL | 0 refills | Status: DC

## 2017-03-27 MED ORDER — AMPHETAMINE-DEXTROAMPHETAMINE 20 MG PO TABS: 20.0000 mg | ORAL_TABLET | Freq: Two times a day (BID) | ORAL | 0 refills | Status: DC

## 2017-03-27 MED ORDER — NUCYNTA 50 MG PO TABS: 50.0000 mg | ORAL_TABLET | Freq: Four times a day (QID) | ORAL | 0 refills | Status: DC

## 2017-03-27 MED ORDER — CYANOCOBALAMIN 2000 MCG PO TABS: 2000.0000 ug | ORAL_TABLET | Freq: Every day | ORAL | Status: DC

## 2017-03-27 MED ORDER — POLYETHYLENE GLYCOL 3350 17 G PO PACK: 17.0000 g | PACK | Freq: Every day | ORAL | 0 refills | Status: DC | PRN

## 2017-03-27 MED ORDER — ASPIRIN 81 MG PO TABS
81.0000 mg | ORAL_TABLET | Freq: Two times a day (BID) | ORAL | Status: DC
Start: 2017-03-27 — End: 2020-08-17

## 2017-03-27 NOTE — Clinical Social Work Placement (Signed)
   CLINICAL SOCIAL WORK PLACEMENT  NOTE  Date:  03/27/2017  Patient Details  Name: Jan Walters MRN: [id number] Date of Birth: 10-12-1950  Clinical Social Work is seeking post-discharge placement for this patient at the [place] level of care (*CSW will initial, date and re-position this form in  chart as items are completed):  Yes   Patient/family provided with [facility]'s list of facilities offering this level of care within the geographic area requested by the patient (or if unable, by the patient's family).  Yes   Patient/family informed of their freedom to choose among providers that offer the needed level of care, that participate in Medicare, Medicaid or managed care program needed by the patient, have an available bed and are willing to accept the patient.  Yes   Patient/family informed of [facility]'s ownership interest in [facility] and [facility], as well as of the fact that they are under no obligation to receive care at these facilities.  PASRR submitted to EDS on       PASRR number received on 03/25/17     Existing PASRR number confirmed on       FL2 transmitted to all facilities in geographic area requested by pt/family on 03/25/17     FL2 transmitted to all facilities within larger geographic area on       Patient informed that his/her managed care company has contracts with or will negotiate with certain facilities, including the following:        Yes   Patient/family informed of bed offers received.  Patient chooses bed at [facility]     Physician recommends and patient chooses bed at      Patient to be transferred to [facility] on  .  Patient to be transferred to facility by [facility]     Patient family notified on   of transfer.  Name of family member notified:  spouse Judee     PHYSICIAN Please sign FL2     Additional Comment:     _______________________________________________ Jorge Ny, LCSW 03/27/2017, 12:12 PM

## 2017-03-27 NOTE — Progress Notes (Signed)
Patient will discharge to [place] Anticipated discharge date: 9/21 Family notified: pt wife Transportation by wife  CSW signing off.  Jorge Ny, LCSW Clinical Social Worker [phone]

## 2017-03-27 NOTE — Progress Notes (Signed)
PT Cancellation Note  Patient Details Name: Kelli Navarro MRN: [id number] DOB: 06/10/1951   Cancelled Treatment:    Reason Eval/Treat Not Completed: (P) Other (comment) (Pt to transfer to SNF today will defer PT needs to next level of care.  )   [name] Eli Hose 03/27/2017, 2:07 PM  Governor R[place], PTA pager [phone]

## 2017-03-27 NOTE — Progress Notes (Signed)
Three home meds returned to pt from pharmacy prior to discharge.   Kelli Navarro, Kelli Navarro

## 2017-03-27 NOTE — Progress Notes (Signed)
Pt ready for d/c to SNF today per MD. Report called to Whitney at [facility], all questions answered. Reviewd AVS with pt and her wife and prescriptions given to them to take to facility. Pt chose to be transported to facility by personal vehicle with wife. Pt will be assisted to car by nursing staff.   [place], Jerry Caras

## 2017-03-27 NOTE — Discharge Summary (Addendum)
Physician Discharge Summary  Yzabella Crunk YKZ:[id number] DOB: July 04, 1951 DOA: 03/21/2017  PCP: Rubie Maid, MD  Admit date: 03/21/2017 Discharge date: 03/27/2017  Admitted From: Home Disposition: SNF   Recommendations for Outpatient Follow-up:  1. Follow up with PCP in 1-2 weeks 2. Follow up with psychiatry as soon as possible.  3. Consider EMG/nerve conduction study as outpatient for foot drop 4. Follow up with orthopedics 5. Continue B12 supplementation, consider recheck.   Home Health: N/A Equipment/Devices: Per SNF Discharge Condition: Stable CODE STATUS: Full Diet recommendation: Regular  Brief/Interim Summary: Rhyder Bratz is a 66 y.o. female with a history of chronic back pain, seizure disorder, HTN, anxiety/depression, and remote treated RMSF who presented with left hip pain after a fall at home, found to have a displaced femoral neck fracture. She underwent left THA 9/16 by Dr. Lyla Glassing. She was also found to have right foot drop and fracture of 5th metatarsal. She and her wife report seizure-like activity several times per day for several months. The patient is essentially bed bound due to this.   Discharge Diagnoses:  Principal Problem:   Displaced fracture of left femoral neck (HCC) Active Problems:   Adjustment disorder with disturbance of emotion   Fracture of 5th metatarsal   Hypokalemia   Foot drop   Seizure disorder (HCC)  Closed, displaced left femoral neck fracture: s/p anterior-approach left THA 9/16 by Dr. Lyla Glassing.  - WBAT bilaterally with right post-op shoe and walker. - Pain control per orthopedics: hydrocodone - ASA 81mg  po BID for DVT ppx per orthopedics - Reported worsening left hip pain 9/19, since stable. XR stable. CT hip 9/19 after being found down inexplicably was similarly stable. Able to walk. Continue to observe.   Seizure disorder: Has been seen by neurology, Dr. [name] for complex partial seizures in the past, though seems to  have undergone change of doctors per pt preference. Notes increased frequency of seizures, foot drop, and now resolved slurring of speech. CT negative.  - Will continue her only outpatient medication, clonazepam. - If seizure activity is witnessed, would need formal consult to neurology. - Discussed 9/18 with Dr. Hilma Favors who will arrange follow up with Neurology, Dr. Narda Amber.  Right foot drop: MRI of brain, cervical, and lumbar spine unrevealing. RPR neg. B12 low. - MRI brain without culprit lesion. Results discussed with neurology, Dr. Rory Percy, due to imaging findings consistent with hypertensive hemorrhages without history of sustained HTN. Specifically asked whether MS could be considered. He states the imaging findings are not at all characteristic of demyelinating disease. No further inpatient work up for this.  - Neurology recommends outpatient EMG, nerve conduction study.   Mild vitamin B12 deficiency: Possibly etiology of neurological complaints.  - Start replacement 9/19.   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 Titers checked without evidence of active infection, discussed with ID, Dr. Linus Salmons. IgM is equivocal (negative in setting of no active infection) and IgG negative (indicating no prior infection.  - No further testing or treatment is needed  Chronic pain syndrome: Possibly opioid use disorder. Last MRI LS spine prior to admit from 02/03/2017 with mild multilevel lumbar disc and facet degeneration, no definite neural impingement. - Stop nucynta as this lowers the seizure threshold. - Bowel regimen as ordered.  Nondisplaced left 5th metatarsal fracture:  - Post op shoe recommended per ortho  ADHD:  - Discontinued adderall and beta blocker (not prescribed for primary HTN) - Strongly recommend psychiatry follow  up  Anxiety/depression: Possible unifying diagnosis is undiagnosed/untreated bipolar disorder.  Pt has documented allergies to many mood stabilizers, though would appreciate formal psychiatry follow up to discuss starting these and tapering off klonopin.  - Started abilify and trazodone to help with this and sleep - Would recommend closely supervised taper off clonazepam.   Discharge Instructions Discharge Instructions    Discharge patient    Complete by:  As directed    Discharge disposition:  03-Skilled Nursing Facility   Discharge patient date:  03/27/2017     Allergies as of 03/27/2017      Reactions   Aptiom [eslicarbazepine]    Bupropion Other (See Comments)    Caused depression   Dilantin [phenytoin Sodium Extended]    Diphenhydramine Other (See Comments)   Feels like "spiders are crawling" on her   Divalproex Sodium    Keppra [levetiracetam]    Lamictal [lamotrigine]    Lorazepam Swelling   Hallucinations   Lyrica [pregabalin] Other (See Comments)   Pt reports feeling loopy and confused   Morphine And Related    At high doses   Other    Allergy to steroids causing anaphylaxis     Prednisone       Medication List    TAKE these medications   albuterol 108 (90 Base) MCG/ACT inhaler Commonly known as:  PROVENTIL HFA;VENTOLIN HFA Inhale 2 puffs into the lungs every 6 (six) hours as needed for wheezing.   amphetamine-dextroamphetamine 20 MG tablet Commonly known as:  ADDERALL Take 1 tablet (20 mg total) by mouth 2 (two) times daily. What changed:  when to take this   ARIPiprazole 10 MG tablet Commonly known as:  ABILIFY Take 1 tablet (10 mg total) by mouth daily.   aspirin 81 MG tablet Take 1 tablet (81 mg total) by mouth 2 (two) times daily. What changed:  when to take this   clonazePAM 1 MG tablet Commonly known as:  KLONOPIN Take 1 tablet (1 mg total) by mouth 4 (four) times daily.   cyanocobalamin 2000 MCG tablet Take 1 tablet (2,000 mcg total) by mouth daily.   HYDROcodone-acetaminophen 5-325 MG tablet Commonly known as:  NORCO/VICODIN Take  1-2 tablets by mouth every 6 (six) hours as needed for moderate pain.   metoprolol succinate 50 MG 24 hr tablet Commonly known as:  TOPROL-XL Take 50 mg by mouth daily. Take with or immediately following a meal.   NUCYNTA 50 MG tablet Generic drug:  tapentadol Take 1 tablet (50 mg total) by mouth QID. Taken with meals   ondansetron 4 MG tablet Commonly known as:  ZOFRAN Take 1 tablet (4 mg total) by mouth every 6 (six) hours as needed for nausea.   polyethylene glycol packet Commonly known as:  MIRALAX / GLYCOLAX Take 17 g by mouth daily as needed for mild constipation.   senna 8.6 MG Tabs tablet Commonly known as:  SENOKOT Take 1 tablet (8.6 mg total) by mouth 2 (two) times daily.   traZODone 50 MG tablet Commonly known as:  DESYREL Take 1 tablet (50 mg total) by mouth at bedtime.            Discharge Care Instructions        Start     Ordered   03/28/17 0000  vitamin B-12 2000 MCG tablet  Daily     03/27/17 1208   03/28/17 0000  ARIPiprazole (ABILIFY) 10 MG tablet  Daily     03/27/17 1208   03/27/17 0000  amphetamine-dextroamphetamine (  ADDERALL) 20 MG tablet  2 times daily     03/27/17 1208   03/27/17 0000  clonazePAM (KLONOPIN) 1 MG tablet  4 times daily     03/27/17 1208   03/27/17 0000  traZODone (DESYREL) 50 MG tablet  Daily at bedtime     03/27/17 1208   03/27/17 0000  senna (SENOKOT) 8.6 MG TABS tablet  2 times daily     03/27/17 1208   03/27/17 0000  polyethylene glycol (MIRALAX / GLYCOLAX) packet  Daily PRN     03/27/17 1208   03/27/17 0000  ondansetron (ZOFRAN) 4 MG tablet  Every 6 hours PRN     03/27/17 1208   03/27/17 0000  HYDROcodone-acetaminophen (NORCO/VICODIN) 5-325 MG tablet  Every 6 hours PRN     03/27/17 1208   03/27/17 0000  aspirin 81 MG tablet  2 times daily     03/27/17 1208   03/27/17 0000  NUCYNTA 50 MG tablet  4 times daily     03/27/17 1208   03/27/17 0000  Discharge patient    Question Answer Comment  Discharge disposition  03-Skilled Nursing Facility   Discharge patient date 03/27/2017      03/27/17 1208      Contact information for follow-up providers    Swinteck, Aaron Edelman, MD. Schedule an appointment as soon as possible for a visit in 2 week(s).   Specialty:  Orthopedic Surgery Why:  For wound re-check Contact information: [address]. [address] [place] [address] [phone]        Rubie Maid, MD Follow up.   Specialty:  Family Medicine Contact information: [address] [place] [address] [phone]        Alda Berthold, DO. Schedule an appointment as soon as possible for a visit.   Specialty:  Neurology Contact information: [place] [address] [phone]            Contact information for after-discharge care    [facility] Follow up.   Specialty:  Skilled Nursing Facility Contact information: [address] [address] [phone]                 Allergies  Allergen Reactions  . Aptiom [Eslicarbazepine]   . Bupropion Other (See Comments)     Caused depression  . Dilantin [Phenytoin Sodium Extended]   . Diphenhydramine Other (See Comments)    Feels like "spiders are crawling" on her  . Divalproex Sodium   . Keppra [Levetiracetam]   . Lamictal [Lamotrigine]   . Lorazepam Swelling    Hallucinations  . Lyrica [Pregabalin] Other (See Comments)    Pt reports feeling loopy and confused  . Morphine And Related     At high doses  . Other     Allergy to steroids causing anaphylaxis    . Prednisone     Consultations:  Orthopedics, Dr. Lyla Glassing  Palliative care, Dr. Hilma Favors  Neurology, Dr. Rory Percy  ID, Dr. Linus Salmons by phone 9/18  Procedures/Studies: Dg Chest 1 View  Result Date: 03/21/2017 CLINICAL DATA:  Fall this morning due to weakness. EXAM: CHEST 1 VIEW COMPARISON:  None. FINDINGS: Borderline enlarged cardiac silhouette and mediastinal  contours. Mild pulmonary venous congestion without frank evidence of edema. Minimal left basilar/retrocardiac linear heterogeneous opacities favored to represent atelectasis or scar. No discrete focal airspace opacities. No pleural effusion or pneumothorax. Old left fifth posterolateral rib fracture. No definite acute osseus  abnormalities. IMPRESSION: Pulmonary venous congestion and left basilar atelectasis without definitive superimposed acute cardiopulmonary disease on this AP supine examination. Further evaluation with a PA and lateral chest radiograph may be obtained as clinically indicated. Electronically Signed   By: Sandi Mariscal M.D.   On: 03/21/2017 13:12   Dg Chest 2 View  Result Date: 03/21/2017 CLINICAL DATA:  Preop for hip fracture EXAM: CHEST  2 VIEW COMPARISON:  03/21/2017 at 1205 hours FINDINGS: The heart size and mediastinal contours are within normal limits. Hyperinflated appearance of the lungs. There is scarring or atelectasis at the left lung base. No pneumonic consolidation or CHF. No effusion. The visualized skeletal structures are unremarkable. IMPRESSION: No active cardiopulmonary disease. Pulmonary hyperinflation question COPD. Electronically Signed   By: Ashley Royalty M.D.   On: 03/21/2017 19:18   Dg Lumbar Spine Complete  Result Date: 03/21/2017 CLINICAL DATA:  Post fall this morning now with left-sided back pain EXAM: LUMBAR SPINE - COMPLETE 4+ VIEW COMPARISON:  Lumbar spine MRI  -02/03/2017 FINDINGS: There are 5 non rib-bearing lumbar type vertebral bodies. Normal alignment of lumbar spine. No anterolisthesis or retrolisthesis. Mild (under 25%) compression deformity involving the superior endplate of the L2 vertebral body is unchanged. Remaining lumbar vertebral body heights appear preserved Mild multilevel lumbar spine DDD, worse at L3-L4 and L5-S1 with disc space height loss, endplate irregularity and sclerosis. Bilateral facet degenerative change of the lower lumbar spine.  Limited visualization of the bilateral SI joints is normal. Vascular calcifications overlie the lower pelvis bilaterally. IMPRESSION: 1. No acute findings. 2. Mild multilevel lumbar spine DDD. Electronically Signed   By: Sandi Mariscal M.D.   On: 03/21/2017 13:16   Dg Ankle Complete Right  Result Date: 03/21/2017 CLINICAL DATA:  Pain after fall. EXAM: RIGHT ANKLE - COMPLETE 3+ VIEW COMPARISON:  None. FINDINGS: Calcifications distal to the fibula are chronic in appearance consistent with a remote avulsion injury. There is a lucency through the base of the fifth metatarsal. No other evidence of fracture. The ankle mortise is intact. No other acute abnormalities. IMPRESSION: There is a fracture through the base of the fifth metatarsal. No displacement. Electronically Signed   By: Dorise Bullion III M.D   On: 03/21/2017 13:11   Ct Head Wo Contrast  Result Date: 03/26/2017 CLINICAL DATA:  Seizure and fell out of bed EXAM: CT HEAD WITHOUT CONTRAST CT CERVICAL SPINE WITHOUT CONTRAST TECHNIQUE: Multidetector CT imaging of the head and cervical spine was performed following the standard protocol without intravenous contrast. Multiplanar CT image reconstructions of the cervical spine were also generated. COMPARISON:  01/01/2017 FINDINGS: CT HEAD FINDINGS Brain: No acute territorial infarction, hemorrhage, or intracranial mass. Mild atrophy. Mild small vessel ischemic changes of the white matter. Ventricle size within normal limits. Vascular: No hyperdense vessels. Scattered calcifications at the carotid siphons. Skull: No fracture or suspicious bone lesion Sinuses/Orbits: No acute abnormality Other: None CT CERVICAL SPINE FINDINGS Alignment: No subluxation.  Facet alignment is within normal limits. Skull base and vertebrae: No acute fracture. No primary bone lesion or focal pathologic process. Soft tissues and spinal canal: No prevertebral fluid or swelling. No visible canal hematoma. Disc levels:  Moderate degenerative  changes at C5-C6 and C6-C7. Upper chest: Negative. Other: None IMPRESSION: 1. No CT evidence for acute intracranial abnormality. Atrophy and mild small vessel ischemic changes of the white matter 2. Degenerative changes of the cervical spine. No acute fracture or malalignment. Electronically Signed   By: Donavan Foil M.D.   On:  03/26/2017 03:25   Ct Cervical Spine Wo Contrast  Result Date: 03/26/2017 CLINICAL DATA:  Seizure and fell out of bed EXAM: CT HEAD WITHOUT CONTRAST CT CERVICAL SPINE WITHOUT CONTRAST TECHNIQUE: Multidetector CT imaging of the head and cervical spine was performed following the standard protocol without intravenous contrast. Multiplanar CT image reconstructions of the cervical spine were also generated. COMPARISON:  01/01/2017 FINDINGS: CT HEAD FINDINGS Brain: No acute territorial infarction, hemorrhage, or intracranial mass. Mild atrophy. Mild small vessel ischemic changes of the white matter. Ventricle size within normal limits. Vascular: No hyperdense vessels. Scattered calcifications at the carotid siphons. Skull: No fracture or suspicious bone lesion Sinuses/Orbits: No acute abnormality Other: None CT CERVICAL SPINE FINDINGS Alignment: No subluxation.  Facet alignment is within normal limits. Skull base and vertebrae: No acute fracture. No primary bone lesion or focal pathologic process. Soft tissues and spinal canal: No prevertebral fluid or swelling. No visible canal hematoma. Disc levels:  Moderate degenerative changes at C5-C6 and C6-C7. Upper chest: Negative. Other: None IMPRESSION: 1. No CT evidence for acute intracranial abnormality. Atrophy and mild small vessel ischemic changes of the white matter 2. Degenerative changes of the cervical spine. No acute fracture or malalignment. Electronically Signed   By: Donavan Foil M.D.   On: 03/26/2017 03:25   Mr Brain Wo Contrast  Addendum Date: 03/25/2017   ADDENDUM REPORT: 03/25/2017 06:17 ADDENDUM: The hyperintense T2 weighted  signal within the white matter is most commonly associated with chronic ischemic microangiopathy, rather than chronic hypertensive microangiopathy as stated in the original report. There are no microhemorrhages on this study. The pattern of white matter disease is not suggestive of demyelinating disease. Electronically Signed   By: Ulyses Jarred M.D.   On: 03/25/2017 06:17   Result Date: 03/25/2017 CLINICAL DATA:  Seizures.  Right lower extremity motor deficit. EXAM: MRI HEAD WITHOUT CONTRAST TECHNIQUE: Multiplanar, multiecho pulse sequences of the brain and surrounding structures were obtained without intravenous contrast. COMPARISON:  Head CT 01/01/2017 FINDINGS: Brain: The midline structures are normal. There is no focal diffusion restriction to indicate acute infarct. There is early confluent periventricular and deep white matter leukoaraiosis, most commonly seen in the setting of chronic hypertensive microangiopathy. No intraparenchymal hematoma or chronic microhemorrhage. Brain volume is normal for age without lobar predominant atrophy. The dura is normal and there is no extra-axial collection. Vascular: Major intracranial arterial and venous sinus flow voids are preserved. Skull and upper cervical spine: The visualized skull base, calvarium, upper cervical spine and extracranial soft tissues are normal. Sinuses/Orbits: No fluid levels or advanced mucosal thickening. No mastoid or middle ear effusion. Normal orbits. IMPRESSION: 1. No acute intracranial abnormality. 2. Sequelae of chronic hypertensive microangiopathy. 3. No specific seizure etiology identified. Electronically Signed: By: Ulyses Jarred M.D. On: 03/24/2017 05:46   Mr Cervical Spine Wo Contrast  Result Date: 03/25/2017 CLINICAL DATA:  Seizures and lower extremity weakness EXAM: MRI CERVICAL SPINE WITHOUT CONTRAST TECHNIQUE: Multiplanar, multisequence MR imaging of the cervical spine was performed. No intravenous contrast was administered.  COMPARISON:  None. FINDINGS: Alignment: Normal. Vertebrae: No focal marrow lesion. No compression fracture or evidence of discitis osteomyelitis. Cord: Normal caliber and signal. Posterior Fossa, vertebral arteries, paraspinal tissues: Visualized posterior fossa is normal. Vertebral artery flow voids are preserved. Normal visualized paraspinal soft tissues. Disc levels: C1-C2: Normal. C2-C3: Normal disc space and facet joints. No spinal canal stenosis. No neuroforaminal stenosis. C3-C4: Right-greater-than-left facet hypertrophy. No disc bulge. No spinal canal stenosis. Mild right neuroforaminal stenosis. C4-C5:  Moderate left facet hypertrophy. Normal disc. No spinal canal stenosis. Mild left neuroforaminal stenosis. C5-C6: Small left subarticular protrusion narrowing the ventral thecal space. No spinal canal stenosis. No neuroforaminal stenosis. C6-C7: Normal disc space and facet joints. No spinal canal stenosis. No neuroforaminal stenosis. C7-T1: Normal disc space and facet joints. No spinal canal stenosis. No neuroforaminal stenosis. The T1-T2 level is visualized on sagittal images only. There is no significant disc herniation, spinal canal stenosis or neuroforaminal narrowing. IMPRESSION: 1. No demyelinating lesions or other focal spinal cord abnormality. 2. No cord compression or spinal canal stenosis. 3. Mild multilevel facet predominant degenerative disease with no greater than mild foraminal stenosis. 4. Please see the addendum for the MRI brain of 03/24/2017 for further discussion of the white matter findings seen on that study. Electronically Signed   By: Ulyses Jarred M.D.   On: 03/25/2017 06:19   Mr Lumbar Spine W Wo Contrast  Result Date: 03/21/2017 CLINICAL DATA:  Right foot drop.  Radiculopathy. EXAM: MRI LUMBAR SPINE WITHOUT AND WITH CONTRAST TECHNIQUE: Multiplanar and multiecho pulse sequences of the lumbar spine were obtained without and with intravenous contrast. CONTRAST:  13 cc MultiHance  intravenous COMPARISON:  02/03/2017 FINDINGS: Segmentation:  Standard. Alignment:  Borderline L5-S1 anterolisthesis. Vertebrae:  No fracture, evidence of discitis, or bone lesion. Conus medullaris: Extends to the T12 level and appears normal. Paraspinal and other soft tissues: Disc levels: T12- L1: Unremarkable. L1-L2: Minor disc height loss and bulging. Negative facets. No impingement L2-L3: Mild interspinous degenerative change.  No impingement. L3-L4: Minor disc bulging and mild facet spurring.  No impingement L4-L5: Minor annulus bulging. There is a small right paracentral disc protrusion with annular fissure seen on sagittal acquisition. Borderline facet spurring. No impingement L5-S1:Facet arthropathy with borderline anterolisthesis. Minor annulus bulging. No impingement noted. No abnormal enhancement along the L5 nerve root. IMPRESSION: 1. No explanation for foot drop.  No L5 impingement. 2. Noncompressive degenerative changes are stable from 02/03/2017 and described above. Electronically Signed   By: Monte Fantasia M.D.   On: 03/21/2017 19:06   Pelvis Portable  Result Date: 03/22/2017 CLINICAL DATA:  Postoperative assessment the pelvis, left total hip arthroplasty EXAM: PORTABLE PELVIS 1-2 VIEWS COMPARISON:  Intraoperative images from 03/22/2017 FINDINGS: Left hip prosthesis is in place with good positioning and alignment. No acute fracture or early complicating feature identified. IMPRESSION: 1. Left total hip prosthesis in place without early complicating feature observed. Electronically Signed   By: Van Clines M.D.   On: 03/22/2017 12:59   Dg Foot Complete Right  Result Date: 03/21/2017 CLINICAL DATA:  Post fall this morning, now with right foot pain. EXAM: RIGHT FOOT COMPLETE - 3+ VIEW COMPARISON:  None. FINDINGS: Osteopenia. Seen only on the lateral radiograph is a serpiginous lucency involving the base of the fifth metatarsal worrisome for a nondisplaced fracture. Joint spaces are  preserved. No significant hallux valgus deformity. No erosions. Note is made of an os tibialis externum as well as an os peroneus. Large plantar calcaneal spur. Regional soft tissues appear normal. No radiopaque foreign body. IMPRESSION: Suspected nondisplaced fracture involving the base of the fifth metatarsal. Correlation for point tenderness at this location is recommended. Electronically Signed   By: Sandi Mariscal M.D.   On: 03/21/2017 13:14   Dg C-arm 1-60 Min  Result Date: 03/22/2017 CLINICAL DATA:  Left total hip prosthesis placement, intraoperative assessment EXAM: DG C-ARM 61-120 MIN; OPERATIVE LEFT HIP WITH PELVIS COMPARISON:  None. FINDINGS: Two frontal intraoperative radiographs of the left  hip and central pelvis demonstrate a left total hip prosthesis in place without visible fracture or complicating feature on these fluoroscopic spot images. IMPRESSION: 1. Left total hip prosthesis in place without complicating feature identified. Electronically Signed   By: Van Clines M.D.   On: 03/22/2017 13:00   Dg Hip Operative Unilat W Or W/o Pelvis Left  Result Date: 03/22/2017 CLINICAL DATA:  Left total hip prosthesis placement, intraoperative assessment EXAM: DG C-ARM 61-120 MIN; OPERATIVE LEFT HIP WITH PELVIS COMPARISON:  None. FINDINGS: Two frontal intraoperative radiographs of the left hip and central pelvis demonstrate a left total hip prosthesis in place without visible fracture or complicating feature on these fluoroscopic spot images. IMPRESSION: 1. Left total hip prosthesis in place without complicating feature identified. Electronically Signed   By: Van Clines M.D.   On: 03/22/2017 13:00   Dg Hip Unilat With Pelvis 2-3 Views Left  Result Date: 03/26/2017 CLINICAL DATA:  Left hip replacement, fall EXAM: DG HIP (WITH OR WITHOUT PELVIS) 2-3V LEFT COMPARISON:  03/25/2017, 09/15/ 2018 FINDINGS: Status post left hip replacement with normal alignment. No acute displaced fracture.  Soft tissue gas over the left hip and proximal thigh is felt secondary to recent surgical status. IMPRESSION: Status post left hip replacement. No acute osseous abnormality. Soft tissue gas felt consistent with recent surgery Electronically Signed   By: Donavan Foil M.D.   On: 03/26/2017 02:49   Dg Hip Unilat With Pelvis 2-3 Views Left  Result Date: 03/25/2017 CLINICAL DATA:  Sharp left hip pain after total hip replacement. EXAM: DG HIP (WITH OR WITHOUT PELVIS) 2-3V LEFT COMPARISON:  Pelvic x-rays dated March 22, 2017. FINDINGS: Postsurgical changes related to left total hip arthroplasty. No evidence of hardware failure or loosening. Components remain well aligned. No periprosthetic fracture. Subcutaneous emphysema, likely postsurgical. IMPRESSION: Left total hip arthroplasty without evidence of hardware complication. Electronically Signed   By: Titus Dubin M.D.   On: 03/25/2017 15:58   Dg Hip Unilat With Pelvis 2-3 Views Left  Result Date: 03/21/2017 CLINICAL DATA:  Initial encounter for Fall this morning due to weakness; Painful left hip, right ankle; Pt was unable to maintain dorsiflexion of the foot for imaging; Also, due to the left hip fracture, she could not be sat up for lateral chest or rolled onto lef.*comment was truncated* EXAM: DG HIP (WITH OR WITHOUT PELVIS) 2-3V LEFT COMPARISON:  None. FINDINGS: Femoral heads are located. Subcapital left femoral neck fracture with lateral displacement and varus angulation of distal fracture fragment. IMPRESSION: Angulated, displaced left femoral neck fracture. Electronically Signed   By: Abigail Miyamoto M.D.   On: 03/21/2017 13:15   Subjective: No events overnight, slept better with trazodone. No seizures.   Discharge Exam: Vitals:   03/26/17 2007 03/27/17 0603  BP: 124/63 105/68  Pulse: 76 74  Resp: 18 17  Temp: 98.9 F (37.2 C) 98 F (36.7 C)  SpO2: 100% 99%   General: Pt is alert, awake, not in acute distress Cardiovascular: RRR,  S1/S2 +, no rubs, no gallops Respiratory: CTA bilaterally, no wheezing, no rhonchi Abdominal: Soft, NT, ND, bowel sounds + Ext: Lateral LLE dressing c/d/i Neuro: Alert, oriented. Diminished right foot strength with dorsiflexion, plantarflexion intact. Normal sensation with tenderness over lateral plantar surface. Left plantarflexion intact with 4/5 dorsiflexion. Reported paresthetic LLE sensation unchanged. Psych: Judgement and insight appear fair. Mood anxious and affect broad.   Labs: Basic Metabolic Panel:  Recent Labs Lab 03/21/17 1118 03/21/17 2017 03/22/17 0311 03/23/17 0221 03/24/17  0314  NA 140  --  141 141 143  K 3.0*  --  3.7 3.8 3.5  CL 104  --  108 111 113*  CO2 24  --  28 25 27   GLUCOSE 97  --  96 107* 87  BUN 13  --  8 14 17   CREATININE 0.70  --  0.71 0.72 0.62  CALCIUM 8.6*  --  8.8* 8.3* 7.9*  MG  --  1.8  --   --   --    Liver Function Tests:  Recent Labs Lab 03/21/17 1118 03/22/17 0311  AST 20 16  ALT 13* 11*  ALKPHOS 93 82  BILITOT 1.4* 1.4*  PROT 7.0 6.1*  ALBUMIN 3.4* 3.0*   CBC:  Recent Labs Lab 03/21/17 1108 03/22/17 0311 03/23/17 0221 03/24/17 0314 03/25/17 0547  WBC 5.0 5.7 8.7 6.1 4.1  NEUTROABS 4.0  --   --   --   --   HGB 12.8 12.5 9.3* 7.9* 8.1*  HCT 41.3 38.9 29.3* 25.1* 25.9*  MCV 87.7 87.2 86.2 86.9 86.9  PLT 221 216 190 196 218   Cardiac Enzymes:  Recent Labs Lab 03/21/17 1118  CKTOTAL 57   Hgb A1c  Recent Labs  03/27/17 0755  HGBA1C 4.8   Thyroid function studies  Recent Labs  03/27/17 0755  TSH 1.055   Anemia work up  Recent Labs  03/24/17 1459  VITAMINB12 174*   Urinalysis    Component Value Date/Time   COLORURINE YELLOW 03/21/2017 [place] 03/21/2017 1816   LABSPEC 1.016 03/21/2017 1816   PHURINE 5.0 03/21/2017 1816   GLUCOSEU NEGATIVE 03/21/2017 1816   HGBUR NEGATIVE 03/21/2017 1816   BILIRUBINUR NEGATIVE 03/21/2017 1816   KETONESUR 5 (A) 03/21/2017 1816   PROTEINUR  NEGATIVE 03/21/2017 1816   UROBILINOGEN 0.2 12/21/2014 1740   NITRITE NEGATIVE 03/21/2017 1816   LEUKOCYTESUR NEGATIVE 03/21/2017 1816    Microbiology Recent Results (from the past 240 hour(s))  MRSA PCR Screening     Status: None   Collection Time: 03/21/17  6:49 PM  Result Value Ref Range Status   MRSA by PCR NEGATIVE NEGATIVE Final    Comment:        The GeneXpert MRSA Assay (FDA approved for NASAL specimens only), is one component of a comprehensive MRSA colonization surveillance program. It is not intended to diagnose MRSA infection nor to guide or monitor treatment for MRSA infections.     Time coordinating discharge: Approximately 40 minutes  Vance Gather, MD  [facility] 03/27/2017, 12:08 PM Pager [phone]

## 2017-03-27 NOTE — Progress Notes (Signed)
Rehab admissions - I received a denial for acute inpatient rehab admission from [facility].  I have made the social worker and case manager aware of denial.  Insurance carrier would authorize SNF for short term rehab.  Call me for questions.  #[phone]

## 2017-03-28 ENCOUNTER — Other Ambulatory Visit: Payer: Self-pay | Admitting: Internal Medicine

## 2017-03-28 MED ORDER — ARIPIPRAZOLE 15 MG PO TABS: 15.0000 mg | ORAL_TABLET | Freq: Every day | ORAL | 2 refills | Status: DC

## 2017-03-28 MED ORDER — CLONAZEPAM 1 MG PO TABS: 1.0000 mg | ORAL_TABLET | Freq: Three times a day (TID) | ORAL | 0 refills | Status: DC

## 2017-03-28 MED ORDER — TRAZODONE HCL 50 MG PO TABS: 50.0000 mg | ORAL_TABLET | Freq: Every day | ORAL | 3 refills | Status: DC

## 2017-03-28 NOTE — Progress Notes (Unsigned)
Patient released from SNF-facility would not provide prescriptions.

## 2017-03-30 ENCOUNTER — Other Ambulatory Visit: Payer: Self-pay | Admitting: Internal Medicine

## 2017-03-30 DIAGNOSIS — M21371 Foot drop, right foot: Secondary | ICD-10-CM

## 2017-03-30 DIAGNOSIS — R9089 Other abnormal findings on diagnostic imaging of central nervous system: Secondary | ICD-10-CM

## 2017-03-30 MED ORDER — DICLOFENAC EPOLAMINE 1.3 % TD PTCH: 1.0000 | MEDICATED_PATCH | Freq: Two times a day (BID) | TRANSDERMAL | 0 refills | Status: AC

## 2017-03-31 ENCOUNTER — Encounter: Payer: Self-pay | Admitting: Neurology

## 2017-03-31 LAB — URINE DRUGS OF ABUSE SCREEN W ALC, ROUTINE (REF LAB)
Barbiturate, Ur: NEGATIVE ng/mL
Cannabinoid Quant, Ur: NEGATIVE ng/mL
Cocaine (Metab.): NEGATIVE ng/mL
Ethanol U, Quan: NEGATIVE %
OPIATE QUANT UR: NEGATIVE ng/mL
PHENCYCLIDINE, UR: NEGATIVE ng/mL
Propoxyphene, Urine: NEGATIVE ng/mL

## 2017-03-31 LAB — DRUG PROFILE 799031: BENZODIAZEPINES: NEGATIVE

## 2017-03-31 LAB — AMPHETAMINE CONF, UR
AMPHETAMINE CONF, UR: 830 ng/mL
AMPHETAMINE: POSITIVE — AB
AMPHETAMINES, URINE: POSITIVE — AB
METHAMPHETAMINE, UR: NEGATIVE

## 2017-04-27 ENCOUNTER — Other Ambulatory Visit: Payer: Self-pay | Admitting: Internal Medicine

## 2017-04-27 DIAGNOSIS — Z515 Encounter for palliative care: Secondary | ICD-10-CM

## 2017-04-27 DIAGNOSIS — M79605 Pain in left leg: Secondary | ICD-10-CM

## 2017-04-27 MED ORDER — HYDROCODONE-ACETAMINOPHEN 10-325 MG PO TABS: 1.0000 | ORAL_TABLET | Freq: Four times a day (QID) | ORAL | 0 refills | Status: AC | PRN

## 2017-04-27 MED ORDER — HYDROCODONE-ACETAMINOPHEN 10-325 MG PO TABS: 1.0000 | ORAL_TABLET | Freq: Four times a day (QID) | ORAL | 0 refills | Status: DC | PRN

## 2017-05-11 ENCOUNTER — Ambulatory Visit: Payer: Self-pay | Admitting: Neurology

## 2017-05-12 ENCOUNTER — Other Ambulatory Visit: Payer: Self-pay | Admitting: Internal Medicine

## 2017-05-12 DIAGNOSIS — G90522 Complex regional pain syndrome I of left lower limb: Secondary | ICD-10-CM

## 2017-07-08 ENCOUNTER — Encounter: Payer: Self-pay | Admitting: Neurology

## 2017-07-08 ENCOUNTER — Ambulatory Visit (INDEPENDENT_AMBULATORY_CARE_PROVIDER_SITE_OTHER): Payer: Medicare HMO | Admitting: Neurology

## 2017-07-08 VITALS — BP 134/82 | HR 107 | Ht 66.5 in | Wt 145.0 lb

## 2017-07-08 DIAGNOSIS — R569 Unspecified convulsions: Secondary | ICD-10-CM | POA: Diagnosis not present

## 2017-07-08 DIAGNOSIS — G40909 Epilepsy, unspecified, not intractable, without status epilepticus: Secondary | ICD-10-CM | POA: Diagnosis not present

## 2017-07-08 MED ORDER — CLONAZEPAM 1 MG PO TABS: ORAL_TABLET | ORAL | 5 refills | Status: DC

## 2017-07-08 NOTE — Patient Instructions (Signed)
1. Schedule 72-hour EEG  2. Continue clonazepam 1mg  4 times a day 3. Follow-up after EEG  Seizure Precautions: 1. If medication has been prescribed for you to prevent seizures, take it exactly as directed.  Do not stop taking the medicine without talking to your doctor first, even if you have not had a seizure in a long time.   2. Avoid activities in which a seizure would cause danger to yourself or to others.  Don't operate dangerous machinery, swim alone, or climb in high or dangerous places, such as on ladders, roofs, or girders.  Do not drive unless your doctor says you may.  3. If you have any warning that you may have a seizure, lay down in a safe place where you can't hurt yourself.    4.  No driving for 6 months from last seizure, as per [facility].   Please refer to the following link on the [place] website for more information: http://www.epilepsyfoundation.org/answerplace/Social/driving/drivingu.cfm   5.  Maintain good sleep hygiene. Avoid alcohol.  6.  Contact your doctor if you have any problems that may be related to the medicine you are taking.  7.  Call 911 and bring the patient back to the ED if:        A.  The seizure lasts longer than 5 minutes.       B.  The patient doesn't awaken shortly after the seizure  C.  The patient has new problems such as difficulty seeing, speaking or moving  D.  The patient was injured during the seizure  E.  The patient has a temperature over 102 F (39C)  F.  The patient vomited and now is having trouble breathing

## 2017-07-08 NOTE — Progress Notes (Signed)
NEUROLOGY CONSULTATION NOTE  Kelli Navarro MRN: [id number] DOB: 02-27-1951  Referring provider: Dr. Lane Hacker Primary care provider: Dr. Rubie Maid  Reason for consult:  seizures  Dear Dr Hilma Favors:  Thank you for your kind referral of Kelli Navarro for consultation of the above symptoms. Although her history is well known to you, please allow me to reiterate it for the purpose of our medical record. The patient was accompanied to the clinic by her wife who also provides collateral information. Records and images were personally reviewed where available.  HISTORY OF PRESENT ILLNESS: This is a 67 year old right-handed woman with a history of hypertension, chronic back pain, seizures, restless leg syndrome, and ADHD, presenting to establish care for her seizures. Records from her prior neurologist Dr. [name] from [facility] were reviewed. It appears she was mostly being followed for back pain. The patient reports she was being prescribed narcotics and up to 468m BID of Topamax daily, which caused her to be dizzy. She weaned herself off narcotic medication. She reports that her seizures started around 15 years ago when she had fevers and weight loss. There was initial concern about leukemia, she had a bone marrow biopsy and was diagnosed several months later (age 67 [facility]. She states the CDC contacted her telling her "it would never be gone, and can affect different parts of my body, prominently her head." She was given antibiotics and the fevers went down, but she continued to feel unwell. She kept having episodes of loss of consciousness. She would talk the dogs out for a walk or be changing a lightbulb and pass out, falling off a ladder one time. Her wife has only witnessed one GTC many years ago, she was sitting on the chair then had a blank stare, unresponsive, then started shaking, with her head turned to one side. This lasted a  couple of minutes, no tongue bite or incontinence. She reports that over time, she would go years with no daytime seizures, but continues to have nocturnal seizures occurring around 3am, she would wake up on the floor in between the desk and bed. Sometimes she was up feeling unwell. Her wife sleeps in a different room and has not witnessed the nocturnal seizures, but can tell when she has had one. Her wife reports another episode where she heard a fall last 03/21/17, she was found to have a displaced femoral neck fracture, as well as a right foot drop with fracture of the 5th metatarsal. She has been told in the past that her seizures are due to a combination of stress and pain. She has injured herself several times due to the seizures, she has had a lumbar fracture, rib fractures, fractured both feet, broke her nose falling down a flight of stairs. She has also tripped from being weak and wobbly after a seizure. She occasionally has a feeling where her head feels unclear, she can talk, denies any olfactory/gustatory hallucinations, rising epigastric sensation, focal numbness/tingling. She reports that she does not have daytime seizures because she can "block it out better," but at night she "can't control her subconscious" and has the night time seizures more. Her wife notes that when she is focused on something, she is "in a mode" and would not respond initially, then says she is okay.   She has bilateral hand tremors and sometimes has difficulty reading her handwriting. She is scheduled for nerve ablation in her back on the 7th. They report she has  a lot of problems when she is stressed and her back is hurting, she would be overwhelmed, screaming out, more depressed, and have more seizures. She has had migraines her "whole life," which she treats with biofeedback. She reports the last migraines was triggered by an unrecalled medication from her psychiatrist, this occurred a week ago. She has migraines twice a  week lasting a couple of days, with associated nausea/vomiting, photosensitivity. Her balance is not good, she has to hold on to furniture. She has difficulties bending down, when she bends for the faucet, she feels disoriented (not spinning). She takes clonazepam 14m 4 times a day, initially she was taking it for RLS, then dose was increased for the seizures. Per records, she has tried several AEDs in the past, including Topamax, Tegretol (depression), Dilantin (rash), Depakote (depression), Aptiom, Ativan (swelling, hallucinations), Lamictal (headache), Vimpat (dizziness), Lyrica (edema), and Keppra (depression). She has had a 24-hour EEG reported as normal. She also reports having a home EEG that was on for a week(?) which was also normal. This was in her 67s She is in Abilify, Doxepin, and Adderall prescribed by her psychiatrist.   Epilepsy Risk Factors:  She had a normal birth and early development.  There is no history of febrile convulsions, CNS infections such as meningitis/encephalitis, significant traumatic brain injury, neurosurgical procedures, or family history of seizures.  PAST MEDICAL HISTORY: Past Medical History:  Diagnosis Date  . ADHD   . Chronic back pain   . Depression   . Hypertension   . Right [place] ([place]   . [facility]   . Seizures ([place]     PAST SURGICAL HISTORY: Past Surgical History:  Procedure Laterality Date  . none    . 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 albuterol (PROVENTIL HFA;VENTOLIN HFA) 108 (90 BASE) MCG/ACT inhaler Inhale 2 puffs into the lungs every 6 (six) hours as needed for wheezing.    .Marland Kitchenamphetamine-dextroamphetamine (ADDERALL) 20 MG tablet Take by mouth.    .  ARIPiprazole (ABILIFY) 15 MG tablet Take 1 tablet (15 mg total) by mouth daily. 30 tablet 2  . aspirin 81 MG tablet Take 1 tablet (81 mg total) by mouth 2 (two) times daily.    . clonazePAM (KLONOPIN) 1 MG tablet Take 1 tablet (1 mg total) by mouth 3 (three) times daily. 90 tablet 0  . polyethylene glycol (MIRALAX / GLYCOLAX) packet Take 17 g by mouth daily as needed for mild constipation. 14 each 0  . doxepin (SINEQUAN) 10 MG capsule TAKE 1 TO 2 CAPSULES BY MOUTH AT BEDTIME  0  . vitamin B-12 2000 MCG tablet Take 1 tablet (2,000 mcg total) by mouth daily.     No current facility-administered medications on file prior to visit.     ALLERGIES: Allergies  Allergen Reactions  . Aptiom [Eslicarbazepine]   . Bupropion Other (See Comments)     Caused depression  . Dilantin [Phenytoin Sodium Extended]   . Diphenhydramine Other (See Comments)    Feels like "spiders are crawling" on her  . Divalproex Sodium   . Keppra [Levetiracetam]   . Lamictal [Lamotrigine]   . Lorazepam Swelling    Hallucinations  . Lyrica [Pregabalin] Other (See Comments)    Pt reports feeling  loopy and confused  . Morphine And Related     At high doses  . Other     Allergy to steroids causing anaphylaxis    . Prednisone     FAMILY HISTORY: Family History  Problem Relation Age of Onset  . Multiple sclerosis Mother   . Heart Problems Mother        by-pass  . Heart Problems Sister        pacemaker    SOCIAL HISTORY: Social History   Socioeconomic History  . Marital status: Single    Spouse name: Not on file  . Number of children: Not on file  . Years of education: Not on file  . Highest education level: Not on file  Social Needs  . Financial resource strain: Not on file  . Food insecurity - worry: Not on file  . Food insecurity - inability: Not on file  . Transportation needs - medical: Not on file  . Transportation needs - non-medical: Not on file  Occupational History  . Not on file  Tobacco  Use  . Smoking status: Never Smoker  . Smokeless tobacco: Never Used  Substance and Sexual Activity  . Alcohol use: No  . Drug use: No  . Sexual activity: Not on file  Other Topics Concern  . Not on file  Social History Narrative  . Not on file    REVIEW OF SYSTEMS: Constitutional: No fevers, chills, or sweats, no generalized fatigue, change in appetite Eyes: No visual changes, double vision, eye pain Ear, nose and throat: No hearing loss, ear pain, nasal congestion, sore throat Cardiovascular: No chest pain, palpitations Respiratory:  No shortness of breath at rest or with exertion, wheezes GastrointestinaI: No nausea, vomiting, diarrhea, abdominal pain, fecal incontinence Genitourinary:  No dysuria, urinary retention or frequency Musculoskeletal:  + neck pain, back pain Integumentary: No rash, pruritus, skin lesions Neurological: as above Psychiatric: + depression, anxiety Endocrine: No palpitations, fatigue, diaphoresis, mood swings, change in appetite, change in weight, increased thirst Hematologic/Lymphatic:  No anemia, purpura, petechiae. Allergic/Immunologic: no itchy/runny eyes, nasal congestion, recent allergic reactions, rashes  PHYSICAL EXAM: Vitals:   07/08/17 0854  BP: 134/82  Pulse: (!) 107  SpO2: 97%   General: No acute distress Head:  Normocephalic/atraumatic Eyes: Fundoscopic exam shows bilateral sharp discs, no vessel changes, exudates, or hemorrhages Neck: supple, no paraspinal tenderness, full range of motion Back: No paraspinal tenderness Heart: regular rate and rhythm Lungs: Clear to auscultation bilaterally. Vascular: No carotid bruits. Skin/Extremities: No rash, no edema Neurological Exam: Mental status: alert and oriented to person, place, and time, no dysarthria or aphasia, Fund of knowledge is appropriate.  Recent and remote memory are intact.  Attention and concentration are normal.    Able to name objects and repeat phrases. Cranial  nerves: CN I: not tested CN II: pupils equal, round and reactive to light, visual fields intact, fundi unremarkable. CN III, IV, VI:  full range of motion, no nystagmus, no ptosis CN V: facial sensation intact CN VII: upper and lower face symmetric CN VIII: hearing intact to finger rub CN IX, X: gag intact, uvula midline CN XI: sternocleidomastoid and trapezius muscles intact CN XII: tongue midline Bulk & Tone: normal, no fasciculations. Motor: 5/5 throughout with no pronator drift. Sensation: intact to light touch, cold, pin, vibration and joint position sense.  No extinction to double simultaneous stimulation.  Romberg test negative Deep Tendon Reflexes: +2 throughout, no ankle clonus Plantar responses: downgoing bilaterally Cerebellar: no incoordination on  finger to nose testing Gait: slow and cautious reporting left hip imbalance Tremor: none  IMPRESSION: This is a 67 year old right-handed woman with a history of hypertension, chronic back pain, seizures, restless leg syndrome, and ADHD, presenting to establish care for her seizures. 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 She reports seizures are triggered by stress and pain. However, she has nocturnal episodes where she has fallen off the bed and injured herself. She has had side effects to multiple AEDs, and takes clonazepam 42m four times a day for seizures prophylaxis and restless leg syndrome. We discussed the need for further seizure classification before adding on another AED. She will be scheduled for a 72-hour ambulatory EEG. She does not drive. She will follow-up after the EEG and knows to call for any changes.    Thank you for allowing me to participate in the care of this patient. Please do not hesitate to call for any questions or concerns.   KEllouise Newer  M.D.  CC: Dr. EBebe Shaggy Dr. GHilma Favors

## 2017-07-13 ENCOUNTER — Telehealth: Payer: Self-pay | Admitting: Neurology

## 2017-07-13 NOTE — Telephone Encounter (Signed)
Dr. Delice Lesch does not prescribe this pt Adderall and does not control other providers prescribing.

## 2017-07-13 NOTE — Telephone Encounter (Signed)
Patient needs to have a letter sent to her behavioral health provider that it is ok for her to take 3 Adderalls

## 2017-07-20 ENCOUNTER — Telehealth: Payer: Self-pay | Admitting: Neurology

## 2017-07-20 NOTE — Telephone Encounter (Signed)
Pt only has 1 Rx with our office.  Klonopin, which was sent to pharmacy on 07/08/17 with 5 remaining refills.

## 2017-07-20 NOTE — Telephone Encounter (Signed)
Pt called and needed a refill on medications and both are controlled and need to be called in

## 2017-09-01 ENCOUNTER — Ambulatory Visit (INDEPENDENT_AMBULATORY_CARE_PROVIDER_SITE_OTHER): Payer: Medicare HMO

## 2017-09-01 DIAGNOSIS — E538 Deficiency of other specified B group vitamins: Secondary | ICD-10-CM | POA: Diagnosis not present

## 2017-09-01 MED ORDER — CYANOCOBALAMIN 1000 MCG/ML IJ SOLN
1000.0000 ug | Freq: Once | INTRAMUSCULAR | Status: AC
  Administered 2017-09-01: 1000 ug via INTRAMUSCULAR

## 2017-09-08 ENCOUNTER — Ambulatory Visit: Payer: Medicare HMO

## 2017-09-15 ENCOUNTER — Ambulatory Visit (INDEPENDENT_AMBULATORY_CARE_PROVIDER_SITE_OTHER): Payer: Medicare HMO

## 2017-09-15 DIAGNOSIS — E538 Deficiency of other specified B group vitamins: Secondary | ICD-10-CM

## 2017-09-15 MED ORDER — CYANOCOBALAMIN 1000 MCG/ML IJ SOLN
1000.0000 ug | Freq: Once | INTRAMUSCULAR | Status: AC
  Administered 2017-09-15: 1000 ug via INTRAMUSCULAR

## 2017-09-22 ENCOUNTER — Ambulatory Visit (INDEPENDENT_AMBULATORY_CARE_PROVIDER_SITE_OTHER): Payer: Medicare HMO

## 2017-09-22 DIAGNOSIS — E538 Deficiency of other specified B group vitamins: Secondary | ICD-10-CM | POA: Diagnosis not present

## 2017-09-22 MED ORDER — CYANOCOBALAMIN 1000 MCG/ML IJ SOLN
1000.0000 ug | Freq: Once | INTRAMUSCULAR | Status: AC
  Administered 2017-09-22: 1000 ug via INTRAMUSCULAR

## 2017-09-24 ENCOUNTER — Telehealth: Payer: Self-pay | Admitting: Neurology

## 2017-09-24 DIAGNOSIS — E538 Deficiency of other specified B group vitamins: Secondary | ICD-10-CM

## 2017-09-24 NOTE — Telephone Encounter (Signed)
It depends on her B12 level, would re-check first.

## 2017-09-24 NOTE — Telephone Encounter (Signed)
Patient called regarding her B12 injections. She would like to increase her injections to 2 a week? She said she can tell that she has a vitamin Deficiency because she has a sore on her mouth. Please Call. Thanks

## 2017-09-24 NOTE — Telephone Encounter (Signed)
Spoke with pt relaying message below.  She states that she is scheduled to come for injection on Monday and will have her labs drawn just prior to injection.  Advised that she needs to check in at our front desk.  Orders placed in system.

## 2017-09-24 NOTE — Telephone Encounter (Signed)
Please advise 

## 2017-09-28 ENCOUNTER — Other Ambulatory Visit: Payer: Medicare HMO

## 2017-09-28 ENCOUNTER — Ambulatory Visit (INDEPENDENT_AMBULATORY_CARE_PROVIDER_SITE_OTHER): Payer: Medicare HMO | Admitting: *Deleted

## 2017-09-28 DIAGNOSIS — E538 Deficiency of other specified B group vitamins: Secondary | ICD-10-CM

## 2017-09-28 MED ORDER — CYANOCOBALAMIN 1000 MCG/ML IJ SOLN
1000.0000 ug | Freq: Once | INTRAMUSCULAR | Status: AC
  Administered 2017-09-28: 1000 ug via INTRAMUSCULAR

## 2017-09-29 ENCOUNTER — Telehealth: Payer: Self-pay

## 2017-09-29 ENCOUNTER — Ambulatory Visit: Payer: Self-pay

## 2017-09-29 LAB — VITAMIN B12: Vitamin B-12: 1924 pg/mL — ABNORMAL HIGH (ref 200–1100)

## 2017-09-29 NOTE — Telephone Encounter (Signed)
Called and spoke with pt's spouse, [name].  Asked [name] to have pt return call so I can ask when she had her b12 lab drawn. Did not relay message below.

## 2017-09-29 NOTE — Telephone Encounter (Signed)
-----   Message from Cameron Sprang, MD sent at 09/29/2017 12:30 PM EDT ----- Did she have bloodwork done before or after her B12 shot? It is elevated, no need to give more than how she is getting it now. If she would like it more frequent, she can ask her PCP if they would do that. Thanks

## 2017-09-30 ENCOUNTER — Telehealth: Payer: Self-pay

## 2017-09-30 NOTE — Telephone Encounter (Signed)
Spoke with pt - she states that she had her b12 blood draw immediately prior to her last injection.  She states that she has been doing weekly injections for 7 months now.  I advised that she should start doing monthly injections and we will re-check levels after 2 or 3 months

## 2017-09-30 NOTE — Telephone Encounter (Signed)
Error - pls see previous phone encounter

## 2017-09-30 NOTE — Telephone Encounter (Signed)
Called pt again.  No answer.  LMOVM asking for return call to the office.

## 2017-11-06 ENCOUNTER — Other Ambulatory Visit: Payer: Medicare HMO

## 2017-11-06 ENCOUNTER — Ambulatory Visit (INDEPENDENT_AMBULATORY_CARE_PROVIDER_SITE_OTHER): Payer: Medicare HMO

## 2017-11-06 ENCOUNTER — Other Ambulatory Visit: Payer: Self-pay

## 2017-11-06 DIAGNOSIS — E538 Deficiency of other specified B group vitamins: Secondary | ICD-10-CM

## 2017-11-06 MED ORDER — CYANOCOBALAMIN 1000 MCG/ML IJ SOLN
1000.0000 ug | Freq: Once | INTRAMUSCULAR | Status: AC
  Administered 2017-11-06: 1000 ug via INTRAMUSCULAR

## 2017-11-07 LAB — TIQ-NTM

## 2017-11-07 LAB — VITAMIN B12: Vitamin B-12: 1178 pg/mL — ABNORMAL HIGH (ref 200–1100)

## 2017-11-09 ENCOUNTER — Telehealth: Payer: Self-pay

## 2017-11-09 NOTE — Telephone Encounter (Signed)
Spoke with pt relaying message below.  Advised pt to f/u with PCP in regards to b12.

## 2017-11-09 NOTE — Telephone Encounter (Signed)
-----   Message from Cameron Sprang, MD sent at 11/09/2017  9:02 AM EDT ----- Pls let her know the B12 level was high. Thanks

## 2017-12-09 ENCOUNTER — Ambulatory Visit (INDEPENDENT_AMBULATORY_CARE_PROVIDER_SITE_OTHER): Payer: Medicare HMO | Admitting: Neurology

## 2017-12-09 ENCOUNTER — Encounter: Payer: Self-pay | Admitting: Neurology

## 2017-12-09 VITALS — BP 140/84 | HR 81 | Ht 66.5 in | Wt 144.4 lb

## 2017-12-09 DIAGNOSIS — R569 Unspecified convulsions: Secondary | ICD-10-CM

## 2017-12-09 MED ORDER — TOPIRAMATE 100 MG PO TABS: ORAL_TABLET | ORAL | 11 refills | Status: DC

## 2017-12-09 MED ORDER — CLONAZEPAM 1 MG PO TABS: ORAL_TABLET | ORAL | 5 refills | Status: DC

## 2017-12-09 NOTE — Patient Instructions (Signed)
1. Continue Topamax 100mg : take 2 tablets twice a day 2. Continue clonazepam 1mg  four times a day. Try weaning down slowly to three times a day 3. Follow-up in 6 months, call for any changes  Seizure Precautions: 1. If medication has been prescribed for you to prevent seizures, take it exactly as directed.  Do not stop taking the medicine without talking to your doctor first, even if you have not had a seizure in a long time.   2. Avoid activities in which a seizure would cause danger to yourself or to others.  Don't operate dangerous machinery, swim alone, or climb in high or dangerous places, such as on ladders, roofs, or girders.  Do not drive unless your doctor says you may.  3. If you have any warning that you may have a seizure, lay down in a safe place where you can't hurt yourself.    4.  No driving for 6 months from last seizure, as per [facility].   Please refer to the following link on the [place] website for more information: http://www.epilepsyfoundation.org/answerplace/Social/driving/drivingu.cfm   5.  Maintain good sleep hygiene. Avoid alcohol.  6.  Contact your doctor if you have any problems that may be related to the medicine you are taking.  7.  Call 911 and bring the patient back to the ED if:        A.  The seizure lasts longer than 5 minutes.       B.  The patient doesn't awaken shortly after the seizure  C.  The patient has new problems such as difficulty seeing, speaking or moving  D.  The patient was injured during the seizure  E.  The patient has a temperature over 102 F (39C)  F.  The patient vomited and now is having trouble breathing

## 2017-12-09 NOTE — Progress Notes (Signed)
NEUROLOGY FOLLOW UP OFFICE NOTE  Kelli Navarro [id number] 1951-04-23  HISTORY OF PRESENT ILLNESS: I had the pleasure of seeing Kelli Navarro in follow-up in the neurology clinic on 12/09/2017.  The patient was last seen 4 months ago for seizures. She is alone in the office today. Records and images were personally reviewed where available.  Since her last visit, she has undergone back surgery in January and reports that she has been doing significantly well since then. The week after her surgery, she was having 3 seizures every night, again stating her seizures are triggered by pain and stress. Since then, she has been seizure-free. She has not had any daytime seizures in many years. She denies any staring/unresponsive episodes or gaps in time. She is in good spirits today and reports she has tried driving short distances and gets real anxious when driving. She continues to do physical therapy and is hoping to start yoga soon for balance. She restarted Topamax previously prescribed by her prior neurologist, taking Topamax '100mg'$  2 tabs BID without side effects. She continues on clonazepam '1mg'$  four times a day. She has found it does not help her sleep anymore, she has been taking this for 17 years. She has occasional migraines. She denies any dizziness, vision changes, no falls.   HPI 07/08/2017: This is a 67 yo RH woman with a history of hypertension, chronic back pain, seizures, restless leg syndrome, and ADHD, presenting to establish care for her seizures. Records from her prior neurologist Dr. [name] from [facility] were reviewed. It appears she was mostly being followed for back pain. The patient reports she was being prescribed narcotics and up to '400mg'$  BID of Topamax daily, which caused her to be dizzy. She weaned herself off narcotic medication. She reports that her seizures started around 15 years ago when she had fevers and weight loss. There was initial concern about leukemia,  she had a bone marrow biopsy and was diagnosed several months later (age 67) with [facility] Spotted Fever. She states the CDC contacted her telling her "it would never be gone, and can affect different parts of my body, prominently her head." She was given antibiotics and the fevers went down, but she continued to feel unwell. She kept having episodes of loss of consciousness. She would talk the dogs out for a walk or be changing a lightbulb and pass out, falling off a ladder one time. Her wife has only witnessed one GTC many years ago, she was sitting on the chair then had a blank stare, unresponsive, then started shaking, with her head turned to one side. This lasted a couple of minutes, no tongue bite or incontinence. She reports that over time, she would go years with no daytime seizures, but continues to have nocturnal seizures occurring around 3am, she would wake up on the floor in between the desk and bed. Sometimes she was up feeling unwell. Her wife sleeps in a different room and has not witnessed the nocturnal seizures, but can tell when she has had one. Her wife reports another episode where she heard a fall last 03/21/17, she was found to have a displaced femoral neck fracture, as well as a right foot drop with fracture of the 5th metatarsal. She has been told in the past that her seizures are due to a combination of stress and pain. She has injured herself several times due to the seizures, she has had a lumbar fracture, rib fractures, fractured both feet, broke her nose falling  down a flight of stairs. She has also tripped from being weak and wobbly after a seizure. She occasionally has a feeling where her head feels unclear, she can talk, denies any olfactory/gustatory hallucinations, rising epigastric sensation, focal numbness/tingling. She reports that she does not have daytime seizures because she can "block it out better," but at night she "can't control her subconscious" and has the night  time seizures more. Her wife notes that when she is focused on something, she is "in a mode" and would not respond initially, then says she is okay.   She has bilateral hand tremors and sometimes has difficulty reading her handwriting. She is scheduled for nerve ablation in her back on the 7th. They report she has a lot of problems when she is stressed and her back is hurting, she would be overwhelmed, screaming out, more depressed, and have more seizures. She has had migraines her "whole life," which she treats with biofeedback. She reports the last migraines was triggered by an unrecalled medication from her psychiatrist, this occurred a week ago. She has migraines twice a week lasting a couple of days, with associated nausea/vomiting, photosensitivity. Her balance is not good, she has to hold on to furniture. She has difficulties bending down, when she bends for the faucet, she feels disoriented (not spinning). She takes clonazepam '1mg'$  4 times a day, initially she was taking it for RLS, then dose was increased for the seizures. Per records, she has tried several AEDs in the past, including Topamax, Tegretol (depression), Dilantin (rash), Depakote (depression), Aptiom, Ativan (swelling, hallucinations), Lamictal (headache), Vimpat (dizziness), Lyrica (edema), and Keppra (depression). She has had a 24-hour EEG reported as normal. She also reports having a home EEG that was on for a week(?) which was also normal. This was in her 48s. She is in Abilify, Doxepin, and Adderall prescribed by her psychiatrist.   Epilepsy Risk Factors:  She had a normal birth and early development.  There is no history of febrile convulsions, CNS infections such as meningitis/encephalitis, significant traumatic brain injury, neurosurgical procedures, or family history of seizures.  Prior AEDs: Topamax, Tegretol (depression), Dilantin (rash), Depakote (depression), Aptiom, Ativan (swelling, hallucinations), Lamictal (headache),  Vimpat (dizziness), Lyrica (edema), and Keppra (depression)  PAST MEDICAL HISTORY: Past Medical History:  Diagnosis Date  . ADHD   . Chronic back pain   . Depression   . Hypertension   . Right femoral fracture ([place])   . [facility] spotted fever   . Seizures [facility])     MEDICATIONS:  Outpatient Encounter Medications as of 12/09/2017  Medication Sig  . albuterol (PROVENTIL HFA;VENTOLIN HFA) 108 (90 BASE) MCG/ACT inhaler Inhale 2 puffs into the lungs every 6 (six) hours as needed for wheezing.  Marland Kitchen amphetamine-dextroamphetamine (ADDERALL) 20 MG tablet Take by mouth.  Marland Kitchen aspirin 81 MG tablet Take 1 tablet (81 mg total) by mouth 2 (two) times daily.  . clonazePAM (KLONOPIN) 1 MG tablet Take 1 tablet 4 times a day  . topiramate (TOPAMAX) 100 MG tablet Take 2 tablets twice a day  . [DISCONTINUED] clonazePAM (KLONOPIN) 1 MG tablet Take 1 tablet 4 times a day  . [DISCONTINUED] topiramate (TOPAMAX) 100 MG tablet Take 100 mg by mouth 2 (two) times daily. Take 2 tabs BID  . [DISCONTINUED] ARIPiprazole (ABILIFY) 15 MG tablet Take 1 tablet (15 mg total) by mouth daily.  . [DISCONTINUED] Cyanocobalamin (B-12 COMPLIANCE INJECTION IJ) Inject as directed.  . [DISCONTINUED] doxepin (SINEQUAN) 10 MG capsule TAKE 1 TO 2 CAPSULES  BY MOUTH AT BEDTIME  . [DISCONTINUED] polyethylene glycol (MIRALAX / GLYCOLAX) packet Take 17 g by mouth daily as needed for mild constipation.   No facility-administered encounter medications on file as of 12/09/2017.     ALLERGIES: Allergies  Allergen Reactions  . Aptiom [Eslicarbazepine]   . Bupropion Other (See Comments)     Caused depression  . Dilantin [Phenytoin Sodium Extended]   . Diphenhydramine Other (See Comments)    Feels like "spiders are crawling" on her  . Divalproex Sodium   . Keppra [Levetiracetam]   . Lamictal [Lamotrigine]   . Lorazepam Swelling    Hallucinations  . Lyrica [Pregabalin] Other (See Comments)    Pt reports feeling loopy and confused  .  Morphine And Related     At high doses  . Other     Allergy to steroids causing anaphylaxis    . Prednisone     FAMILY HISTORY: Family History  Problem Relation Age of Onset  . Multiple sclerosis Mother   . Heart Problems Mother        by-pass  . Heart Problems Sister        pacemaker    SOCIAL HISTORY: Social History   Socioeconomic History  . Marital status: Single    Spouse name: Not on file  . Number of children: Not on file  . Years of education: Not on file  . Highest education level: Not on file  Occupational History  . Not on file  Social Needs  . Financial resource strain: Not on file  . Food insecurity:    Worry: Not on file    Inability: Not on file  . Transportation needs:    Medical: Not on file    Non-medical: Not on file  Tobacco Use  . Smoking status: Never Smoker  . Smokeless tobacco: Never Used  Substance and Sexual Activity  . Alcohol use: No  . Drug use: No  . Sexual activity: Not on file  Lifestyle  . Physical activity:    Days per week: Not on file    Minutes per session: Not on file  . Stress: Not on file  Relationships  . Social connections:    Talks on phone: Not on file    Gets together: Not on file    Attends religious service: Not on file    Active member of club or organization: Not on file    Attends meetings of clubs or organizations: Not on file    Relationship status: Not on file  . Intimate partner violence:    Fear of current or ex partner: Not on file    Emotionally abused: Not on file    Physically abused: Not on file    Forced sexual activity: Not on file  Other Topics Concern  . 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     REVIEW OF SYSTEMS: Constitutional: No fevers, chills, or sweats, no generalized fatigue, change in appetite Eyes: No visual changes, double vision, eye  pain Ear, nose and throat: No hearing loss, ear pain, nasal congestion, sore throat Cardiovascular: No chest pain, palpitations Respiratory:  No shortness of breath at rest or with exertion, wheezes GastrointestinaI: No nausea, vomiting, diarrhea, abdominal pain, fecal incontinence Genitourinary:  No dysuria, urinary retention or frequency Musculoskeletal:  No neck pain,  back pain Integumentary: No rash, pruritus, skin lesions Neurological: as above Psychiatric: No depression, insomnia, anxiety Endocrine: No palpitations, fatigue, diaphoresis, mood swings, change in appetite, change in weight, increased thirst Hematologic/Lymphatic:  No anemia, purpura, petechiae. Allergic/Immunologic: no itchy/runny eyes, nasal congestion, recent allergic reactions, rashes  PHYSICAL EXAM: Vitals:   12/09/17 1256  BP: 140/84  Pulse: 81  SpO2: 92%   General: No acute distress Head:  Normocephalic/atraumatic Neck: supple, no paraspinal tenderness, full range of motion Heart:  Regular rate and rhythm Lungs:  Clear to auscultation bilaterally Back: No paraspinal tenderness Skin/Extremities: No rash, no edema Neurological Exam: alert and oriented to person, place, and time. No aphasia or dysarthria. Fund of knowledge is appropriate.  Recent and remote memory are intact.  Attention and concentration are normal.    Able to name objects and repeat phrases. Cranial nerves: Pupils equal, round, reactive to light.  Extraocular movements intact with no nystagmus. Visual fields full. Facial sensation intact. No facial asymmetry. Tongue, uvula, palate midline.  Motor: Bulk and tone normal, muscle strength 5/5 throughout with no pronator drift.  Sensation to light touch intact.  No extinction to double simultaneous stimulation.  Deep tendon reflexes 2+ throughout, toes downgoing.  Finger to nose testing intact.  Gait narrow-based and steady, mild difficulty with tandem walk but able.  Romberg  negative.  IMPRESSION: This is a 67 yo RH woman with a history of hypertension, chronic back pain, restless leg syndrome, ADHD, and seizures. 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 However, she has nocturnal episodes where she has fallen off the bed and injured herself. She reports significant improvement in symptoms since back surgery, no nocturnal seizures since January 2019. No daytime seizures in many years. She is back on Topamax '100mg'$  2 tabs BID and takes clonazepam '1mg'$  four times a day for seizure prophylaxis and restless leg syndrome. We discussed that now that she is back on Topamax, we can potentially slowly wean down clonazepam, she will try taking 3 a day and monitor symptoms. We discussed [place] driving laws, no driving until 6 months seizure-free. She will follow-up in 6 months and knows to call for any changes.    Thank you for allowing me to participate in her care.  Please do not hesitate to call for any questions or concerns.  The duration of this appointment visit was 20 minutes of face-to-face time with the patient.  Greater than 50% of this time was spent in counseling, explanation of diagnosis, planning of further management, and coordination of care.   Ellouise Newer, M.D.   CC: Dr. Bebe Shaggy

## 2018-05-25 ENCOUNTER — Encounter: Payer: Self-pay | Admitting: Emergency Medicine

## 2018-05-25 DIAGNOSIS — F39 Unspecified mood [affective] disorder: Secondary | ICD-10-CM | POA: Insufficient documentation

## 2018-05-25 DIAGNOSIS — G4700 Insomnia, unspecified: Secondary | ICD-10-CM | POA: Insufficient documentation

## 2018-06-02 ENCOUNTER — Other Ambulatory Visit: Payer: Self-pay | Admitting: Neurology

## 2018-06-02 MED ORDER — CLONAZEPAM 1 MG PO TABS: ORAL_TABLET | ORAL | 5 refills | Status: DC

## 2018-06-02 NOTE — Telephone Encounter (Signed)
*  Change to Rx* LOV note states pt to wean down to 1 Tab TID from 1 tab QID  Clonazepam 1mg  #90 with 1 refill Sig = take 1 tablet 3 times a day  Forwarded to Dr. Delice Lesch for approval.

## 2018-06-02 NOTE — Telephone Encounter (Signed)
Patient called regarding her Clonazepam medication. She is needing a refill prescription  sent to [facility] on [address]. Thanks

## 2018-06-08 ENCOUNTER — Ambulatory Visit (INDEPENDENT_AMBULATORY_CARE_PROVIDER_SITE_OTHER): Payer: Medicare HMO | Admitting: Psychiatry

## 2018-06-08 ENCOUNTER — Encounter: Payer: Self-pay | Admitting: Psychiatry

## 2018-06-08 VITALS — BP 137/83 | HR 100

## 2018-06-08 DIAGNOSIS — F419 Anxiety disorder, unspecified: Secondary | ICD-10-CM

## 2018-06-08 DIAGNOSIS — F39 Unspecified mood [affective] disorder: Secondary | ICD-10-CM

## 2018-06-08 DIAGNOSIS — F5101 Primary insomnia: Secondary | ICD-10-CM

## 2018-06-08 MED ORDER — DOXEPIN HCL 10 MG PO CAPS: ORAL_CAPSULE | ORAL | 1 refills | Status: DC

## 2018-06-08 MED ORDER — CLONIDINE HCL 0.1 MG PO TABS: 0.1000 mg | ORAL_TABLET | Freq: Three times a day (TID) | ORAL | 1 refills | Status: DC

## 2018-06-08 MED ORDER — BREXPIPRAZOLE 4 MG PO TABS: 4.0000 mg | ORAL_TABLET | Freq: Every day | ORAL | 2 refills | Status: DC

## 2018-06-08 NOTE — Progress Notes (Signed)
Kelli Navarro [id number] December 15, 1950 67 y.o.  Subjective:   Patient ID:  Kelli Navarro is a 67 y.o. (DOB 1950/08/21) female.  Chief Complaint:  Chief Complaint  Patient presents with  . Follow-up    h/o insomnia, depression, anxiety    HPI Kelli Navarro presents to the office today for follow-up of mood, anxiety, and insomnia. Reports that she sought medical attention after fall and was found to have have fractures in her hand and foot. Reports that she had to wear casts on her upper and lower extremities for 6 weeks. Reports that she was not able to wear a boot due to potential negative effects on back. Reports that she was not able to feed herself and had severely limited mobility. Reports that pain management options were limited due to long-term medications. She reports that taking Doxepin 30 mg po QHS was effective for her insomnia and minimize sleep disturbance due to pain. She reports that she sleeps 4.5-5 hours a time. She reports that after recovering she went on a 4-week vacation to the beach. She reports that she lost weight, then gained 4 lbs on vacation, and then limited caloric intake to 400-600 kcal qd. Appetite is now stable. She reports that at the time she thought medication was causing fatigue and that fatigue did not change. She reports that she then realized it was likely due to "severe dieting" and then started eating regularly and fatigue resolved. She reports "my mood has been pretty good." She reports that she has had some difficulty with concentration and focus at times. Reports that she has episodes of increased energy and goal-directed activity, such as cleaning excessively or taking something apart and putting it back together. Reports that physical activity is helpful but she is limited due to back surgery. Denies anxiety. Denies SI.  Review of Systems:  Review of Systems  Constitutional: Negative for fatigue.  Musculoskeletal: Positive for back pain.  Negative for gait problem.       Reports back pain with activity  Neurological: Negative for dizziness.       Reports occasional tremors.   Psychiatric/Behavioral:       Please refer to HPI    Medications: I have reviewed the patient's current medications.  Current Outpatient Medications  Medication Sig Dispense Refill  . amphetamine-dextroamphetamine (ADDERALL) 20 MG tablet Take 20 mg by mouth 3 (three) times daily.     Marland Kitchen aspirin 81 MG tablet Take 1 tablet (81 mg total) by mouth 2 (two) times daily. (Patient taking differently: Take 81 mg by mouth daily. )    . Brexpiprazole (REXULTI) 4 MG TABS Take 2 mg by mouth daily.     . clonazePAM (KLONOPIN) 1 MG tablet Take 1 tablet 4 times a day 120 tablet 5  . cloNIDine (CATAPRES) 0.1 MG tablet Take 1 tablet (0.1 mg total) by mouth 3 (three) times daily. 270 tablet 1  . doxepin (SINEQUAN) 10 MG capsule Take 2 -3 caps po QHS 270 capsule 1  . topiramate (TOPAMAX) 100 MG tablet Take 2 tablets twice a day 120 tablet 11  . albuterol (PROVENTIL HFA;VENTOLIN HFA) 108 (90 BASE) MCG/ACT inhaler Inhale 2 puffs into the lungs every 6 (six) hours as needed for wheezing.    . Brexpiprazole (REXULTI) 4 MG TABS Take 4 mg by mouth daily. Take 1/2-1 tab po qd 30 tablet 2   No current facility-administered medications for this visit.     Medication Side Effects: None  Allergies:  Allergies  Allergen Reactions  .  Aptiom [Eslicarbazepine]   . Azithromycin   . Bupropion Other (See Comments)     Caused depression  . Carbamazepine   . Dilantin [Phenytoin Sodium Extended]   . Diphenhydramine Other (See Comments)    Feels like "spiders are crawling" on her  . Divalproex Sodium   . Keppra [Levetiracetam]   . Lamictal [Lamotrigine]   . Lorazepam Swelling    Hallucinations  . Lyrica [Pregabalin] Other (See Comments)    Pt reports feeling loopy and confused  . Morphine And Related     At high doses  . Other     Allergy to steroids causing anaphylaxis     . Prednisone   . Vimpat [Lacosamide]     Past Medical History:  Diagnosis Date  . ADHD   . Chronic back pain   . Depression   . Hypertension   . Right femoral fracture ([place])   . [facility] spotted fever   . Seizures ([place])   . Vitamin B 12 deficiency     Family History  Problem Relation Age of Onset  . Multiple sclerosis Mother   . Heart Problems Mother        by-pass  . Alcohol abuse Mother   . Heart Problems Sister        pacemaker    Social History   Socioeconomic History  . Marital status: Married    Spouse name: Not on file  . Number of children: 2  . Years of education: 69  . Highest education level: Not on file  Occupational History  . 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 Financial resource strain: Not on file  . Food insecurity:    Worry: Not on file    Inability: Not on file  . Transportation needs:    Medical: Not on file    Non-medical: Not on file  Tobacco Use  . Smoking status: Never Smoker  . Smokeless tobacco: Never Used  Substance and Sexual Activity  . Alcohol use: No  . Drug use: No  . Sexual activity: Not on file  Lifestyle  . Physical activity:    Days per week: Not on file    Minutes per session: Not on file  . Stress: Not on file  Relationships  . Social connections:    Talks on phone: Not on file    Gets together: Not on file    Attends religious service: Not on file    Active member of club or organization: Not on file    Attends meetings of clubs or organizations: Not on file    Relationship status: Not on file  . Intimate partner violence:    Fear of current or ex partner: Not on file    Emotionally abused: Not on file    Physically abused: Not on file    Forced sexual activity: Not on file  Other Topics Concern  . 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   Please see review of systems for further details on the patient's review from today.   Objective:   Physical Exam:  BP 137/83   Pulse 100   Physical Exam  Constitutional: She is oriented to person, place,  and time. She appears well-developed. No distress.  Musculoskeletal: She exhibits no deformity.  Neurological: She is alert and oriented to person, place, and time. Coordination normal.  Psychiatric: She has a normal mood and affect. Her speech is normal and behavior is normal. Judgment and thought content normal. Her mood appears not anxious. Her affect is not angry, not blunt, not labile and not inappropriate. Cognition and memory are normal. She does not exhibit a depressed mood. She expresses no homicidal and no suicidal ideation. She expresses no suicidal plans and no homicidal plans.  Insight intact. No auditory or visual hallucinations. No delusions.     Lab Review:     Component Value Date/Time   NA 143 03/24/2017 0314   K 3.5 03/24/2017 0314   CL 113 (H) 03/24/2017 0314   CO2 27 03/24/2017 0314   GLUCOSE 87 03/24/2017 0314   BUN 17 03/24/2017 0314   CREATININE 0.62 03/24/2017 0314   CALCIUM 7.9 (L) 03/24/2017 0314   PROT 6.1 (L) 03/22/2017 0311   ALBUMIN 3.0 (L) 03/22/2017 0311   AST 16 03/22/2017 0311   ALT 11 (L) 03/22/2017 0311   ALKPHOS 82 03/22/2017 0311   BILITOT 1.4 (H) 03/22/2017 0311   GFRNONAA >60 03/24/2017 0314   GFRAA >60 03/24/2017 0314       Component Value Date/Time   WBC 4.1 03/25/2017 0547   RBC 2.98 (L) 03/25/2017 0547   HGB 8.1 (L) 03/25/2017 0547   HCT 25.9 (L) 03/25/2017 0547   PLT 218 03/25/2017 0547   MCV 86.9 03/25/2017 0547   MCH 27.2 03/25/2017 0547   MCHC 31.3 03/25/2017 0547   RDW 14.4 03/25/2017 0547   LYMPHSABS 0.6 (L) 03/21/2017 1108   MONOABS 0.4 03/21/2017 1108   EOSABS 0.0 03/21/2017 1108   BASOSABS 0.0 03/21/2017 1108     No results found for: POCLITH, LITHIUM   No results found for: PHENYTOIN, PHENOBARB, VALPROATE, CBMZ   .res Assessment: Plan:   Continue current medications for insomnia, mood, and anxiety. Primary insomnia - Plan: doxepin (SINEQUAN) 10 MG capsule  Mood disorder (HCC) - Plan: Brexpiprazole (REXULTI) 4 MG TABS  Anxiety - Plan: cloNIDine (CATAPRES) 0.1 MG tablet  Please see After Visit Summary for patient specific instructions.  Future Appointments  Date Time Provider [place]  07/23/2018  3:00 PM Cameron Sprang, MD [facility] None  10/08/2018  1:00 PM Thayer Headings, PMHNP [facility] None    No orders of the defined types were placed in this encounter.     -------------------------------

## 2018-07-23 ENCOUNTER — Ambulatory Visit (INDEPENDENT_AMBULATORY_CARE_PROVIDER_SITE_OTHER): Payer: Medicare HMO | Admitting: Neurology

## 2018-07-23 ENCOUNTER — Encounter: Payer: Self-pay | Admitting: Neurology

## 2018-07-23 ENCOUNTER — Other Ambulatory Visit: Payer: Self-pay

## 2018-07-23 VITALS — BP 120/84 | HR 93 | Ht 66.5 in | Wt 123.0 lb

## 2018-07-23 DIAGNOSIS — R569 Unspecified convulsions: Secondary | ICD-10-CM

## 2018-07-23 MED ORDER — TOPIRAMATE 100 MG PO TABS: ORAL_TABLET | ORAL | 11 refills | Status: DC

## 2018-07-23 MED ORDER — CLONAZEPAM 1 MG PO TABS: ORAL_TABLET | ORAL | 5 refills | Status: DC

## 2018-07-23 NOTE — Progress Notes (Signed)
NEUROLOGY FOLLOW UP OFFICE NOTE  Chaniyah Jahr [id number] [birth year]  HISTORY OF PRESENT ILLNESS: I had the pleasure of seeing Kelli Navarro in follow-up in the neurology clinic on 07/23/2018.  The patient was last seen 7 months ago for seizures. She is alone in the office today. She denies any seizures since January 2019. She is taking Topamax '200mg'$  BID and clonazepam '1mg'$  four times a day. This was initially started by her prior neurologist for RLS then dose was increased for the seizures. She states that it helps with her anxiety. She has ADHD which leads to a lot of anxiety. She states "my doctors can't seem to agree on anything" regarding her weight and her hip. She states her psychiatrist started her on B12 injections. Her last B12 level in our system in May 2019 was 1178. Her left hip issues have restricted her activities. She has headaches from time to time, no dizziness or vision changes. She reports a fall injuring her right foot and right hand but did not require surgery.   HPI 07/08/2017: This is a 68 yo RH woman with a history of hypertension, chronic back pain, seizures, restless leg syndrome, and ADHD, presenting to establish care for her seizures. Records from her prior neurologist Dr. [name] from [facility] were reviewed. It appears she was mostly being followed for back pain. The patient reports she was being prescribed narcotics and up to '400mg'$  BID of Topamax daily, which caused her to be dizzy. She weaned herself off narcotic medication. She reports that her seizures started around 15 years ago when she had fevers and weight loss. 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 She states the CDC contacted her telling her "it would never be gone, and can affect different parts of my body, prominently her head." She was given antibiotics and the fevers went down, but  she continued to feel unwell. She kept having episodes of loss of consciousness. She would talk the dogs out for a walk or be changing a lightbulb and pass out, falling off a ladder one time. Her wife has only witnessed one GTC many years ago, she was sitting on the chair then had a blank stare, unresponsive, then started shaking, with her head turned to one side. This lasted a couple of minutes, no tongue bite or incontinence. She reports that over time, she would go years with no daytime seizures, but continues to have nocturnal seizures occurring around 3am, she would wake up on the floor in between the desk and bed. Sometimes she was up feeling unwell. Her wife sleeps in a different room and has not witnessed the nocturnal seizures, but can tell when she has had one. Her wife reports another episode where she heard a fall last 03/21/17, she was found to have a displaced femoral neck fracture, as well as a right foot drop with fracture of the 5th metatarsal. She has been told in the past that her seizures are due to a combination of stress and pain. She has injured herself several times due to the seizures, she has had a lumbar fracture, rib fractures, fractured both feet, broke her nose falling down a flight of stairs. She has also tripped from being weak and wobbly after a seizure. She occasionally has a feeling where her head feels unclear, she can talk, denies any olfactory/gustatory hallucinations, rising epigastric sensation, focal numbness/tingling. She reports that  she does not have daytime seizures because she can "block it out better," but at night she "can't control her subconscious" and has the night time seizures more. Her wife notes that when she is focused on something, she is "in a mode" and would not respond initially, then says she is okay.   She has bilateral hand tremors and sometimes has difficulty reading her handwriting. She is scheduled for nerve ablation in her back on the 7th. They  report she has a lot of problems when she is stressed and her back is hurting, she would be overwhelmed, screaming out, more depressed, and have more seizures. She has had migraines her "whole life," which she treats with biofeedback. She reports the last migraines was triggered by an unrecalled medication from her psychiatrist, this occurred a week ago. She has migraines twice a week lasting a couple of days, with associated nausea/vomiting, photosensitivity. Her balance is not good, she has to hold on to furniture. She has difficulties bending down, when she bends for the faucet, she feels disoriented (not spinning). She takes clonazepam '1mg'$  4 times a day, initially she was taking it for RLS, then dose was increased for the seizures. Per records, she has tried several AEDs in the past, including Topamax, Tegretol (depression), Dilantin (rash), Depakote (depression), Aptiom, Ativan (swelling, hallucinations), Lamictal (headache), Vimpat (dizziness), Lyrica (edema), and Keppra (depression). She has had a 24-hour EEG reported as normal. She also reports having a home EEG that was on for a week(?) which was also normal. This was in her 72s. She is in Abilify, Doxepin, and Adderall prescribed by her psychiatrist.   Epilepsy Risk Factors:  She had a normal birth and early development.  There is no history of febrile convulsions, CNS infections such as meningitis/encephalitis, significant traumatic brain injury, neurosurgical procedures, or family history of seizures.  Prior AEDs: Topamax, Tegretol (depression), Dilantin (rash), Depakote (depression), Aptiom, Ativan (swelling, hallucinations), Lamictal (headache), Vimpat (dizziness), Lyrica (edema), and Keppra (depression)  PAST MEDICAL HISTORY: Past Medical History:  Diagnosis Date  . ADHD   . Chronic back pain   . Depression   . Hypertension   . Right femoral fracture ([place])   . [facility] spotted fever   . Seizures ([place])   . Vitamin B 12 deficiency      MEDICATIONS:  Outpatient Encounter Medications as of 07/23/2018  Medication Sig  . albuterol (PROVENTIL HFA;VENTOLIN HFA) 108 (90 BASE) MCG/ACT inhaler Inhale 2 puffs into the lungs every 6 (six) hours as needed for wheezing.  Marland Kitchen amphetamine-dextroamphetamine (ADDERALL) 20 MG tablet Take 20 mg by mouth 3 (three) times daily.   . ARIPiprazole (ABILIFY) 10 MG tablet aripiprazole 10 mg tablet  . aspirin 81 MG tablet Take 1 tablet (81 mg total) by mouth 2 (two) times daily. (Patient taking differently: Take 81 mg by mouth daily. )  . Brexpiprazole (REXULTI) 4 MG TABS Take 2 mg by mouth daily.   . Brexpiprazole (REXULTI) 4 MG TABS Take 4 mg by mouth daily. Take 1/2-1 tab po qd  . clonazePAM (KLONOPIN) 1 MG tablet Take 1 tablet 4 times a day  . cloNIDine (CATAPRES) 0.1 MG tablet Take 1 tablet (0.1 mg total) by mouth 3 (three) times daily.  Marland Kitchen doxepin (SINEQUAN) 10 MG capsule Take 2 -3 caps po QHS  . topiramate (TOPAMAX) 100 MG tablet Take 2 tablets twice a day   No facility-administered encounter medications on file as of 07/23/2018.     ALLERGIES: Allergies  Allergen Reactions  .  Aptiom [Eslicarbazepine]   . Azithromycin   . Bupropion Other (See Comments)     Caused depression  . Carbamazepine   . Dilantin [Phenytoin Sodium Extended]   . Diphenhydramine Other (See Comments)    Feels like "spiders are crawling" on her  . Divalproex Sodium   . Keppra [Levetiracetam]   . Lamictal [Lamotrigine]   . Lorazepam Swelling    Hallucinations  . Lyrica [Pregabalin] Other (See Comments)    Pt reports feeling loopy and confused  . Morphine And Related     At high doses  . Other     Allergy to steroids causing anaphylaxis    . Prednisone   . Vimpat [Lacosamide]     FAMILY HISTORY: Family History  Problem Relation Age of Onset  . Multiple sclerosis Mother   . Heart Problems Mother        by-pass  . Alcohol abuse Mother   . Heart Problems Sister        pacemaker    SOCIAL  HISTORY: Social History   Socioeconomic History  . Marital status: Married    Spouse name: Not on file  . Number of children: 2  . Years of education: 20  . Highest education level: Not on file  Occupational History  . 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 Financial resource strain: Not on file  . Food insecurity:    Worry: Not on file    Inability: Not on file  . Transportation needs:    Medical: Not on file    Non-medical: Not on file  Tobacco Use  . Smoking status: Never Smoker  . Smokeless tobacco: Never Used  Substance and Sexual Activity  . Alcohol use: No  . Drug use: No  . Sexual activity: Not on file  Lifestyle  . Physical activity:    Days per week: Not on file    Minutes per session: Not on file  . Stress: Not on file  Relationships  . Social connections:    Talks on phone: Not on file    Gets together: Not on file    Attends religious service: Not on file    Active member of club or organization: Not on file    Attends meetings of clubs or organizations: Not on file    Relationship status: Not on file  . Intimate partner violence:    Fear of current or ex partner: Not on file    Emotionally abused: Not on file    Physically abused: Not on file    Forced sexual activity: Not on file  Other Topics Concern  . 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     REVIEW OF SYSTEMS: Constitutional: No fevers, chills, or sweats, no generalized fatigue, change in appetite Eyes: No visual changes, double vision, eye pain Ear, nose and throat: No hearing loss, ear pain, nasal congestion, sore throat Cardiovascular: No chest pain, palpitations Respiratory:  No shortness of breath at rest or with exertion, wheezes GastrointestinaI: No nausea, vomiting, diarrhea, abdominal pain, fecal incontinence Genitourinary:  No dysuria, urinary  retention or frequency Musculoskeletal:  No neck pain, back pain Integumentary: No rash, pruritus, skin lesions Neurological: as above Psychiatric: No depression, insomnia, +anxiety Endocrine: No palpitations, fatigue, diaphoresis, mood swings, change in appetite, change in weight, increased thirst Hematologic/Lymphatic:  No anemia, purpura, petechiae. Allergic/Immunologic: no itchy/runny eyes, nasal congestion, recent allergic reactions, rashes  PHYSICAL EXAM: Vitals:   07/23/18 1510  BP: 120/84  Pulse: 93  SpO2: 96%   General: No acute distress Head:  Normocephalic/atraumatic Neck: supple, no paraspinal tenderness, full range of motion Heart:  Regular rate and rhythm Lungs:  Clear to auscultation bilaterally Back: No paraspinal tenderness Skin/Extremities: No rash, no edema Neurological Exam: alert and oriented to person, place, and time. No aphasia or dysarthria. Fund of knowledge is appropriate.  Recent and remote memory are intact.  Attention and concentration are normal.    Able to name objects and repeat phrases. Cranial nerves: Pupils equal, round, reactive to light.  Extraocular movements intact with no nystagmus. Visual fields full. Facial sensation intact. No facial asymmetry. Tongue, uvula, palate midline.  Motor: Bulk and tone normal, muscle strength 5/5 throughout with no pronator drift.  Sensation to light touch intact.  No extinction to double simultaneous stimulation.  Finger to nose testing intact.  Gait slow and cautious due to left hip pain, no ataxia.  IMPRESSION: This is a 68 yo RH woman with a history of hypertension, chronic back pain, restless leg syndrome, ADHD, and seizures. 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 However, she has nocturnal  episodes where she has fallen off the bed and injured herself. No nocturnal seizures since January 2019. No daytime seizures in many years. She is taking Topamax '200mg'$  BID and clonazepam '1mg'$  four times a day for seizure prophylaxis and restless leg syndrome. We again discussed weaning down clonazepam, she states she would like to stay on current dose as it helps with anxiety. Continue follow-up with Psychiatry. She is aware of [place] driving laws to stop driving until 6 months seizure-free. Follow-up in 1 year, she knows to call for any changes.    Thank you for allowing me to participate in her care.  Please do not hesitate to call for any questions or concerns.  The duration of this appointment visit was 20 minutes of face-to-face time with the patient.  Greater than 50% of this time was spent in counseling, explanation of diagnosis, planning of further management, and coordination of care.   Ellouise Newer, M.D.   CC: Dr. Bebe Shaggy

## 2018-07-23 NOTE — Patient Instructions (Addendum)
1. Continue all your medications 2. Follow-up with all your providers, follow-up in 1 year in our office,call for any changes  Seizure Precautions: 1. If medication has been prescribed for you to prevent seizures, take it exactly as directed.  Do not stop taking the medicine without talking to your doctor first, even if you have not had a seizure in a long time.   2. Avoid activities in which a seizure would cause danger to yourself or to others.  Don't operate dangerous machinery, swim alone, or climb in high or dangerous places, such as on ladders, roofs, or girders.  Do not drive unless your doctor says you may.  3. If you have any warning that you may have a seizure, lay down in a safe place where you can't hurt yourself.    4.  No driving for 6 months from last seizure, as per [facility].   Please refer to the following link on the [place] website for more information: http://[email or web address]/[email or web address]/[email or web address]   5.  Maintain good sleep hygiene. Avoid alcohol.  6.  Contact your doctor if you have any problems that may be related to the medicine you are taking.  7.  Call 911 and bring the patient back to the ED if:        A.  The seizure lasts longer than 5 minutes.       B.  The patient doesn't awaken shortly after the seizure  C.  The patient has new problems such as difficulty seeing, speaking or moving  D.  The patient was injured during the seizure  E.  The patient has a temperature over 102 F (39C)  F.  The patient vomited and now is having trouble breathing

## 2018-07-27 ENCOUNTER — Encounter: Payer: Self-pay | Admitting: Neurology

## 2018-08-06 ENCOUNTER — Telehealth: Payer: Self-pay | Admitting: Neurology

## 2018-08-06 ENCOUNTER — Other Ambulatory Visit: Payer: Self-pay | Admitting: Neurology

## 2018-08-06 MED ORDER — CLONAZEPAM 2 MG PO TABS: ORAL_TABLET | ORAL | 5 refills | Status: DC

## 2018-08-06 NOTE — Telephone Encounter (Signed)
Attempted PA - was told that it may be easier to get approved if we were to change dose to 2mg , sig = 1/2 tab QID.  Is this okay Dr. Delice Lesch?

## 2018-08-06 NOTE — Telephone Encounter (Signed)
Spoke with pt advising that Rx was sent to pharmacy around 1Pm. Pt states that she was unable to get through to pharmacy.    I called pharmacy and verified that Rx was received.

## 2018-08-06 NOTE — Telephone Encounter (Signed)
Spoke with pt's wife, [name], advising that we have changed Rx to 2mg  tab - half tab QID.  [name] verbalized understanding and was appreciative of my call.

## 2018-08-06 NOTE — Telephone Encounter (Signed)
Okay, pls let her know of the changes and to only take 1/2 tablets as prescribed, we will not be increasing dose. Thanks

## 2018-08-06 NOTE — Telephone Encounter (Signed)
Patient called and is needing to speak with you regarding a form that was to be faxed to our office for Dr. Delice Lesch is to fill out. She said they would have faxed it on 08/05/18. Thanks

## 2018-08-06 NOTE — Telephone Encounter (Signed)
Patient is calling in about clonazepam medication refill to be sent to the [facility] [address]. Please send. Thanks!

## 2018-10-08 ENCOUNTER — Ambulatory Visit: Payer: Medicare HMO | Admitting: Psychiatry

## 2018-11-09 ENCOUNTER — Ambulatory Visit: Payer: Medicare HMO | Admitting: Psychiatry

## 2018-11-09 ENCOUNTER — Other Ambulatory Visit: Payer: Self-pay

## 2018-11-09 NOTE — Progress Notes (Signed)
Attempted to reach patient by telephone x 3 for scheduled tele-visit at [phone].  There was no answer.  Left message on first attempt and another message on third attempt recommending that patient contact office to reschedule.

## 2018-11-30 ENCOUNTER — Other Ambulatory Visit: Payer: Self-pay

## 2018-11-30 ENCOUNTER — Encounter: Payer: Self-pay | Admitting: Psychiatry

## 2018-11-30 ENCOUNTER — Ambulatory Visit (INDEPENDENT_AMBULATORY_CARE_PROVIDER_SITE_OTHER): Payer: Medicare HMO | Admitting: Psychiatry

## 2018-11-30 VITALS — Wt 116.0 lb

## 2018-11-30 DIAGNOSIS — F39 Unspecified mood [affective] disorder: Secondary | ICD-10-CM

## 2018-11-30 DIAGNOSIS — F5101 Primary insomnia: Secondary | ICD-10-CM

## 2018-11-30 DIAGNOSIS — F419 Anxiety disorder, unspecified: Secondary | ICD-10-CM | POA: Diagnosis not present

## 2018-11-30 MED ORDER — DOXEPIN HCL 10 MG PO CAPS: ORAL_CAPSULE | ORAL | 1 refills | Status: DC

## 2018-11-30 MED ORDER — CLONIDINE HCL 0.1 MG PO TABS: 0.1000 mg | ORAL_TABLET | Freq: Three times a day (TID) | ORAL | 1 refills | Status: DC

## 2018-11-30 NOTE — Progress Notes (Signed)
[name] [id number] 12/27/1950 68 y.o.  Virtual Visit via Telephone Note  I connected with pt on 11/30/18 at 11:30 AM EDT by telephone and verified that I am speaking with the correct person using two identifiers.   I discussed the limitations, risks, security and privacy concerns of performing an evaluation and management service by telephone and the availability of in person appointments. I also discussed with the patient that there may be a patient responsible charge related to this service. The patient expressed understanding and agreed to proceed.   I discussed the assessment and treatment plan with the patient. The patient was provided an opportunity to ask questions and all were answered. The patient agreed with the plan and demonstrated an understanding of the instructions.   The patient was advised to call back or seek an in-person evaluation if the symptoms worsen or if the condition fails to improve as anticipated.  I provided 30 minutes of non-face-to-face time during this encounter.  The patient was located at home.  The provider was located at home.   Thayer Headings, PMHNP   Subjective:   Patient ID:  Kelli Navarro is a 68 y.o. (DOB 06-May-1951) female.  Chief Complaint:  Chief Complaint  Patient presents with  . Follow-up    h/o mood disturbance, anxiety, insomnia    HPI Kelli Navarro presents for follow-up of depression, anxiety, and insomnia. She reports that she has noticed increased ADD s/s with the quarantine and at times is hyper-focused. She reports that she tries to set goals and make plans to keep her day structured. She reports that she has been cleaning the house and working on multiple projects. She reports that she continues to read 30-60 books a month. Denies any depression. She reports that her energy and motivation have been good. She reports that she falls asleep immediately. She reports that she typically sleeps 4-5 hours a night which is  consistent with her baseline and would sleep 3-4 hours a night when she was younger. Reports that she is unable to nap during the day. Denies SI.   She denies anxiety and reports that keeping herself occupied seems to prevent her from feeling anxious.   Denies any recent impulsive or risky behaviors- "that's what happens when I don't take the Adderall."  Reports intentional weight loss and is now trying to eat protein bars to gain a couple of pounds.   She reports that she feels she no longer needs Rexulti and stopped taking it 4-6 weeks ago and has not noticed any change in her mood or anxiety.   Reports that she has chronic tremor with Topamax and that Clonidine is helpful for tremors.   Review of Systems:  Review of Systems  Musculoskeletal: Negative for gait problem.  Neurological: Positive for tremors.  Psychiatric/Behavioral:       Please refer to HPI    Medications: I have reviewed the patient's current medications.  Current Outpatient Medications  Medication Sig Dispense Refill  . amphetamine-dextroamphetamine (ADDERALL) 20 MG tablet Take 20 mg by mouth 3 (three) times daily.     Marland Kitchen aspirin 81 MG tablet Take 1 tablet (81 mg total) by mouth 2 (two) times daily. (Patient taking differently: Take 81 mg by mouth daily. )    . clonazePAM (KLONOPIN) 2 MG tablet Take half tablet 4 times a day 60 tablet 5  . cloNIDine (CATAPRES) 0.1 MG tablet Take 1 tablet (0.1 mg total) by mouth 3 (three) times daily. 270 tablet 1  .  doxepin (SINEQUAN) 10 MG capsule Take 2 -3 caps po QHS 270 capsule 1  . topiramate (TOPAMAX) 100 MG tablet Take 2 tablets twice a day 120 tablet 11  . albuterol (PROVENTIL HFA;VENTOLIN HFA) 108 (90 BASE) MCG/ACT inhaler Inhale 2 puffs into the lungs every 6 (six) hours as needed for wheezing.     No current facility-administered medications for this visit.     Medication Side Effects: None  Allergies:  Allergies  Allergen Reactions  . Aptiom [Eslicarbazepine]   .  Azithromycin   . Bupropion Other (See Comments)     Caused depression  . Carbamazepine   . Dilantin [Phenytoin Sodium Extended]   . Diphenhydramine Other (See Comments)    Feels like "spiders are crawling" on her  . Divalproex Sodium   . Keppra [Levetiracetam]   . Lamictal [Lamotrigine]   . Lorazepam Swelling    Hallucinations  . Lyrica [Pregabalin] Other (See Comments)    Pt reports feeling loopy and confused  . Morphine And Related     At high doses  . Other     Allergy to steroids causing anaphylaxis    . Prednisone   . Vimpat [Lacosamide]     Past Medical History:  Diagnosis Date  . ADHD   . Chronic back pain   . Depression   . Hypertension   . Right femoral fracture ([place])   . [facility] [place] spotted fever   . Seizures ([place])   . Vitamin B 12 deficiency     Family History  Problem Relation Age of Onset  . Multiple sclerosis Mother   . Heart Problems Mother        by-pass  . Alcohol abuse Mother   . Heart Problems Sister        pacemaker    Social History   Socioeconomic History  . Marital status: Married    Spouse name: Not on file  . Number of children: 2  . Years of education: 25  . Highest education level: Not on file  Occupational History  . 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 Financial resource strain: Not on file  . Food insecurity:    Worry: Not on file    Inability: Not on file  . Transportation needs:    Medical: Not on file    Non-medical: Not on file  Tobacco Use  . Smoking status: Never Smoker  . Smokeless tobacco: Never Used  Substance and Sexual Activity  . Alcohol use: No  . Drug use: No  . Sexual activity: Not on file  Lifestyle  . Physical activity:    Days per week: Not on file    Minutes per session: Not on file  . Stress: Not on file  Relationships  . Social connections:    Talks on phone: Not on file    Gets together: Not on file    Attends religious service: Not on file    Active member of club or organization:  Not on file    Attends meetings of clubs or organizations: Not on file    Relationship status: Not on file  . Intimate partner violence:    Fear of current or ex partner: Not on file    Emotionally abused: Not on file    Physically abused: Not on file    Forced sexual activity: Not on file  Other Topics Concern  . 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   Please see review of systems for further details on the patient's review from today.   Objective:   Physical Exam:  Wt 116 lb (52.6 kg)   BMI 18.44 kg/m   Physical Exam Neurological:     Mental Status: She is alert and oriented to person, place, and time.     Cranial Nerves: No dysarthria.  Psychiatric:        Attention and Perception: Attention normal.        Mood and Affect: Mood normal.        Speech: Speech normal.        Behavior: Behavior is cooperative.        Thought Content: Thought content normal. Thought content is not paranoid or delusional. Thought content does not include homicidal or suicidal ideation. Thought content does not include homicidal or suicidal plan.        Cognition and Memory: Cognition and memory normal.        Judgment: Judgment normal.     Comments: Talkative     Lab Review:     Component Value Date/Time   NA 143 03/24/2017 0314   K 3.5 03/24/2017 0314   CL 113 (H) 03/24/2017 0314   CO2 27 03/24/2017 0314   GLUCOSE 87 03/24/2017 0314   BUN 17 03/24/2017 0314   CREATININE 0.62 03/24/2017 0314   CALCIUM 7.9 (L) 03/24/2017 0314   PROT 6.1 (L) 03/22/2017 0311   ALBUMIN 3.0 (L) 03/22/2017 0311   AST 16 03/22/2017 0311   ALT 11 (L) 03/22/2017 0311   ALKPHOS 82 03/22/2017 0311   BILITOT 1.4 (H) 03/22/2017 0311   GFRNONAA >60  03/24/2017 0314   GFRAA >60 03/24/2017 0314       Component Value Date/Time   WBC 4.1 03/25/2017 0547   RBC 2.98 (L) 03/25/2017 0547   HGB 8.1 (L) 03/25/2017 0547   HCT 25.9 (L) 03/25/2017 0547   PLT 218 03/25/2017 0547   MCV 86.9 03/25/2017 0547   MCH 27.2 03/25/2017 0547   MCHC 31.3 03/25/2017 0547   RDW 14.4 03/25/2017 0547   LYMPHSABS 0.6 (L) 03/21/2017 1108   MONOABS 0.4 03/21/2017 1108   EOSABS 0.0 03/21/2017 1108   BASOSABS 0.0 03/21/2017 1108    No results found for: POCLITH, LITHIUM   No results found for: PHENYTOIN, PHENOBARB, VALPROATE, CBMZ   .res Assessment: Plan:   Continue current plan of care.  Advised pt to contact office if she notices any worsening in mood or anxiety after stopping Rexulti, to kill early since Rexulti has a long half-life and effects of stopping Rexulti may not be evident for several weeks. Continue clonidine 0.1 mg 3 times daily for anxiety.  Patient reports that clonidine is also effective for tremor secondary to Topamax. Continue doxepin 20 to 30 mg p.o. nightly for insomnia. Patient to follow-up in 4 months or sooner if clinically indicated. Patient advised to contact office with any questions, adverse effects, or acute worsening in signs and symptoms.  Anxiety - Stable - Plan: cloNIDine (CATAPRES) 0.1 MG tablet  Primary insomnia - Stable - Plan: doxepin (SINEQUAN) 10 MG capsule  Mood disorder ([place])  Please see After Visit Summary for patient specific instructions.  Future Appointments  Date Time Provider [place]  07/22/2019  3:30 PM Cameron Sprang, MD [facility] None    No orders of the defined types were placed in this encounter.     -------------------------------

## 2019-01-17 ENCOUNTER — Other Ambulatory Visit: Payer: Self-pay | Admitting: Neurology

## 2019-01-31 ENCOUNTER — Other Ambulatory Visit: Payer: Self-pay | Admitting: Neurology

## 2019-02-01 ENCOUNTER — Other Ambulatory Visit: Payer: Self-pay | Admitting: Neurology

## 2019-02-01 ENCOUNTER — Other Ambulatory Visit: Payer: Self-pay

## 2019-02-01 MED ORDER — CLONAZEPAM 2 MG PO TABS: ORAL_TABLET | ORAL | 5 refills | Status: DC

## 2019-02-02 ENCOUNTER — Telehealth: Payer: Self-pay | Admitting: Neurology

## 2019-02-02 NOTE — Telephone Encounter (Signed)
Patient left msg with after hours about needing Klonopin medication refill. She takes 2mg  0.5 tab 4xs a day. Thanks!

## 2019-03-03 ENCOUNTER — Other Ambulatory Visit: Payer: Self-pay | Admitting: Psychiatry

## 2019-03-03 DIAGNOSIS — F5101 Primary insomnia: Secondary | ICD-10-CM

## 2019-03-03 NOTE — Telephone Encounter (Signed)
Has refills on file.

## 2019-03-31 ENCOUNTER — Other Ambulatory Visit: Payer: Self-pay

## 2019-03-31 ENCOUNTER — Encounter: Payer: Self-pay | Admitting: Psychiatry

## 2019-03-31 ENCOUNTER — Ambulatory Visit (INDEPENDENT_AMBULATORY_CARE_PROVIDER_SITE_OTHER): Payer: Medicare HMO | Admitting: Psychiatry

## 2019-03-31 DIAGNOSIS — F5101 Primary insomnia: Secondary | ICD-10-CM

## 2019-03-31 DIAGNOSIS — F419 Anxiety disorder, unspecified: Secondary | ICD-10-CM | POA: Diagnosis not present

## 2019-03-31 MED ORDER — CLONIDINE HCL 0.1 MG PO TABS: 0.1000 mg | ORAL_TABLET | Freq: Three times a day (TID) | ORAL | 1 refills | Status: DC

## 2019-03-31 MED ORDER — DOXEPIN HCL 10 MG PO CAPS: ORAL_CAPSULE | ORAL | 0 refills | Status: DC

## 2019-03-31 NOTE — Progress Notes (Signed)
[name] [id number] [birth year] 68 y.o.  Subjective:   Patient ID:  Kelli Navarro is a 68 y.o. (DOB July 02, 1951) female.  Chief Complaint:  Chief Complaint  Patient presents with  . Follow-up    Anxiety, insomnia    HPI [name] presents to the office today for follow-up of anxiety. She reports that she has been feeling tired and "run down" and attributes this to low WBC count. She reports that low energy causes her anxiety and frustration with not being able to active as she would like. She reports that she has used physical activity to control anxiety and ADD s/s and not being active exacerbates these s/s. She reports periodic panic attacks, typically when she is lost or becomes very jittery. She has occ worry and anxious thoughts. She reports some occ sad mood in response to other factors. Reports that she has been more easily irritated than usual and reports that some things have bothered her that normally would not. Reports that she has had disrupted sleep with difficulty falling and staying asleep. Estimates sleeping about 3 hours a night. Appetite has been stable. She reports poor concentration. Denies SI.   She reports that she has had impulsive thoughts but has not acted on these. Reports long-standing racing thoughts.   She reports that Clonidine is helpful for her tremors and anxiety. She reports that her BP does not change if she takes Clonidine or not.   Reports that she re-tried Rexulti for one month after last visit without any significant improvement. Reports that Doxepin 20 mg has been helpful for her insomnia.   Review of Systems:  Review of Systems  Constitutional: Positive for fatigue.  Musculoskeletal: Negative for gait problem.  Neurological: Negative for dizziness, tremors and light-headedness.  Hematological:       Has been referred to hematology for low WBC count  Psychiatric/Behavioral:       Please refer to HPI    Medications: I have  reviewed the patient's current medications.  Current Outpatient Medications  Medication Sig Dispense Refill  . albuterol (PROVENTIL HFA;VENTOLIN HFA) 108 (90 BASE) MCG/ACT inhaler Inhale 2 puffs into the lungs every 6 (six) hours as needed for wheezing.    Marland Kitchen amphetamine-dextroamphetamine (ADDERALL) 20 MG tablet Take 20 mg by mouth 3 (three) times daily.     . clonazePAM (KLONOPIN) 2 MG tablet Take 1/2 tablet 4 times a day 60 tablet 5  . cloNIDine (CATAPRES) 0.1 MG tablet Take 1 tablet (0.1 mg total) by mouth 3 (three) times daily. 270 tablet 1  . doxepin (SINEQUAN) 10 MG capsule TAKE 2 -3 CAPS BY MOUTH AT BEDTIME 270 capsule 0  . topiramate (TOPAMAX) 100 MG tablet Take 2 tablets twice a day 120 tablet 11  . aspirin 81 MG tablet Take 1 tablet (81 mg total) by mouth 2 (two) times daily. (Patient taking differently: Take 81 mg by mouth daily. )     No current facility-administered medications for this visit.     Medication Side Effects: None  Allergies:  Allergies  Allergen Reactions  . Aptiom [Eslicarbazepine]   . Azithromycin   . Bupropion Other (See Comments)     Caused depression  . Carbamazepine   . Dilantin [Phenytoin Sodium Extended]   . Diphenhydramine Other (See Comments)    Feels like "spiders are crawling" on her  . Divalproex Sodium   . Keppra [Levetiracetam]   . Lamictal [Lamotrigine]   . Lorazepam Swelling    Hallucinations  . Lyrica [Pregabalin] Other (  See Comments)    Pt reports feeling loopy and confused  . Morphine And Related     At high doses  . Other     Allergy to steroids causing anaphylaxis    . Prednisone   . Vimpat [Lacosamide]     Past Medical History:  Diagnosis Date  . ADHD   . Chronic back pain   . Depression   . Hypertension   . Right femoral fracture ([place])   . [facility] spotted fever   . Seizures ([place])   . Vitamin B 12 deficiency     Family History  Problem Relation Age of Onset  . Multiple sclerosis Mother   . Heart Problems  Mother        by-pass  . Alcohol abuse Mother   . Heart Problems Sister        pacemaker    Social History   Socioeconomic History  . Marital status: Married    Spouse name: Not on file  . Number of children: 2  . Years of education: 82  . Highest education level: Not on file  Occupational History  . 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 Financial resource strain: Not on file  . Food insecurity    Worry: Not on file    Inability: Not on file  . Transportation needs    Medical: Not on file    Non-medical: Not on file  Tobacco Use  . Smoking status: Never Smoker  . Smokeless tobacco: Never Used  Substance and Sexual Activity  . Alcohol use: No  . Drug use: No  . Sexual activity: Not on file  Lifestyle  . Physical activity    Days per week: Not on file    Minutes per session: Not on file  . Stress: Not on file  Relationships  . Social Herbalist on phone: Not on file    Gets together: Not on file    Attends religious service: Not on file    Active member of club or organization: Not on file    Attends meetings of clubs or organizations: Not on file    Relationship status: Not on file  . Intimate partner violence    Fear of current or ex partner: Not on file    Emotionally abused: Not on file    Physically abused: Not on file    Forced sexual activity: Not on file  Other Topics Concern  . 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   Please see review of systems for further details on the patient's review from today.   Objective:   Physical Exam:  BP 129/87   Pulse (!) 102   Physical Exam Constitutional:      General: She is not in acute distress.    Appearance: She is well-developed.   Musculoskeletal:        General: No deformity.  Neurological:     Mental Status: She is alert and oriented to person, place, and time.     Coordination: Coordination normal.  Psychiatric:        Attention and Perception: Attention and perception normal. She does not perceive auditory or visual hallucinations.  Mood and Affect: Affect is not labile, blunt, angry or inappropriate.        Speech: Speech normal.        Behavior: Behavior normal.        Thought Content: Thought content normal. Thought content does not include homicidal or suicidal ideation. Thought content does not include homicidal or suicidal plan.        Cognition and Memory: Cognition and memory normal.        Judgment: Judgment normal.     Comments: Pt presents as anxious when discussing medical issues and physical limitations. Insight intact. No delusions.      Lab Review:     Component Value Date/Time   NA 143 03/24/2017 0314   K 3.5 03/24/2017 0314   CL 113 (H) 03/24/2017 0314   CO2 27 03/24/2017 0314   GLUCOSE 87 03/24/2017 0314   BUN 17 03/24/2017 0314   CREATININE 0.62 03/24/2017 0314   CALCIUM 7.9 (L) 03/24/2017 0314   PROT 6.1 (L) 03/22/2017 0311   ALBUMIN 3.0 (L) 03/22/2017 0311   AST 16 03/22/2017 0311   ALT 11 (L) 03/22/2017 0311   ALKPHOS 82 03/22/2017 0311   BILITOT 1.4 (H) 03/22/2017 0311   GFRNONAA >60 03/24/2017 0314   GFRAA >60 03/24/2017 0314       Component Value Date/Time   WBC 4.1 03/25/2017 0547   RBC 2.98 (L) 03/25/2017 0547   HGB 8.1 (L) 03/25/2017 0547   HCT 25.9 (L) 03/25/2017 0547   PLT 218 03/25/2017 0547   MCV 86.9 03/25/2017 0547   MCH 27.2 03/25/2017 0547   MCHC 31.3 03/25/2017 0547   RDW 14.4 03/25/2017 0547   LYMPHSABS 0.6 (L) 03/21/2017 1108   MONOABS 0.4 03/21/2017 1108   EOSABS 0.0 03/21/2017 1108   BASOSABS 0.0 03/21/2017 1108    No results found for: POCLITH, LITHIUM   No results found for: PHENYTOIN, PHENOBARB, VALPROATE, CBMZ    .res Assessment: Plan:   Will continue current medications. Pt reports that Clonidine is helpful for anxiety and tremors and denies orthostatic hypotension.  Continue Doxepin for insomnia. Pt. To f/u in 3 months or sooner if clinically indicated. Patient advised to contact office with any questions, adverse effects, or acute worsening in signs and symptoms.  Meiyah was seen today for follow-up.  Diagnoses and all orders for this visit:  Primary insomnia Comments: Stable Orders: -     doxepin (SINEQUAN) 10 MG capsule; TAKE 2 -3 CAPS BY MOUTH AT BEDTIME  Anxiety Comments: Stable Orders: -     cloNIDine (CATAPRES) 0.1 MG tablet; Take 1 tablet (0.1 mg total) by mouth 3 (three) times daily.     Please see After Visit Summary for patient specific instructions.  Future Appointments  Date Time Provider [place]  07/22/2019  3:30 PM Cameron Sprang, MD [facility] None  08/03/2019 12:45 PM Thayer Headings, PMHNP [facility] None    No orders of the defined types were placed in this encounter.   -------------------------------

## 2019-05-08 HISTORY — PX: OTHER SURGICAL HISTORY: SHX169

## 2019-07-21 ENCOUNTER — Encounter: Payer: Self-pay | Admitting: Neurology

## 2019-07-22 ENCOUNTER — Telehealth (INDEPENDENT_AMBULATORY_CARE_PROVIDER_SITE_OTHER): Payer: Medicare HMO | Admitting: Neurology

## 2019-07-22 ENCOUNTER — Other Ambulatory Visit: Payer: Self-pay

## 2019-07-22 VITALS — Ht 67.0 in | Wt 113.0 lb

## 2019-07-22 DIAGNOSIS — R569 Unspecified convulsions: Secondary | ICD-10-CM | POA: Diagnosis not present

## 2019-07-22 DIAGNOSIS — G2581 Restless legs syndrome: Secondary | ICD-10-CM | POA: Diagnosis not present

## 2019-07-22 MED ORDER — TOPIRAMATE 100 MG PO TABS: ORAL_TABLET | ORAL | 11 refills | Status: DC

## 2019-07-22 MED ORDER — CLONAZEPAM 2 MG PO TABS: ORAL_TABLET | ORAL | 5 refills | Status: DC

## 2019-07-22 NOTE — Progress Notes (Signed)
Telephone (Audio) Visit The purpose of this telephone visit is to provide medical care while limiting exposure to the novel coronavirus.    Consent was obtained for telephone visit:  Yes.   Answered questions that patient had about telehealth interaction:  Yes.   I discussed the limitations, risks, security and privacy concerns of performing an evaluation and management service by telephone. I also discussed with the patient that there may be a patient responsible charge related to this service. The patient expressed understanding and agreed to proceed.  Pt location: Home Physician Location: office Name of referring provider:  Rubie Maid, MD I connected with .Kelli Navarro at patients initiation/request on 07/22/2019 at  3:30 PM EST by telephone and verified that I am speaking with the correct person using two identifiers.  Pt MRN:  [id number] Pt DOB:  [birth year]  History of Present Illness:  The patient had a telephone visit on 07/22/2019. She was last seen in the neurology clinic a year ago for seizures. She continues to do well seizure-free since January 2019. She is on Topiramate '200mg'$  BID and clonazepam '1mg'$  four times a day. Due to insurance issues, her clonazepam was changed to '2mg'$  1/2 tab four times a day (instead of '1mg'$  tab). Clonazepam was initially started by her prior neurologist for RLS then dose was increased for seizures. It also helps with her anxiety. 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 Her RLS is not bothering her as long as she takes the clonazepam. She reports having a lot of anxiety and stress currently. She has clonidine prescribed by her psychiatrist, which she feels helps with the  "shakes from the Topamax." She had a fall in the Spring where she broke her foot and wrist.   History on Initial Assessment 07/08/2017: This is a 69 yo RH woman with a history of hypertension, chronic back pain, seizures, restless leg syndrome, and ADHD, presenting to establish care for her seizures. Records from her prior neurologist Dr. [name] from [facility] were reviewed. It appears she was mostly being followed for back pain. The patient reports she was being prescribed narcotics and up to '400mg'$  BID of Topamax daily, which caused her to be dizzy. She weaned herself off narcotic medication. She reports that her seizures started around 15 years ago when she had fevers and weight loss. 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 She states the CDC contacted her telling her "it would never be gone, and can affect different parts of my body, prominently her head." She was given antibiotics and the fevers went down, but she continued to feel unwell. She kept having episodes of loss of consciousness. She would talk the dogs out for a walk or be changing a lightbulb and pass out, falling off a ladder one time. Her wife has only witnessed one GTC many years ago, she was sitting on the chair then had a blank stare, unresponsive, then started shaking, with her head turned to one side. This lasted a couple of minutes, no tongue bite or incontinence. She reports that over time, she would go years with no  daytime seizures, but continues to have nocturnal seizures occurring around 3am, she would wake up on the floor in between the desk and bed. Sometimes she was up feeling unwell. Her wife sleeps in a different room and has not witnessed the nocturnal seizures, but can tell when she has had one. Her wife reports another episode where she heard a fall last 03/21/17, she was found to have a displaced femoral  neck fracture, as well as a right foot drop with fracture of the 5th metatarsal. She has been told in the past that her seizures are due to a combination of stress and pain. She has injured herself several times due to the seizures, she has had a lumbar fracture, rib fractures, fractured both feet, broke her nose falling down a flight of stairs. She has also tripped from being weak and wobbly after a seizure. She occasionally has a feeling where her head feels unclear, she can talk, denies any olfactory/gustatory hallucinations, rising epigastric sensation, focal numbness/tingling. She reports that she does not have daytime seizures because she can "block it out better," but at night she "can't control her subconscious" and has the night time seizures more. Her wife notes that when she is focused on something, she is "in a mode" and would not respond initially, then says she is okay.   She has bilateral hand tremors and sometimes has difficulty reading her handwriting. She is scheduled for nerve ablation in her back on the 7th. They report she has a lot of problems when she is stressed and her back is hurting, she would be overwhelmed, screaming out, more depressed, and have more seizures. She has had migraines her "whole life," which she treats with biofeedback. She reports the last migraines was triggered by an unrecalled medication from her psychiatrist, this occurred a week ago. She has migraines twice a week lasting a couple of days, with associated nausea/vomiting, photosensitivity. Her balance is not good, she has to hold on to furniture. She has difficulties bending down, when she bends for the faucet, she feels disoriented (not spinning). She takes clonazepam '1mg'$  4 times a day, initially she was taking it for RLS, then dose was increased for the seizures. Per records, she has tried several AEDs in the past, including Topamax, Tegretol (depression), Dilantin (rash), Depakote (depression), Aptiom, Ativan  (swelling, hallucinations), Lamictal (headache), Vimpat (dizziness), Lyrica (edema), and Keppra (depression). She has had a 24-hour EEG reported as normal. She also reports having a home EEG that was on for a week(?) which was also normal. This was in her 23s. She is in Abilify, Doxepin, and Adderall prescribed by her psychiatrist.   Epilepsy Risk Factors:  She had a normal birth and early development.  There is no history of febrile convulsions, CNS infections such as meningitis/encephalitis, significant traumatic brain injury, neurosurgical procedures, or family history of seizures.  Prior AEDs: Topamax, Tegretol (depression), Dilantin (rash), Depakote (depression), Aptiom, Ativan (swelling, hallucinations), Lamictal (headache), Vimpat (dizziness), Lyrica (edema), and Keppra (depression)   MEDICATIONS:  Outpatient Encounter Medications as of 07/22/2019  Medication Sig  . amphetamine-dextroamphetamine (ADDERALL) 20 MG tablet Take 20 mg by mouth 3 (three) times daily.   Marland Kitchen aspirin 81 MG tablet Take 1 tablet (81 mg total) by mouth 2 (two) times daily. (Patient taking differently: Take 81 mg by mouth daily. )  . clonazePAM (KLONOPIN) 2 MG tablet Take 1/2 tablet 4 times a day  . cloNIDine (CATAPRES) 0.1 MG tablet Take 1 tablet (0.1 mg total) by mouth  3 (three) times daily.  Marland Kitchen doxepin (SINEQUAN) 10 MG capsule TAKE 2 -3 CAPS BY MOUTH AT BEDTIME  . topiramate (TOPAMAX) 100 MG tablet Take 2 tablets twice a day  . [DISCONTINUED] albuterol (PROVENTIL HFA;VENTOLIN HFA) 108 (90 BASE) MCG/ACT inhaler Inhale 2 puffs into the lungs every 6 (six) hours as needed for wheezing.   No facility-administered encounter medications on file as of 07/22/2019.    Observations/Objective:   Vitals:   07/21/19 1443  Weight: 113 lb (51.3 kg)  Height: '5\' 7"'$  (1.702 m)   Exam is limited due to nature of telephone visit. Patient is awake, alert, able to answer questions without dysarthria or confusion  Assessment and Plan:    This is a 69 yo RH woman with a history of hypertension, chronic back pain, restless leg syndrome, ADHD, and seizures. 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 However, she has nocturnal episodes where she has fallen off the bed and injured herself. She has not had any nocturnal seizures since January 2019. She has not had any daytime seizures in many years. Continue Topiramate '200mg'$  BID. She is on clonazepam '1mg'$  four times a day (taken as '2mg'$  tabs 1/2 tab four times a day) for RLS, seizures, and anxiety. We again discussed weaning down clonazepam, she endorses a lot of stress with potential plans for chemotherapy and would like to wait for now until she feels there is a clearer plan. Continue follow-up with Psychiatry. She is aware of [place] driving laws to stop driving until 6 months seizure-free. Follow-up in 1 year, she knows to call for any changes.    Follow Up Instructions:   -I discussed the assessment and treatment plan with the patient. The patient was provided an opportunity to ask questions and all were answered. The patient agreed with the plan and demonstrated an understanding of the instructions.   The patient was advised to call back or seek an in-person evaluation if the symptoms worsen or if the condition fails to improve as anticipated.   Total Time spent in visit with the patient was:  14:03 minutes, of which 100% of the time was spent in counseling and/or coordinating care on the above.   Pt understands and agrees with the plan of care outlined.     Cameron Sprang, MD

## 2019-07-29 ENCOUNTER — Other Ambulatory Visit: Payer: Self-pay | Admitting: Psychiatry

## 2019-07-29 DIAGNOSIS — F5101 Primary insomnia: Secondary | ICD-10-CM

## 2019-08-03 ENCOUNTER — Ambulatory Visit (INDEPENDENT_AMBULATORY_CARE_PROVIDER_SITE_OTHER): Payer: Medicare HMO | Admitting: Psychiatry

## 2019-08-03 ENCOUNTER — Encounter: Payer: Self-pay | Admitting: Psychiatry

## 2019-08-03 ENCOUNTER — Other Ambulatory Visit: Payer: Self-pay

## 2019-08-03 DIAGNOSIS — F5101 Primary insomnia: Secondary | ICD-10-CM

## 2019-08-03 DIAGNOSIS — F419 Anxiety disorder, unspecified: Secondary | ICD-10-CM | POA: Diagnosis not present

## 2019-08-03 MED ORDER — DOXEPIN HCL 10 MG PO CAPS: ORAL_CAPSULE | ORAL | 0 refills | Status: DC

## 2019-08-03 MED ORDER — CLONIDINE HCL 0.1 MG PO TABS: 0.1000 mg | ORAL_TABLET | Freq: Three times a day (TID) | ORAL | 1 refills | Status: DC

## 2019-08-03 NOTE — Progress Notes (Signed)
[name] RO:[id number] 01-Mar-1951 69 y.o.  Subjective:   Patient ID:  Kelli Navarro is a 69 y.o. (DOB 11/22/1950) female.  Chief Complaint:  Chief Complaint  Patient presents with  . Follow-up    h/o anxiety and insomnia    HPI Kelli Navarro presents to the office today for follow-up of anxiety, insomnia and h/o mood disturbance. She reports that she and her wife have been staying home more to minimize risk of exposure. She has been cleaning since the pandemic. Has been cleaning out her house and closets. "Some depression and a lot of anxiety." Denies significant depression. Describes anxiety as, "I just can't sit still." Reports that she often fidgets in chairs. Reports occ physical s/s with anxiety if she does not keep herself busy. Denies excessive worry. She reports that she is sleeping well. Sleeps about 4 hours a night which is consistent with her baseline. Appetite has been fine. She reports, "I don't have a lot of energy. I am just pushing myself." She reports that concentration is better when she is working on a task or project. Denies SI.   Denies impulsive or risky behavior.     Review of Systems:  Review of Systems  Musculoskeletal: Negative for gait problem.  Neurological: Negative for dizziness.       Reports that Clonidine is helpful with controlling tremors that she attributes to Topamax.   Hematological:       Reports that she has been seeing hematology for low WBC count  Psychiatric/Behavioral:       Please refer to HPI   Reports that she feels physically better with vegan diet. Prefers to eat smaller, more frequent meals.   Medications: I have reviewed the patient's current medications.  Current Outpatient Medications  Medication Sig Dispense Refill  . amphetamine-dextroamphetamine (ADDERALL) 20 MG tablet Take 20 mg by mouth 3 (three) times daily.     Marland Kitchen aspirin 81 MG tablet Take 1 tablet (81 mg total) by mouth 2 (two) times daily. (Patient taking  differently: Take 81 mg by mouth daily. )    . clonazePAM (KLONOPIN) 2 MG tablet Take 1/2 tablet 4 times a day 60 tablet 5  . cloNIDine (CATAPRES) 0.1 MG tablet Take 1 tablet (0.1 mg total) by mouth 3 (three) times daily. 270 tablet 1  . doxepin (SINEQUAN) 10 MG capsule TAKE 2 -3 CAPS BY MOUTH AT BEDTIME 270 capsule 0  . topiramate (TOPAMAX) 100 MG tablet Take 2 tablets twice a day 120 tablet 11   No current facility-administered medications for this visit.    Medication Side Effects: None  Allergies:  Allergies  Allergen Reactions  . Aptiom [Eslicarbazepine]   . Azithromycin   . Bupropion Other (See Comments)     Caused depression  . Carbamazepine   . Dilantin [Phenytoin Sodium Extended]   . Diphenhydramine Other (See Comments)    Feels like "spiders are crawling" on her  . Divalproex Sodium   . Keppra [Levetiracetam]   . Lamictal [Lamotrigine]   . Lorazepam Swelling    Hallucinations  . Lyrica [Pregabalin] Other (See Comments)    Pt reports feeling loopy and confused  . Morphine And Related     At high doses  . Other     Allergy to steroids causing anaphylaxis    . Prednisone   . Vimpat [Lacosamide]     Past Medical History:  Diagnosis Date  . ADHD   . Chronic back pain   . Depression   . Hypertension   .  Right femoral fracture ([place])   . [facility] spotted fever   . Seizures ([place])   . Vitamin B 12 deficiency     Family History  Problem Relation Age of Onset  . Multiple sclerosis Mother   . Heart Problems Mother        by-pass  . Alcohol abuse Mother   . Heart Problems Sister        pacemaker    Social History   Socioeconomic History  . Marital status: Married    Spouse name: Not on file  . Number of children: 2  . Years of education: 54  . Highest education level: Not on file  Occupational History  . Occupation: retired  Tobacco Use  . Smoking status: Never Smoker  . Smokeless tobacco: Never Used  Substance and Sexual Activity  . Alcohol  use: No  . Drug use: No  . Sexual activity: Not on file  Other Topics Concern  . 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 Difficulty of Paying Living Expenses: Not on file  Food Insecurity:   . Worried About Charity fundraiser in the Last Year: Not on file  . Ran Out of Food in the Last Year: Not on file  Transportation Needs:   . Lack of Transportation (Medical): Not on file  . Lack of Transportation (Non-Medical): Not on file  Physical Activity:   . Days of Exercise per Week: Not on file  . Minutes of Exercise per Session: Not on file  Stress:   . Feeling of Stress : Not on file  Social Connections:   . Frequency of Communication with Friends and Family: Not on file  . Frequency of Social Gatherings with Friends and Family: Not on file  . Attends Religious Services: Not on file  . Active Member of Clubs or Organizations: Not on file  . Attends Archivist Meetings: Not on file  . Marital Status: Not on file  Intimate Partner Violence:   . Fear of Current or Ex-Partner: Not on file  . Emotionally Abused: Not on file  . Physically Abused: Not on file  . Sexually Abused: Not on file    Past Medical History, Surgical history, Social history, and Family history were reviewed and updated as appropriate.   Please see review of systems for further details on the patient's review from today.   Objective:   Physical Exam:  BP 113/69   Pulse 80   Wt 113 lb (51.3 kg)   BMI 17.70 kg/m   Physical Exam Neurological:     Mental Status: She is alert and oriented to person, place, and time.     Cranial Nerves: No dysarthria.  Psychiatric:        Attention and Perception: Attention and perception normal.        Mood and Affect: Mood is anxious.        Speech: Speech  normal.        Behavior: Behavior is cooperative.        Thought Content: Thought content normal. Thought content is not paranoid or delusional. Thought content does not include homicidal or suicidal ideation. Thought content does not include homicidal or suicidal plan.  Cognition and Memory: Cognition and memory normal.        Judgment: Judgment normal.     Comments: Insight intact     Lab Review:     Component Value Date/Time   NA 143 03/24/2017 0314   K 3.5 03/24/2017 0314   CL 113 (H) 03/24/2017 0314   CO2 27 03/24/2017 0314   GLUCOSE 87 03/24/2017 0314   BUN 17 03/24/2017 0314   CREATININE 0.62 03/24/2017 0314   CALCIUM 7.9 (L) 03/24/2017 0314   PROT 6.1 (L) 03/22/2017 0311   ALBUMIN 3.0 (L) 03/22/2017 0311   AST 16 03/22/2017 0311   ALT 11 (L) 03/22/2017 0311   ALKPHOS 82 03/22/2017 0311   BILITOT 1.4 (H) 03/22/2017 0311   GFRNONAA >60 03/24/2017 0314   GFRAA >60 03/24/2017 0314       Component Value Date/Time   WBC 4.1 03/25/2017 0547   RBC 2.98 (L) 03/25/2017 0547   HGB 8.1 (L) 03/25/2017 0547   HCT 25.9 (L) 03/25/2017 0547   PLT 218 03/25/2017 0547   MCV 86.9 03/25/2017 0547   MCH 27.2 03/25/2017 0547   MCHC 31.3 03/25/2017 0547   RDW 14.4 03/25/2017 0547   LYMPHSABS 0.6 (L) 03/21/2017 1108   MONOABS 0.4 03/21/2017 1108   EOSABS 0.0 03/21/2017 1108   BASOSABS 0.0 03/21/2017 1108    No results found for: POCLITH, LITHIUM   No results found for: PHENYTOIN, PHENOBARB, VALPROATE, CBMZ   .res Assessment: Plan:   Will continue current plan of care since target signs and symptoms are well controlled without any tolerability issues. Continue clonidine 0.1 mg 3 times daily for anxiety. Continue doxepin 10 mg 2-3 tabs p.o. nightly for insomnia. Patient to follow-up in 4 months or sooner if clinically indicated. Patient advised to contact office with any questions, adverse effects, or acute worsening in signs and symptoms.  Drisana was seen today for  follow-up.  Diagnoses and all orders for this visit:  Anxiety Comments: Stable Orders: -     cloNIDine (CATAPRES) 0.1 MG tablet; Take 1 tablet (0.1 mg total) by mouth 3 (three) times daily.  Primary insomnia Comments: Stable Orders: -     doxepin (SINEQUAN) 10 MG capsule; TAKE 2 -3 CAPS BY MOUTH AT BEDTIME     Please see After Visit Summary for patient specific instructions.  Future Appointments  Date Time Provider [place]  12/01/2019  1:00 PM Thayer Headings, PMHNP [facility] None    No orders of the defined types were placed in this encounter.   -------------------------------

## 2019-12-01 ENCOUNTER — Encounter: Payer: Self-pay | Admitting: Psychiatry

## 2019-12-01 ENCOUNTER — Ambulatory Visit (INDEPENDENT_AMBULATORY_CARE_PROVIDER_SITE_OTHER): Payer: Medicare HMO | Admitting: Psychiatry

## 2019-12-01 VITALS — BP 121/74 | HR 93 | Wt 112.0 lb

## 2019-12-01 DIAGNOSIS — F419 Anxiety disorder, unspecified: Secondary | ICD-10-CM | POA: Diagnosis not present

## 2019-12-01 DIAGNOSIS — F5101 Primary insomnia: Secondary | ICD-10-CM

## 2019-12-01 DIAGNOSIS — F39 Unspecified mood [affective] disorder: Secondary | ICD-10-CM

## 2019-12-01 MED ORDER — OLANZAPINE 5 MG PO TABS: 5.0000 mg | ORAL_TABLET | Freq: Every day | ORAL | 1 refills | Status: DC

## 2019-12-01 MED ORDER — DOXEPIN HCL 10 MG PO CAPS: ORAL_CAPSULE | ORAL | 0 refills | Status: DC

## 2019-12-01 MED ORDER — CLONIDINE HCL 0.1 MG PO TABS: 0.1000 mg | ORAL_TABLET | Freq: Three times a day (TID) | ORAL | 0 refills | Status: DC

## 2019-12-01 NOTE — Progress Notes (Signed)
Kelli Navarro RO:[id number] 02/25/1951 69 y.o.  Subjective:   Patient ID:  Kelli Navarro is a 69 y.o. (DOB [birth year]) female.  Chief Complaint:  Chief Complaint  Patient presents with  . Anxiety  . Insomnia    HPI Kelli Navarro presents to the office today for follow-up of anxiety and insomnia.  Patient reports that she has been having increased anxiety in response to medical issues and uncertainty about treatment.  She reports that she had significant worry for several months leading up to medical appointment when she plans to tell medical provider that she did not want to proceed with a certain type of treatment.  She reports "the only way I can cope with stress is heavy labor."  She reports that recently she feels as if she is unable to do enough activity to adequately relieve her anxiety.  She reports that her blood pressure has likely been elevated at times due to anxiety.  She reports that she has been having difficulty sitting still and has not been able to read as much.  She reports poor sleep and reports that sleeping 4 hours is "a good night."  She reports increased energy and increased goal-directed activity.  Reports that her mood has been depressed and irritable.  She reports that her weight has fluctuated some and returned to baseline.  She reports occasional fleeting suicidal thoughts.  Denies SI at time of exam.  Contracts for safety.    Review of Systems:  Review of Systems  Musculoskeletal: Positive for arthralgias. Negative for gait problem.  Neurological: Negative for tremors.  Psychiatric/Behavioral:       Please refer to HPI    Medications: I have reviewed the patient's current medications.  Current Outpatient Medications  Medication Sig Dispense Refill  . amphetamine-dextroamphetamine (ADDERALL) 20 MG tablet Take 20 mg by mouth 3 (three) times daily.     Marland Kitchen aspirin 81 MG tablet Take 1 tablet (81 mg total) by mouth 2 (two) times daily. (Patient taking  differently: Take 81 mg by mouth daily. )    . clonazePAM (KLONOPIN) 2 MG tablet Take 1/2 tablet 4 times a day 60 tablet 5  . cloNIDine (CATAPRES) 0.1 MG tablet Take 1 tablet (0.1 mg total) by mouth 3 (three) times daily. 270 tablet 0  . doxepin (SINEQUAN) 10 MG capsule TAKE 2 -3 CAPS BY MOUTH AT BEDTIME 270 capsule 0  . OLANZapine (ZYPREXA) 5 MG tablet Take 1 tablet (5 mg total) by mouth at bedtime. 30 tablet 1  . topiramate (TOPAMAX) 100 MG tablet Take 2 tablets twice a day 120 tablet 11   No current facility-administered medications for this visit.    Medication Side Effects: None  Allergies:  Allergies  Allergen Reactions  . Aptiom [Eslicarbazepine]   . Azithromycin   . Bupropion Other (See Comments)     Caused depression  . Carbamazepine   . Dilantin [Phenytoin Sodium Extended]   . Diphenhydramine Other (See Comments)    Feels like "spiders are crawling" on her  . Divalproex Sodium   . Keppra [Levetiracetam]   . Lamictal [Lamotrigine]   . Lorazepam Swelling    Hallucinations  . Lyrica [Pregabalin] Other (See Comments)    Pt reports feeling loopy and confused  . Morphine And Related     At high doses  . Other     Allergy to steroids causing anaphylaxis    . Prednisone   . Vimpat [Lacosamide]     Past Medical History:  Diagnosis Date  .  ADHD   . Chronic back pain   . Depression   . Hypertension   . Right femoral fracture ([place])   . [facility] spotted fever   . Seizures ([place])   . Vitamin B 12 deficiency     Family History  Problem Relation Age of Onset  . Multiple sclerosis Mother   . Heart Problems Mother        by-pass  . Alcohol abuse Mother   . Heart Problems Sister        pacemaker    Social History   Socioeconomic History  . Marital status: Married    Spouse name: Not on file  . Number of children: 2  . Years of education: 64  . Highest education level: Not on file  Occupational History  . Occupation: retired  Tobacco Use  . Smoking  status: Never Smoker  . Smokeless tobacco: Never Used  Substance and Sexual Activity  . Alcohol use: No  . Drug use: No  . Sexual activity: Not on file  Other Topics Concern  . 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 Difficulty of Paying Living Expenses:   Food Insecurity:   . Worried About Charity fundraiser in the Last Year:   . Arboriculturist in the Last Year:   Transportation Needs:   . Film/video editor (Medical):   Marland Kitchen Lack of Transportation (Non-Medical):   Physical Activity:   . Days of Exercise per Week:   . Minutes of Exercise per Session:   Stress:   . Feeling of Stress :   Social Connections:   . Frequency of Communication with Friends and Family:   . Frequency of Social Gatherings with Friends and Family:   . Attends Religious Services:   . Active Member of Clubs or Organizations:   . Attends Archivist Meetings:   Marland Kitchen Marital Status:   Intimate Partner Violence:   . Fear of Current or Ex-Partner:   . Emotionally Abused:   Marland Kitchen Physically Abused:   . Sexually Abused:     Past Medical History, Surgical history, Social history, and Family history were reviewed and updated as appropriate.   Please see review of systems for further details on the patient's review from today.   Objective:   Physical Exam:  BP 121/74   Pulse 93   Wt 112 lb (50.8 kg)   BMI 17.54 kg/m   Physical Exam Constitutional:      General: She is not in acute distress. Musculoskeletal:        General: No deformity.  Neurological:     Mental Status: She is alert and oriented to person, place, and time.     Coordination: Coordination normal.  Psychiatric:        Attention and Perception: Attention and perception normal. She does not perceive auditory or visual  hallucinations.        Mood and Affect: Mood is anxious and depressed. Affect is tearful. Affect is not labile, blunt, angry or inappropriate.        Speech: Speech normal.        Behavior: Behavior normal. Behavior is cooperative.        Thought Content: Thought content  normal. Thought content is not paranoid or delusional. Thought content does not include homicidal or suicidal ideation. Thought content does not include homicidal or suicidal plan.        Cognition and Memory: Cognition and memory normal.        Judgment: Judgment normal.     Comments: Insight intact     Lab Review:     Component Value Date/Time   NA 143 03/24/2017 0314   K 3.5 03/24/2017 0314   CL 113 (H) 03/24/2017 0314   CO2 27 03/24/2017 0314   GLUCOSE 87 03/24/2017 0314   BUN 17 03/24/2017 0314   CREATININE 0.62 03/24/2017 0314   CALCIUM 7.9 (L) 03/24/2017 0314   PROT 6.1 (L) 03/22/2017 0311   ALBUMIN 3.0 (L) 03/22/2017 0311   AST 16 03/22/2017 0311   ALT 11 (L) 03/22/2017 0311   ALKPHOS 82 03/22/2017 0311   BILITOT 1.4 (H) 03/22/2017 0311   GFRNONAA >60 03/24/2017 0314   GFRAA >60 03/24/2017 0314       Component Value Date/Time   WBC 4.1 03/25/2017 0547   RBC 2.98 (L) 03/25/2017 0547   HGB 8.1 (L) 03/25/2017 0547   HCT 25.9 (L) 03/25/2017 0547   PLT 218 03/25/2017 0547   MCV 86.9 03/25/2017 0547   MCH 27.2 03/25/2017 0547   MCHC 31.3 03/25/2017 0547   RDW 14.4 03/25/2017 0547   LYMPHSABS 0.6 (L) 03/21/2017 1108   MONOABS 0.4 03/21/2017 1108   EOSABS 0.0 03/21/2017 1108   BASOSABS 0.0 03/21/2017 1108    No results found for: POCLITH, LITHIUM   No results found for: PHENYTOIN, PHENOBARB, VALPROATE, CBMZ   .res Assessment: Plan:   Rahima was seen today for anxiety and insomnia.  Diagnoses and all orders for this visit:  Anxiety -     OLANZapine (ZYPREXA) 5 MG tablet; Take 1 tablet (5 mg total) by mouth at bedtime. -     cloNIDine (CATAPRES) 0.1 MG tablet; Take 1 tablet (0.1 mg total)  by mouth 3 (three) times daily.  Primary insomnia -     OLANZapine (ZYPREXA) 5 MG tablet; Take 1 tablet (5 mg total) by mouth at bedtime. -     doxepin (SINEQUAN) 10 MG capsule; TAKE 2 -3 CAPS BY MOUTH AT BEDTIME  Mood disorder (HCC) -     OLANZapine (ZYPREXA) 5 MG tablet; Take 1 tablet (5 mg total) by mouth at bedtime.  Anxiety Comments: acute exacerbation Orders: -     OLANZapine (ZYPREXA) 5 MG tablet; Take 1 tablet (5 mg total) by mouth at bedtime. -     cloNIDine (CATAPRES) 0.1 MG tablet; Take 1 tablet (0.1 mg total) by mouth 3 (three) times daily.  Primary insomnia Comments: acute exacerbation Orders: -     OLANZapine (ZYPREXA) 5 MG tablet; Take 1 tablet (5 mg total) by mouth at bedtime. -     doxepin (SINEQUAN) 10 MG capsule; TAKE 2 -3 CAPS BY MOUTH AT BEDTIME    Discussed potential metabolic side effects associated with atypical antipsychotics, as well as potential risk for movement side effects. Advised pt to contact office if movement side effects occur.  Discussed potential benefits, risks, and side effects of olanzapine and discussed that this may be helpful for her mood, anxiety, and insomnia.  Patient agrees to trial of olanzapine. Will continue doxepin for insomnia and clonidine for anxiety. Discussed safety plan with patient.  Advised patient to contact office with any worsening signs and symptoms.  Instructed patient to go  to the emergency room for evaluation or call 911 if experiencing any acute safety concerns, to include suicidal intent. Patient advised to contact office with any questions, adverse effects, or acute worsening in signs and symptoms.    Please see After Visit Summary for patient specific instructions.  Future Appointments  Date Time Provider [place]  08/06/2020  3:00 PM Cameron Sprang, MD [facility] None    No orders of the defined types were placed in this encounter.   -------------------------------

## 2019-12-23 ENCOUNTER — Telehealth: Payer: Self-pay | Admitting: Psychiatry

## 2019-12-23 DIAGNOSIS — F5101 Primary insomnia: Secondary | ICD-10-CM

## 2019-12-23 DIAGNOSIS — F39 Unspecified mood [affective] disorder: Secondary | ICD-10-CM

## 2019-12-23 MED ORDER — QUETIAPINE FUMARATE 25 MG PO TABS: ORAL_TABLET | ORAL | 0 refills | Status: DC

## 2019-12-23 NOTE — Telephone Encounter (Signed)
Informed patient of medication and to call back next week with an update.

## 2019-12-23 NOTE — Telephone Encounter (Signed)
Pt tried Olanzapine at last apt.for sleep Reporting did not help. Had opposite effects. Any other med she could try? Pt ask if she needs to come in this early for follow up since Olanzapine did not work?

## 2019-12-27 ENCOUNTER — Telehealth: Payer: Self-pay | Admitting: Hematology

## 2019-12-27 NOTE — Telephone Encounter (Signed)
Received a new hem referral from [place] for thrombocytosis and neutropenia. Kelli Navarro has been cld and scheduled to see Dr. Irene Limbo on 7/1 at 2:40pm Pt aware to arrive 15 minutes early.

## 2020-01-03 ENCOUNTER — Telehealth: Payer: Self-pay | Admitting: Neurology

## 2020-01-03 NOTE — Telephone Encounter (Signed)
Pt states MRI CD was dropped off for Dr Delice Lesch to see. She saw a spine surgeon who says she has old compression fx causing her back pain but she has some left leg nerve damage that needs to be addressed. She says if this cannot be done by Dr Delice Lesch then he will give her a referral to another physician who specializes in this. Will defer to Dr Delice Lesch and f/u with pt.

## 2020-01-03 NOTE — Telephone Encounter (Signed)
Patient's family member came in and brought a CD and some paperwork for Dr. Delice Lesch to take a look at. The patient fell and had some nerve damage due to the fall. The patient has an appointment already for 08/06/20. They would like some advice on what they should do in the mean time or if she can be seen earlier.

## 2020-01-04 ENCOUNTER — Telehealth: Payer: Self-pay

## 2020-01-04 NOTE — Telephone Encounter (Signed)
Nothing other than the MRI that I was made aware of.

## 2020-01-04 NOTE — Telephone Encounter (Signed)
What symptoms is she having from the nerve damage? From a neurological standpoint, we can use medications such as gabapentin, but we do not do injections or any other treatments in our office. If she has tried medications, I would suggest referral to Physical Medicine and Rehab for radiculopathy. Thanks

## 2020-01-04 NOTE — Telephone Encounter (Signed)
Pt called to state she is going to get a referral to a specialist as suggested by her spinal surgeon.

## 2020-01-04 NOTE — Telephone Encounter (Signed)
Do you want this pt referred on to a different specialist for her reported nerve damage?

## 2020-01-04 NOTE — Progress Notes (Signed)
HEMATOLOGY/ONCOLOGY CONSULTATION NOTE  Date of Service: 01/05/2020  Patient Care Team: Rubie Maid, MD as PCP - General (Family Medicine)  REFERRING PHYSICIAN: Rubie Maid, MD  CHIEF COMPLAINTS/PURPOSE OF CONSULTATION:  Neutropenia/Thrombocytosis    HISTORY OF PRESENTING ILLNESS:   Kelli Navarro is a wonderful 69 y.o. female who has been referred to [place] by Rubie Maid, MD for evaluation and management of neutropenia/thrombocytosis. Pt is accompanied today by her spouse. The pt reports that she is doing well overall.   The pt reports she is good. Pt took a 18 foot fall from a tree and she is doing much better. Pt was placed on Gabapentin for the fall. 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 The [place] spotted fever was found 18 years ago. She had blisters in mouth, couldn't swallow, had high fevers, sudden weight loss in 1 month and cause changes in blood counts. The first time her blood counts were low was found 18 years ago with the [place] spotted fever.   Her blood counts are always low and fluctuate since the [place] spotted fever.  She never had low blood counts previously to the [place] spotted fever.  Her RBC usually are just below normal. She can usually tell when her counts are low because she doesn't heal well and has fatigue. Pt has had no internal infections. She has gotten her pneumonia, influenza, COVID19, and Shingrix vaccines. Pt has mouth soars often. In the last year she has not felt any different and has not had many changes.    Pt currently takes hair, skin and nails supplement, multivitamin, and vitamin B12 supplements.  Pt use to have epileptic episodes where she had seizures 3 times a day up to about two years ago. The episodes are now under control. She has ADHD and takes medication for it and exercises. Previously she had Iron deficiency and was given shots in 2018.   Most recent lab results (10/18/19)  of CBC is as follows: all values are WNL except for WBC at 3.9K, MCHC at 32.2, MPV at 6.7   On review of systems, pt reports mouth soars, episodes of fatigue, steady weight, abscesses and denies joint pain, edema, skin rashes, diarrhea, dry mouth, dry eyes, abdominal pain,  and any other symptoms.   On PMHx the pt reports ADHD, epileptic, [facility] Spotted Fever, Vitamin B12 deficiency in 2018, allergy to prednisone, iron deficiency in 2018, no liver disease  On Social Hx the pt reports she was vegan, now eats some salmon, no alcohol, no other chemical use   On Family Hx the pt reports no history of thyroid disease   MEDICAL HISTORY:  Past Medical History:  Diagnosis Date  . ADHD   . Chronic back pain   . Depression   . Hypertension   . Right femoral fracture ([place])   . [facility] spotted fever   . Seizures ([place])   . Vitamin B 12 deficiency      SURGICAL HISTORY: Past Surgical History:  Procedure Laterality Date  . BACK SURGERY    . none    . radial frequency ablasion  05/2019   back  . 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 Marital status: Married    Spouse name: Not on file  .  Number of children: 2  . Years of education: 79  . Highest education level: Not on file  Occupational History  . Occupation: retired  Tobacco Use  . Smoking status: Never Smoker  . Smokeless tobacco: Never Used  Vaping Use  . Vaping Use: Never used  Substance and Sexual Activity  . Alcohol use: No  . Drug use: No  . Sexual activity: Not on file  Other Topics Concern  . 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 Difficulty of Paying Living Expenses:   Food Insecurity:   . Worried About Charity fundraiser in the Last Year:   . Arboriculturist in the Last Year:   Transportation Needs:   . Film/video editor (Medical):   Marland Kitchen Lack of Transportation (Non-Medical):   Physical Activity:   . Days of Exercise per Week:   . Minutes of Exercise per Session:   Stress:   . Feeling of Stress :   Social Connections:   . Frequency of Communication with Friends and Family:   . Frequency of Social Gatherings with Friends and Family:   . Attends Religious Services:   . Active Member of Clubs or Organizations:   . Attends Archivist Meetings:   Marland Kitchen Marital Status:   Intimate Partner Violence:   . Fear of Current or Ex-Partner:   . Emotionally Abused:   Marland Kitchen Physically Abused:   . Sexually Abused:      FAMILY HISTORY: Family History  Problem Relation Age of Onset  . Multiple sclerosis Mother   . Heart Problems Mother        by-pass  . Alcohol abuse Mother   . Heart Problems Sister        pacemaker     ALLERGIES:   is allergic to aptiom [eslicarbazepine], azithromycin, bupropion, carbamazepine, dilantin [phenytoin sodium extended], diphenhydramine, divalproex sodium, keppra [levetiracetam], lamictal [lamotrigine], lorazepam, lyrica [pregabalin], morphine and related, other, prednisone, and vimpat [lacosamide].   MEDICATIONS:  Current Outpatient Medications  Medication Sig Dispense Refill  . amphetamine-dextroamphetamine (ADDERALL) 20 MG tablet Take 20 mg by mouth 3 (three) times daily.     Marland Kitchen aspirin 81 MG tablet Take 1 tablet (81 mg total) by mouth 2 (two) times daily. (Patient taking differently: Take 81 mg by mouth daily. )    . clonazePAM (KLONOPIN) 2 MG tablet Take 1/2 tablet 4 times a day 60 tablet 5  . cloNIDine (CATAPRES) 0.1 MG tablet Take 1 tablet (0.1 mg total) by mouth 3 (three) times daily. 270 tablet 0  . doxepin (SINEQUAN) 10 MG  capsule TAKE 2 -3 CAPS BY MOUTH AT BEDTIME 270 capsule 0  . gabapentin (NEURONTIN) 100 MG capsule Take 100 mg by mouth 3 (three) times daily.    Marland Kitchen ipratropium (ATROVENT) 0.06 % nasal spray Place 2 sprays into both nostrils 4 (four) times daily.    . QUEtiapine (SEROQUEL) 25 MG tablet Take 1-2 tabs po QHS 60 tablet 0  . topiramate (TOPAMAX) 100 MG tablet Take 2 tablets twice a day 120 tablet 11   No current facility-administered medications for this visit.     REVIEW OF SYSTEMS:   A 10+ POINT REVIEW OF SYSTEMS WAS OBTAINED including neurology,  dermatology, psychiatry, cardiac, respiratory, lymph, extremities, GI, GU, Musculoskeletal, constitutional, breasts, reproductive, HEENT.  All pertinent positives are noted in the HPI.  All others are negative.   PHYSICAL EXAMINATION: ECOG PERFORMANCE STATUS: 1 - Symptomatic but completely ambulatory  Vitals:   01/05/20 1447  BP: 124/83  Pulse: 83  Resp: 18  Temp: (!) 97.3 F (36.3 C)  SpO2: 99%   Filed Weights   01/05/20 1447  Weight: 109 lb 8 oz (49.7 kg)   Body mass index is 17.15 kg/m.  GENERAL:alert, in no acute distress and comfortable SKIN: no acute rashes, no significant lesions EYES: conjunctiva are pink and non-injected, sclera anicteric OROPHARYNX: MMM, no exudates, no oropharyngeal erythema or ulceration NECK: supple, no JVD LYMPH:  no palpable lymphadenopathy in the cervical, axillary or inguinal regions LUNGS: clear to auscultation b/l with normal respiratory effort HEART: regular rate & rhythm ABDOMEN:  normoactive bowel sounds , non tender, not distended. Extremity: no pedal edema PSYCH: alert & oriented x 3 with fluent speech NEURO: no focal motor/sensory deficits  LABORATORY DATA:  I have reviewed the data as listed  CBC Latest Ref Rng & Units 01/05/2020 01/05/2020 03/25/2017  WBC 4.0 - 10.5 K/uL 3.0(L) - 4.1  Hemoglobin 12.0 - 15.0 g/dL 12.0 - 8.1(L)  Hematocrit 34.0 - 46.6 % 37.7 39.5 25.9(L)  Platelets 150 -  400 K/uL 285 - 218   ANC 1500  CMP Latest Ref Rng & Units 03/24/2017 03/23/2017 03/22/2017  Glucose 65 - 99 mg/dL 87 107(H) 96  BUN 6 - 20 mg/dL '17 14 8  '$ Creatinine 0.44 - 1.00 mg/dL 0.62 0.72 0.71  Sodium 135 - 145 mmol/L 143 141 141  Potassium 3.5 - 5.1 mmol/L 3.5 3.8 3.7  Chloride 101 - 111 mmol/L 113(H) 111 108  CO2 22 - 32 mmol/L '27 25 28  '$ Calcium 8.9 - 10.3 mg/dL 7.9(L) 8.3(L) 8.8(L)  Total Protein 6.5 - 8.1 g/dL - - 6.1(L)  Total Bilirubin 0.3 - 1.2 mg/dL - - 1.4(H)  Alkaline Phos 38 - 126 U/L - - 82  AST 15 - 41 U/L - - 16  ALT 14 - 54 U/L - - 11(L)      RADIOGRAPHIC STUDIES: I have personally reviewed the radiological images as listed and agreed with the findings in the report. No results found.   ASSESSMENT & PLAN:  Brena Windsor is a 69 y.o. female with:  1. 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 Previous w/u at [facility] including BM Bx with no other overt pathology.   PLAN: -Discussed patient's most recent labs from 10/18/19, WBC at 3.9K, MCHC at 32.2, MPV at 6.7 -Advised low White counts does not typically cause fatigue  -Advised fatigue could be caused by other factors  -Advised on immune thrombocytopenias or immune nutropenias  -Advised unlikely acute pathology  -Advised unlikely genetic birth defect abnormality  -Advised pt did have Vitamin B12 deficiency previously- can cause lower counts, larger RBC, and neutropenia -Advised subtle nutritional deficiency -Advised chronic condition- medication driven or chronic immune process -Advised on optimizing B vitamins (B-complex supplement) -Advised on pernicious anemia  -Advised on cyclical neutropenia (usually diagnosed when pt is younger, unlikely pt having this)  -Advised on immunoglobulin labs -Advised on limited indications for use of G CSF -Advised pt distributes neutrophils differently  -Recommends checking blood every 4 months  -Recommends getting ferritin checked  -Recommends taking  B-complex supplement  -Recommends PCP continue to watch and will see back if needed  -Will get labs today  -Will  see back via phone visit in 2 weeks   FOLLOW UP Labs today Phone visit with Dr Irene Limbo in 2 weeks  . Orders Placed This Encounter  Procedures  . CBC with Differential/Platelet    Standing Status:   Future    Number of Occurrences:   1    Standing Expiration Date:   01/04/2021  . CMP ([place] only)    Standing Status:   Future    Number of Occurrences:   1    Standing Expiration Date:   01/04/2021  . Vitamin B12    Standing Status:   Future    Number of Occurrences:   1    Standing Expiration Date:   01/04/2021  . Methylmalonic acid, serum    Standing Status:   Future    Number of Occurrences:   1    Standing Expiration Date:   01/04/2021  . Folate RBC    Standing Status:   Future    Number of Occurrences:   1    Standing Expiration Date:   01/04/2021  . Copper, serum    Standing Status:   Future    Number of Occurrences:   1    Standing Expiration Date:   01/04/2021  . Carnitine / acylcarnitine profile, bld    Standing Status:   Future    Number of Occurrences:   1    Standing Expiration Date:   01/04/2021  . Multiple Myeloma Panel (SPEP&IFE w/QIG)    Standing Status:   Future    Number of Occurrences:   1    Standing Expiration Date:   01/04/2021  . ANA, IFA (with reflex)    Standing Status:   Future    Number of Occurrences:   1    Standing Expiration Date:   01/04/2021  . Intrinsic factor antibodies    Standing Status:   Future    Number of Occurrences:   1    Standing Expiration Date:   01/04/2021  . Anti-parietal antibody    Standing Status:   Future    Number of Occurrences:   1    Standing Expiration Date:   01/04/2021  . Kappa/lambda light chains    Standing Status:   Future    Number of Occurrences:   1    Standing Expiration Date:   01/04/2021  . Ferritin    Standing Status:   Future    Number of Occurrences:   1    Standing Expiration Date:   01/04/2021    . Iron and TIBC    Standing Status:   Future    Number of Occurrences:   1    Standing Expiration Date:   01/04/2021    The total time spent in the appt was 50 minutes and more than 50% was on counseling and direct patient cares.  All of the patient's questions were answered with apparent satisfaction. The patient knows to call the clinic with any problems, questions or concerns.   Sullivan Lone MD MS AAHIVMS [facility] Hematology/Oncology Physician [facility]  (Office):       [phone] (Work cell):  [phone] (Fax):           [phone]  01/05/2020 3:49 PM  I, Dawayne Cirri am acting as a Education administrator for Dr. Sullivan Lone.   .I have reviewed the above documentation for accuracy and completeness, and I agree with the above. Brunetta Genera MD

## 2020-01-04 NOTE — Telephone Encounter (Signed)
Do we have the records aside from the CD? Thanks

## 2020-01-05 ENCOUNTER — Inpatient Hospital Stay: Payer: Medicare HMO | Attending: Hematology | Admitting: Hematology

## 2020-01-05 ENCOUNTER — Inpatient Hospital Stay: Payer: Medicare HMO

## 2020-01-05 ENCOUNTER — Other Ambulatory Visit: Payer: Self-pay

## 2020-01-05 VITALS — BP 124/83 | HR 83 | Temp 97.3°F | Resp 18 | Ht 67.0 in | Wt 109.5 lb

## 2020-01-05 DIAGNOSIS — D509 Iron deficiency anemia, unspecified: Secondary | ICD-10-CM

## 2020-01-05 DIAGNOSIS — D75839 Thrombocytosis, unspecified: Secondary | ICD-10-CM

## 2020-01-05 DIAGNOSIS — Z862 Personal history of diseases of the blood and blood-forming organs and certain disorders involving the immune mechanism: Secondary | ICD-10-CM | POA: Diagnosis not present

## 2020-01-05 DIAGNOSIS — A770 Spotted fever due to Rickettsia rickettsii: Secondary | ICD-10-CM

## 2020-01-05 DIAGNOSIS — Z79899 Other long term (current) drug therapy: Secondary | ICD-10-CM

## 2020-01-05 DIAGNOSIS — D72819 Decreased white blood cell count, unspecified: Secondary | ICD-10-CM | POA: Diagnosis not present

## 2020-01-05 DIAGNOSIS — G40909 Epilepsy, unspecified, not intractable, without status epilepticus: Secondary | ICD-10-CM | POA: Diagnosis not present

## 2020-01-05 DIAGNOSIS — F909 Attention-deficit hyperactivity disorder, unspecified type: Secondary | ICD-10-CM | POA: Insufficient documentation

## 2020-01-05 DIAGNOSIS — D709 Neutropenia, unspecified: Secondary | ICD-10-CM

## 2020-01-05 LAB — CBC WITH DIFFERENTIAL/PLATELET
Abs Immature Granulocytes: 0 10*3/uL (ref 0.00–0.07)
Basophils Absolute: 0.1 10*3/uL (ref 0.0–0.1)
Basophils Relative: 2 %
Eosinophils Absolute: 0 10*3/uL (ref 0.0–0.5)
Eosinophils Relative: 1 %
HCT: 39.5 % (ref 36.0–46.0)
Hemoglobin: 12 g/dL (ref 12.0–15.0)
Immature Granulocytes: 0 %
Lymphocytes Relative: 39 %
Lymphs Abs: 1.2 10*3/uL (ref 0.7–4.0)
MCH: 28.2 pg (ref 26.0–34.0)
MCHC: 30.4 g/dL (ref 30.0–36.0)
MCV: 92.7 fL (ref 80.0–100.0)
Monocytes Absolute: 0.3 10*3/uL (ref 0.1–1.0)
Monocytes Relative: 10 %
Neutro Abs: 1.5 10*3/uL — ABNORMAL LOW (ref 1.7–7.7)
Neutrophils Relative %: 48 %
Platelets: 285 10*3/uL (ref 150–400)
RBC: 4.26 MIL/uL (ref 3.87–5.11)
RDW: 13.6 % (ref 11.5–15.5)
WBC: 3 10*3/uL — ABNORMAL LOW (ref 4.0–10.5)
nRBC: 0 % (ref 0.0–0.2)

## 2020-01-05 LAB — VITAMIN B12: Vitamin B-12: 969 pg/mL — ABNORMAL HIGH (ref 180–914)

## 2020-01-05 LAB — CMP (CANCER CENTER ONLY)
ALT: 14 U/L (ref 0–44)
AST: 19 U/L (ref 15–41)
Albumin: 3.8 g/dL (ref 3.5–5.0)
Alkaline Phosphatase: 152 U/L — ABNORMAL HIGH (ref 38–126)
Anion gap: 11 (ref 5–15)
BUN: 22 mg/dL (ref 8–23)
CO2: 23 mmol/L (ref 22–32)
Calcium: 9.5 mg/dL (ref 8.9–10.3)
Chloride: 106 mmol/L (ref 98–111)
Creatinine: 0.84 mg/dL (ref 0.44–1.00)
GFR, Est AFR Am: >60 mL/min (ref >60)
GFR, Estimated: >60 mL/min (ref >60)
Glucose, Bld: 90 mg/dL (ref 70–99)
Potassium: 4.2 mmol/L (ref 3.5–5.1)
Sodium: 140 mmol/L (ref 135–145)
Total Bilirubin: 0.3 mg/dL (ref 0.3–1.2)
Total Protein: 7.5 g/dL (ref 6.5–8.1)

## 2020-01-05 NOTE — Patient Instructions (Signed)
Thank you for choosing [facility] to provide your oncology and hematology care.   Should you have questions after your visit to the [facility] ([facility]), please contact this office at [phone] between 8:30 AM and 4:30 PM.  Voice mails left after 4:00 PM may not be returned until the following business day.  Calls received after 4:30 PM will be answered by an off-site Nurse Triage Line.    Prescription Refills:  Please have your pharmacy contact us directly for most prescription requests.  Contact the office directly for refills of narcotics (pain medications). Allow 48-72 hours for refills.  Appointments: Please contact the [facility] scheduling department [phone] for questions regarding [facility] appointment scheduling.  Contact the schedulers with any scheduling changes so that your appointment can be rescheduled in a timely manner.   Central Scheduling for [place] [phone] - Call to schedule procedures such as PET scans, CT scans, MRI, Ultrasound, etc.  To afford each patient quality time with our providers, please arrive 30 minutes before your scheduled appointment time.  If you arrive late for your appointment, you may be asked to reschedule.  We strive to give you quality time with our providers, and arriving late affects you and other patients whose appointments are after yours. If you are a no show for multiple scheduled visits, you may be dismissed from the clinic at the providers discretion.     Resources: [facility] Social Workers [phone] for additional information on assistance programs or assistance connecting with community support programs   [place] DSS  336-641-3447: Information regarding food stamps, Medicaid, and utility assistance [place] 336-333-6589   [place] [employer]'s shared-ride transportation service for eligible riders who have a disability that prevents them from riding the fixed route bus.   Medicare Rights Center  800-333-4114 Helps people with Medicare understand their rights and benefits, navigate the Medicare system, and secure the quality healthcare they deserve [employer] 800-227-2345 Assists patients locate various types of support and financial assistance Cancer Care: 1-800-813-HOPE (4673) Provides financial assistance, online support groups, medication/co-pay assistance.   Transportation Assistance for appointments at [facility]: Transportation Coordinator [phone]  Again, thank you for choosing [facility] for your care.       

## 2020-01-06 LAB — KAPPA/LAMBDA LIGHT CHAINS
Kappa free light chain: 23.3 mg/L — ABNORMAL HIGH (ref 3.3–19.4)
Kappa, lambda light chain ratio: 1.39 (ref 0.26–1.65)
Lambda free light chains: 16.8 mg/L (ref 5.7–26.3)

## 2020-01-06 LAB — IRON AND TIBC
Iron: 57 ug/dL (ref 41–142)
Saturation Ratios: 20 % — ABNORMAL LOW (ref 21–57)
TIBC: 293 ug/dL (ref 236–444)
UIBC: 235 ug/dL (ref 120–384)

## 2020-01-06 LAB — ANTI-PARIETAL ANTIBODY: Parietal Cell Antibody-IgG: 1.7 Units (ref 0.0–20.0)

## 2020-01-06 LAB — ANTINUCLEAR ANTIBODIES, IFA: ANA Ab, IFA: NEGATIVE

## 2020-01-06 LAB — FERRITIN: Ferritin: 43 ng/mL (ref 11–307)

## 2020-01-06 LAB — INTRINSIC FACTOR ANTIBODIES: Intrinsic Factor: 0.9 AU/mL (ref 0.0–1.1)

## 2020-01-06 LAB — FOLATE RBC
Folate, Hemolysate: 477 ng/mL
Folate, RBC: 1265 ng/mL (ref >498)
Hematocrit: 37.7 % (ref 34.0–46.6)

## 2020-01-07 LAB — METHYLMALONIC ACID, SERUM: Methylmalonic Acid, Quantitative: 106 nmol/L (ref 0–378)

## 2020-01-07 LAB — COPPER, SERUM: Copper: 134 ug/dL (ref 80–158)

## 2020-01-11 LAB — CARNITINE / ACYLCARNITINE PROFILE, BLD
Carnitine, Esterfied/Free: 0.7 Ratio (ref 0.0–0.9)
Carnitine, Free: 35 umol/L (ref 20–55)
Carnitine, Total: 61 umol/L (ref 27–73)

## 2020-01-11 LAB — MULTIPLE MYELOMA PANEL, SERUM
Albumin SerPl Elph-Mcnc: 3.9 g/dL (ref 2.9–4.4)
Albumin/Glob SerPl: 1.2 (ref 0.7–1.7)
Alpha 1: 0.2 g/dL (ref 0.0–0.4)
Alpha2 Glob SerPl Elph-Mcnc: 0.8 g/dL (ref 0.4–1.0)
B-Globulin SerPl Elph-Mcnc: 1 g/dL (ref 0.7–1.3)
Gamma Glob SerPl Elph-Mcnc: 1.4 g/dL (ref 0.4–1.8)
Globulin, Total: 3.4 g/dL (ref 2.2–3.9)
IgA: 335 mg/dL (ref 87–352)
IgG (Immunoglobin G), Serum: 1212 mg/dL (ref 586–1602)
IgM (Immunoglobulin M), Srm: 290 mg/dL — ABNORMAL HIGH (ref 26–217)
Total Protein ELP: 7.3 g/dL (ref 6.0–8.5)

## 2020-01-14 ENCOUNTER — Other Ambulatory Visit: Payer: Self-pay | Admitting: Psychiatry

## 2020-01-14 DIAGNOSIS — F5101 Primary insomnia: Secondary | ICD-10-CM

## 2020-01-14 DIAGNOSIS — F39 Unspecified mood [affective] disorder: Secondary | ICD-10-CM

## 2020-01-17 ENCOUNTER — Telehealth: Payer: Self-pay | Admitting: Hematology

## 2020-01-17 NOTE — Telephone Encounter (Signed)
90 day okay? 

## 2020-01-18 ENCOUNTER — Inpatient Hospital Stay (HOSPITAL_BASED_OUTPATIENT_CLINIC_OR_DEPARTMENT_OTHER): Payer: Medicare HMO | Admitting: Hematology

## 2020-01-18 ENCOUNTER — Telehealth: Payer: Self-pay | Admitting: Hematology

## 2020-01-18 DIAGNOSIS — D709 Neutropenia, unspecified: Secondary | ICD-10-CM

## 2020-01-18 DIAGNOSIS — D473 Essential (hemorrhagic) thrombocythemia: Secondary | ICD-10-CM | POA: Diagnosis not present

## 2020-01-18 DIAGNOSIS — D75839 Thrombocytosis, unspecified: Secondary | ICD-10-CM

## 2020-01-18 NOTE — Progress Notes (Signed)
HEMATOLOGY/ONCOLOGY CONSULTATION NOTE  Date of Service: 01/18/2020  Patient Care Team: Rubie Maid, MD as PCP - General (Family Medicine)  REFERRING PHYSICIAN: Rubie Maid, MD  CHIEF COMPLAINTS/PURPOSE OF CONSULTATION:  Neutropenia/Thrombocytosis    HISTORY OF PRESENTING ILLNESS:  Kelli Navarro is a wonderful 69 y.o. female who has been referred to [place] by Rubie Maid, MD for evaluation and management of neutropenia/thrombocytosis. Pt is accompanied today by her spouse. The pt reports that she is doing well overall.   The pt reports she is good. Pt took a 18 foot fall from a tree and she is doing much better. Pt was placed on Gabapentin for the fall. 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 The [place] spotted fever was found 18 years ago. She had blisters in mouth, couldn't swallow, had high fevers, sudden weight loss in 1 month and cause changes in blood counts. The first time her blood counts were low was found 18 years ago with the [place] spotted fever.   Her blood counts are always low and fluctuate since the [place] spotted fever.  She never had low blood counts previously to the [place] spotted fever.  Her RBC usually are just below normal. She can usually tell when her counts are low because she doesn't heal well and has fatigue. Pt has had no internal infections. She has gotten her pneumonia, influenza, COVID19, and Shingrix vaccines. Pt has mouth soars often. In the last year she has not felt any different and has not had many changes.    Pt currently takes hair, skin and nails supplement, multivitamin, and vitamin B12 supplements.  Pt use to have epileptic episodes where she had seizures 3 times a day up to about two years ago. The episodes are now under control. She has ADHD and takes medication for it and exercises. Previously she had Iron deficiency and was given shots in 2018.   Most recent lab results (10/18/19) of  CBC is as follows: all values are WNL except for WBC at 3.9K, MCHC at 32.2, MPV at 6.7   On review of systems, pt reports mouth soars, episodes of fatigue, steady weight, abscesses and denies joint pain, edema, skin rashes, diarrhea, dry mouth, dry eyes, abdominal pain,  and any other symptoms.   On PMHx the pt reports ADHD, epileptic, [facility] Spotted Fever, Vitamin B12 deficiency in 2018, allergy to prednisone, iron deficiency in 2018, no liver disease  On Social Hx the pt reports she was vegan, now eats some salmon, no alcohol, no other chemical use   On Family Hx the pt reports no history of thyroid disease  INTERVAL HISTORY:  I connected with  Kelli Navarro on 01/18/20 by telephone and verified that I am speaking with the correct person using two identifiers.   I discussed the limitations of evaluation and management by telemedicine. The patient expressed understanding and agreed to proceed.  Other persons participating in the visit and their role in the encounter:       -Yevette Edwards, Medical Scribe   Patient's location: Home  Provider's location: [place] [facility] [place] is a wonderful 70 y.o. female who is here for evaluation and management of neutropenia/thrombocytosis. The patient's last visit with [place] was on 01/05/2020. The pt reports that she is doing well overall.  The pt reports that she has been stable and has no new concerns. She continues taking a Vitamin B12 supplement daily.   Lab results (01/05/20) of  CBC w/diff and CMP is as follows: all values are WNL except for WBC at 3.0K, Neutro Abs at 1.5K, ALP at 152. 01/05/2020 Ferritin at 43 01/05/2020 Copper at 134 01/05/2020 Vitamin B12 at 969 01/05/2020 Intrinsic Factor at 0.9 01/05/2020 ANA Ab, IFA is "Negative" 01/05/2020 Methylmalonic Acid at 106 01/05/2020 Parietal Cell Antibody-IgG at 1.7 01/05/2020 MMP shows all values are WNL except for: IgM at 290 01/05/2020 Folate Hemolysate at  477.0, HCT at 37.7, Folate RBC at 1265 01/05/2020 Iron Panel is as follows: Iron at 57, TIBC at 293, Sat Ratios at 20, UIBC at 235 01/05/2020 Carnitine / acylcarnitine profile is as follows: Carnitine, Total at 61, Carnitine, Free at 35, Carnitine, Esterfied/Free at 0.7 01/05/2020 K/L light chains is as follows: Kappa free light chain at 23.3, Lamda free light chains at 16.8, K/L light chain ratio at 1.39  On review of systems, pt denies any other symptoms.    MEDICAL HISTORY:  Past Medical History:  Diagnosis Date  . ADHD   . Chronic back pain   . Depression   . Hypertension   . Right femoral fracture ([place])   . [facility] spotted fever   . Seizures ([place])   . Vitamin B 12 deficiency      SURGICAL HISTORY: Past Surgical History:  Procedure Laterality Date  . BACK SURGERY    . none    . radial frequency ablasion  05/2019   back  . 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 Marital status: Married    Spouse name: Not on file  . Number of children: 2  . Years of education: 56  . Highest education level: Not on file  Occupational History  . Occupation: retired  Tobacco Use  . Smoking status: Never Smoker  . Smokeless tobacco: Never Used  Vaping Use  . Vaping Use: Never used  Substance and Sexual Activity  . Alcohol use: No  . Drug use: No  . Sexual activity: Not on file  Other Topics Concern  . 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 Difficulty of Paying Living Expenses:   Food Insecurity:   . Worried About Charity fundraiser in the Last Year:   . Youth worker in the Last Year:   Transportation Needs:   . Film/video editor (Medical):   Marland Kitchen Lack of Transportation (Non-Medical):   Physical Activity:   . Days of Exercise per Week:   . Minutes of Exercise per Session:   Stress:   . Feeling of Stress :   Social Connections:   . Frequency of Communication with Friends and Family:   . Frequency of Social Gatherings with Friends and Family:   . Attends Religious Services:   . Active Member of Clubs or Organizations:   . Attends Archivist Meetings:   Marland Kitchen Marital Status:   Intimate Partner Violence:   . Fear of Current or Ex-Partner:   . Emotionally Abused:   Marland Kitchen Physically Abused:   . Sexually Abused:  FAMILY HISTORY: Family History  Problem Relation Age of Onset  . Multiple sclerosis Mother   . Heart Problems Mother        by-pass  . Alcohol abuse Mother   . Heart Problems Sister        pacemaker     ALLERGIES:   is allergic to aptiom [eslicarbazepine], azithromycin, bupropion, carbamazepine, dilantin [phenytoin sodium extended], diphenhydramine, divalproex sodium, keppra [levetiracetam], lamictal [lamotrigine], lorazepam, lyrica [pregabalin], morphine and related, other, prednisone, and vimpat [lacosamide].   MEDICATIONS:  Current Outpatient Medications  Medication Sig Dispense Refill  . amphetamine-dextroamphetamine (ADDERALL) 20 MG tablet Take 20 mg by mouth 3 (three) times daily.     Marland Kitchen aspirin 81 MG tablet Take 1 tablet (81 mg total) by mouth 2 (two) times daily. (Patient taking differently: Take 81 mg by mouth daily. )    . clonazePAM (KLONOPIN) 2 MG tablet Take 1/2 tablet 4 times a day 60 tablet 5  . cloNIDine (CATAPRES) 0.1 MG tablet Take 1 tablet (0.1 mg total) by mouth 3 (three) times daily. 270 tablet 0  . doxepin (SINEQUAN) 10 MG capsule TAKE 2 -3 CAPS BY MOUTH AT BEDTIME 270 capsule 0  . gabapentin (NEURONTIN) 100 MG capsule Take 100 mg by mouth 3 (three) times daily.    Marland Kitchen ipratropium (ATROVENT)  0.06 % nasal spray Place 2 sprays into both nostrils 4 (four) times daily.    . QUEtiapine (SEROQUEL) 25 MG tablet TAKE 1 TO 2 TABLETS BY MOUTH AT BEDTIME 180 tablet 1  . topiramate (TOPAMAX) 100 MG tablet Take 2 tablets twice a day 120 tablet 11   No current facility-administered medications for this visit.     REVIEW OF SYSTEMS:   A 10+ POINT REVIEW OF SYSTEMS WAS OBTAINED including neurology, dermatology, psychiatry, cardiac, respiratory, lymph, extremities, GI, GU, Musculoskeletal, constitutional, breasts, reproductive, HEENT.  All pertinent positives are noted in the HPI.  All others are negative.   PHYSICAL EXAMINATION: ECOG PERFORMANCE STATUS: 1 - Symptomatic but completely ambulatory  There were no vitals filed for this visit. There were no vitals filed for this visit. There is no height or weight on file to calculate BMI.  Telehealth visit   LABORATORY DATA:  I have reviewed the data as listed  CBC Latest Ref Rng & Units 01/05/2020 01/05/2020 03/25/2017  WBC 4.0 - 10.5 K/uL 3.0(L) - 4.1  Hemoglobin 12.0 - 15.0 g/dL 12.0 - 8.1(L)  Hematocrit 34.0 - 46.6 % 37.7 39.5 25.9(L)  Platelets 150 - 400 K/uL 285 - 218   ANC 1500  CMP Latest Ref Rng & Units 01/05/2020 03/24/2017 03/23/2017  Glucose 70 - 99 mg/dL 90 87 107(H)  BUN 8 - 23 mg/dL 22 17 14   Creatinine 0.44 - 1.00 mg/dL 0.84 0.62 0.72  Sodium 135 - 145 mmol/L 140 143 141  Potassium 3.5 - 5.1 mmol/L 4.2 3.5 3.8  Chloride 98 - 111 mmol/L 106 113(H) 111  CO2 22 - 32 mmol/L 23 27 25   Calcium 8.9 - 10.3 mg/dL 9.5 7.9(L) 8.3(L)  Total Protein 6.5 - 8.1 g/dL 7.5 - -  Total Bilirubin 0.3 - 1.2 mg/dL 0.3 - -  Alkaline Phos 38 - 126 U/L 152(H) - -  AST 15 - 41 U/L 19 - -  ALT 0 - 44 U/L 14 - -      RADIOGRAPHIC STUDIES: I have personally reviewed the radiological images as listed and agreed with the findings in the report. No results found.   ASSESSMENT & PLAN:  Kelli Navarro is a 69 y.o. female with:  1. 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 Previous w/u at [facility] including BM Bx with no other overt pathology.   PLAN: -Discussed pt labwork, 01/05/20; mild leukopenia, no anemia, no thrombocytopenia, slightly elevated ALP.  -Discussed that 01/05/20 Folate, Parietal Cell Antibody-IgG, Intrinsic Factor, ANA Ab, IFA, Carnitine / acylcarnitine profile, Copper, Methylmalonic Acid, Vitamin B-12, Ferritin are all nml   -Discussed 01/05/2020 Iron Panel shows Sat Ratios at 20 -Discussed 01/05/2020 K/L light chains shows Kappa free light chain at 23.3 -Discussed 01/05/2020 MMP shows all values are WNL except for: IgM at 290 -Elevated ALP may be medication related  -Advised pt that although Ferritin is nml, not unreasonable to keep ? 100 for improved energy  With po iron -- f/u with PCP -Advised pt that IgM was elevated, but was not clonal. Likely indicating a reactive increase.  -No indication for further w/u at this time. Recommend pt continue to receive periodic labwork.  -Recommend pt f/u with PCP for WBC monitoring  -Will see back as needed    FOLLOW UP RTC with Dr Irene Limbo with labs as needed    The total time spent in the appt was 20 minutes and more than 50% was on counseling and direct patient cares.  All of the patient's questions were answered with apparent satisfaction. The patient knows to call the clinic with any problems, questions or concerns.   Sullivan Lone MD [place] AAHIVMS [facility] Hematology/Oncology Physician [facility]  (Office):       [phone] (Work cell):  [phone] (Fax):           [phone]  01/18/2020 3:17 PM  I, Yevette Edwards, am acting as a scribe for Dr. Sullivan Lone.   .I have reviewed the above documentation for accuracy and completeness, and I agree with the above. Brunetta Genera MD

## 2020-01-18 NOTE — Telephone Encounter (Signed)
Scheduled per 07/01 los, patient has been called and voicemail was left regarding 07/14 appointment.

## 2020-01-18 NOTE — Telephone Encounter (Signed)
90 day okay for her?

## 2020-01-25 ENCOUNTER — Other Ambulatory Visit: Payer: Self-pay | Admitting: Psychiatry

## 2020-01-25 DIAGNOSIS — F5101 Primary insomnia: Secondary | ICD-10-CM

## 2020-02-01 ENCOUNTER — Other Ambulatory Visit: Payer: Self-pay | Admitting: Neurology

## 2020-02-29 ENCOUNTER — Encounter: Payer: Self-pay | Admitting: Psychiatry

## 2020-02-29 ENCOUNTER — Telehealth (INDEPENDENT_AMBULATORY_CARE_PROVIDER_SITE_OTHER): Payer: Medicare HMO | Admitting: Psychiatry

## 2020-02-29 VITALS — Wt 111.0 lb

## 2020-02-29 DIAGNOSIS — F419 Anxiety disorder, unspecified: Secondary | ICD-10-CM | POA: Diagnosis not present

## 2020-02-29 DIAGNOSIS — F39 Unspecified mood [affective] disorder: Secondary | ICD-10-CM | POA: Diagnosis not present

## 2020-02-29 DIAGNOSIS — F5101 Primary insomnia: Secondary | ICD-10-CM | POA: Diagnosis not present

## 2020-02-29 MED ORDER — ESCITALOPRAM OXALATE 5 MG PO TABS: 5.0000 mg | ORAL_TABLET | Freq: Every day | ORAL | 1 refills | Status: DC

## 2020-02-29 MED ORDER — CLONIDINE HCL 0.1 MG PO TABS: 0.1000 mg | ORAL_TABLET | Freq: Three times a day (TID) | ORAL | 0 refills | Status: DC

## 2020-02-29 MED ORDER — DOXEPIN HCL 10 MG PO CAPS: ORAL_CAPSULE | ORAL | 0 refills | Status: DC

## 2020-02-29 NOTE — Progress Notes (Signed)
Kelli Navarro [id number] [birth year] 69 y.o.  Virtual Visit via Telephone Note  I connected with pt on 02/29/20 at  1:00 PM EDT by telephone and verified that I am speaking with the correct person using two identifiers.   I discussed the limitations, risks, security and privacy concerns of performing an evaluation and management service by telephone and the availability of in person appointments. I also discussed with the patient that there may be a patient responsible charge related to this service. The patient expressed understanding and agreed to proceed.   I discussed the assessment and treatment plan with the patient. The patient was provided an opportunity to ask questions and all were answered. The patient agreed with the plan and demonstrated an understanding of the instructions.   The patient was advised to call back or seek an in-person evaluation if the symptoms worsen or if the condition fails to improve as anticipated.  I provided 25 minutes of non-face-to-face time during this encounter.  The patient was located at home.  The provider was located at [place].   Thayer Headings, PMHNP   Subjective:   Patient ID:  Kelli Navarro is a 69 y.o. (DOB [birth year]) female.  Chief Complaint:  Chief Complaint  Patient presents with  . Depression  . Anxiety  . Sleeping Problem    HPI Kelli Navarro presents for follow-up of anxiety and mood disturbance.She reports that she had a fall in June and fell 21 feet and injured her left arm, leg, and back. She has been in bed and this has been difficult for her. Awaiting ortho care in September. She reports that she has been having some depression and irritability in response to not being able to be active. She reports that she has not been able to read recently and does not enjoy TV. She reports that her sleep has been disturbed. Estimates sleeping 3 hours a night. She reports that her energy and motivation have  been good but is not able to physically do anything. Appetite is low. Concentration has been poor. Denies SI.   Review of Systems:  Review of Systems  Musculoskeletal: Positive for back pain. Negative for gait problem.  Neurological: Negative for tremors.  Psychiatric/Behavioral:       Please refer to HPI    Medications: I have reviewed the patient's current medications.  Current Outpatient Medications  Medication Sig Dispense Refill  . amphetamine-dextroamphetamine (ADDERALL) 20 MG tablet Take 20 mg by mouth 3 (three) times daily.     . clonazePAM (KLONOPIN) 2 MG tablet TAKE 1/2 TABLET 4 TIMES A DAY 60 tablet 5  . cloNIDine (CATAPRES) 0.1 MG tablet Take 1 tablet (0.1 mg total) by mouth 3 (three) times daily. 270 tablet 0  . doxepin (SINEQUAN) 10 MG capsule TAKE 2 -3 CAPS BY MOUTH AT BEDTIME 270 capsule 0  . ipratropium (ATROVENT) 0.06 % nasal spray Place 2 sprays into both nostrils 4 (four) times daily.    Marland Kitchen topiramate (TOPAMAX) 100 MG tablet Take 2 tablets twice a day 120 tablet 11  . aspirin 81 MG tablet Take 1 tablet (81 mg total) by mouth 2 (two) times daily. (Patient taking differently: Take 81 mg by mouth daily. )    . escitalopram (LEXAPRO) 5 MG tablet Take 1 tablet (5 mg total) by mouth daily. 30 tablet 1  . gabapentin (NEURONTIN) 100 MG capsule Take 100 mg by mouth 3 (three) times daily. (Patient not taking: Reported on 02/29/2020)  No current facility-administered medications for this visit.    Medication Side Effects: None  Allergies:  Allergies  Allergen Reactions  . Aptiom [Eslicarbazepine]   . Azithromycin   . Bupropion Other (See Comments)     Caused depression  . Carbamazepine   . Dilantin [Phenytoin Sodium Extended]   . Diphenhydramine Other (See Comments)    Feels like "spiders are crawling" on her  . Divalproex Sodium   . Keppra [Levetiracetam]   . Lamictal [Lamotrigine]   . Lorazepam Swelling    Hallucinations  . Lyrica [Pregabalin] Other (See  Comments)    Pt reports feeling loopy and confused  . Morphine And Related     At high doses  . Other     Allergy to steroids causing anaphylaxis    . Prednisone   . Vimpat [Lacosamide]     Past Medical History:  Diagnosis Date  . ADHD   . Chronic back pain   . Depression   . Hypertension   . Right femoral fracture ([place])   . [facility] spotted fever   . Seizures ([place])   . Vitamin B 12 deficiency     Family History  Problem Relation Age of Onset  . Multiple sclerosis Mother   . Heart Problems Mother        by-pass  . Alcohol abuse Mother   . Heart Problems Sister        pacemaker    Social History   Socioeconomic History  . Marital status: Married    Spouse name: Not on file  . Number of children: 2  . Years of education: 22  . Highest education level: Not on file  Occupational History  . Occupation: retired  Tobacco Use  . Smoking status: Never Smoker  . Smokeless tobacco: Never Used  Vaping Use  . Vaping Use: Never used  Substance and Sexual Activity  . Alcohol use: No  . Drug use: No  . Sexual activity: Not on file  Other Topics Concern  . 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 Difficulty of Paying Living Expenses: Not on file  Food Insecurity:   . Worried About Charity fundraiser in the Last Year: Not on file  . Ran Out of Food in the Last Year: Not on file  Transportation Needs:   . Lack of Transportation (Medical): Not on file  . Lack of Transportation (Non-Medical): Not on file  Physical Activity:   . Days of Exercise per Week: Not on file  . Minutes of Exercise per Session: Not on file  Stress:   . Feeling of Stress : Not on file  Social Connections:   . Frequency of Communication with Friends and Family: Not on file  . Frequency of  Social Gatherings with Friends and Family: Not on file  . Attends Religious Services: Not on file  . Active Member of Clubs or Organizations: Not on file  . Attends Archivist Meetings: Not on file  . Marital Status: Not on file  Intimate Partner Violence:   . Fear of Current or Ex-Partner: Not on file  . Emotionally Abused: Not on file  . Physically Abused: Not on file  . Sexually Abused: Not on  file    Past Medical History, Surgical history, Social history, and Family history were reviewed and updated as appropriate.   Please see review of systems for further details on the patient's review from today.   Objective:   Physical Exam:  Wt 111 lb (50.3 kg)   BMI 17.39 kg/m   Physical Exam Neurological:     Mental Status: She is alert and oriented to person, place, and time.     Cranial Nerves: No dysarthria.  Psychiatric:        Attention and Perception: Attention and perception normal.        Mood and Affect: Mood is anxious and depressed.        Speech: Speech normal.        Behavior: Behavior is cooperative.        Thought Content: Thought content normal. Thought content is not paranoid or delusional. Thought content does not include homicidal or suicidal ideation. Thought content does not include homicidal or suicidal plan.        Cognition and Memory: Cognition and memory normal.        Judgment: Judgment normal.     Comments: Insight intact     Lab Review:     Component Value Date/Time   NA 140 01/05/2020 1607   K 4.2 01/05/2020 1607   CL 106 01/05/2020 1607   CO2 23 01/05/2020 1607   GLUCOSE 90 01/05/2020 1607   BUN 22 01/05/2020 1607   CREATININE 0.84 01/05/2020 1607   CALCIUM 9.5 01/05/2020 1607   PROT 7.5 01/05/2020 1607   ALBUMIN 3.8 01/05/2020 1607   AST 19 01/05/2020 1607   ALT 14 01/05/2020 1607   ALKPHOS 152 (H) 01/05/2020 1607   BILITOT 0.3 01/05/2020 1607   GFRNONAA >60 01/05/2020 1607   GFRAA >60 01/05/2020 1607       Component  Value Date/Time   WBC 3.0 (L) 01/05/2020 1607   RBC 4.26 01/05/2020 1607   HGB 12.0 01/05/2020 1607   HCT 37.7 01/05/2020 1608   HCT 39.5 01/05/2020 1607   PLT 285 01/05/2020 1607   MCV 92.7 01/05/2020 1607   MCH 28.2 01/05/2020 1607   MCHC 30.4 01/05/2020 1607   RDW 13.6 01/05/2020 1607   LYMPHSABS 1.2 01/05/2020 1607   MONOABS 0.3 01/05/2020 1607   EOSABS 0.0 01/05/2020 1607   BASOSABS 0.1 01/05/2020 1607    No results found for: POCLITH, LITHIUM   No results found for: PHENYTOIN, PHENOBARB, VALPROATE, CBMZ   .res Assessment: Plan:   Pt inquires about Lexapro to possibly improve mood and anxiety. Discussed potential benefits, risks, and side effects of Lexapro. Will start Lexapro 5 mg po qd. Continue Clonidine 0.1 mg po TID Continue Doxepin 10 mg 2-3 capsules po QHS for insomnia.  Pt to f/u in 4 weeks or sooner if clinically indicated.  Patient advised to contact office with any questions, adverse effects, or acute worsening in signs and symptoms.  Kelli Navarro was seen today for depression, anxiety and sleeping problem.  Diagnoses and all orders for this visit:  Mood disorder ([place]) -     escitalopram (LEXAPRO) 5 MG tablet; Take 1 tablet (5 mg total) by mouth daily.  Anxiety -     escitalopram (LEXAPRO) 5 MG tablet; Take 1 tablet (5 mg total) by mouth daily. -     cloNIDine (CATAPRES) 0.1 MG tablet; Take 1 tablet (0.1 mg total) by mouth 3 (three) times daily.  Primary insomnia -     doxepin (  SINEQUAN) 10 MG capsule; TAKE 2 -3 CAPS BY MOUTH AT BEDTIME    Please see After Visit Summary for patient specific instructions.  Future Appointments  Date Time Provider [place]  08/06/2020  3:00 PM Cameron Sprang, MD [facility] None    No orders of the defined types were placed in this encounter.     -------------------------------

## 2020-03-05 ENCOUNTER — Telehealth: Payer: Self-pay | Admitting: Neurology

## 2020-03-05 NOTE — Telephone Encounter (Signed)
Patient states that her dr from [facility] sent Dr Delice Lesch a copy of her MRI. She is needing to come by the office to pick up that MRI to take to another appointment. She wants to come by Wednesday before 10am to pick it up.

## 2020-03-05 NOTE — Telephone Encounter (Signed)
Ok, disc given to front staff for pickup

## 2020-03-23 ENCOUNTER — Other Ambulatory Visit: Payer: Self-pay | Admitting: Psychiatry

## 2020-03-23 DIAGNOSIS — F39 Unspecified mood [affective] disorder: Secondary | ICD-10-CM

## 2020-03-23 DIAGNOSIS — F419 Anxiety disorder, unspecified: Secondary | ICD-10-CM

## 2020-03-25 ENCOUNTER — Other Ambulatory Visit: Payer: Self-pay | Admitting: Psychiatry

## 2020-03-25 DIAGNOSIS — F419 Anxiety disorder, unspecified: Secondary | ICD-10-CM

## 2020-03-26 NOTE — Telephone Encounter (Signed)
review 

## 2020-04-07 ENCOUNTER — Observation Stay (HOSPITAL_COMMUNITY)
Admission: EM | Admit: 2020-04-07 | Discharge: 2020-04-07 | Discharge status: Against Medical Advice | Payer: Medicare HMO | Attending: Internal Medicine | Admitting: Internal Medicine

## 2020-04-07 ENCOUNTER — Emergency Department (HOSPITAL_COMMUNITY): Payer: Medicare HMO

## 2020-04-07 ENCOUNTER — Encounter (HOSPITAL_COMMUNITY): Payer: Self-pay

## 2020-04-07 DIAGNOSIS — I10 Essential (primary) hypertension: Secondary | ICD-10-CM | POA: Diagnosis not present

## 2020-04-07 DIAGNOSIS — S0003XA Contusion of scalp, initial encounter: Secondary | ICD-10-CM | POA: Diagnosis not present

## 2020-04-07 DIAGNOSIS — W0110XA Fall on same level from slipping, tripping and stumbling with subsequent striking against unspecified object, initial encounter: Secondary | ICD-10-CM | POA: Insufficient documentation

## 2020-04-07 DIAGNOSIS — Z79899 Other long term (current) drug therapy: Secondary | ICD-10-CM | POA: Diagnosis not present

## 2020-04-07 DIAGNOSIS — F909 Attention-deficit hyperactivity disorder, unspecified type: Secondary | ICD-10-CM | POA: Diagnosis not present

## 2020-04-07 DIAGNOSIS — W19XXXA Unspecified fall, initial encounter: Secondary | ICD-10-CM | POA: Diagnosis present

## 2020-04-07 DIAGNOSIS — Z20822 Contact with and (suspected) exposure to covid-19: Secondary | ICD-10-CM | POA: Diagnosis not present

## 2020-04-07 DIAGNOSIS — R4182 Altered mental status, unspecified: Principal | ICD-10-CM

## 2020-04-07 DIAGNOSIS — G40909 Epilepsy, unspecified, not intractable, without status epilepticus: Secondary | ICD-10-CM

## 2020-04-07 DIAGNOSIS — R55 Syncope and collapse: Secondary | ICD-10-CM | POA: Diagnosis not present

## 2020-04-07 DIAGNOSIS — S0083XA Contusion of other part of head, initial encounter: Secondary | ICD-10-CM | POA: Diagnosis not present

## 2020-04-07 LAB — RESPIRATORY PANEL BY RT PCR (FLU A&B, COVID)
Influenza A by PCR: NEGATIVE
Influenza B by PCR: NEGATIVE
SARS Coronavirus 2 by RT PCR: NEGATIVE

## 2020-04-07 LAB — URINALYSIS, COMPLETE (UACMP) WITH MICROSCOPIC
Bacteria, UA: NONE SEEN
Bilirubin Urine: NEGATIVE
Glucose, UA: NEGATIVE mg/dL
Hgb urine dipstick: NEGATIVE
Ketones, ur: NEGATIVE mg/dL
Leukocytes,Ua: NEGATIVE
Nitrite: NEGATIVE
Protein, ur: NEGATIVE mg/dL
Specific Gravity, Urine: 1.012 (ref 1.005–1.030)
pH: 7 (ref 5.0–8.0)

## 2020-04-07 LAB — CBC WITH DIFFERENTIAL/PLATELET
Abs Immature Granulocytes: 0.02 10*3/uL (ref 0.00–0.07)
Basophils Absolute: 0.1 10*3/uL (ref 0.0–0.1)
Basophils Relative: 1 %
Eosinophils Absolute: 0.1 10*3/uL (ref 0.0–0.5)
Eosinophils Relative: 3 %
HCT: 41.6 % (ref 36.0–46.0)
Hemoglobin: 13.1 g/dL (ref 12.0–15.0)
Immature Granulocytes: 1 %
Lymphocytes Relative: 24 %
Lymphs Abs: 1 10*3/uL (ref 0.7–4.0)
MCH: 27.7 pg (ref 26.0–34.0)
MCHC: 31.5 g/dL (ref 30.0–36.0)
MCV: 87.9 fL (ref 80.0–100.0)
Monocytes Absolute: 0.3 10*3/uL (ref 0.1–1.0)
Monocytes Relative: 8 %
Neutro Abs: 2.6 10*3/uL (ref 1.7–7.7)
Neutrophils Relative %: 63 %
Platelets: 426 10*3/uL — ABNORMAL HIGH (ref 150–400)
RBC: 4.73 MIL/uL (ref 3.87–5.11)
RDW: 13.3 % (ref 11.5–15.5)
WBC: 4.2 10*3/uL (ref 4.0–10.5)
nRBC: 0 % (ref 0.0–0.2)

## 2020-04-07 LAB — COMPREHENSIVE METABOLIC PANEL
ALT: 16 U/L (ref 0–44)
AST: 24 U/L (ref 15–41)
Albumin: 3.4 g/dL — ABNORMAL LOW (ref 3.5–5.0)
Alkaline Phosphatase: 88 U/L (ref 38–126)
Anion gap: 11 (ref 5–15)
BUN: 26 mg/dL — ABNORMAL HIGH (ref 8–23)
CO2: 22 mmol/L (ref 22–32)
Calcium: 9.6 mg/dL (ref 8.9–10.3)
Chloride: 104 mmol/L (ref 98–111)
Creatinine, Ser: 0.79 mg/dL (ref 0.44–1.00)
GFR calc Af Amer: >60 mL/min (ref >60)
GFR calc non Af Amer: >60 mL/min (ref >60)
Glucose, Bld: 108 mg/dL — ABNORMAL HIGH (ref 70–99)
Potassium: 4 mmol/L (ref 3.5–5.1)
Sodium: 137 mmol/L (ref 135–145)
Total Bilirubin: 0.5 mg/dL (ref 0.3–1.2)
Total Protein: 7.5 g/dL (ref 6.5–8.1)

## 2020-04-07 LAB — AMMONIA: Ammonia: 22 umol/L (ref 9–35)

## 2020-04-07 LAB — RAPID URINE DRUG SCREEN, HOSP PERFORMED
Amphetamines: POSITIVE — AB
Barbiturates: NOT DETECTED
Benzodiazepines: POSITIVE — AB
Cocaine: NOT DETECTED
Opiates: NOT DETECTED
Tetrahydrocannabinol: NOT DETECTED

## 2020-04-07 LAB — ETHANOL: Alcohol, Ethyl (B): <10 mg/dL (ref <10)

## 2020-04-07 LAB — CBG MONITORING, ED: Glucose-Capillary: 108 mg/dL — ABNORMAL HIGH (ref 70–99)

## 2020-04-07 MED ORDER — ACETAMINOPHEN 650 MG RE SUPP: 650.0000 mg | Freq: Four times a day (QID) | RECTAL | Status: DC | PRN

## 2020-04-07 MED ORDER — ACETAMINOPHEN 325 MG PO TABS: 650.0000 mg | ORAL_TABLET | Freq: Four times a day (QID) | ORAL | Status: DC | PRN

## 2020-04-07 MED ORDER — ONDANSETRON HCL 4 MG PO TABS: 4.0000 mg | ORAL_TABLET | Freq: Four times a day (QID) | ORAL | Status: DC | PRN

## 2020-04-07 MED ORDER — ONDANSETRON HCL 4 MG/2ML IJ SOLN: 4.0000 mg | Freq: Four times a day (QID) | INTRAMUSCULAR | Status: DC | PRN

## 2020-04-07 NOTE — Progress Notes (Signed)
TRH night shift.  The patient declined to be admitted.  She asked me to leave loudly after I introduced myself and told her that Dr. Ronnald Nian had asked me to observe her in our service overnight.  Dr. Ronnald Nian spoke to the patient and her wife.  She was subsequently discharged.  Observation admission was consults.  Tennis Must, MD

## 2020-04-07 NOTE — ED Notes (Signed)
Patients wife left to go home. Patient becoming very agitated and not redirectable at this time. Patient refusing all medical care at this time. This Rn, [place] representative and nicholas NT attempting to get patient back in bed to prevent falls. Patient being verbally abusive to staff and not cooperative. Patients wife has been called. Patients wife and patient agreed to leave AMA. Provider aware.

## 2020-04-07 NOTE — ED Triage Notes (Signed)
Per ems pt fell about 1 week ago, but didn't come to the hospital. Per wife patient has been falling a lot last week. Pt has bruising to the L eye, hematoma to the back of the head and the front of the forehead on the L side. Patient c/o head pain and back pain. Possible etoh on board. Patient also brady at about 42-45. Pt alert and oriented to self only.

## 2020-04-07 NOTE — ED Notes (Signed)
Pt was standing by wife at cabinets in pt room. Pt appeared agitated that wife would not let her in bag and then pt seemed to pass out/fall and pt's wife caught her as pt gradually fell. Pt stated she did not faint but her body became too weak and she could not hold herself up. As pt fell the Dr came into the room and assessed the pt.

## 2020-04-07 NOTE — ED Provider Notes (Addendum)
Kelli Navarro Provider Note   CSN: [id number] Arrival date & time: 04/07/20  1510     History Chief Complaint  Patient presents with  . Fall  . Altered Mental Status    Kelli Navarro is a 69 y.o. female.  Patient with history of chronic pain, hypertension, seizures who presents to the ED with confusion, generalized weakness.  Patient is with significant other.  Patient had a fall several days ago where she hit the back of her head.  Does not know what caused her to fall.  Not sure if it was a seizure or unprovoked.  Did not think she tripped over anything.  Ever since then she has been intermittently confused with headaches and balance issues.  Significant other states that patient has not been eating or drinking well and she is concerned.  She denies any focal weakness or numbness.  Does not have any significant changes in her back pain.  She has had maybe more falls the last several days.  The history is provided by the patient.  Altered Mental Status Presenting symptoms: confusion (balance issues)   Severity:  Moderate Episode history:  Multiple Timing:  Intermittent Progression:  Waxing and waning Chronicity:  New Context: not alcohol use, not dementia and not recent change in medication   Associated symptoms: weakness   Associated symptoms: no abdominal pain, no fever, no palpitations, no rash, no seizures and no vomiting        Past Medical History:  Diagnosis Date  . ADHD   . Chronic back pain   . Depression   . Hypertension   . Right femoral fracture ([place])   . [facility] spotted fever   . Seizures ([place])   . Vitamin B 12 deficiency     Patient Active Problem List   Diagnosis Date Noted  . Mood disorder ([place]) 05/25/2018  . Insomnia 05/25/2018  . Seizure disorder ([place])   . Displaced fracture of left femoral neck ([place]) 03/22/2017  . Left displaced femoral neck fracture ([place]) 03/21/2017  . Fracture of 5th  metatarsal 03/21/2017  . Hypokalemia 03/21/2017  . Femur fracture ([place]) 03/21/2017  . Foot drop 03/21/2017  . Hypertension   . Chronic back pain   . Adjustment disorder with disturbance of emotion 01/01/2017  . Seizure-like activity ([place]) 10/17/2012    Past Surgical History:  Procedure Laterality Date  . BACK SURGERY    . none    . radial frequency ablasion  05/2019   back  . 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     Family History  Problem Relation Age of Onset  . Multiple sclerosis Mother   . Heart Problems Mother        by-pass  . Alcohol abuse Mother   . Heart Problems Sister        pacemaker    Social History   Tobacco Use  . Smoking status: Never Smoker  . Smokeless tobacco: Never Used  Vaping Use  . Vaping Use: Never used  Substance Use Topics  . Alcohol use: No  . Drug use: No    Home Medications Prior to Admission medications   Medication Sig Start Date End Date Taking? Authorizing Provider  amphetamine-dextroamphetamine (ADDERALL) 20 MG tablet Take 20 mg by mouth  3 (three) times daily.  06/05/17   [provider]  aspirin 81 MG tablet Take 1 tablet (81 mg total) by mouth 2 (two) times daily. Patient taking differently: Take 81 mg by mouth daily.  03/27/17   Patrecia Pour, MD  clonazePAM (KLONOPIN) 2 MG tablet TAKE 1/2 TABLET 4 TIMES A DAY 02/01/20   Cameron Sprang, MD  cloNIDine (CATAPRES) 0.1 MG tablet TAKE 1 TABLET BY MOUTH 3 TIMES A DAY 03/27/20   Thayer Headings, PMHNP  doxepin (SINEQUAN) 10 MG capsule TAKE 2 -3 CAPS BY MOUTH AT BEDTIME 02/29/20   Thayer Headings, PMHNP  escitalopram (LEXAPRO) 5 MG tablet TAKE 1 TABLET BY MOUTH EVERY DAY 03/27/20   Thayer Headings, PMHNP  gabapentin (NEURONTIN) 100 MG capsule Take 100 mg by mouth 3 (three) times  daily. Patient not taking: Reported on 02/29/2020 01/03/20   [provider]  ipratropium (ATROVENT) 0.06 % nasal spray Place 2 sprays into both nostrils 4 (four) times daily. 10/11/19   [provider]  topiramate (TOPAMAX) 100 MG tablet Take 2 tablets twice a day 07/22/19   Cameron Sprang, MD    Allergies    Aptiom [eslicarbazepine], Azithromycin, Bupropion, Carbamazepine, Dilantin [phenytoin sodium extended], Diphenhydramine, Divalproex sodium, Keppra [levetiracetam], Lamictal [lamotrigine], Lorazepam, Lyrica [pregabalin], Morphine and related, Other, Prednisone, and Vimpat [lacosamide]  Review of Systems   Review of Systems  Constitutional: Negative for chills and fever.  HENT: Negative for ear pain and sore throat.   Eyes: Negative for pain and visual disturbance.  Respiratory: Negative for cough and shortness of breath.   Cardiovascular: Negative for chest pain and palpitations.  Gastrointestinal: Negative for abdominal pain and vomiting.  Genitourinary: Negative for dysuria and hematuria.  Musculoskeletal: Negative for arthralgias and back pain.  Skin: Negative for color change and rash.  Neurological: Positive for weakness. Negative for seizures and syncope.  Psychiatric/Behavioral: Positive for confusion (balance issues).  All other systems reviewed and are negative.   Physical Exam Updated Vital Signs  ED Triage Vitals  Enc Vitals Group     BP 04/07/20 1514 (!) 153/85     Pulse Rate 04/07/20 1514 (!) 50     Resp 04/07/20 1514 16     Temp 04/07/20 1514 98.3 F (36.8 C)     Temp Source 04/07/20 1514 Oral     SpO2 04/07/20 1510 97 %     Weight --      Height --      Head Circumference --      Peak Flow --      Pain Score 04/07/20 1515 2     Pain Loc --      Pain Edu? --      Excl. in [place]? --     Physical Exam Vitals and nursing note reviewed.  Constitutional:      General: She is not in acute distress.    Appearance: She is well-developed. She is  not ill-appearing.  HENT:     Head:     Comments: Hematoma to the occiput, bruising to the left side of the face    Nose: Nose normal.     Mouth/Throat:     Mouth: Mucous membranes are moist.     Pharynx: Oropharynx is clear.  Eyes:     Extraocular Movements: Extraocular movements intact.     Conjunctiva/sclera: Conjunctivae normal.     Pupils: Pupils are equal, round, and reactive to light.  Cardiovascular:     Rate and Rhythm:  Normal rate and regular rhythm.     Heart sounds: No murmur heard.   Pulmonary:     Effort: Pulmonary effort is normal. No respiratory distress.     Breath sounds: Normal breath sounds.  Abdominal:     Palpations: Abdomen is soft.     Tenderness: There is no abdominal tenderness.  Musculoskeletal:        General: No tenderness. Normal range of motion.     Cervical back: Normal range of motion and neck supple.     Comments: No midline spinal tenderness  Skin:    General: Skin is warm and dry.  Neurological:     General: No focal deficit present.     Mental Status: She is alert and oriented to person, place, and time.     Cranial Nerves: No cranial nerve deficit.     Sensory: No sensory deficit.     Motor: No weakness.     Coordination: Coordination normal.     Comments: Patient able to ambulate slowly but appears slightly off balance, normal finger-nose-finger, normal speech, no visual field cut off, 5+ out of 5 strength, normal sensation     ED Results / Procedures / Treatments   Labs (all labs ordered are listed, but only abnormal results are displayed) Labs Reviewed  COMPREHENSIVE METABOLIC PANEL - Abnormal; Notable for the following components:      Result Value   Glucose, Bld 108 (*)    BUN 26 (*)    Albumin 3.4 (*)    All other components within normal limits  CBC WITH DIFFERENTIAL/PLATELET - Abnormal; Notable for the following components:   Platelets 426 (*)    All other components within normal limits  URINALYSIS, COMPLETE (UACMP)  WITH MICROSCOPIC - Abnormal; Notable for the following components:   APPearance HAZY (*)    All other components within normal limits  RAPID URINE DRUG SCREEN, HOSP PERFORMED - Abnormal; Notable for the following components:   Benzodiazepines POSITIVE (*)    Amphetamines POSITIVE (*)    All other components within normal limits  CBG MONITORING, ED - Abnormal; Notable for the following components:   Glucose-Capillary 108 (*)    All other components within normal limits  RESPIRATORY PANEL BY RT PCR (FLU A&B, COVID)  AMMONIA  ETHANOL  CBG MONITORING, ED    EKG EKG Interpretation  Date/Time:  Saturday April 07 2020 15:18:20 EDT Ventricular Rate:  42 PR Interval:    QRS Duration: 103 QT Interval:  479 QTC Calculation: 401 R Axis:   -10 Text Interpretation: Sinus bradycardia Anteroseptal infarct, age indeterminate Baseline wander in lead(s) V6 Confirmed by Lennice Sites [phone]) on 04/07/2020 3:28:19 PM   Radiology DG Chest 2 View  Result Date: 04/07/2020 CLINICAL DATA:  Fall.  Poor historian. EXAM: CHEST - 2 VIEW COMPARISON:  03/21/2017 FINDINGS: Cardiomediastinal contours within normal limits. No confluent consolidation. Linear left basilar opacities, compatible with atelectasis/scar. No pleural effusions. No pneumothorax. No acute osseous abnormality. Osteopenia. Gaseous distension of colon in the visualized upper abdomen. IMPRESSION: Linear left basilar atelectasis/scarring. Otherwise, no acute cardiopulmonary abnormality. Electronically Signed   By: Margaretha Sheffield MD   On: 04/07/2020 16:27   DG Pelvis 1-2 Views  Result Date: 04/07/2020 CLINICAL DATA:  Difficulty walking.  No known injury. EXAM: PELVIS - 1-2 VIEW COMPARISON:  Single view of the pelvis 03/22/2017. FINDINGS: There is no acute bony or joint abnormality. Left hip arthroplasty is in place without evidence of complication. The inferior aspect of the femoral  component is not included on this pelvis film. IMPRESSION: No  acute abnormality. Electronically Signed   By: Inge Rise M.D.   On: 04/07/2020 16:28   CT HEAD WO CONTRAST  Result Date: 04/07/2020 CLINICAL DATA:  Fall 1 week ago EXAM: CT HEAD WITHOUT CONTRAST CT MAXILLOFACIAL WITHOUT CONTRAST CT CERVICAL SPINE WITHOUT CONTRAST TECHNIQUE: Multidetector CT imaging of the head, cervical spine, and maxillofacial structures were performed using the standard protocol without intravenous contrast. Multiplanar CT image reconstructions of the cervical spine and maxillofacial structures were also generated. COMPARISON:  CT head/cervical spine dated 03/26/2017 FINDINGS: CT HEAD FINDINGS Brain: No evidence of acute infarction, hemorrhage, hydrocephalus, extra-axial collection or mass lesion/mass effect. Mild subcortical white matter and periventricular small vessel ischemic changes. Vascular: Mild intracranial atherosclerosis. Skull: Normal. Negative for fracture or focal lesion. Other: Small extracranial hematoma overlying the left posterior vertex (series 3/image 26). CT MAXILLOFACIAL FINDINGS Osseous: No evidence of maxillofacial fracture. Mandible is intact. Bilateral mandibular condyles are well-seated in the TMJs. Orbits: Bilateral orbits, including the globes and retroconal soft tissues, are within normal limits. Sinuses: The visualized paranasal sinuses are essentially clear. The mastoid air cells are unopacified. Soft tissues: Negative. CT CERVICAL SPINE FINDINGS Alignment: Exaggerated upper cervical lordosis. Skull base and vertebrae: No acute fracture. No primary bone lesion or focal pathologic process. Soft tissues and spinal canal: No prevertebral fluid or swelling. No visible canal hematoma. Disc levels: Mild to moderate degenerative changes of the lower cervical spine. Spinal canal is patent. Upper chest: Visualized lung apices are clear. Other: Visualized thyroid is unremarkable. IMPRESSION: Small extracranial hematoma overlying the left posterior vertex. No  evidence of calvarial fracture. No evidence of acute intracranial abnormality. Mild small vessel ischemic changes. No evidence of maxillofacial fracture. No evidence of traumatic injury to the cervical spine. Mild to moderate degenerative changes. Electronically Signed   By: Julian Hy M.D.   On: 04/07/2020 16:06   CT CERVICAL SPINE WO CONTRAST  Result Date: 04/07/2020 CLINICAL DATA:  Fall 1 week ago EXAM: CT HEAD WITHOUT CONTRAST CT MAXILLOFACIAL WITHOUT CONTRAST CT CERVICAL SPINE WITHOUT CONTRAST TECHNIQUE: Multidetector CT imaging of the head, cervical spine, and maxillofacial structures were performed using the standard protocol without intravenous contrast. Multiplanar CT image reconstructions of the cervical spine and maxillofacial structures were also generated. COMPARISON:  CT head/cervical spine dated 03/26/2017 FINDINGS: CT HEAD FINDINGS Brain: No evidence of acute infarction, hemorrhage, hydrocephalus, extra-axial collection or mass lesion/mass effect. Mild subcortical white matter and periventricular small vessel ischemic changes. Vascular: Mild intracranial atherosclerosis. Skull: Normal. Negative for fracture or focal lesion. Other: Small extracranial hematoma overlying the left posterior vertex (series 3/image 26). CT MAXILLOFACIAL FINDINGS Osseous: No evidence of maxillofacial fracture. Mandible is intact. Bilateral mandibular condyles are well-seated in the TMJs. Orbits: Bilateral orbits, including the globes and retroconal soft tissues, are within normal limits. Sinuses: The visualized paranasal sinuses are essentially clear. The mastoid air cells are unopacified. Soft tissues: Negative. CT CERVICAL SPINE FINDINGS Alignment: Exaggerated upper cervical lordosis. Skull base and vertebrae: No acute fracture. No primary bone lesion or focal pathologic process. Soft tissues and spinal canal: No prevertebral fluid or swelling. No visible canal hematoma. Disc levels: Mild to moderate  degenerative changes of the lower cervical spine. Spinal canal is patent. Upper chest: Visualized lung apices are clear. Other: Visualized thyroid is unremarkable. IMPRESSION: Small extracranial hematoma overlying the left posterior vertex. No evidence of calvarial fracture. No evidence of acute intracranial abnormality. Mild small vessel ischemic changes. No evidence of  maxillofacial fracture. No evidence of traumatic injury to the cervical spine. Mild to moderate degenerative changes. Electronically Signed   By: Julian Hy M.D.   On: 04/07/2020 16:06   CT Maxillofacial Wo Contrast  Result Date: 04/07/2020 CLINICAL DATA:  Fall 1 week ago EXAM: CT HEAD WITHOUT CONTRAST CT MAXILLOFACIAL WITHOUT CONTRAST CT CERVICAL SPINE WITHOUT CONTRAST TECHNIQUE: Multidetector CT imaging of the head, cervical spine, and maxillofacial structures were performed using the standard protocol without intravenous contrast. Multiplanar CT image reconstructions of the cervical spine and maxillofacial structures were also generated. COMPARISON:  CT head/cervical spine dated 03/26/2017 FINDINGS: CT HEAD FINDINGS Brain: No evidence of acute infarction, hemorrhage, hydrocephalus, extra-axial collection or mass lesion/mass effect. Mild subcortical white matter and periventricular small vessel ischemic changes. Vascular: Mild intracranial atherosclerosis. Skull: Normal. Negative for fracture or focal lesion. Other: Small extracranial hematoma overlying the left posterior vertex (series 3/image 26). CT MAXILLOFACIAL FINDINGS Osseous: No evidence of maxillofacial fracture. Mandible is intact. Bilateral mandibular condyles are well-seated in the TMJs. Orbits: Bilateral orbits, including the globes and retroconal soft tissues, are within normal limits. Sinuses: The visualized paranasal sinuses are essentially clear. The mastoid air cells are unopacified. Soft tissues: Negative. CT CERVICAL SPINE FINDINGS Alignment: Exaggerated upper  cervical lordosis. Skull base and vertebrae: No acute fracture. No primary bone lesion or focal pathologic process. Soft tissues and spinal canal: No prevertebral fluid or swelling. No visible canal hematoma. Disc levels: Mild to moderate degenerative changes of the lower cervical spine. Spinal canal is patent. Upper chest: Visualized lung apices are clear. Other: Visualized thyroid is unremarkable. IMPRESSION: Small extracranial hematoma overlying the left posterior vertex. No evidence of calvarial fracture. No evidence of acute intracranial abnormality. Mild small vessel ischemic changes. No evidence of maxillofacial fracture. No evidence of traumatic injury to the cervical spine. Mild to moderate degenerative changes. Electronically Signed   By: Julian Hy M.D.   On: 04/07/2020 16:06    Procedures Procedures (including critical care time)  Medications Ordered in ED Medications - No data to display  ED Course  I have reviewed the triage vital signs and the nursing notes.  Pertinent labs & imaging results that were available during my care of the patient were reviewed by me and considered in my medical decision making (see chart for details).    MDM Rules/Calculators/A&P                          STESHA NEYENS is a 68 year old female with history of chronic back pain, hypertension, seizure disorder who presents to the ED with altered mental status after a fall.  Patient with unremarkable vitals.  She does have sinus bradycardia on EKG but no obvious heart block.  She is not on any rate control medication like metoprolol.  She had a fall several days ago where she hit the back of her head.  She has a hematoma there and bruising to the left side of her face.  Did not seek medical attention at that time.  Since then she has had headaches, difficulty with ambulation.  But no focal weakness or numbness.  She has not had appetite and overall significant other concerned about her overall  wellbeing.  She has had multiple falls since then.  Not sure if these are syncopal episodes or not as history is difficult to fully obtain.  She denies any alcohol or drug use.  No history of those.  Neurologically she appears intact but  when she walks she appears unsteady and using objects in the room to help pull herself up.  Not sure if this is postconcussive syndrome versus less likely stroke or some type of electrolyte abnormality or some type of arrhythmia.  Given the multiple episodes will consider observation stay for telemetry and possibly brain MRI.  Possibly PT and OT as well.  Patient with unremarkable head CT, face CT, neck CT.  Drug screen positive for benzos and amphetamines.  She is on Adderall.  She is on Klonopin as well.  No significant anemia, electrolyte abnormality, kidney injury.  No urinary tract infection.  Overall work-up is unremarkable but she has had multiple sinus bradycardic events and believe she would benefit from admission for telemetry.  May also need MRI to fully rule out stroke.  This could be dementia related as well.  However seems more acute for that.  Patient does not want to stay but was amenable to an observation stay to try to get these things done.  She did have another event where she syncopized in the room but her wife caught her.  She was not on telemetry at that time.  Suspect likely symptomatic bradycardia/arrhythmia/possibly neurologic process.  Will admit for further care.  Patient left AMA prior to be admitted to the hospitalist.  This chart was dictated using voice recognition software.  Despite best efforts to proofread,  errors can occur which can change the documentation meaning.    Final Clinical Impression(s) / ED Diagnoses Final diagnoses:  Altered mental status, unspecified altered mental status type  Syncope, unspecified syncope type    Rx / DC Orders ED Discharge Orders    None       Lennice Sites, DO 04/07/20 [place], DO 04/07/20 2110

## 2020-05-03 ENCOUNTER — Telehealth: Payer: Self-pay | Admitting: Neurology

## 2020-05-03 NOTE — Telephone Encounter (Signed)
Scheduled for 05/07/20 for f/u.

## 2020-05-03 NOTE — Telephone Encounter (Signed)
Patient called in stating she had previously fallen around 3 weeks ago (04/07/20 ED visit). She said she "cracked her head open", black eye, and bruising on the left side of her face. She said ever since her memory has been going down hill. She says it comes and goes. She opened a bill for ambulance services and did not even remember ever being in an ambulance. She has "black outs" where parts of time she cannot remember at all. She also stated she falls around 2 times a month. She is not aware if she has had any seizures in 2 years, but they did start out similarly previously.

## 2020-05-07 ENCOUNTER — Other Ambulatory Visit: Payer: Self-pay

## 2020-05-07 ENCOUNTER — Encounter: Payer: Self-pay | Admitting: Neurology

## 2020-05-07 ENCOUNTER — Ambulatory Visit (INDEPENDENT_AMBULATORY_CARE_PROVIDER_SITE_OTHER): Payer: Medicare HMO | Admitting: Neurology

## 2020-05-07 VITALS — BP 135/82 | HR 89 | Ht 67.0 in | Wt 117.8 lb

## 2020-05-07 DIAGNOSIS — G2581 Restless legs syndrome: Secondary | ICD-10-CM

## 2020-05-07 DIAGNOSIS — R569 Unspecified convulsions: Secondary | ICD-10-CM | POA: Diagnosis not present

## 2020-05-07 DIAGNOSIS — R404 Transient alteration of awareness: Secondary | ICD-10-CM | POA: Diagnosis not present

## 2020-05-07 MED ORDER — TOPIRAMATE 100 MG PO TABS
ORAL_TABLET | ORAL | 3 refills | Status: DC
Start: 2020-05-07 — End: 2021-01-04

## 2020-05-07 MED ORDER — CLONAZEPAM 2 MG PO TABS
ORAL_TABLET | ORAL | 5 refills | Status: DC
Start: 2020-05-07 — End: 2021-02-15

## 2020-05-07 NOTE — Progress Notes (Signed)
NEUROLOGY FOLLOW UP OFFICE NOTE  [name] NAME [id number] 12-11-1950  HISTORY OF PRESENT ILLNESS: I had the pleasure of seeing Kelli Navarro in follow-up in the neurology clinic on 05/07/2020.  The patient was last evaluated by telephone 10 months ago for seizures. She called our office last week to report a fall where she "cracked her head open" with a black eye and left facial bruising. Since then, her memory has gone downhill. She opened a bill for ambulance services and did not even remember being in an ambulance. She has blackouts where she cannot recall parts of time. She also reported frequent falling, around 2 times a month. ER records from 10/2 were reviewed, she had an unremarkable head and neck CT, bloodwork. UDS positive for benzodiapezines and amphetamines. She is on clonazepam $RemoveBefor'2mg'flAOkBlPEspo$  1/2 tab 4 times a day and Adderall. While in the ER, she had an episode where she seemed to pass out/fall and her wife caught her, she said she did not faint but body became too weak and she could not hold herself up. She was not on telemetry at that time. EKG on admission showed sinus bradycardia at 42-45 bpm. She left AMA prior to admission, becoming very agitated in the ER and refusing all medical care. She states that she has no memory of all of this, when she came to, she was surrounded by 6 nurses wanting her to spend the night but she did not know where she was so she refused to stay.  Her wife Kelli Navarro is present during the visit to provide additional information. They report that she had a fall on their land on 12/28/19, she was planting a tree on a hill in the backyard, lost her footing and went down 21 feet. She lost all the skin on her left arm, got up and went back the ladder, then she could tell she was going to pass out so she called Kelli Navarro. She has no recollection of events after. When [name] arrived, she was awake and alert, she was not confused. She had injured her back and stayed in bed a lot,  then on 9/24 she got up and went to the wrong bathroom. She thinks she fell. She then went back to her bedroom bathroom then fell really hard after she came out, as described above. Kelli Navarro decided to bring her to the hospital on 10/2 because she was acting funny, weird, repeating herself, not remembering things. She was back to baseline after 4-5 days. Kelli Navarro reports she was only eating protein bars, Kelli Navarro has been able to get more food into her as well. The patient is concerned the blackouts have been occurring since she was diagnosed with seizures. She has not had the bigger nocturnal seizures since 2019. Since the falls, Kelli Navarro has had to get her attention more, calling her name several times and she says "what?"  She reports that pain and stress can bring on her nocturnal seizures. She reports Topiramate makes her shake so bad that she cannot write her name, but the clonidine helps 50% of the time. She is also now taking gabapentin $RemoveBeforeDEI'300mg'aCUeGLIdnAQjReAb$  in AM, $Remo'900mg'necsh$  qhs for nerve damage in her back. When she called our office, she reported 2 falls a month, however today they both report she has had only the 2 bigger falls in June and September. She has not been driving since the falls.    History on Initial Assessment 07/08/2017: This is a 69 yo RH woman with a history of hypertension,  chronic back pain, seizures, restless leg syndrome, and ADHD, presenting to establish care for her seizures. Records from her prior neurologist Dr. [name] from [facility] were reviewed. It appears she was mostly being followed for back pain. The patient reports she was being prescribed narcotics and up to $Rem'400mg'QOVw$  BID of Topamax daily, which caused her to be dizzy. She weaned herself off narcotic medication. She reports that her seizures started around 15 years ago when she had fevers and weight loss. There was initial concern about leukemia, she had a bone marrow biopsy and was diagnosed several months later (age 61) with [facility]  Spotted Fever. She states the CDC contacted her telling her "it would never be gone, and can affect different parts of my body, prominently her head." She was given antibiotics and the fevers went down, but she continued to feel unwell. She kept having episodes of loss of consciousness. She would talk the dogs out for a walk or be changing a lightbulb and pass out, falling off a ladder one time. Her wife has only witnessed one GTC many years ago, she was sitting on the chair then had a blank stare, unresponsive, then started shaking, with her head turned to one side. This lasted a couple of minutes, no tongue bite or incontinence. She reports that over time, she would go years with no daytime seizures, but continues to have nocturnal seizures occurring around 3am, she would wake up on the floor in between the desk and bed. Sometimes she was up feeling unwell. Her wife sleeps in a different room and has not witnessed the nocturnal seizures, but can tell when she has had one. Her wife reports another episode where she heard a fall last 03/21/17, she was found to have a displaced femoral neck fracture, as well as a right foot drop with fracture of the 5th metatarsal. She has been told in the past that her seizures are due to a combination of stress and pain. She has injured herself several times due to the seizures, she has had a lumbar fracture, rib fractures, fractured both feet, broke her nose falling down a flight of stairs. She has also tripped from being weak and wobbly after a seizure. She occasionally has a feeling where her head feels unclear, she can talk, denies any olfactory/gustatory hallucinations, rising epigastric sensation, focal numbness/tingling. She reports that she does not have daytime seizures because she can "block it out better," but at night she "can't control her subconscious" and has the night time seizures more. Her wife notes that when she is focused on something, she is "in a mode" and  would not respond initially, then says she is okay.   She has bilateral hand tremors and sometimes has difficulty reading her handwriting. She is scheduled for nerve ablation in her back on the 7th. They report she has a lot of problems when she is stressed and her back is hurting, she would be overwhelmed, screaming out, more depressed, and have more seizures. She has had migraines her "whole life," which she treats with biofeedback. She reports the last migraines was triggered by an unrecalled medication from her psychiatrist, this occurred a week ago. She has migraines twice a week lasting a couple of days, with associated nausea/vomiting, photosensitivity. Her balance is not good, she has to hold on to furniture. She has difficulties bending down, when she bends for the faucet, she feels disoriented (not spinning). She takes clonazepam $RemoveBeforeDE'1mg'AKwERoNnGDKWekP$  4 times a day, initially she was  taking it for RLS, then dose was increased for the seizures. Per records, she has tried several AEDs in the past, including Topamax, Tegretol (depression), Dilantin (rash), Depakote (depression), Aptiom, Ativan (swelling, hallucinations), Lamictal (headache), Vimpat (dizziness), Lyrica (edema), and Keppra (depression). She has had a 24-hour EEG reported as normal. She also reports having a home EEG that was on for a week(?) which was also normal. This was in her 42s. She is in Abilify, Doxepin, and Adderall prescribed by her psychiatrist.   Epilepsy Risk Factors:  She had a normal birth and early development.  There is no history of febrile convulsions, CNS infections such as meningitis/encephalitis, significant traumatic brain injury, neurosurgical procedures, or family history of seizures.  Prior AEDs: Topamax, Tegretol (depression), Dilantin (rash), Depakote (depression), Aptiom, Ativan (swelling, hallucinations), Lamictal (headache), Vimpat (dizziness), Lyrica (edema), and Keppra (depression)   PAST MEDICAL HISTORY: Past Medical  History:  Diagnosis Date  . ADHD   . Chronic back pain   . Depression   . Hypertension   . Right femoral fracture ([place])   . [facility] spotted fever   . Seizures ([place])   . Vitamin B 12 deficiency     MEDICATIONS: Current Outpatient Medications on File Prior to Visit  Medication Sig Dispense Refill  . acetaminophen (TYLENOL) 500 MG tablet Take 1,000 mg by mouth every 6 (six) hours as needed for headache (pain).    Marland Kitchen amphetamine-dextroamphetamine (ADDERALL) 20 MG tablet Take 20 mg by mouth 3 (three) times daily.     Marland Kitchen aspirin 81 MG tablet Take 1 tablet (81 mg total) by mouth 2 (two) times daily. (Patient taking differently: Take 81 mg by mouth daily. )    . clonazePAM (KLONOPIN) 2 MG tablet TAKE 1/2 TABLET 4 TIMES A DAY (Patient taking differently: Take 1 mg by mouth 3 (three) times daily. ) 60 tablet 5  . cloNIDine (CATAPRES) 0.1 MG tablet TAKE 1 TABLET BY MOUTH 3 TIMES A DAY (Patient taking differently: Take 0.1 mg by mouth 3 (three) times daily. ) 270 tablet 0  . doxepin (SINEQUAN) 10 MG capsule TAKE 2 -3 CAPS BY MOUTH AT BEDTIME (Patient taking differently: Take 10-30 mg by mouth at bedtime. ) 270 capsule 0  . escitalopram (LEXAPRO) 5 MG tablet TAKE 1 TABLET BY MOUTH EVERY DAY (Patient taking differently: Take 5 mg by mouth daily. ) 90 tablet 1  . gabapentin (NEURONTIN) 100 MG capsule Take 100 mg by mouth at bedtime.     Marland Kitchen ipratropium (ATROVENT) 0.06 % nasal spray Place 2 sprays into both nostrils 2 (two) times daily as needed for rhinitis.     Marland Kitchen topiramate (TOPAMAX) 100 MG tablet Take 2 tablets twice a day (Patient taking differently: Take 100-200 mg by mouth See admin instructions. Take 2 tablets (200 mg) by mouth every morning, 1 tablet (100 mg) with lunch and 1 tablet (100 mg) at night) 120 tablet 11   No current facility-administered medications on file prior to visit.    ALLERGIES: Allergies  Allergen Reactions  . Other Anaphylaxis    Allergy to steroids causing  anaphylaxis    . Aptiom [Eslicarbazepine] Other (See Comments)    Unknown reaction  . Azithromycin Other (See Comments)    Unknown reaction   . Bupropion Other (See Comments)     Caused depression  . Carbamazepine Other (See Comments)    Unknown reaction   . Dilantin [Phenytoin Sodium Extended] Other (See Comments)    Unknown reaction   . Diphenhydramine Other (  See Comments)    Feels like "spiders are crawling" on her  . Divalproex Sodium Other (See Comments)    Unknown reaction   . Keppra [Levetiracetam] Other (See Comments)    Unknown reaction   . Lamictal [Lamotrigine] Other (See Comments)    Unknown reaction   . Lorazepam Swelling and Other (See Comments)    Hallucinations  . Lyrica [Pregabalin] Other (See Comments)    Pt reports feeling loopy and confused  . Morphine And Related Other (See Comments)    Unknown reaction - (high doses)  . Prednisone Other (See Comments)    Unknown reaction   . Vimpat [Lacosamide] Other (See Comments)    Unknown reaction     FAMILY HISTORY: Family History  Problem Relation Age of Onset  . Multiple sclerosis Mother   . Heart Problems Mother        by-pass  . Alcohol abuse Mother   . Heart Problems Sister        pacemaker    SOCIAL HISTORY: Social History   Socioeconomic History  . Marital status: Married    Spouse name: Not on file  . Number of children: 2  . Years of education: 77  . Highest education level: Not on file  Occupational History  . Occupation: retired  Tobacco Use  . Smoking status: Never Smoker  . Smokeless tobacco: Never Used  Vaping Use  . Vaping Use: Never used  Substance and Sexual Activity  . Alcohol use: No  . Drug use: No  . Sexual activity: Not on file  Other Topics Concern  . 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 Difficulty of Paying Living Expenses: Not on file  Food Insecurity:   . Worried About Charity fundraiser in the Last Year: Not on file  . Ran Out of Food in the Last Year: Not on file  Transportation Needs:   . Lack of Transportation (Medical): Not on file  . Lack of Transportation (Non-Medical): Not on file  Physical Activity:   . Days of Exercise per Week: Not on file  . Minutes of Exercise per Session: Not on file  Stress:   . Feeling of Stress : Not on file  Social Connections:   . Frequency of Communication with Friends and Family: Not on file  . Frequency of Social Gatherings with Friends and Family: Not on file  . Attends Religious Services: Not on file  . Active Member of Clubs or Organizations: Not on file  . Attends Archivist Meetings: Not on file  . Marital Status: Not on file  Intimate Partner Violence:   . Fear of Current or Ex-Partner: Not on file  . Emotionally Abused: Not on file  . Physically Abused: Not on file  . Sexually Abused: Not on file     PHYSICAL EXAM: Vitals:   05/07/20 1122  BP: 135/82  Pulse: 89  SpO2: 98%   General: No acute distress Head:  Normocephalic/atraumatic Skin/Extremities: No rash, no edema Neurological Exam: alert and oriented to person, place, and time. No aphasia or dysarthria. Fund of knowledge is appropriate.  Recent and remote memory are intact.  Attention and concentration are normal.   Cranial nerves: Pupils equal,  round. Extraocular movements intact with no nystagmus. Visual fields full.  No facial asymmetry.  Motor: Bulk and tone normal, muscle strength 5/5 throughout with no pronator drift.   Finger to nose testing intact.  Gait slow and cautious reporting left leg pain, unsteady.    IMPRESSION: This is a 69 yo RH woman with a history of hypertension, chronic back pain, restless leg syndrome, ADHD, and seizures. 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 She denies any nocturnal seizures since 2019, however presents with new symptoms of confusion, blackouts (which she reports have been happening since the seizures started). MRI brain with and without contrast and 1-hour EEG will be ordered, if normal we will do a 72-hour EEG to further classify events. Continue Topiramate 200mg  BID and clonazepam 2mg  1/2 tab four times a day for seizures, RLS, anxiety. I discussed with her that symptoms can also be due to stress/anxiety. Continue follow-up with Psychiatry. She is aware of  driving laws to stop driving after an episode of loss of consciousness until 6 months event-free. Follow-up after tests, they know to call for any changes.    Thank you for allowing me to participate in her care.  Please do not hesitate to call for any questions or concerns.   Ellouise Newer, M.D.   CC: Dr. Bebe Shaggy

## 2020-05-07 NOTE — Patient Instructions (Addendum)
1. Schedule MRI brain with and without contrast  2. Schedule 1-hour EEG. If normal, we will plan for a 72-hour EEG  3. Continue all your medications  4. Follow-up after tests, call for any changes  Seizure Precautions: 1. If medication has been prescribed for you to prevent seizures, take it exactly as directed.  Do not stop taking the medicine without talking to your doctor first, even if you have not had a seizure in a long time.   2. Avoid activities in which a seizure would cause danger to yourself or to others.  Don't operate dangerous machinery, swim alone, or climb in high or dangerous places, such as on ladders, roofs, or girders.  Do not drive unless your doctor says you may.  3. If you have any warning that you may have a seizure, lay down in a safe place where you can't hurt yourself.    4.  No driving for 6 months from last seizure, as per [facility].   Please refer to the following link on the [place] website for more information: http://www.epilepsyfoundation.org/answerplace/Social/driving/drivingu.cfm   5.  Maintain good sleep hygiene. Avoid alcohol.  6.  Contact your doctor if you have any problems that may be related to the medicine you are taking.  7.  Call 911 and bring the patient back to the ED if:        A.  The seizure lasts longer than 5 minutes.       B.  The patient doesn't awaken shortly after the seizure  C.  The patient has new problems such as difficulty seeing, speaking or moving  D.  The patient was injured during the seizure  E.  The patient has a temperature over 102 F (39C)  F.  The patient vomited and now is having trouble breathing

## 2020-05-09 ENCOUNTER — Telehealth: Payer: Self-pay | Admitting: Neurology

## 2020-05-09 NOTE — Telephone Encounter (Signed)
Can you pls confirm with her which hospital and request records, I don't see any EEGs in the [facility] system.Thanks

## 2020-05-09 NOTE — Telephone Encounter (Signed)
Spoke with pt she was seen in the ER on 04/07/20 looking at her Discharge papers she did not have and EEG she had an EKG and an some lab work and a CT scan, pt stated she will be here for her appointment for her EEG she was confused on what all the test she had. She was reading from it looks from her CT scan

## 2020-05-09 NOTE — Telephone Encounter (Signed)
Patient called in and saw she had just done an EEG at the hospital less than a week ago. She has one scheduled with [place] on Monday. She would like to know if she needs to have it done again?

## 2020-05-14 ENCOUNTER — Other Ambulatory Visit: Payer: Self-pay

## 2020-05-14 ENCOUNTER — Ambulatory Visit (INDEPENDENT_AMBULATORY_CARE_PROVIDER_SITE_OTHER): Payer: Medicare HMO | Admitting: Neurology

## 2020-05-14 DIAGNOSIS — R404 Transient alteration of awareness: Secondary | ICD-10-CM

## 2020-05-14 DIAGNOSIS — G2581 Restless legs syndrome: Secondary | ICD-10-CM

## 2020-05-14 DIAGNOSIS — R569 Unspecified convulsions: Secondary | ICD-10-CM

## 2020-05-16 ENCOUNTER — Telehealth: Payer: Self-pay

## 2020-05-16 NOTE — Procedures (Signed)
ELECTROENCEPHALOGRAM REPORT  Date of Study: 05/14/2020  Patient's Name: Kelli Navarro MRN: [id number] Date of Birth: 1951/02/13  Referring Provider: Dr. Ellouise Newer  Clinical History: This is a 69 year old woman with a history of seizures reporting blackouts, confusion. EEG for classification.  Medications: TOPAMAX 100 MG tablet KLONOPIN 2 MG tablet NEURONTIN 100 MG capsule TYLENOL) 500 MG tablet ADDERALL) 20 MG tablet aspirin 81 MG tablet CATAPRES 0.1 MG tablet SINEQUAN 10 MG capsule LEXAPRO 5 MG tablet ATROVENT 0.06 % nasal spray   Technical Summary: A multichannel digital 1-hour  EEG recording measured by the international 10-20 system with electrodes applied with paste and impedances below 5000 ohms performed in our laboratory with EKG monitoring in an awake and drowsy patient.  Hyperventilation was not performed. Photic stimulation was performed.  The digital EEG was referentially recorded, reformatted, and digitally filtered in a variety of bipolar and referential montages for optimal display.    Description: The patient is awake and drowsy during the recording.  During maximal wakefulness, there is a symmetric, medium voltage 9 Hz posterior dominant rhythm that attenuates with eye opening.  The record is symmetric.  During drowsiness, there is an increase in theta slowing of the background with central beta activity seen. Sleep was not captured. Photic stimulation did not elicit any abnormalities.  There were no epileptiform discharges or electrographic seizures seen.    EKG lead was unremarkable.  Impression: This 1-hour awake and drowsy EEG is normal.    Clinical Correlation: A normal EEG does not exclude a clinical diagnosis of epilepsy.  If further clinical questions remain, prolonged EEG may be helpful.  Clinical correlation is advised.   Ellouise Newer, M.D.

## 2020-05-16 NOTE — Telephone Encounter (Signed)
Pt called no answer LVM to call the office back  

## 2020-05-16 NOTE — Telephone Encounter (Signed)
-----   Message from Cameron Sprang, MD sent at 05/16/2020  4:12 PM EST ----- Pls let her know the EEG was normal, proceed with 72-hour EEG as discussed. Thanks

## 2020-05-17 ENCOUNTER — Telehealth: Payer: Self-pay | Admitting: Neurology

## 2020-05-17 ENCOUNTER — Telehealth: Payer: Self-pay

## 2020-05-17 NOTE — Telephone Encounter (Signed)
-----   Message from Cameron Sprang, MD sent at 05/16/2020  4:12 PM EST ----- Pls let her know the EEG was normal, proceed with 72-hour EEG as discussed. Thanks

## 2020-05-17 NOTE — Telephone Encounter (Signed)
See result notes. 

## 2020-05-17 NOTE — Telephone Encounter (Signed)
Pt called and informed that EEG was normal, proceed with 72-hour EEG as discussed

## 2020-05-17 NOTE — Telephone Encounter (Signed)
Patient left message with after hours stating she was returning a call to the office.

## 2020-05-28 ENCOUNTER — Other Ambulatory Visit: Payer: Self-pay | Admitting: Psychiatry

## 2020-05-28 DIAGNOSIS — F5101 Primary insomnia: Secondary | ICD-10-CM

## 2020-06-01 ENCOUNTER — Other Ambulatory Visit: Payer: Self-pay | Admitting: Psychiatry

## 2020-06-01 DIAGNOSIS — F419 Anxiety disorder, unspecified: Secondary | ICD-10-CM

## 2020-06-08 ENCOUNTER — Telehealth: Payer: Self-pay | Admitting: *Deleted

## 2020-06-08 NOTE — Telephone Encounter (Signed)
Rhythm called LMOM to let me know she is cancelling her 72 hour ambulatory EEG and she will call when she is ready to reschedule. She is having 2 back surgeries and will wait until after those to reschedule.

## 2020-06-11 ENCOUNTER — Other Ambulatory Visit: Payer: Medicare HMO

## 2020-07-26 ENCOUNTER — Other Ambulatory Visit: Payer: Self-pay | Admitting: Psychiatry

## 2020-07-26 DIAGNOSIS — F419 Anxiety disorder, unspecified: Secondary | ICD-10-CM

## 2020-08-01 ENCOUNTER — Other Ambulatory Visit: Payer: Self-pay | Admitting: Neurology

## 2020-08-01 NOTE — Telephone Encounter (Signed)
Can you pls check with pharmacy, the Rx from 05/2020 had 5 refills, is there an issue? Thanks

## 2020-08-06 ENCOUNTER — Ambulatory Visit: Payer: Medicare HMO | Admitting: Neurology

## 2020-08-10 ENCOUNTER — Other Ambulatory Visit: Payer: Medicare HMO

## 2020-08-14 NOTE — Patient Instructions (Addendum)
DUE TO COVID-19 ONLY ONE VISITOR IS ALLOWED TO COME WITH YOU AND STAY IN THE WAITING ROOM ONLY DURING PRE OP AND PROCEDURE DAY OF SURGERY. THE 1 VISITOR  MAY VISIT WITH YOU AFTER SURGERY IN YOUR PRIVATE ROOM DURING VISITING HOURS ONLY!  YOU NEED TO HAVE A COVID 19 TEST ON_2/9______ @__9 :00_____, THIS TEST MUST BE DONE BEFORE SURGERY,  COVID TESTING SITE [address] [place] [address], IT IS ON THE RIGHT GOING OUT [address] APPROXIMATELY  2 MINUTES PAST [employer] ON THE RIGHT. ONCE YOUR COVID TEST IS COMPLETED,  PLEASE BEGIN THE QUARANTINE INSTRUCTIONS AS OUTLINED IN YOUR HANDOUT.                Kelli Navarro    Your procedure is scheduled on: 08/17/19   Report to [facility] Main  Entrance   Report to admitting at 2:10 PM      Call this number if you have problems the morning of surgery [phone]    Remember: Do not eat food after Midnight. You may have clear liquids until 12:00 PM    CLEAR LIQUID DIET   Foods Allowed                                                                     Foods Excluded  Coffee and tea, regular and decaf                             liquids that you cannot  Plain Jell-O any favor except red or purple                                           see through such as: Fruit ices (not with fruit pulp)                                     milk, soups, orange juice  Iced Popsicles                                    All solid food Carbonated beverages, regular and diet                                    Cranberry, grape and apple juices Sports drinks like Gatorade Lightly seasoned clear broth or consume(fat free) Sugar, honey syrup      . BRUSH YOUR TEETH MORNING OF SURGERY AND RINSE YOUR MOUTH OUT, NO CHEWING GUM CANDY OR MINTS.     Take these medicines the morning of surgery with A SIP OF WATER: Clonazepam, Gabapentin, Topamax, Clonidine                                 You may not have any metal on your  body including hair pins and  piercings  Do not wear jewelry, make-up, lotions, powders or perfumes, deodorant             Do not wear nail polish on your fingernails.  Do not shave  48 hours prior to surgery.     Do not bring valuables to the hospital. [place].  Contacts, dentures or bridgework may not be worn into surgery.       Patients discharged the day of surgery will not be allowed to drive home.   IF YOU ARE HAVING SURGERY AND GOING HOME THE SAME DAY, YOU MUST HAVE AN ADULT TO DRIVE YOU HOME AND BE WITH YOU FOR 24 HOURS. YOU MAY GO HOME BY TAXI OR UBER OR ORTHERWISE, BUT AN ADULT MUST ACCOMPANY YOU HOME AND STAY WITH YOU FOR 24 HOURS.  Name and phone number of your driver:  Special Instructions: N/A              Please read over the following fact sheets you were given: _____________________________________________________________________             [facility] - Preparing for Surgery Before surgery, you can play an important role.   Because skin is not sterile, your skin needs to be as free of germs as possible.  You can reduce the number of germs on your skin by washing with CHG (chlorahexidine gluconate) soap before surgery.  CHG is an antiseptic cleaner which kills germs and bonds with the skin to continue killing germs even after washing. Please DO NOT use if you have an allergy to CHG or antibacterial soaps.  If your skin becomes reddened/irritated stop using the CHG and inform your nurse when you arrive at Short Stay. Do not shave (including legs and underarms) for at least 48 hours prior to the first CHG shower.    Please follow these instructions carefully:  1.  Shower with CHG Soap the night before surgery and the  morning of Surgery.  2.  If you choose to wash your hair, wash your hair first as usual with your  normal  shampoo.  3.  After you shampoo, rinse your hair and body thoroughly to remove the   shampoo.                                        4.  Use CHG as you would any other liquid soap.  You can apply chg directly  to the skin and wash                       Gently with a scrungie or clean washcloth.  5.  Apply the CHG Soap to your body ONLY FROM THE NECK DOWN.   Do not use on face/ open                           Wound or open sores. Avoid contact with eyes, ears mouth and genitals (private parts).                       Wash face,  Genitals (private parts) with your normal soap.             6.  Wash thoroughly, paying special attention  to the area where your surgery  will be performed.  7.  Thoroughly rinse your body with warm water from the neck down.  8.  DO NOT shower/wash with your normal soap after using and rinsing off  the CHG Soap.             9.  Pat yourself dry with a clean towel.            10.  Wear clean pajamas.            11.  Place clean sheets on your bed the night of your first shower and do not  sleep with pets. Day of Surgery : Do not apply any lotions/deodorants the morning of surgery.  Please wear clean clothes to the hospital/surgery center.  FAILURE TO FOLLOW THESE INSTRUCTIONS MAY RESULT IN THE CANCELLATION OF YOUR SURGERY PATIENT SIGNATURE_________________________________  NURSE SIGNATURE__________________________________  ________________________________________________________________________   Kelli Navarro  An incentive spirometer is a tool that can help keep your lungs clear and active. This tool measures how well you are filling your lungs with each breath. Taking long deep breaths may help reverse or decrease the chance of developing breathing (pulmonary) problems (especially infection) following:  A long period of time when you are unable to move or be active. BEFORE THE PROCEDURE   If the spirometer includes an indicator to show your best effort, your nurse or respiratory therapist will set it to a desired goal.  If possible, sit  up straight or lean slightly forward. Try not to slouch.  Hold the incentive spirometer in an upright position. INSTRUCTIONS FOR USE  1. Sit on the edge of your bed if possible, or sit up as far as you can in bed or on a chair. 2. Hold the incentive spirometer in an upright position. 3. Breathe out normally. 4. Place the mouthpiece in your mouth and seal your lips tightly around it. 5. Breathe in slowly and as deeply as possible, raising the piston or the ball toward the top of the column. 6. Hold your breath for 3-5 seconds or for as long as possible. Allow the piston or ball to fall to the bottom of the column. 7. Remove the mouthpiece from your mouth and breathe out normally. 8. Rest for a few seconds and repeat Steps 1 through 7 at least 10 times every 1-2 hours when you are awake. Take your time and take a few normal breaths between deep breaths. 9. The spirometer may include an indicator to show your best effort. Use the indicator as a goal to work toward during each repetition. 10. After each set of 10 deep breaths, practice coughing to be sure your lungs are clear. If you have an incision (the cut made at the time of surgery), support your incision when coughing by placing a pillow or rolled up towels firmly against it. Once you are able to get out of bed, walk around indoors and cough well. You may stop using the incentive spirometer when instructed by your caregiver.  RISKS AND COMPLICATIONS  Take your time so you do not get dizzy or light-headed.  If you are in pain, you may need to take or ask for pain medication before doing incentive spirometry. It is harder to take a deep breath if you are having pain. AFTER USE  Rest and breathe slowly and easily.  It can be helpful to keep track of a log of your progress. Your caregiver can provide you with a simple table  to help with this. If you are using the spirometer at home, follow these instructions: [place] IF:   You are  having difficultly using the spirometer.  You have trouble using the spirometer as often as instructed.  Your pain medication is not giving enough relief while using the spirometer.  You develop fever of 100.5 F (38.1 C) or higher. SEEK IMMEDIATE MEDICAL CARE IF:   You cough up bloody sputum that had not been present before.  You develop fever of 102 F (38.9 C) or greater.  You develop worsening pain at or near the incision site. MAKE SURE YOU:   Understand these instructions.  Will watch your condition.  Will get help right away if you are not doing well or get worse. Document Released: 11/03/2006 Document Revised: 09/15/2011 Document Reviewed: 01/04/2007 [facility] Patient Information 2014 [place], [place].   ________________________________________________________________________

## 2020-08-15 ENCOUNTER — Encounter (HOSPITAL_COMMUNITY)
Admission: RE | Admit: 2020-08-15 | Discharge: 2020-08-15 | Disposition: A | Discharge status: Home / Self Care | Payer: Medicare HMO | Source: Ambulatory Visit | Attending: Orthopedic Surgery | Admitting: Orthopedic Surgery

## 2020-08-15 ENCOUNTER — Encounter (HOSPITAL_COMMUNITY): Payer: Self-pay

## 2020-08-15 ENCOUNTER — Other Ambulatory Visit: Payer: Self-pay

## 2020-08-15 ENCOUNTER — Other Ambulatory Visit (HOSPITAL_COMMUNITY)
Admission: RE | Admit: 2020-08-15 | Discharge: 2020-08-15 | Disposition: A | Discharge status: Home / Self Care | Payer: Medicare HMO | Source: Ambulatory Visit | Attending: Orthopedic Surgery | Admitting: Orthopedic Surgery

## 2020-08-15 DIAGNOSIS — Z20822 Contact with and (suspected) exposure to covid-19: Secondary | ICD-10-CM | POA: Insufficient documentation

## 2020-08-15 DIAGNOSIS — Z01812 Encounter for preprocedural laboratory examination: Secondary | ICD-10-CM | POA: Diagnosis not present

## 2020-08-15 HISTORY — DX: Anxiety disorder, unspecified: F41.9

## 2020-08-15 LAB — BASIC METABOLIC PANEL
Anion gap: 6 (ref 5–15)
BUN: 20 mg/dL (ref 8–23)
CO2: 27 mmol/L (ref 22–32)
Calcium: 9.1 mg/dL (ref 8.9–10.3)
Chloride: 109 mmol/L (ref 98–111)
Creatinine, Ser: 0.75 mg/dL (ref 0.44–1.00)
GFR, Estimated: >60 mL/min (ref >60)
Glucose, Bld: 94 mg/dL (ref 70–99)
Potassium: 4.3 mmol/L (ref 3.5–5.1)
Sodium: 142 mmol/L (ref 135–145)

## 2020-08-15 LAB — CBC
HCT: 38 % (ref 36.0–46.0)
Hemoglobin: 11.4 g/dL — ABNORMAL LOW (ref 12.0–15.0)
MCH: 27 pg (ref 26.0–34.0)
MCHC: 30 g/dL (ref 30.0–36.0)
MCV: 89.8 fL (ref 80.0–100.0)
Platelets: 307 10*3/uL (ref 150–400)
RBC: 4.23 MIL/uL (ref 3.87–5.11)
RDW: 14.6 % (ref 11.5–15.5)
WBC: 3.1 10*3/uL — ABNORMAL LOW (ref 4.0–10.5)
nRBC: 0 % (ref 0.0–0.2)

## 2020-08-15 LAB — SURGICAL PCR SCREEN
MRSA, PCR: NEGATIVE
Staphylococcus aureus: POSITIVE — AB

## 2020-08-15 LAB — SARS CORONAVIRUS 2 (TAT 6-24 HRS): SARS Coronavirus 2: NEGATIVE

## 2020-08-15 NOTE — Progress Notes (Signed)
COVID Vaccine Completed:Yes Date COVID Vaccine completed:09/19/19-Booster 05/08/20 COVID vaccine manufacturer: Pfizer     PCP - Dr. Dianna Limbo Cardiologist - no  Chest x-ray - 04/07/20-epic EKG - 04/09/20-epic Stress Test - no ECHO - 01/12/15-epic Cardiac Cath - no Pacemaker/ICD device last checked:no  Sleep Study - no CPAP -   Fasting Blood Sugar - NA Checks Blood Sugar _____ times a day  Blood Thinner Instructions:ASA  81 M-W-F/ Dr. Bebe Shaggy Aspirin Instructions:stop 3 days prior to DOS Last Dose:08/13/20  Anesthesia review:   Patient denies shortness of breath, fever, cough and chest pain at PAT appointment yes  Patient verbalized understanding of instructions that were given to them at the PAT appointment. Patient was also instructed that they will need to review over the PAT instructions again at home before surgery. 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 She reports not having one for 2 years.

## 2020-08-16 ENCOUNTER — Ambulatory Visit (HOSPITAL_COMMUNITY): Payer: Medicare HMO | Admitting: Anesthesiology

## 2020-08-16 ENCOUNTER — Ambulatory Visit (HOSPITAL_COMMUNITY): Payer: Medicare HMO

## 2020-08-16 ENCOUNTER — Ambulatory Visit (HOSPITAL_COMMUNITY)
Admission: RE | Admit: 2020-08-16 | Discharge: 2020-08-17 | Disposition: A | Discharge status: Home / Self Care | Payer: Medicare HMO | Attending: Orthopedic Surgery | Admitting: Orthopedic Surgery

## 2020-08-16 ENCOUNTER — Encounter (HOSPITAL_COMMUNITY): Payer: Self-pay | Admitting: Orthopedic Surgery

## 2020-08-16 ENCOUNTER — Other Ambulatory Visit: Payer: Self-pay

## 2020-08-16 ENCOUNTER — Encounter (HOSPITAL_COMMUNITY)
Admission: RE | Disposition: A | Discharge status: Home / Self Care | Payer: Self-pay | Source: Home / Self Care | Attending: Orthopedic Surgery

## 2020-08-16 DIAGNOSIS — Y939 Activity, unspecified: Secondary | ICD-10-CM | POA: Diagnosis not present

## 2020-08-16 DIAGNOSIS — Z885 Allergy status to narcotic agent status: Secondary | ICD-10-CM | POA: Diagnosis not present

## 2020-08-16 DIAGNOSIS — Z7982 Long term (current) use of aspirin: Secondary | ICD-10-CM | POA: Insufficient documentation

## 2020-08-16 DIAGNOSIS — Z9109 Other allergy status, other than to drugs and biological substances: Secondary | ICD-10-CM | POA: Diagnosis not present

## 2020-08-16 DIAGNOSIS — R569 Unspecified convulsions: Secondary | ICD-10-CM | POA: Diagnosis not present

## 2020-08-16 DIAGNOSIS — W19XXXA Unspecified fall, initial encounter: Secondary | ICD-10-CM | POA: Diagnosis not present

## 2020-08-16 DIAGNOSIS — Z888 Allergy status to other drugs, medicaments and biological substances status: Secondary | ICD-10-CM | POA: Insufficient documentation

## 2020-08-16 DIAGNOSIS — Z881 Allergy status to other antibiotic agents status: Secondary | ICD-10-CM | POA: Insufficient documentation

## 2020-08-16 DIAGNOSIS — R2689 Other abnormalities of gait and mobility: Secondary | ICD-10-CM | POA: Diagnosis not present

## 2020-08-16 DIAGNOSIS — M9701XA Periprosthetic fracture around internal prosthetic right hip joint, initial encounter: Secondary | ICD-10-CM | POA: Diagnosis not present

## 2020-08-16 DIAGNOSIS — R269 Unspecified abnormalities of gait and mobility: Secondary | ICD-10-CM | POA: Diagnosis not present

## 2020-08-16 DIAGNOSIS — S72001A Fracture of unspecified part of neck of right femur, initial encounter for closed fracture: Secondary | ICD-10-CM | POA: Diagnosis present

## 2020-08-16 DIAGNOSIS — Z79899 Other long term (current) drug therapy: Secondary | ICD-10-CM | POA: Diagnosis not present

## 2020-08-16 DIAGNOSIS — S72011A Unspecified intracapsular fracture of right femur, initial encounter for closed fracture: Secondary | ICD-10-CM | POA: Insufficient documentation

## 2020-08-16 DIAGNOSIS — Z419 Encounter for procedure for purposes other than remedying health state, unspecified: Secondary | ICD-10-CM

## 2020-08-16 DIAGNOSIS — Z09 Encounter for follow-up examination after completed treatment for conditions other than malignant neoplasm: Secondary | ICD-10-CM

## 2020-08-16 HISTORY — PX: TOTAL HIP ARTHROPLASTY: SHX124

## 2020-08-16 LAB — URINALYSIS, ROUTINE W REFLEX MICROSCOPIC
Bilirubin Urine: NEGATIVE
Glucose, UA: NEGATIVE mg/dL
Hgb urine dipstick: NEGATIVE
Ketones, ur: NEGATIVE mg/dL
Leukocytes,Ua: NEGATIVE
Nitrite: NEGATIVE
Protein, ur: NEGATIVE mg/dL
Specific Gravity, Urine: 1.012 (ref 1.005–1.030)
pH: 7 (ref 5.0–8.0)

## 2020-08-16 LAB — COMPREHENSIVE METABOLIC PANEL
ALT: 12 U/L (ref 0–44)
AST: 20 U/L (ref 15–41)
Albumin: 3.5 g/dL (ref 3.5–5.0)
Alkaline Phosphatase: 114 U/L (ref 38–126)
Anion gap: 9 (ref 5–15)
BUN: 16 mg/dL (ref 8–23)
CO2: 23 mmol/L (ref 22–32)
Calcium: 9 mg/dL (ref 8.9–10.3)
Chloride: 108 mmol/L (ref 98–111)
Creatinine, Ser: 0.82 mg/dL (ref 0.44–1.00)
GFR, Estimated: >60 mL/min (ref >60)
Glucose, Bld: 72 mg/dL (ref 70–99)
Potassium: 3.6 mmol/L (ref 3.5–5.1)
Sodium: 140 mmol/L (ref 135–145)
Total Bilirubin: 0.4 mg/dL (ref 0.3–1.2)
Total Protein: 7.2 g/dL (ref 6.5–8.1)

## 2020-08-16 LAB — PROTIME-INR
INR: 1 (ref 0.8–1.2)
Prothrombin Time: 12.8 seconds (ref 11.4–15.2)

## 2020-08-16 LAB — TYPE AND SCREEN
ABO/RH(D): A POS
Antibody Screen: NEGATIVE

## 2020-08-16 SURGERY — ARTHROPLASTY, HIP, TOTAL, ANTERIOR APPROACH
Anesthesia: Spinal | Site: Hip | Laterality: Right

## 2020-08-16 MED ORDER — METHOCARBAMOL 500 MG IVPB - SIMPLE MED
500.0000 mg | Freq: Four times a day (QID) | INTRAVENOUS | Status: DC | PRN
  Filled 2020-08-16: qty 50

## 2020-08-16 MED ORDER — FENTANYL CITRATE (PF) 100 MCG/2ML IJ SOLN
25.0000 ug | INTRAMUSCULAR | Status: DC | PRN
Start: 2020-08-16 — End: 2020-08-16

## 2020-08-16 MED ORDER — ONDANSETRON HCL 4 MG/2ML IJ SOLN: 4.0000 mg | Freq: Once | INTRAMUSCULAR | Status: DC | PRN

## 2020-08-16 MED ORDER — SODIUM CHLORIDE 0.9 % IR SOLN
Status: DC | PRN
  Administered 2020-08-16 (×2): 1000 mL

## 2020-08-16 MED ORDER — PROPOFOL 500 MG/50ML IV EMUL
INTRAVENOUS | Status: AC
  Filled 2020-08-16: qty 50

## 2020-08-16 MED ORDER — ISOPROPYL ALCOHOL 70 % SOLN
Status: AC
  Filled 2020-08-16: qty 480

## 2020-08-16 MED ORDER — PHENYLEPHRINE 40 MCG/ML (10ML) SYRINGE FOR IV PUSH (FOR BLOOD PRESSURE SUPPORT)
PREFILLED_SYRINGE | INTRAVENOUS | Status: DC | PRN
  Administered 2020-08-16: 80 ug via INTRAVENOUS

## 2020-08-16 MED ORDER — MIDAZOLAM HCL 2 MG/2ML IJ SOLN
INTRAMUSCULAR | Status: AC
  Filled 2020-08-16: qty 2

## 2020-08-16 MED ORDER — KETOROLAC TROMETHAMINE 15 MG/ML IJ SOLN
7.5000 mg | Freq: Four times a day (QID) | INTRAMUSCULAR | Status: DC
  Administered 2020-08-16 – 2020-08-17 (×2): 7.5 mg via INTRAVENOUS
  Filled 2020-08-16 (×3): qty 1

## 2020-08-16 MED ORDER — OXYCODONE HCL 5 MG/5ML PO SOLN: 5.0000 mg | Freq: Once | ORAL | Status: DC | PRN

## 2020-08-16 MED ORDER — PHENOL 1.4 % MT LIQD: 1.0000 | OROMUCOSAL | Status: DC | PRN

## 2020-08-16 MED ORDER — IRRISEPT - 450ML BOTTLE WITH 0.05% CHG IN STERILE WATER, USP 99.95% OPTIME
TOPICAL | Status: DC | PRN
  Administered 2020-08-16: 450 mL

## 2020-08-16 MED ORDER — WATER FOR IRRIGATION, STERILE IR SOLN
Status: DC | PRN
  Administered 2020-08-16: 2000 mL

## 2020-08-16 MED ORDER — HYDROCODONE-ACETAMINOPHEN 7.5-325 MG PO TABS
1.0000 | ORAL_TABLET | ORAL | Status: DC | PRN
  Administered 2020-08-16 – 2020-08-17 (×3): 1 via ORAL
  Filled 2020-08-16 (×3): qty 1

## 2020-08-16 MED ORDER — ONDANSETRON HCL 4 MG PO TABS: 4.0000 mg | ORAL_TABLET | Freq: Four times a day (QID) | ORAL | Status: DC | PRN

## 2020-08-16 MED ORDER — TOPIRAMATE 100 MG PO TABS
200.0000 mg | ORAL_TABLET | Freq: Two times a day (BID) | ORAL | Status: DC
  Administered 2020-08-16 – 2020-08-17 (×2): 200 mg via ORAL
  Filled 2020-08-16 (×2): qty 2

## 2020-08-16 MED ORDER — KETOROLAC TROMETHAMINE 30 MG/ML IJ SOLN
INTRAMUSCULAR | Status: DC | PRN
  Administered 2020-08-16: 30 mg via INTRA_ARTICULAR

## 2020-08-16 MED ORDER — ALBUMIN HUMAN 5 % IV SOLN
12.5000 g | Freq: Once | INTRAVENOUS | Status: AC
  Administered 2020-08-16: 12.5 g via INTRAVENOUS

## 2020-08-16 MED ORDER — PHENYLEPHRINE HCL-NACL 10-0.9 MG/250ML-% IV SOLN
INTRAVENOUS | Status: DC | PRN
  Administered 2020-08-16: 50 ug/min via INTRAVENOUS

## 2020-08-16 MED ORDER — ALUM & MAG HYDROXIDE-SIMETH 200-200-20 MG/5ML PO SUSP: 30.0000 mL | ORAL | Status: DC | PRN

## 2020-08-16 MED ORDER — ALBUMIN HUMAN 5 % IV SOLN
INTRAVENOUS | Status: AC
  Filled 2020-08-16: qty 250

## 2020-08-16 MED ORDER — AMISULPRIDE (ANTIEMETIC) 5 MG/2ML IV SOLN: 10.0000 mg | Freq: Once | INTRAVENOUS | Status: DC | PRN

## 2020-08-16 MED ORDER — POVIDONE-IODINE 10 % EX SWAB
2.0000 "application " | Freq: Once | CUTANEOUS | Status: AC
  Administered 2020-08-16: 2 via TOPICAL

## 2020-08-16 MED ORDER — PHENYLEPHRINE HCL (PRESSORS) 10 MG/ML IV SOLN
INTRAVENOUS | Status: AC
  Filled 2020-08-16: qty 1

## 2020-08-16 MED ORDER — METOCLOPRAMIDE HCL 5 MG/ML IJ SOLN: 5.0000 mg | Freq: Three times a day (TID) | INTRAMUSCULAR | Status: DC | PRN

## 2020-08-16 MED ORDER — POVIDONE-IODINE 10 % EX SWAB: 2.0000 "application " | Freq: Once | CUTANEOUS | Status: DC

## 2020-08-16 MED ORDER — DOCUSATE SODIUM 100 MG PO CAPS
100.0000 mg | ORAL_CAPSULE | Freq: Two times a day (BID) | ORAL | Status: DC
  Administered 2020-08-16 – 2020-08-17 (×2): 100 mg via ORAL
  Filled 2020-08-16 (×2): qty 1

## 2020-08-16 MED ORDER — FENTANYL CITRATE (PF) 100 MCG/2ML IJ SOLN
INTRAMUSCULAR | Status: AC
  Filled 2020-08-16: qty 2

## 2020-08-16 MED ORDER — FENTANYL CITRATE (PF) 100 MCG/2ML IJ SOLN
INTRAMUSCULAR | Status: DC | PRN
  Administered 2020-08-16: 100 ug via INTRAVENOUS

## 2020-08-16 MED ORDER — 0.9 % SODIUM CHLORIDE (POUR BTL) OPTIME
TOPICAL | Status: DC | PRN
  Administered 2020-08-16: 1000 mL

## 2020-08-16 MED ORDER — GABAPENTIN 400 MG PO CAPS
400.0000 mg | ORAL_CAPSULE | Freq: Three times a day (TID) | ORAL | Status: DC
  Administered 2020-08-16 – 2020-08-17 (×2): 400 mg via ORAL
  Filled 2020-08-16 (×2): qty 1

## 2020-08-16 MED ORDER — KETOROLAC TROMETHAMINE 30 MG/ML IJ SOLN
INTRAMUSCULAR | Status: AC
  Filled 2020-08-16: qty 1

## 2020-08-16 MED ORDER — ORAL CARE MOUTH RINSE
15.0000 mL | Freq: Once | OROMUCOSAL | Status: AC
  Administered 2020-08-16: 15 mL via OROMUCOSAL

## 2020-08-16 MED ORDER — ONDANSETRON HCL 4 MG/2ML IJ SOLN
INTRAMUSCULAR | Status: DC | PRN
  Administered 2020-08-16: 4 mg via INTRAVENOUS

## 2020-08-16 MED ORDER — MORPHINE SULFATE (PF) 4 MG/ML IV SOLN
0.5000 mg | INTRAVENOUS | Status: DC | PRN
  Administered 2020-08-16: 1 mg via INTRAVENOUS
  Filled 2020-08-16: qty 1

## 2020-08-16 MED ORDER — DEXAMETHASONE SODIUM PHOSPHATE 10 MG/ML IJ SOLN
10.0000 mg | Freq: Once | INTRAMUSCULAR | Status: AC
  Administered 2020-08-17: 10 mg via INTRAVENOUS
  Filled 2020-08-16: qty 1

## 2020-08-16 MED ORDER — PROPOFOL 500 MG/50ML IV EMUL
INTRAVENOUS | Status: DC | PRN
  Administered 2020-08-16: 75 ug/kg/min via INTRAVENOUS

## 2020-08-16 MED ORDER — LACTATED RINGERS IV SOLN: INTRAVENOUS | Status: DC

## 2020-08-16 MED ORDER — CEFAZOLIN SODIUM-DEXTROSE 2-4 GM/100ML-% IV SOLN
2.0000 g | INTRAVENOUS | Status: AC
  Administered 2020-08-16: 2 g via INTRAVENOUS
  Filled 2020-08-16: qty 100

## 2020-08-16 MED ORDER — CHLORHEXIDINE GLUCONATE 0.12 % MT SOLN: 15.0000 mL | Freq: Once | OROMUCOSAL | Status: AC

## 2020-08-16 MED ORDER — ADULT MULTIVITAMIN W/MINERALS CH
1.0000 | ORAL_TABLET | Freq: Every day | ORAL | Status: DC
  Administered 2020-08-16 – 2020-08-17 (×2): 1 via ORAL
  Filled 2020-08-16 (×2): qty 1

## 2020-08-16 MED ORDER — SODIUM CHLORIDE 0.9 % IV SOLN: INTRAVENOUS | Status: DC

## 2020-08-16 MED ORDER — BUPIVACAINE-EPINEPHRINE (PF) 0.25% -1:200000 IJ SOLN
INTRAMUSCULAR | Status: AC
  Filled 2020-08-16: qty 30

## 2020-08-16 MED ORDER — DEXAMETHASONE SODIUM PHOSPHATE 10 MG/ML IJ SOLN
INTRAMUSCULAR | Status: AC
  Filled 2020-08-16: qty 1

## 2020-08-16 MED ORDER — CEFAZOLIN SODIUM-DEXTROSE 2-4 GM/100ML-% IV SOLN
2.0000 g | Freq: Four times a day (QID) | INTRAVENOUS | Status: AC
  Administered 2020-08-16 – 2020-08-17 (×2): 2 g via INTRAVENOUS
  Filled 2020-08-16 (×2): qty 100

## 2020-08-16 MED ORDER — TRANEXAMIC ACID-NACL 1000-0.7 MG/100ML-% IV SOLN
1000.0000 mg | INTRAVENOUS | Status: AC
  Administered 2020-08-16: 1000 mg via INTRAVENOUS
  Filled 2020-08-16: qty 100

## 2020-08-16 MED ORDER — ASPIRIN 81 MG PO CHEW
81.0000 mg | CHEWABLE_TABLET | Freq: Two times a day (BID) | ORAL | Status: DC
  Administered 2020-08-16 – 2020-08-17 (×2): 81 mg via ORAL
  Filled 2020-08-16 (×2): qty 1

## 2020-08-16 MED ORDER — AMPHETAMINE-DEXTROAMPHETAMINE 10 MG PO TABS
20.0000 mg | ORAL_TABLET | Freq: Three times a day (TID) | ORAL | Status: DC
  Administered 2020-08-17 (×2): 20 mg via ORAL
  Filled 2020-08-16 (×2): qty 2

## 2020-08-16 MED ORDER — ONDANSETRON HCL 4 MG/2ML IJ SOLN
INTRAMUSCULAR | Status: AC
  Filled 2020-08-16: qty 2

## 2020-08-16 MED ORDER — ISOPROPYL ALCOHOL 70 % SOLN
Status: DC | PRN
  Administered 2020-08-16: 1 via TOPICAL

## 2020-08-16 MED ORDER — HYDROCODONE-ACETAMINOPHEN 5-325 MG PO TABS: 1.0000 | ORAL_TABLET | ORAL | Status: DC | PRN

## 2020-08-16 MED ORDER — SODIUM CHLORIDE (PF) 0.9 % IJ SOLN
INTRAMUSCULAR | Status: DC | PRN
  Administered 2020-08-16: 30 mL

## 2020-08-16 MED ORDER — POLYETHYLENE GLYCOL 3350 17 G PO PACK: 17.0000 g | PACK | Freq: Every day | ORAL | Status: DC | PRN

## 2020-08-16 MED ORDER — IPRATROPIUM BROMIDE 0.06 % NA SOLN
2.0000 | Freq: Every day | NASAL | Status: DC
  Filled 2020-08-16: qty 15

## 2020-08-16 MED ORDER — SENNA 8.6 MG PO TABS
1.0000 | ORAL_TABLET | Freq: Two times a day (BID) | ORAL | Status: DC
  Administered 2020-08-16 – 2020-08-17 (×2): 8.6 mg via ORAL
  Filled 2020-08-16 (×2): qty 1

## 2020-08-16 MED ORDER — ACETAMINOPHEN 325 MG PO TABS
325.0000 mg | ORAL_TABLET | Freq: Four times a day (QID) | ORAL | Status: DC | PRN
Start: 2020-08-17 — End: 2020-08-17

## 2020-08-16 MED ORDER — OXYCODONE HCL 5 MG PO TABS: 5.0000 mg | ORAL_TABLET | Freq: Once | ORAL | Status: DC | PRN

## 2020-08-16 MED ORDER — METHOCARBAMOL 500 MG PO TABS
500.0000 mg | ORAL_TABLET | Freq: Four times a day (QID) | ORAL | Status: DC | PRN
  Administered 2020-08-16 – 2020-08-17 (×3): 500 mg via ORAL
  Filled 2020-08-16 (×3): qty 1

## 2020-08-16 MED ORDER — MIDAZOLAM HCL 5 MG/5ML IJ SOLN
INTRAMUSCULAR | Status: DC | PRN
  Administered 2020-08-16: 2 mg via INTRAVENOUS

## 2020-08-16 MED ORDER — METOCLOPRAMIDE HCL 5 MG PO TABS: 5.0000 mg | ORAL_TABLET | Freq: Three times a day (TID) | ORAL | Status: DC | PRN

## 2020-08-16 MED ORDER — DIPHENHYDRAMINE HCL 12.5 MG/5ML PO ELIX
12.5000 mg | ORAL_SOLUTION | ORAL | Status: DC | PRN
Start: 2020-08-16 — End: 2020-08-17

## 2020-08-16 MED ORDER — PROPOFOL 10 MG/ML IV BOLUS
INTRAVENOUS | Status: AC
  Filled 2020-08-16: qty 20

## 2020-08-16 MED ORDER — CLONAZEPAM 1 MG PO TABS
1.0000 mg | ORAL_TABLET | Freq: Four times a day (QID) | ORAL | Status: DC
  Administered 2020-08-16 – 2020-08-17 (×2): 1 mg via ORAL
  Filled 2020-08-16 (×2): qty 1

## 2020-08-16 MED ORDER — CLONIDINE HCL 0.1 MG PO TABS
0.1000 mg | ORAL_TABLET | Freq: Three times a day (TID) | ORAL | Status: DC
  Administered 2020-08-17: 0.1 mg via ORAL
  Filled 2020-08-16: qty 1

## 2020-08-16 MED ORDER — DOXEPIN HCL 10 MG PO CAPS
20.0000 mg | ORAL_CAPSULE | Freq: Every day | ORAL | Status: DC
  Administered 2020-08-16: 30 mg via ORAL
  Filled 2020-08-16: qty 3

## 2020-08-16 MED ORDER — BUPIVACAINE-EPINEPHRINE 0.25% -1:200000 IJ SOLN
INTRAMUSCULAR | Status: DC | PRN
  Administered 2020-08-16: 30 mL

## 2020-08-16 MED ORDER — MENTHOL 3 MG MT LOZG: 1.0000 | LOZENGE | OROMUCOSAL | Status: DC | PRN

## 2020-08-16 MED ORDER — ONDANSETRON HCL 4 MG/2ML IJ SOLN: 4.0000 mg | Freq: Four times a day (QID) | INTRAMUSCULAR | Status: DC | PRN

## 2020-08-16 SURGICAL SUPPLY — 60 items
BAG DECANTER FOR FLEXI CONT (MISCELLANEOUS) IMPLANT
BAG ZIPLOCK 12X15 (MISCELLANEOUS) IMPLANT
BLADE SURG SZ10 CARB STEEL (BLADE) IMPLANT
CHLORAPREP W/TINT 26 (MISCELLANEOUS) ×2 IMPLANT
COVER PERINEAL POST (MISCELLANEOUS) ×2 IMPLANT
COVER SURGICAL LIGHT HANDLE (MISCELLANEOUS) ×2 IMPLANT
COVER WAND RF STERILE (DRAPES) IMPLANT
CUP ACET PNNCL SECTR W/GRIP 56 (Hips) ×1 IMPLANT
DECANTER SPIKE VIAL GLASS SM (MISCELLANEOUS) ×2 IMPLANT
DERMABOND ADVANCED (GAUZE/BANDAGES/DRESSINGS) ×2
DERMABOND ADVANCED .7 DNX12 (GAUZE/BANDAGES/DRESSINGS) ×2 IMPLANT
DRAPE IMP U-DRAPE 54X76 (DRAPES) ×2 IMPLANT
DRAPE SHEET LG 3/4 BI-LAMINATE (DRAPES) ×6 IMPLANT
DRAPE STERI IOBAN 125X83 (DRAPES) IMPLANT
DRAPE U-SHAPE 47X51 STRL (DRAPES) ×4 IMPLANT
DRSG AQUACEL AG ADV 3.5X10 (GAUZE/BANDAGES/DRESSINGS) ×2 IMPLANT
ELECT REM PT RETURN 15FT ADLT (MISCELLANEOUS) ×2 IMPLANT
GAUZE SPONGE 4X4 12PLY STRL (GAUZE/BANDAGES/DRESSINGS) ×2 IMPLANT
GLOVE BIO SURGEON STRL SZ8.5 (GLOVE) ×4 IMPLANT
GLOVE SRG 8 PF TXTR STRL LF DI (GLOVE) ×1 IMPLANT
GLOVE SURG ENC TEXT LTX SZ7.5 (GLOVE) ×4 IMPLANT
GLOVE SURG UNDER POLY LF SZ8 (GLOVE) ×1
GLOVE SURG UNDER POLY LF SZ8.5 (GLOVE) ×2 IMPLANT
GOWN SPEC L3 XXLG W/TWL (GOWN DISPOSABLE) ×2 IMPLANT
GOWN STRL REUS W/ TWL LRG LVL3 (GOWN DISPOSABLE) ×1 IMPLANT
GOWN STRL REUS W/TWL LRG LVL3 (GOWN DISPOSABLE) ×1
HANDPIECE INTERPULSE COAX TIP (DISPOSABLE) ×1
HEAD CERAMIC DELTA 36 PLUS 1.5 (Hips) ×2 IMPLANT
HOLDER FOLEY CATH W/STRAP (MISCELLANEOUS) ×2 IMPLANT
HOOD PEEL AWAY FLYTE STAYCOOL (MISCELLANEOUS) ×8 IMPLANT
JET LAVAGE IRRISEPT WOUND (IRRIGATION / IRRIGATOR) ×2
KIT TURNOVER KIT A (KITS) ×2 IMPLANT
LAVAGE JET IRRISEPT WOUND (IRRIGATION / IRRIGATOR) ×1 IMPLANT
MANIFOLD NEPTUNE II (INSTRUMENTS) ×2 IMPLANT
MARKER SKIN DUAL TIP RULER LAB (MISCELLANEOUS) ×2 IMPLANT
NDL SAFETY ECLIPSE 18X1.5 (NEEDLE) ×2 IMPLANT
NEEDLE HYPO 18GX1.5 SHARP (NEEDLE) ×2
NEEDLE SPNL 18GX3.5 QUINCKE PK (NEEDLE) ×2 IMPLANT
PACK ANTERIOR HIP CUSTOM (KITS) ×2 IMPLANT
PENCIL SMOKE EVACUATOR (MISCELLANEOUS) IMPLANT
PINN SECTOR W/GRIP ACE CUP 56 (Hips) ×2 IMPLANT
PINNACLE ALTRX PLUS 4 N 36X56 (Hips) ×2 IMPLANT
SAW OSC TIP CART 19.5X105X1.3 (SAW) ×2 IMPLANT
SEALER BIPOLAR AQUA 6.0 (INSTRUMENTS) ×2 IMPLANT
SET HNDPC FAN SPRY TIP SCT (DISPOSABLE) ×1 IMPLANT
STEM TRI LOC BPS GRIP SZ11 (Hips) ×1 IMPLANT
SUT ETHIBOND NAB CT1 #1 30IN (SUTURE) ×4 IMPLANT
SUT MNCRL AB 3-0 PS2 18 (SUTURE) ×2 IMPLANT
SUT MNCRL AB 4-0 PS2 18 (SUTURE) ×2 IMPLANT
SUT MON AB 2-0 CT1 36 (SUTURE) ×4 IMPLANT
SUT STRATAFIX PDO 1 14 VIOLET (SUTURE) ×1
SUT STRATFX PDO 1 14 VIOLET (SUTURE) ×1
SUT VIC AB 2-0 CT1 27 (SUTURE) ×1
SUT VIC AB 2-0 CT1 TAPERPNT 27 (SUTURE) ×1 IMPLANT
SUTURE STRATFX PDO 1 14 VIOLET (SUTURE) ×1 IMPLANT
SYR 3ML LL SCALE MARK (SYRINGE) ×4 IMPLANT
TRAY FOLEY MTR SLVR 16FR STAT (SET/KITS/TRAYS/PACK) IMPLANT
TRI LOC BPS W/GRIP SZ11 (Hips) ×2 IMPLANT
TUBE SUCTION HIGH CAP CLEAR NV (SUCTIONS) ×2 IMPLANT
WATER STERILE IRR 1000ML POUR (IV SOLUTION) ×2 IMPLANT

## 2020-08-16 NOTE — Plan of Care (Signed)
Discussed with patient and husband about plan of care for post-op day 0. ° ° °Will continue to monitor patient.  ° ° °SWhittemore, RN ° °

## 2020-08-16 NOTE — Anesthesia Postprocedure Evaluation (Signed)
Anesthesia Post Note  Patient: Kelli Navarro  Procedure(s) Performed: TOTAL HIP ARTHROPLASTY ANTERIOR APPROACH (Right Hip)     Patient location during evaluation: PACU Anesthesia Type: Spinal Level of consciousness: oriented and awake and alert Pain management: pain level controlled Vital Signs Assessment: post-procedure vital signs reviewed and stable Respiratory status: spontaneous breathing, respiratory function stable and nonlabored ventilation Cardiovascular status: blood pressure returned to baseline and stable Postop Assessment: no headache, no backache, no apparent nausea or vomiting and spinal receding Anesthetic complications: no   No complications documented.  Last Vitals:  Vitals:   08/16/20 1700 08/16/20 1740  BP: 95/60 108/65  Pulse: 76 72  Resp: 13 16  Temp: 36.6 C   SpO2: 100% 100%    Last Pain:  Vitals:   08/16/20 1740  TempSrc:   PainSc: 8                  Lidia Collum

## 2020-08-16 NOTE — Op Note (Signed)
OPERATIVE REPORT  SURGEON: Rod Can, MD   ASSISTANT: Nehemiah Massed, PA-C.  PREOPERATIVE DIAGNOSIS: Right femoral neck fracture.   POSTOPERATIVE DIAGNOSIS: Right femoral neck fracture.    PROCEDURE: Right total hip arthroplasty, anterior approach.   IMPLANTS: DePuy Tri Lock stem, size 11, hi offset. DePuy Pinnacle Cup, size 56 mm. DePuy Altrx liner, size 36 by 56 mm, +4 neutral. DePuy Biolox ceramic head ball, size 36 + 1.5 mm.  ANESTHESIA:  MAC and Spinal  ESTIMATED BLOOD LOSS:-250 mL    ANTIBIOTICS: 2g Ancef.  DRAINS: None.  COMPLICATIONS: None.   CONDITION: PACU - hemodynamically stable.   BRIEF CLINICAL NOTE: Kelli Navarro is a 70 y.o. female with a subacute Right femoral neck fracture. After failing conservative management, the patient was indicated for total hip arthroplasty. The risks, benefits, and alternatives to the procedure were explained, and the patient elected to proceed.  PROCEDURE IN DETAIL: Surgical site was marked by myself in the pre-op holding area. Once inside the operating room, spinal anesthesia was obtained, and a foley catheter was inserted. The patient was then positioned on the [place] table.  All bony prominences were well padded.  The hip was prepped and draped in the normal sterile surgical fashion.  A time-out was called verifying side and site of surgery. The patient received IV antibiotics within 60 minutes of beginning the procedure.   The direct anterior approach to the hip was performed through the Hueter interval.  Lateral femoral circumflex vessels were treated with the Auqumantys. The anterior capsule was exposed and an inverted T capsulotomy was made. The subcapital femoral neck fracture was identified. The femoral neck cut was made to the level of the templated cut.  A corkscrew was placed into the head and the head was removed.  The femoral head was found to have eburnated bone. The head was passed to the back table and was  measured.   Acetabular exposure was achieved, and the pulvinar and labrum were excised. Sequential reaming of the acetabulum was then performed up to a size 55 mm reamer. A 56 mm cup was then opened and impacted into place at approximately 40 degrees of abduction and 20 degrees of anteversion. The final polyethylene liner was impacted into place and acetabular osteophytes were removed.    I then gained femoral exposure taking care to protect the abductors and greater trochanter.  This was performed using standard external rotation, extension, and adduction.  The capsule was peeled off the inner aspect of the greater trochanter, taking care to preserve the short external rotators. A cookie cutter was used to enter the femoral canal, and then the femoral canal finder was placed.  Sequential broaching was performed up to a size 11.  Calcar planer was used on the femoral neck remnant.  I placed a hi offset neck and a trial head ball.  The hip was reduced.  Leg lengths and offset were checked fluoroscopically.  The hip was dislocated and trial components were removed.  The final implants were placed, and the hip was reduced.  Fluoroscopy was used to confirm component position and leg lengths.  At 90 degrees of external rotation and full extension, the hip was stable to an anterior directed force.   The wound was copiously irrigated with Irrisept solution and normal saline using pule lavage.  Marcaine solution was injected into the periarticular soft tissue.  The wound was closed in layers using #1 Stratafix for the fascia, 2-0 Vicryl for the subcutaneous fat, 2-0 Monocryl  for the deep dermal layer, 3-0 running Monocryl subcuticular stitch, and Dermabond for the skin.  Once the glue was fully dried, an Aquacell Ag dressing was applied.  The patient was transported to the recovery room in stable condition.  Sponge, needle, and instrument counts were correct at the end of the case x2.  The patient tolerated the  procedure well and there were no known complications.  Please note that a surgical assistant was a medical necessity for this procedure to perform it in a safe and expeditious manner. Assistant was necessary to provide appropriate retraction of vital neurovascular structures, to prevent femoral fracture, and to allow for anatomic placement of the prosthesis.

## 2020-08-16 NOTE — Anesthesia Procedure Notes (Signed)
Date/Time: 08/16/2020 12:46 PM Performed by: Sharlette Dense, CRNA Oxygen Delivery Method: Simple face mask

## 2020-08-16 NOTE — H&P (Signed)
PREOPERATIVE H&P  Chief Complaint: Right femoral neck fracture  HPI: Kelli Navarro is a 70 y.o. female who presents for preoperative history and physical with a diagnosis of Right femoral neck fracture. She fell 2 weeks ago and has been unable to weight bear. She has been using a wheelchair and walker.  This is significantly impairing activities of daily living.  She has elected for surgical management.   Past Medical History:  Diagnosis Date  . ADHD   . Anxiety   . Chronic back pain    from broken back  . Depression   . Right femoral fracture ([place])   . [facility] spotted fever    may show low Hgb  . Seizures ([place])    has during sleep  . Vitamin B 12 deficiency    Past Surgical History:  Procedure Laterality Date  . BACK SURGERY    . none    . radial frequency ablasion  05/2019   back  . TOTAL HIP ARTHROPLASTY Left 03/22/2017   Procedure: TOTAL HIP ARTHROPLASTY ANTERIOR APPROACH;  Surgeon: Rod Can, MD;  Location: [facility];  Service: Orthopedics;  Laterality: Left;   Social History   Socioeconomic History  . Marital status: Married    Spouse name: Not on file  . Number of children: 2  . Years of education: 28  . Highest education level: Not on file  Occupational History  . Occupation: retired  Tobacco Use  . Smoking status: Never Smoker  . Smokeless tobacco: Never Used  Vaping Use  . Vaping Use: Never used  Substance and Sexual Activity  . Alcohol use: No  . Drug use: No  . Sexual activity: Not on file  Other Topics Concern  . 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 Multiple sclerosis Mother   . Heart Problems Mother        by-pass  . Alcohol abuse Mother   . Heart Problems Sister        pacemaker   Allergies  Allergen Reactions  . 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 Aptiom [Eslicarbazepine] Other (See Comments)    Unknown reaction  . Azithromycin Other (See Comments)    Unknown reaction   . Bupropion Other (See Comments)     Caused depression  . Carbamazepine Other (See Comments)    Unknown reaction   . Dilantin [Phenytoin Sodium Extended] Other (See Comments)    Unknown reaction   . Diphenhydramine Other (See Comments)    Feels like "spiders are crawling" on her  . Divalproex Sodium Other (See Comments)    Unknown reaction   . Keppra [Levetiracetam] Other (See Comments)    Unknown reaction   . Lamictal [Lamotrigine] Other (See Comments)    Unknown reaction   . Lorazepam Swelling and Other (See Comments)    Hallucinations  . Lyrica [Pregabalin] Other (See Comments)    Pt reports feeling loopy and  confused  . Morphine And Related Other (See Comments)    Unknown reaction - (high doses)  . Prednisone Other (See Comments)    Unknown reaction   . Vimpat [Lacosamide] Other (See Comments)    Unknown reaction    Prior to Admission medications   Medication Sig Start Date End Date Taking? Authorizing Provider  acetaminophen (TYLENOL) 500 MG tablet Take 1,000 mg by mouth every 6 (six) hours as needed for headache (pain).   Yes [provider]  amphetamine-dextroamphetamine (ADDERALL) 20 MG tablet Take 20 mg by mouth 3 (three) times daily.  06/05/17  Yes [provider]  aspirin 81 MG tablet Take 1 tablet (81 mg total) by mouth 2 (two) times daily. 03/27/17  Yes Patrecia Pour, MD  clonazePAM (KLONOPIN) 2 MG tablet Take 1/2 tablet 4 times a day Patient taking differently: Take 1 mg by mouth 4 (four) times daily. 05/07/20  Yes Cameron Sprang, MD  cloNIDine (CATAPRES) 0.1 MG tablet TAKE 1 TABLET BY MOUTH THREE TIMES A DAY Patient taking differently: Take 0.1 mg by mouth 3 (three) times daily. 07/26/20  Yes Thayer Headings, PMHNP  doxepin (SINEQUAN) 10 MG capsule TAKE 2 -3 CAPS BY MOUTH AT BEDTIME Patient taking differently: Take 20-30 mg by mouth at bedtime. 06/05/20  Yes Thayer Headings, PMHNP  gabapentin (NEURONTIN) 100 MG capsule Take 400 mg by mouth 3 (three) times daily. 01/03/20  Yes [provider]  Multiple Vitamin (MULTIVITAMIN WITH MINERALS) TABS tablet Take 1 tablet by mouth daily.   Yes [provider]  topiramate (TOPAMAX) 100 MG tablet Take 2 tabs twice a day Patient taking differently: Take 200 mg by mouth 2 (two) times daily. 05/07/20  Yes Cameron Sprang, MD  ipratropium (ATROVENT) 0.06 % nasal spray Place 2 sprays into both nostrils at bedtime. 10/11/19   [provider]     Positive ROS: All other systems have been reviewed and were otherwise negative with the exception of those mentioned in the HPI and as above.  Physical Exam: General: Alert, no acute distress Cardiovascular: No pedal edema Respiratory: No cyanosis, no use of accessory musculature GI: No organomegaly, abdomen is soft and non-tender Skin: No lesions in the area of chief complaint Neurologic: Sensation intact distally Psychiatric: Patient is competent for consent with normal mood and affect Lymphatic: No axillary or cervical lymphadenopathy  MUSCULOSKELETAL: R hip skin intact. Mild shortening. Pain with logroll. Unable to SLR. NVI.  Assessment: Right femoral neck fracture  Plan: Plan for Procedure(s): TOTAL HIP ARTHROPLASTY ANTERIOR APPROACH  The risks, benefits, and alternatives were discussed with the patient. There are risks associated with the surgery including, but not limited to, problems with anesthesia (death), infection, instability (giving out of the joint), dislocation, differences in leg  length/angulation/rotation, fracture of bones, loosening or failure of implants, hematoma (blood accumulation) which may require surgical drainage, blood clots, pulmonary embolism, nerve injury (foot drop and lateral thigh numbness), and blood vessel injury. The patient understands these risks and elects to proceed.   Bertram Savin, MD [phone]   08/16/2020 12:27 PM

## 2020-08-16 NOTE — Plan of Care (Signed)
  Problem: Education: Goal: Knowledge of General Education information will improve Description: Including pain rating scale, medication(s)/side effects and non-pharmacologic comfort measures Outcome: Progressing   Problem: Activity: Goal: Risk for activity intolerance will decrease Outcome: Progressing   

## 2020-08-16 NOTE — Discharge Instructions (Signed)
°Dr. Anah Billard °Joint Replacement Specialist °[facility] °[address] °[place], [place] [address] °[phone] ° ° °TOTAL HIP REPLACEMENT POSTOPERATIVE DIRECTIONS ° ° ° °Hip Rehabilitation, Guidelines Following Surgery  ° °WEIGHT BEARING °Weight bearing as tolerated with assist device (walker, cane, etc) as directed, use it as long as suggested by your surgeon or therapist, typically at least 4-6 weeks. ° °The results of a hip operation are greatly improved after range of motion and muscle strengthening exercises. Follow all safety measures which are given to protect your hip. If any of these exercises cause increased pain or swelling in your joint, decrease the amount until you are comfortable again. Then slowly increase the exercises. Call your caregiver if you have problems or questions.  ° °HOME CARE INSTRUCTIONS  °Most of the following instructions are designed to prevent the dislocation of your new hip.  °Remove items at home which could result in a fall. This includes throw rugs or furniture in walking pathways.  °Continue medications as instructed at time of discharge. °· You may have some home medications which will be placed on hold until you complete the course of blood thinner medication. °· You may start showering once you are discharged home. Do not remove your dressing. °Do not put on socks or shoes without following the instructions of your caregivers.   °Sit on chairs with arms. Use the chair arms to help push yourself up when arising.  °Arrange for the use of a toilet seat elevator so you are not sitting low.  °· Walk with walker as instructed.  °You may resume a sexual relationship in one month or when given the OK by your caregiver.  °Use walker as long as suggested by your caregivers.  °You may put full weight on your legs and walk as much as is comfortable. °Avoid periods of inactivity such as sitting longer than an hour when not asleep. This helps prevent  blood clots.  °You may return to work once you are cleared by your surgeon.  °Do not drive a car for 6 weeks or until released by your surgeon.  °Do not drive while taking narcotics.  °Wear elastic stockings for two weeks following surgery during the day but you may remove then at night.  °Make sure you keep all of your appointments after your operation with all of your doctors and caregivers. You should call the office at the above phone number and make an appointment for approximately two weeks after the date of your surgery. °Please pick up a stool softener and laxative for home use as long as you are requiring pain medications. °· ICE to the affected hip every three hours for 30 minutes at a time and then as needed for pain and swelling. Continue to use ice on the hip for pain and swelling from surgery. You may notice swelling that will progress down to the foot and ankle.  This is normal after surgery.  Elevate the leg when you are not up walking on it.   °It is important for you to complete the blood thinner medication as prescribed by your doctor. °· Continue to use the breathing machine which will help keep your temperature down.  It is common for your temperature to cycle up and down following surgery, especially at night when you are not up moving around and exerting yourself.  The breathing machine keeps your lungs expanded and your temperature down. ° °RANGE OF MOTION AND STRENGTHENING EXERCISES  °These exercises are   designed to help you keep full movement of your hip joint. Follow your caregiver's or physical therapist's instructions. Perform all exercises about fifteen times, three times per day or as directed. Exercise both hips, even if you have had only one joint replacement. These exercises can be done on a training (exercise) mat, on the floor, on a table or on a bed. Use whatever works the best and is most comfortable for you. Use music or television while you are exercising so that the exercises  are a pleasant break in your day. This will make your life better with the exercises acting as a break in routine you can look forward to.  °Lying on your back, slowly slide your foot toward your buttocks, raising your knee up off the floor. Then slowly slide your foot back down until your leg is straight again.  °Lying on your back spread your legs as far apart as you can without causing discomfort.  °Lying on your side, raise your upper leg and foot straight up from the floor as far as is comfortable. Slowly lower the leg and repeat.  °Lying on your back, tighten up the muscle in the front of your thigh (quadriceps muscles). You can do this by keeping your leg straight and trying to raise your heel off the floor. This helps strengthen the largest muscle supporting your knee.  °Lying on your back, tighten up the muscles of your buttocks both with the legs straight and with the knee bent at a comfortable angle while keeping your heel on the floor.  ° °SKILLED REHAB INSTRUCTIONS: °If the patient is transferred to a skilled rehab facility following release from the hospital, a list of the current medications will be sent to the facility for the patient to continue.  When discharged from the skilled rehab facility, please have the facility set up the patient's Home Health Physical Therapy prior to being released. Also, the skilled facility will be responsible for providing the patient with their medications at time of release from the facility to include their pain medication and their blood thinner medication. If the patient is still at the rehab facility at time of the two week follow up appointment, the skilled rehab facility will also need to assist the patient in arranging follow up appointment in our office and any transportation needs. ° °MAKE SURE YOU:  °Understand these instructions.  °Will watch your condition.  °Will get help right away if you are not doing well or get worse. ° °Pick up stool softner and  laxative for home use following surgery while on pain medications. °Do not remove your dressing. °The dressing is waterproof--it is OK to take showers. °Continue to use ice for pain and swelling after surgery. °Do not use any lotions or creams on the incision until instructed by your surgeon. °Total Hip Protocol. ° ° °

## 2020-08-16 NOTE — Transfer of Care (Signed)
Immediate Anesthesia Transfer of Care Note  Patient: Kelli Navarro  Procedure(s) Performed: TOTAL HIP ARTHROPLASTY ANTERIOR APPROACH (Right Hip)  Patient Location: PACU  Anesthesia Type:Spinal  Level of Consciousness: awake  Airway & Oxygen Therapy: Patient Spontanous Breathing and Patient connected to face mask oxygen  Post-op Assessment: Report given to RN and Post -op Vital signs reviewed and stable  Post vital signs: Reviewed and stable  Last Vitals:  Vitals Value Taken Time  BP 80/56 08/16/20 1510  Temp    Pulse 66 08/16/20 1510  Resp 9 08/16/20 1510  SpO2 100 % 08/16/20 1510  Vitals shown include unvalidated device data.  Last Pain:  Vitals:   08/16/20 1218  TempSrc: Oral  PainSc:          Complications: No complications documented.

## 2020-08-16 NOTE — Anesthesia Preprocedure Evaluation (Signed)
Anesthesia Evaluation  Patient identified by MRN, date of birth, ID band Patient awake    Reviewed: Allergy & Precautions, NPO status , Patient's Chart, lab work & pertinent test results  History of Anesthesia Complications Negative for: history of anesthetic complications  Airway Mallampati: II  TM Distance: >3 FB Neck ROM: Full    Dental   Pulmonary neg pulmonary ROS,    Pulmonary exam normal        Cardiovascular hypertension, Normal cardiovascular exam     Neuro/Psych Seizures -,  Anxiety Depression    GI/Hepatic negative GI ROS, Neg liver ROS,   Endo/Other  negative endocrine ROS  Renal/GU negative Renal ROS  negative genitourinary   Musculoskeletal negative musculoskeletal ROS (+)   Abdominal   Peds  Hematology negative hematology ROS (+)   Anesthesia Other Findings   Reproductive/Obstetrics                            Anesthesia Physical Anesthesia Plan  ASA: II  Anesthesia Plan: Spinal   Post-op Pain Management:    Induction:   PONV Risk Score and Plan: 2 and Propofol infusion, Treatment may vary due to age or medical condition, Ondansetron and TIVA  Airway Management Planned: Nasal Cannula and Simple Face Mask  Additional Equipment: None  Intra-op Plan:   Post-operative Plan:   Informed Consent: I have reviewed the patients History and Physical, chart, labs and discussed the procedure including the risks, benefits and alternatives for the proposed anesthesia with the patient or authorized representative who has indicated his/her understanding and acceptance.       Plan Discussed with:   Anesthesia Plan Comments:         Anesthesia Quick Evaluation

## 2020-08-17 ENCOUNTER — Ambulatory Visit: Payer: Self-pay | Admitting: Student

## 2020-08-17 ENCOUNTER — Encounter (HOSPITAL_COMMUNITY): Payer: Self-pay | Admitting: Orthopedic Surgery

## 2020-08-17 DIAGNOSIS — S72011A Unspecified intracapsular fracture of right femur, initial encounter for closed fracture: Secondary | ICD-10-CM | POA: Diagnosis not present

## 2020-08-17 LAB — CBC
HCT: 26.8 % — ABNORMAL LOW (ref 36.0–46.0)
Hemoglobin: 8 g/dL — ABNORMAL LOW (ref 12.0–15.0)
MCH: 27.2 pg (ref 26.0–34.0)
MCHC: 29.9 g/dL — ABNORMAL LOW (ref 30.0–36.0)
MCV: 91.2 fL (ref 80.0–100.0)
Platelets: 176 10*3/uL (ref 150–400)
RBC: 2.94 MIL/uL — ABNORMAL LOW (ref 3.87–5.11)
RDW: 14.5 % (ref 11.5–15.5)
WBC: 3.8 10*3/uL — ABNORMAL LOW (ref 4.0–10.5)
nRBC: 0 % (ref 0.0–0.2)

## 2020-08-17 LAB — BASIC METABOLIC PANEL
Anion gap: 11 (ref 5–15)
BUN: 11 mg/dL (ref 8–23)
CO2: 21 mmol/L — ABNORMAL LOW (ref 22–32)
Calcium: 8 mg/dL — ABNORMAL LOW (ref 8.9–10.3)
Chloride: 113 mmol/L — ABNORMAL HIGH (ref 98–111)
Creatinine, Ser: 0.83 mg/dL (ref 0.44–1.00)
GFR, Estimated: >60 mL/min (ref >60)
Glucose, Bld: 108 mg/dL — ABNORMAL HIGH (ref 70–99)
Potassium: 3.7 mmol/L (ref 3.5–5.1)
Sodium: 145 mmol/L (ref 135–145)

## 2020-08-17 MED ORDER — HYDROCODONE-ACETAMINOPHEN 5-325 MG PO TABS: 1.0000 | ORAL_TABLET | ORAL | 0 refills | Status: DC | PRN

## 2020-08-17 MED ORDER — DOCUSATE SODIUM 100 MG PO CAPS: 100.0000 mg | ORAL_CAPSULE | Freq: Two times a day (BID) | ORAL | 1 refills | Status: DC

## 2020-08-17 MED ORDER — ONDANSETRON HCL 4 MG PO TABS: 4.0000 mg | ORAL_TABLET | Freq: Four times a day (QID) | ORAL | 0 refills | Status: DC | PRN

## 2020-08-17 MED ORDER — ASPIRIN 81 MG PO CHEW: 81.0000 mg | CHEWABLE_TABLET | Freq: Two times a day (BID) | ORAL | 0 refills | Status: AC

## 2020-08-17 MED ORDER — SENNA 8.6 MG PO TABS: 2.0000 | ORAL_TABLET | Freq: Every day | ORAL | 1 refills | Status: DC

## 2020-08-17 NOTE — TOC Transition Note (Signed)
Transition of Care [facility]) - CM/SW Discharge Note   Patient Details  Name: Kelli Navarro MRN: [id number] Date of Birth: [birth year]  [facility] CM/SW Contact:  Lia Hopping, LCSW Phone Number: 08/17/2020, 12:15 PM   Clinical Narrative:    Therapy Plan: HEP Patient confirm she has DME RW and elevated toilet seat.   Final next level of care: Home/Self Care   Patient Goals and CMS Choice        Discharge Placement                      Discharge Plan and Services                                    Social Determinants of Health (SDOH) Interventions   Readmission Risk Interventions No flowsheet data found.

## 2020-08-17 NOTE — Evaluation (Signed)
Physical Therapy Evaluation Patient Details Name: Kelli Navarro MRN: [id number] DOB: 1951-06-08 Today's Date: 08/17/2020   History of Present Illness  Pt s/p R hip fx and now s/p R THR by anterior direct approach.  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  Pt should progress to dc home with family assist.    Follow Up Recommendations Follow surgeon's recommendation for DC plan and follow-up therapies    Equipment Recommendations  None recommended by PT    Recommendations for Other Services       Precautions / Restrictions Precautions Precautions: Fall Restrictions Weight Bearing Restrictions: No RLE Weight Bearing: Weight bearing as tolerated      Mobility  Bed Mobility Overal bed mobility: Needs Assistance Bed Mobility: Supine to Sit     Supine to sit: Min assist     General bed mobility comments: cues for sequence and use of L LE to self assist    Transfers Overall transfer level: Needs assistance Equipment used: Rolling walker (2 wheeled) Transfers: Sit to/from Stand Sit to Stand: Min assist         General transfer comment: cues for LE management and use of UEs to self assist  Ambulation/Gait Ambulation/Gait assistance: Min assist Gait Distance (Feet): 90 Feet Assistive device: Rolling walker (2 wheeled) Gait Pattern/deviations: Step-to pattern;Decreased step length - right;Decreased step length - left;Shuffle;Decreased stance time - right Gait velocity: decr   General Gait Details: cues for sequence, posture, position from [employer]            Wheelchair Mobility    Modified Rankin (Stroke Patients Only)       Balance Overall balance assessment: Needs assistance Sitting-balance support: No upper extremity supported;Feet supported Sitting balance-Leahy Scale: Good      Standing balance support: Bilateral upper extremity supported Standing balance-Leahy Scale: Poor                               Pertinent Vitals/Pain Pain Assessment: 0-10 Pain Score: 5  Pain Location: R hip Pain Descriptors / Indicators: Aching;Sore Pain Intervention(s): Limited activity within patient's tolerance;Monitored during session;Premedicated before session    [place] expects to be discharged to:: Private residence Living Arrangements: Spouse/significant other Available Help at Discharge: Family;Available 24 hours/day Type of Home: House Home Access: Stairs to enter   [employer] of Steps: 7 Home Layout: Two level;Bed/bath upstairs Home Equipment: Bedside commode;Cane - single point;Shower seat;Walker - 2 wheels      Prior Function Level of Independence: Independent               Hand Dominance        Extremity/Trunk Assessment   Upper Extremity Assessment Upper Extremity Assessment: Overall WFL for tasks assessed    Lower Extremity Assessment Lower Extremity Assessment: RLE deficits/detail RLE Deficits / Details: 2+/5 strength at hip with AAROM at hip to 85 flex and 15 abd    Cervical / Trunk Assessment Cervical / Trunk Assessment: Normal  Communication   Communication: No difficulties  Cognition Arousal/Alertness: Awake/alert Behavior During Therapy: WFL for tasks assessed/performed Overall Cognitive Status: Within Functional Limits for tasks assessed  General Comments      Exercises Total Joint Exercises Ankle Circles/Pumps: AROM;Both;15 reps;Supine Quad Sets: AROM;Both;10 reps;Supine Heel Slides: AAROM;Right;20 reps;Supine Hip ABduction/ADduction: AAROM;Right;15 reps;Supine   Assessment/Plan    PT Assessment Patient needs continued PT services  PT Problem List Decreased strength;Decreased range of motion;Decreased activity  tolerance;Decreased balance;Decreased mobility;Decreased knowledge of use of DME;Pain       PT Treatment Interventions DME instruction;Gait training;Stair training;Functional mobility training;Therapeutic activities;Therapeutic exercise;Patient/family education    PT Goals (Current goals can be found in the Care Plan section)  Acute Rehab PT Goals Patient Stated Goal: Regain IND PT Goal Formulation: With patient Time For Goal Achievement: 08/24/20 Potential to Achieve Goals: Good    Frequency 7X/week   Barriers to discharge        Co-evaluation               AM-PAC PT "6 Clicks" Mobility  Outcome Measure Help needed turning from your back to your side while in a flat bed without using bedrails?: A Little Help needed moving from lying on your back to sitting on the side of a flat bed without using bedrails?: A Little Help needed moving to and from a bed to a chair (including a wheelchair)?: A Little Help needed standing up from a chair using your arms (e.g., wheelchair or bedside chair)?: A Little Help needed to walk in hospital room?: A Little Help needed climbing 3-5 steps with a railing? : A Lot 6 Click Score: 17    End of Session Equipment Utilized During Treatment: Gait belt Activity Tolerance: Patient tolerated treatment well Patient left: in chair;with call bell/phone within reach;with chair alarm set Nurse Communication: Mobility status PT Visit Diagnosis: Unsteadiness on feet (R26.81);Difficulty in walking, not elsewhere classified (R26.2)    Time: 4327-6147 PT Time Calculation (min) (ACUTE ONLY): 39 min   Charges:   PT Evaluation $PT Eval Low Complexity: 1 Low PT Treatments $Gait Training: 8-22 mins $Therapeutic Exercise: 8-22 mins        Debe Coder PT Acute Rehabilitation Services Pager [phone] Office [phone]   [facility] 08/17/2020, 12:49 PM

## 2020-08-17 NOTE — Plan of Care (Signed)
Patient discharged home in stable condition 

## 2020-08-17 NOTE — Progress Notes (Signed)
    Subjective:  Patient reports pain as mild to moderate.  Denies N/V/CP/SOB. No c/o. Wants to go home  Objective:   VITALS:   Vitals:   08/16/20 2152 08/17/20 0121 08/17/20 0638 08/17/20 1314  BP: 107/64 96/60 (!) 108/58 109/71  Pulse: 80 73 90 98  Resp: 15 16 16    Temp: 97.8 F (36.6 C) 98.2 F (36.8 C) 99.3 F (37.4 C) 98.6 F (37 C)  TempSrc: Oral Oral Oral Oral  SpO2: 100% 100% 100% 95%  Weight:      Height:        NAD ABD soft Sensation intact distally Intact pulses distally Dorsiflexion/Plantar flexion intact Incision: dressing C/D/I Compartment soft   Lab Results  Component Value Date   WBC 3.8 (L) 08/17/2020   HGB 8.0 (L) 08/17/2020   HCT 26.8 (L) 08/17/2020   MCV 91.2 08/17/2020   PLT 176 08/17/2020   BMET    Component Value Date/Time   NA 145 08/17/2020 0320   K 3.7 08/17/2020 0320   CL 113 (H) 08/17/2020 0320   CO2 21 (L) 08/17/2020 0320   GLUCOSE 108 (H) 08/17/2020 0320   BUN 11 08/17/2020 0320   CREATININE 0.83 08/17/2020 0320   CREATININE 0.84 01/05/2020 1607   CALCIUM 8.0 (L) 08/17/2020 0320   GFRNONAA >60 08/17/2020 0320   GFRNONAA >60 01/05/2020 1607   GFRAA >60 04/07/2020 1527   GFRAA >60 01/05/2020 1607     Assessment/Plan: 1 Day Post-Op   Principal Problem:   Fracture of femoral neck, right (HCC) Active Problems:   Displaced fracture of right femoral neck (HCC)   WBAT with walker DVT ppx: Aspirin, SCDs, TEDS PO pain control PT/OT Dispo: d/c home with HEP   Kelli Navarro 08/17/2020, 1:42 PM   Rod Can, MD [phone] [place] is now [facility]  [place] [address], [address], [place],  [address] Phone: [phone] [email or web address] Facebook  [employer]

## 2020-08-17 NOTE — Progress Notes (Signed)
Physical Therapy Treatment Patient Details Name: Kelli Navarro MRN: [id number] DOB: [birth year] Today's Date: 08/17/2020    History of Present Illness Pt s/p R hip fx and now s/p R THR by anterior direct approach.  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  Spouse present to review HEP, stairs and car transfers.  Written HEP provided and reviewed.  Follow Up Recommendations  Follow surgeon's recommendation for DC plan and follow-up therapies     Equipment Recommendations  None recommended by PT    Recommendations for Other Services       Precautions / Restrictions Precautions Precautions: Fall Restrictions Weight Bearing Restrictions: No RLE Weight Bearing: Weight bearing as tolerated    Mobility  Bed Mobility               General bed mobility comments: pt up in chair and requests back to same    Transfers Overall transfer level: Needs assistance Equipment used: Rolling walker (2 wheeled) Transfers: Sit to/from Stand Sit to Stand: Min guard;Supervision         General transfer comment: cues for LE management and use of UEs to self assist  Ambulation/Gait Ambulation/Gait assistance: Min guard;Supervision Gait Distance (Feet): 100 Feet Assistive device: Rolling walker (2 wheeled) Gait Pattern/deviations: Step-to pattern;Decreased step length - right;Decreased step length - left;Shuffle;Decreased stance time - right Gait velocity: decr   General Gait Details: cues for sequence, posture, position from RW   Stairs Stairs: Yes Stairs assistance: Min guard Stair Management: One rail Right;Forwards;With cane;Step to pattern;Sideways Number of Stairs: 4 General stair comments: 2 steps sideways with single rail and 2 steps with rail and cane   Wheelchair Mobility    Modified Rankin (Stroke Patients Only)       Balance Overall balance assessment: Needs  assistance Sitting-balance support: No upper extremity supported;Feet supported Sitting balance-Leahy Scale: Good     Standing balance support: Bilateral upper extremity supported Standing balance-Leahy Scale: Poor                              Cognition Arousal/Alertness: Awake/alert Behavior During Therapy: WFL for tasks assessed/performed Overall Cognitive Status: Within Functional Limits for tasks assessed                                        Exercises Total Joint Exercises Ankle Circles/Pumps: AROM;Both;15 reps;Supine Quad Sets: AROM;Both;10 reps;Supine Heel Slides: AAROM;Right;20 reps;Supine Hip ABduction/ADduction: AAROM;Right;15 reps;Supine Long Arc Quad: AROM;Right;10 reps;Seated    General Comments        Pertinent Vitals/Pain Pain Assessment: 0-10 Pain Score: 4  Pain Location: R hip Pain Descriptors / Indicators: Aching;Sore Pain Intervention(s): Limited activity within patient's tolerance;Monitored during session;Premedicated before session;Ice applied    Home Living                      Prior Function            PT Goals (current goals can now be found in the care plan section) Acute Rehab PT Goals Patient Stated Goal: Regain IND PT Goal Formulation: With patient Time For Goal Achievement: 08/24/20 Potential to Achieve Goals: Good Progress towards PT goals: Progressing toward goals    Frequency  7X/week      PT Plan Current plan remains appropriate    Co-evaluation              AM-PAC PT "6 Clicks" Mobility   Outcome Measure  Help needed turning from your back to your side while in a flat bed without using bedrails?: A Little Help needed moving from lying on your back to sitting on the side of a flat bed without using bedrails?: A Little Help needed moving to and from a bed to a chair (including a wheelchair)?: A Little Help needed standing up from a chair using your arms (e.g., wheelchair or  bedside chair)?: A Little Help needed to walk in hospital room?: A Little Help needed climbing 3-5 steps with a railing? : A Lot 6 Click Score: 17    End of Session Equipment Utilized During Treatment: Gait belt Activity Tolerance: Patient tolerated treatment well Patient left: in chair;with call bell/phone within reach;with chair alarm set Nurse Communication: Mobility status PT Visit Diagnosis: Unsteadiness on feet (R26.81);Difficulty in walking, not elsewhere classified (R26.2)     Time: 1325-1410 PT Time Calculation (min) (ACUTE ONLY): 45 min  Charges:  $Gait Training: 8-22 mins $Therapeutic Exercise: 8-22 mins $Therapeutic Activity: 8-22 mins                     Debe Coder PT Acute Rehabilitation Services Pager [phone] Office [phone]    [facility] 08/17/2020, 4:22 PM

## 2020-08-17 NOTE — Discharge Summary (Signed)
Physician Discharge Summary  Patient ID: Kelli Navarro MRN: [id number] DOB/AGE: Apr 20, 1951 70 y.o.  Admit date: 08/16/2020 Discharge date: 08/17/2020  Admission Diagnoses:  Fracture of femoral neck, right [facility])  Discharge Diagnoses:  Principal Problem:   Fracture of femoral neck, right ([place]) Active Problems:   Displaced fracture of right femoral neck (HCC)   Past Medical History:  Diagnosis Date  . ADHD   . Anxiety   . Chronic back pain    from broken back  . Depression   . Right femoral fracture ([place])   . [facility] spotted fever    may show low Hgb  . Seizures ([place])    has during sleep  . Vitamin B 12 deficiency     Surgeries: Procedure(s): TOTAL HIP ARTHROPLASTY ANTERIOR APPROACH on 08/16/2020   Consultants (if any):   Discharged Condition: Improved  Hospital Course: Kelli Navarro is an 70 y.o. female who was admitted 08/16/2020 with a diagnosis of Fracture of femoral neck, right ([place]) and went to the operating room on 08/16/2020 and underwent the above named procedures.    She was given perioperative antibiotics:  Anti-infectives (From admission, onward)   Start     Dose/Rate Route Frequency Ordered Stop   08/16/20 1545  ceFAZolin (ANCEF) IVPB 2g/100 mL premix        2 g 200 mL/hr over 30 Minutes Intravenous Every 6 hours 08/16/20 1539 08/17/20 0214   08/16/20 1200  ceFAZolin (ANCEF) IVPB 2g/100 mL premix        2 g 200 mL/hr over 30 Minutes Intravenous On call to O.R. 08/16/20 1156 08/16/20 1259    .  She was given sequential compression devices, early ambulation, and ASA for DVT prophylaxis.  She benefited maximally from the hospital stay and there were no complications.    Recent vital signs:  Vitals:   08/17/20 0638 08/17/20 1314  BP: (!) 108/58 109/71  Pulse: 90 98  Resp: 16   Temp: 99.3 F (37.4 C) 98.6 F (37 C)  SpO2: 100% 95%    Recent laboratory studies:  Lab Results  Component Value Date   HGB 8.0 (L) 08/17/2020   HGB  11.4 (L) 08/15/2020   HGB 13.1 04/07/2020   Lab Results  Component Value Date   WBC 3.8 (L) 08/17/2020   PLT 176 08/17/2020   Lab Results  Component Value Date   INR 1.0 08/16/2020   Lab Results  Component Value Date   NA 145 08/17/2020   K 3.7 08/17/2020   CL 113 (H) 08/17/2020   CO2 21 (L) 08/17/2020   BUN 11 08/17/2020   CREATININE 0.83 08/17/2020   GLUCOSE 108 (H) 08/17/2020    Discharge Medications:   Allergies as of 08/17/2020      Reactions   Other Anaphylaxis   Allergy to steroids causing anaphylaxis     Aptiom [eslicarbazepine] Other (See Comments)   Unknown reaction   Azithromycin Other (See Comments)   Unknown reaction   Bupropion Other (See Comments)    Caused depression   Carbamazepine Other (See Comments)   Unknown reaction   Dilantin [phenytoin Sodium Extended] Other (See Comments)   Unknown reaction   Diphenhydramine Other (See Comments)   Feels like "spiders are crawling" on her   Divalproex Sodium Other (See Comments)   Unknown reaction   Keppra [levetiracetam] Other (See Comments)   Unknown reaction   Lamictal [lamotrigine] Other (See Comments)   Unknown reaction   Lorazepam Swelling, Other (See Comments)  Hallucinations   Lyrica [pregabalin] Other (See Comments)   Pt reports feeling loopy and confused   Morphine And Related Other (See Comments)   Unknown reaction - (high doses)   Prednisone Other (See Comments)   Unknown reaction   Vimpat [lacosamide] Other (See Comments)   Unknown reaction      Medication List    STOP taking these medications   acetaminophen 500 MG tablet Commonly known as: TYLENOL   aspirin 81 MG tablet Replaced by: aspirin 81 MG chewable tablet     TAKE these medications   amphetamine-dextroamphetamine 20 MG tablet Commonly known as: ADDERALL Take 20 mg by mouth 3 (three) times daily.   aspirin 81 MG chewable tablet Chew 1 tablet (81 mg total) by mouth 2 (two) times daily. Replaces: aspirin 81 MG  tablet   clonazePAM 2 MG tablet Commonly known as: KLONOPIN Take 1/2 tablet 4 times a day What changed:   how much to take  how to take this  when to take this  additional instructions   cloNIDine 0.1 MG tablet Commonly known as: CATAPRES TAKE 1 TABLET BY MOUTH THREE TIMES A DAY   docusate sodium 100 MG capsule Commonly known as: COLACE Take 1 capsule (100 mg total) by mouth 2 (two) times daily.   doxepin 10 MG capsule Commonly known as: SINEQUAN TAKE 2 -3 CAPS BY MOUTH AT BEDTIME What changed: See the new instructions.   gabapentin 100 MG capsule Commonly known as: NEURONTIN Take 400 mg by mouth 3 (three) times daily.   HYDROcodone-acetaminophen 5-325 MG tablet Commonly known as: NORCO/VICODIN Take 1 tablet by mouth every 4 (four) hours as needed for moderate pain (pain score 4-6).   ipratropium 0.06 % nasal spray Commonly known as: ATROVENT Place 2 sprays into both nostrils at bedtime.   multivitamin with minerals Tabs tablet Take 1 tablet by mouth daily.   ondansetron 4 MG tablet Commonly known as: ZOFRAN Take 1 tablet (4 mg total) by mouth every 6 (six) hours as needed for nausea.   senna 8.6 MG Tabs tablet Commonly known as: SENOKOT Take 2 tablets (17.2 mg total) by mouth at bedtime.   topiramate 100 MG tablet Commonly known as: TOPAMAX Take 2 tabs twice a day What changed:   how much to take  how to take this  when to take this  additional instructions       Diagnostic Studies: DG Pelvis Portable  Result Date: 08/16/2020 CLINICAL DATA:  Status post right total hip arthroplasty. EXAM: PORTABLE PELVIS 1-2 VIEWS COMPARISON:  None. FINDINGS: The right femoral and acetabular components appear to be well situated. Expected postoperative changes are noted in the surrounding soft tissues. IMPRESSION: Status post right total hip arthroplasty. Electronically Signed   By: Marijo Conception M.D.   On: 08/16/2020 15:20   DG C-Arm 1-60 Min-No  Report  Result Date: 08/16/2020 Fluoroscopy was utilized by the requesting physician.  No radiographic interpretation.   DG HIP OPERATIVE UNILAT W OR W/O PELVIS RIGHT  Result Date: 08/16/2020 CLINICAL DATA:  Total right hip arthroplasty. EXAM: OPERATIVE RIGHT HIP (WITH PELVIS IF PERFORMED) 2 VIEWS TECHNIQUE: Fluoroscopic spot image(s) were submitted for interpretation post-operatively. COMPARISON:  July 31, 2020. FINDINGS: Fluoro time: 14 seconds. Two C-arm fluoroscopic images were obtained intraoperatively and submitted for post operative interpretation. These images demonstrate surgical change of total right hip arthroplasty. Normal alignment. No unexpected findings. Partially imaged prior left total hip arthroplasty. Please see the performing provider's procedural report for further  detail. IMPRESSION: Right total hip arthroplasty. Electronically Signed   By: Margaretha Sheffield MD   On: 08/16/2020 14:35    Disposition: Discharge disposition: 01-Home or Self Care       Discharge Instructions    Call MD / Call 911   Complete by: As directed    If you experience chest pain or shortness of breath, CALL 911 and be transported to the hospital emergency room.  If you develope a fever above 101 F, pus (white drainage) or increased drainage or redness at the wound, or calf pain, call your surgeon's office.   Constipation Prevention   Complete by: As directed    Drink plenty of fluids.  Prune juice may be helpful.  You may use a stool softener, such as Colace (over the counter) 100 mg twice a day.  Use MiraLax (over the counter) for constipation as needed.   Diet - low sodium heart healthy   Complete by: As directed    Driving restrictions   Complete by: As directed    No driving for 6 weeks   Increase activity slowly as tolerated   Complete by: As directed    Lifting restrictions   Complete by: As directed    No lifting for 6 weeks   TED hose   Complete by: As directed    Use stockings  (TED hose) for 2 weeks on both leg(s).  You may remove them at night for sleeping.       Follow-up Information    Quinlan Vollmer, Aaron Edelman, MD. Schedule an appointment as soon as possible for a visit in 2 weeks.   Specialty: Orthopedic Surgery Why: For wound re-check Contact information: [address] [place] [address] [phone]                Signed: Hilton Cork Tresean Mattix 08/17/2020, 1:48 PM

## 2020-08-22 NOTE — Telephone Encounter (Signed)
Can I close this, do I just refuse since she should still have refills? Thanks

## 2020-08-22 NOTE — Telephone Encounter (Signed)
You can close it, he had refills

## 2020-08-27 ENCOUNTER — Other Ambulatory Visit: Payer: Medicare HMO

## 2020-09-08 ENCOUNTER — Inpatient Hospital Stay (HOSPITAL_COMMUNITY): Payer: Medicare HMO

## 2020-09-08 ENCOUNTER — Inpatient Hospital Stay (HOSPITAL_COMMUNITY)
Admission: EM | Admit: 2020-09-08 | Discharge: 2020-09-14 | DRG: 917 | Disposition: A | Discharge status: Other Acute Inpatient Hospital | Payer: Medicare HMO | Attending: Student in an Organized Health Care Education/Training Program | Admitting: Student in an Organized Health Care Education/Training Program

## 2020-09-08 ENCOUNTER — Emergency Department (HOSPITAL_COMMUNITY): Payer: Medicare HMO

## 2020-09-08 DIAGNOSIS — Z9151 Personal history of suicidal behavior: Secondary | ICD-10-CM | POA: Diagnosis not present

## 2020-09-08 DIAGNOSIS — F909 Attention-deficit hyperactivity disorder, unspecified type: Secondary | ICD-10-CM | POA: Diagnosis present

## 2020-09-08 DIAGNOSIS — G928 Other toxic encephalopathy: Secondary | ICD-10-CM | POA: Diagnosis present

## 2020-09-08 DIAGNOSIS — G43909 Migraine, unspecified, not intractable, without status migrainosus: Secondary | ICD-10-CM | POA: Diagnosis present

## 2020-09-08 DIAGNOSIS — R401 Stupor: Secondary | ICD-10-CM

## 2020-09-08 DIAGNOSIS — T465X2A Poisoning by other antihypertensive drugs, intentional self-harm, initial encounter: Secondary | ICD-10-CM | POA: Diagnosis present

## 2020-09-08 DIAGNOSIS — T424X2A Poisoning by benzodiazepines, intentional self-harm, initial encounter: Secondary | ICD-10-CM | POA: Diagnosis present

## 2020-09-08 DIAGNOSIS — F32A Depression, unspecified: Secondary | ICD-10-CM | POA: Diagnosis not present

## 2020-09-08 DIAGNOSIS — E860 Dehydration: Secondary | ICD-10-CM | POA: Diagnosis not present

## 2020-09-08 DIAGNOSIS — M549 Dorsalgia, unspecified: Secondary | ICD-10-CM | POA: Diagnosis present

## 2020-09-08 DIAGNOSIS — T43012A Poisoning by tricyclic antidepressants, intentional self-harm, initial encounter: Principal | ICD-10-CM | POA: Diagnosis present

## 2020-09-08 DIAGNOSIS — G894 Chronic pain syndrome: Secondary | ICD-10-CM | POA: Diagnosis present

## 2020-09-08 DIAGNOSIS — R000 Tachycardia, unspecified: Secondary | ICD-10-CM | POA: Diagnosis not present

## 2020-09-08 DIAGNOSIS — Z66 Do not resuscitate: Secondary | ICD-10-CM | POA: Diagnosis present

## 2020-09-08 DIAGNOSIS — R569 Unspecified convulsions: Secondary | ICD-10-CM | POA: Diagnosis present

## 2020-09-08 DIAGNOSIS — G4700 Insomnia, unspecified: Secondary | ICD-10-CM | POA: Diagnosis present

## 2020-09-08 DIAGNOSIS — Z881 Allergy status to other antibiotic agents status: Secondary | ICD-10-CM

## 2020-09-08 DIAGNOSIS — T50901A Poisoning by unspecified drugs, medicaments and biological substances, accidental (unintentional), initial encounter: Secondary | ICD-10-CM | POA: Diagnosis present

## 2020-09-08 DIAGNOSIS — F411 Generalized anxiety disorder: Secondary | ICD-10-CM | POA: Diagnosis present

## 2020-09-08 DIAGNOSIS — Z7982 Long term (current) use of aspirin: Secondary | ICD-10-CM | POA: Diagnosis not present

## 2020-09-08 DIAGNOSIS — T1491XA Suicide attempt, initial encounter: Secondary | ICD-10-CM | POA: Diagnosis not present

## 2020-09-08 DIAGNOSIS — Z79899 Other long term (current) drug therapy: Secondary | ICD-10-CM | POA: Diagnosis not present

## 2020-09-08 DIAGNOSIS — R001 Bradycardia, unspecified: Secondary | ICD-10-CM | POA: Diagnosis present

## 2020-09-08 DIAGNOSIS — T426X2A Poisoning by other antiepileptic and sedative-hypnotic drugs, intentional self-harm, initial encounter: Secondary | ICD-10-CM | POA: Diagnosis present

## 2020-09-08 DIAGNOSIS — Z885 Allergy status to narcotic agent status: Secondary | ICD-10-CM | POA: Diagnosis not present

## 2020-09-08 DIAGNOSIS — Z96643 Presence of artificial hip joint, bilateral: Secondary | ICD-10-CM | POA: Diagnosis present

## 2020-09-08 DIAGNOSIS — F902 Attention-deficit hyperactivity disorder, combined type: Secondary | ICD-10-CM | POA: Diagnosis not present

## 2020-09-08 DIAGNOSIS — F332 Major depressive disorder, recurrent severe without psychotic features: Secondary | ICD-10-CM | POA: Diagnosis present

## 2020-09-08 DIAGNOSIS — Z630 Problems in relationship with spouse or partner: Secondary | ICD-10-CM | POA: Diagnosis not present

## 2020-09-08 DIAGNOSIS — F419 Anxiety disorder, unspecified: Secondary | ICD-10-CM | POA: Diagnosis not present

## 2020-09-08 DIAGNOSIS — Z20822 Contact with and (suspected) exposure to covid-19: Secondary | ICD-10-CM | POA: Diagnosis present

## 2020-09-08 DIAGNOSIS — T50902A Poisoning by unspecified drugs, medicaments and biological substances, intentional self-harm, initial encounter: Principal | ICD-10-CM

## 2020-09-08 DIAGNOSIS — Z818 Family history of other mental and behavioral disorders: Secondary | ICD-10-CM

## 2020-09-08 DIAGNOSIS — Z888 Allergy status to other drugs, medicaments and biological substances status: Secondary | ICD-10-CM

## 2020-09-08 DIAGNOSIS — F329 Major depressive disorder, single episode, unspecified: Secondary | ICD-10-CM | POA: Diagnosis present

## 2020-09-08 LAB — CBC WITH DIFFERENTIAL/PLATELET
Abs Immature Granulocytes: 0.01 10*3/uL (ref 0.00–0.07)
Basophils Absolute: 0 10*3/uL (ref 0.0–0.1)
Basophils Relative: 1 %
Eosinophils Absolute: 0.1 10*3/uL (ref 0.0–0.5)
Eosinophils Relative: 3 %
HCT: 31.1 % — ABNORMAL LOW (ref 36.0–46.0)
Hemoglobin: 9 g/dL — ABNORMAL LOW (ref 12.0–15.0)
Immature Granulocytes: 0 %
Lymphocytes Relative: 40 %
Lymphs Abs: 1 10*3/uL (ref 0.7–4.0)
MCH: 25.9 pg — ABNORMAL LOW (ref 26.0–34.0)
MCHC: 28.9 g/dL — ABNORMAL LOW (ref 30.0–36.0)
MCV: 89.6 fL (ref 80.0–100.0)
Monocytes Absolute: 0.3 10*3/uL (ref 0.1–1.0)
Monocytes Relative: 13 %
Neutro Abs: 1.1 10*3/uL — ABNORMAL LOW (ref 1.7–7.7)
Neutrophils Relative %: 43 %
Platelets: 443 10*3/uL — ABNORMAL HIGH (ref 150–400)
RBC: 3.47 MIL/uL — ABNORMAL LOW (ref 3.87–5.11)
RDW: 17.2 % — ABNORMAL HIGH (ref 11.5–15.5)
WBC: 2.5 10*3/uL — ABNORMAL LOW (ref 4.0–10.5)
nRBC: 0 % (ref 0.0–0.2)

## 2020-09-08 LAB — RESP PANEL BY RT-PCR (FLU A&B, COVID) ARPGX2
Influenza A by PCR: NEGATIVE
Influenza B by PCR: NEGATIVE
SARS Coronavirus 2 by RT PCR: NEGATIVE

## 2020-09-08 LAB — I-STAT CHEM 8, ED
BUN: 17 mg/dL (ref 8–23)
Calcium, Ion: 1.14 mmol/L — ABNORMAL LOW (ref 1.15–1.40)
Chloride: 115 mmol/L — ABNORMAL HIGH (ref 98–111)
Creatinine, Ser: 0.5 mg/dL (ref 0.44–1.00)
Glucose, Bld: 112 mg/dL — ABNORMAL HIGH (ref 70–99)
HCT: 30 % — ABNORMAL LOW (ref 36.0–46.0)
Hemoglobin: 10.2 g/dL — ABNORMAL LOW (ref 12.0–15.0)
Potassium: 3.3 mmol/L — ABNORMAL LOW (ref 3.5–5.1)
Sodium: 144 mmol/L (ref 135–145)
TCO2: 20 mmol/L — ABNORMAL LOW (ref 22–32)

## 2020-09-08 LAB — COMPREHENSIVE METABOLIC PANEL
ALT: 11 U/L (ref 0–44)
ALT: 11 U/L (ref 0–44)
AST: 16 U/L (ref 15–41)
AST: 17 U/L (ref 15–41)
Albumin: 2.8 g/dL — ABNORMAL LOW (ref 3.5–5.0)
Albumin: 2.8 g/dL — ABNORMAL LOW (ref 3.5–5.0)
Alkaline Phosphatase: 99 U/L (ref 38–126)
Alkaline Phosphatase: 96 U/L (ref 38–126)
Anion gap: 7 (ref 5–15)
Anion gap: 7 (ref 5–15)
BUN: 15 mg/dL (ref 8–23)
BUN: 13 mg/dL (ref 8–23)
CO2: 19 mmol/L — ABNORMAL LOW (ref 22–32)
CO2: 21 mmol/L — ABNORMAL LOW (ref 22–32)
Calcium: 8.2 mg/dL — ABNORMAL LOW (ref 8.9–10.3)
Calcium: 8.4 mg/dL — ABNORMAL LOW (ref 8.9–10.3)
Chloride: 115 mmol/L — ABNORMAL HIGH (ref 98–111)
Chloride: 115 mmol/L — ABNORMAL HIGH (ref 98–111)
Creatinine, Ser: 0.53 mg/dL (ref 0.44–1.00)
Creatinine, Ser: 0.57 mg/dL (ref 0.44–1.00)
GFR, Estimated: >60 mL/min (ref >60)
GFR, Estimated: >60 mL/min (ref >60)
Glucose, Bld: 116 mg/dL — ABNORMAL HIGH (ref 70–99)
Glucose, Bld: 118 mg/dL — ABNORMAL HIGH (ref 70–99)
Potassium: 3.3 mmol/L — ABNORMAL LOW (ref 3.5–5.1)
Potassium: 3.5 mmol/L (ref 3.5–5.1)
Sodium: 141 mmol/L (ref 135–145)
Sodium: 143 mmol/L (ref 135–145)
Total Bilirubin: 0.4 mg/dL (ref 0.3–1.2)
Total Bilirubin: 0.6 mg/dL (ref 0.3–1.2)
Total Protein: 5.9 g/dL — ABNORMAL LOW (ref 6.5–8.1)
Total Protein: 6 g/dL — ABNORMAL LOW (ref 6.5–8.1)

## 2020-09-08 LAB — URINALYSIS, ROUTINE W REFLEX MICROSCOPIC
Bilirubin Urine: NEGATIVE
Glucose, UA: NEGATIVE mg/dL
Hgb urine dipstick: NEGATIVE
Ketones, ur: NEGATIVE mg/dL
Leukocytes,Ua: NEGATIVE
Nitrite: NEGATIVE
Protein, ur: NEGATIVE mg/dL
Specific Gravity, Urine: 1.012 (ref 1.005–1.030)
pH: 6 (ref 5.0–8.0)

## 2020-09-08 LAB — ACETAMINOPHEN LEVEL
Acetaminophen (Tylenol), Serum: 35 ug/mL — ABNORMAL HIGH (ref 10–30)
Acetaminophen (Tylenol), Serum: 18 ug/mL (ref 10–30)

## 2020-09-08 LAB — I-STAT ARTERIAL BLOOD GAS, ED
Acid-base deficit: 4 mmol/L — ABNORMAL HIGH (ref 0.0–2.0)
Bicarbonate: 19.9 mmol/L — ABNORMAL LOW (ref 20.0–28.0)
Calcium, Ion: 1.08 mmol/L — ABNORMAL LOW (ref 1.15–1.40)
HCT: 29 % — ABNORMAL LOW (ref 36.0–46.0)
Hemoglobin: 9.9 g/dL — ABNORMAL LOW (ref 12.0–15.0)
O2 Saturation: 92 %
Potassium: 3.3 mmol/L — ABNORMAL LOW (ref 3.5–5.1)
Sodium: 146 mmol/L — ABNORMAL HIGH (ref 135–145)
TCO2: 21 mmol/L — ABNORMAL LOW (ref 22–32)
pCO2 arterial: 30.2 mmHg — ABNORMAL LOW (ref 32.0–48.0)
pH, Arterial: 7.427 (ref 7.350–7.450)
pO2, Arterial: 61 mmHg — ABNORMAL LOW (ref 83.0–108.0)

## 2020-09-08 LAB — SALICYLATE LEVEL: Salicylate Lvl: 8.7 mg/dL (ref 7.0–30.0)

## 2020-09-08 LAB — RAPID URINE DRUG SCREEN, HOSP PERFORMED
Amphetamines: POSITIVE — AB
Barbiturates: NOT DETECTED
Benzodiazepines: POSITIVE — AB
Cocaine: NOT DETECTED
Opiates: NOT DETECTED
Tetrahydrocannabinol: NOT DETECTED

## 2020-09-08 LAB — OSMOLALITY: Osmolality: 307 mOsm/kg — ABNORMAL HIGH (ref 275–295)

## 2020-09-08 LAB — LACTIC ACID, PLASMA: Lactic Acid, Venous: 1.1 mmol/L (ref 0.5–1.9)

## 2020-09-08 LAB — ETHANOL: Alcohol, Ethyl (B): <10 mg/dL (ref <10)

## 2020-09-08 MED ORDER — ATROPINE SULFATE 1 MG/10ML IJ SOSY
PREFILLED_SYRINGE | INTRAMUSCULAR | Status: AC
  Filled 2020-09-08: qty 10

## 2020-09-08 MED ORDER — KCL-LACTATED RINGERS 20 MEQ/L IV SOLN
INTRAVENOUS | Status: DC
  Filled 2020-09-08: qty 1000

## 2020-09-08 MED ORDER — SODIUM CHLORIDE 0.9 % IV BOLUS
1000.0000 mL | Freq: Once | INTRAVENOUS | Status: AC
  Administered 2020-09-08: 1000 mL via INTRAVENOUS

## 2020-09-08 MED ORDER — POTASSIUM CHLORIDE 10 MEQ/100ML IV SOLN
10.0000 meq | INTRAVENOUS | Status: AC
  Administered 2020-09-08 (×2): 10 meq via INTRAVENOUS
  Filled 2020-09-08 (×2): qty 100

## 2020-09-08 MED ORDER — ATROPINE SULFATE 1 MG/10ML IJ SOSY
0.5000 mg | PREFILLED_SYRINGE | INTRAMUSCULAR | Status: AC | PRN
  Administered 2020-09-09: 0.5 mg via INTRAVENOUS
  Filled 2020-09-08: qty 10

## 2020-09-08 MED ORDER — ATROPINE SULFATE 1 MG/ML IJ SOLN: 0.4000 mg | INTRAMUSCULAR | Status: DC | PRN

## 2020-09-08 MED ORDER — ATROPINE SULFATE 1 MG/10ML IJ SOSY
0.5000 mg | PREFILLED_SYRINGE | Freq: Once | INTRAMUSCULAR | Status: AC
  Administered 2020-09-08: 0.5 mg via INTRAVENOUS

## 2020-09-08 MED ORDER — ENOXAPARIN SODIUM 40 MG/0.4ML ~~LOC~~ SOLN
40.0000 mg | Freq: Every day | SUBCUTANEOUS | Status: DC
  Administered 2020-09-08 – 2020-09-13 (×6): 40 mg via SUBCUTANEOUS
  Filled 2020-09-08 (×6): qty 0.4

## 2020-09-08 NOTE — ED Notes (Signed)
Patient transported to CT scan . 

## 2020-09-08 NOTE — ED Notes (Signed)
Kelli Navarro (SGO) [phone], please call with update and next steps for pt

## 2020-09-08 NOTE — ED Notes (Addendum)
Per poison control If intubated-activated charcoal 1g per kg without sorbitol.  4 hour rule out tylenol level at 2030   Continuous cardiac, ekg q4-6 hours, any hypotension give fluid resuscitation and then dopamine if not responsive to fluid. Any bradycardia give fluids, if needed atropine. Watch for qrs prolongation of >120- treat with sodium bicarb 1-42meq/kg, seizure precautions.

## 2020-09-08 NOTE — ED Triage Notes (Signed)
Pt bib ems from home where wife found pt in bed unresponsive. Pt with empty pill bottles of gabapentin 100mg , clonidine 0.1mg , doxepin 10mg , clonazepam 2mg . Pt breathing shallow and slow, arrives on NRB. Pt HR dropped to 44 en route. 18g RAC.

## 2020-09-08 NOTE — ED Notes (Signed)
0.5mg  atropine given per Dr. Darl Householder order. Pt HR maintaining in the mid 40's. Pt remains obtunded at this time.

## 2020-09-08 NOTE — H&P (Addendum)
Date: 09/08/2020               Patient Name:  Kelli Navarro MRN: [id number]  DOB: [birth year] Age / Sex: 70 y.o., female   PCP: Rubie Maid, MD         Medical Service: Internal Medicine Teaching Service         Attending Physician: Dr. Velna Ochs, MD    First Contact: Dr. Virl Navarro  Pager: [phone]  Second Contact: Dr. Tamsen Snider  Pager: [phone]        After Hours (After 5p/  First Contact Pager: [phone]  weekends / holidays): Second Contact Pager: [phone]   Chief Complaint: Medicines overdose  History of Present Illness:   [name] is a 70 year old with past medical history of ADHD, anxiety, depression, chronic back pain, seizure, who presented to the ED via EMS for altered mental status from medication overdose.  History was obtained from patient's spouse, Kelli Navarro.  They live together but the spouse lives upstairs.  Per Kelli Navarro, patient asked her to bring her a bottle of Coke and complaint about her bad migraine around 11 AM.  When she checked back to patient's room at 4 PM, she found patient altered on the bed.  Patient was able to tell Kelli Navarro that she had OD on her medication.  When [place] asked " do you want to live anymore?",  Patient answered "no".  When she checked the medication drawer, she found empty bottles of gabapentin, clonidine, doxepin and clonazepam.  Kelli Navarro did not find the bottle of Topamax.  Kelli Navarro states that patient did not take the Adderall last night because patient knew that it will keep her awake.  States that Tylenol bottle was still full.  She will check around the house for any additional empty medication bottles.  Kelli Navarro states that patient has history of chronic back pain.  She also just had a right hip surgery 3 weeks ago.  She was taking Norco for pain but ran out 1 week ago and currently taking Tylenol.  States that patient has been dealing with pain her whole life.  Kelli Navarro states that patient was doing better in the last few weeks  and managing her own medications.  Kelli Navarro states the patient did not express any suicidal thoughts in the last few days.  She is seeing a psychiatrist and look like she is doing well and compliant with medications.  Patient is obtunded during examination.  She is not withdrawal to pain or sternal rub.  No corneal reflex.  In the ED, CT head was unremarkable.  Patient was bradycardic in the 40s and received 1 dose of 0.5 mg of atropine.  Patient received another 0.5 mg of atropine during examination.  Poison control was consulted. They recommended: EKG q4h, monitor for QRS prolong (give sodium bicarb 1-2 mEq/kg), give atropine for bradycardia, fluid resuscitation for hypotension (dopamine if needed).  PCCM was consulted for GCS score of 3.and persistent bradycardia.  Meds:  Current Meds  Medication Sig  . acetaminophen (TYLENOL) 500 MG tablet Take 500 mg by mouth every 6 (six) hours as needed (pain).  Marland Kitchen amphetamine-dextroamphetamine (ADDERALL) 20 MG tablet Take 20 mg by mouth 3 (three) times daily.   . cloNIDine (CATAPRES) 0.1 MG tablet TAKE 1 TABLET BY MOUTH THREE TIMES A DAY (Patient taking differently: Take 0.1 mg by mouth 3 (three) times daily.)  . doxepin (SINEQUAN) 10 MG capsule TAKE 2 -3 CAPS BY MOUTH AT BEDTIME (Patient taking  differently: Take 20-30 mg by mouth at bedtime.)  . gabapentin (NEURONTIN) 100 MG capsule Take 400 mg by mouth 3 (three) times daily.     Allergies: Allergies as of 09/08/2020 - Review Complete 09/08/2020  Allergen Reaction Noted  . Other Anaphylaxis 03/21/2017  . Aptiom [eslicarbazepine] Other (See Comments) 12/29/2014  . Azithromycin Other (See Comments) 06/08/2018  . Bupropion Other (See Comments) 12/29/2014  . Carbamazepine Other (See Comments) 06/08/2018  . Dilantin [phenytoin sodium extended] Other (See Comments) 12/29/2014  . Diphenhydramine Other (See Comments) 12/29/2014  . Divalproex sodium Other (See Comments) 12/29/2014  . Keppra [levetiracetam]  Other (See Comments) 12/29/2014  . Lamictal [lamotrigine] Other (See Comments) 12/29/2014  . Lorazepam Swelling and Other (See Comments) 01/02/2017  . Lyrica [pregabalin] Other (See Comments) 12/29/2014  . Morphine and related Other (See Comments) 12/29/2014  . Prednisone Other (See Comments) 12/29/2014  . Vimpat [lacosamide] Other (See Comments) 06/08/2018   Past Medical History:  Diagnosis Date  . ADHD   . Anxiety   . Chronic back pain    from broken back  . Depression   . Right femoral fracture ([place])   . [facility] spotted fever    may show low Hgb  . Seizures ([place])    has during sleep  . Vitamin B 12 deficiency     Family History:  Family History  Problem Relation Age of Onset  . Multiple sclerosis Mother   . Heart Problems Mother        by-pass  . Alcohol abuse Mother   . Heart Problems Sister        pacemaker    Social History:  Drinks alcohol occasionally No smoking No recreational drug use  Review of Systems: Unable to obtain due to patient's altered mental status  Physical Exam: Blood pressure 116/77, pulse (!) 47, temperature (!) 96.9 F (36.1 C), temperature source Axillary, resp. rate 14, SpO2 100 %.  Physical Exam Constitutional:      General: She is not in acute distress.    Comments: Patient is obtunded.  No response to painful stimuli or sternal rub.  No corneal reflex.    HENT:     Head: Normocephalic.  Eyes:     General: No scleral icterus.       Right eye: No discharge.        Left eye: No discharge.     Conjunctiva/sclera: Conjunctivae normal.     Pupils: Pupils are equal, round, and reactive to light.  Cardiovascular:     Rate and Rhythm: Regular rhythm. Bradycardia present.  Pulmonary:     Effort: Pulmonary effort is normal. No respiratory distress.  Abdominal:     General: Bowel sounds are normal. There is no distension.  Musculoskeletal:     Right lower leg: No edema.     Comments: Right hip surgery scar appears clean and  noninfected.  Skin:    General: Skin is warm.  Neurological:     Comments: GCS score 3     EKG: personally reviewed my interpretation is sinus rhythm  CXR: personally reviewed my interpretation is left atelectasis   CT head: IMPRESSION: 1. No acute intracranial abnormality. 2. Stable chronic small vessel ischemia.  Assessment & Plan by Problem: Active Problems:   Overdose  Makalya Nave is a 70 year old with past medical history of ADHD, anxiety, depression, chronic back pain, seizure, who presented to the ED via EMS for altered mental status from medications overdose.  Acute encephalopathy Medications overdose List of  OD medications: +Gabapentin +Doxepin +Clonidine +Clonazepam +Possible Topamax Patient was obtunded on exam.  No response to painful stimuli or sternal rub.  No corneal reflex.  GCS of 3.  PCCM was consulted but thought that patient does not need to be in the ICU at this time.  Patient is able to protect her airway and satting well on 2 L. Her bradycardia is persistent but her BP stable. Per spouse, patient is DNR/DNI. Poison control was consulted and recommended EKG q4h, monitor for QRS prolong (give sodium bicarb 1-2 mEq/kg), give atropine for bradycardia, fluid resuscitation for hypotension (dopamine if needed).  Ethanol and salicylate WNL. Initial acetaminophen level 35, repeat 18. Osmolar 7, rules out alcohol toxication. UDS positive for Benzo and Amphetamine, consistent with her home meds. -Appreciate PCCM recommendations -Aspiration precaution  -EKG every 4 hours.  Monitor for QRS and QTc prolongation -Atropine as needed for bradycardia -LR 125 cc/h with KCl 20 mEq -Initial ABG unremarkable.  Will repeat VBG -Check lactic acid -Patient will need psych consult for suicidal action.   Depression/anxiety Suicidal intent Per chart review, patient last saw behavioral health in August.  She was started on Lexapro for mood disorder anxiety.  She also  takes clonidine for ADHD and doxepin for insomnia. -Psych consult when patient is awake.  Possible inpatient psych admission   History of seizure Patient last saw neurology in November.  Per notes, patient could possibly have tonic-clonic seizure with may be coexisting psychogenic nonepileptic events.  Patient had a normal 1 hour EEG and plan was to proceed with a 72-hour EEG, which patient canceled the appointment.  Patient is taking Topamax and clonazepam. -No EEG indicated -Follow-up with neurology outpatient   Right femoral neck fracture Patient had a right femoral neck fracture in February after a fall.  Patient underwent an elective total hip arthroplasty on 2/10/122.  Per chart review, patient did not follow-up with orthopedic 2 weeks after the surgery. -Follow-up with orthopedics    CODE STATUS: DNR Diet: N.p.o. IV fluid: LR and KCl DVT: Lovenox Dispo: To be determined  Dispo: Admit patient to Inpatient with expected length of stay greater than 2 midnights.  SignedGaylan Gerold, DO 09/08/2020, 9:43 PM  Pager: [phone] After 5pm on weekdays and 1pm on weekends: On Call pager: [phone]

## 2020-09-08 NOTE — ED Provider Notes (Signed)
[facility] EMERGENCY DEPARTMENT Provider Note   CSN: [id number] Arrival date & time: 09/08/20  1756     History Chief Complaint  Patient presents with  . Drug Overdose    Kelli Navarro is a 70 y.o. female history of chronic back pain, previous femoral neck fractures, on chronic pain meds, seizures, here presenting with possible overdose.  She lives at home with her wife.  Around 11 AM, she had headache so her wife dropped off some medicines for migraine.  She came back around 4:30 PM and found her altered in her bed.  Patient has no head injury.  Patient had multiple empty bottles around her.  The bottles are gabapentin 100 mg tablets, clonidine 0.1 mg tablets, doxepin 10 mg tablets and clonazepam 2 mg tablets. Patient was noted to be having shallow slow breathing and became bradycardic to the 40s.   The history is provided by the patient.       Past Medical History:  Diagnosis Date  . ADHD   . Anxiety   . Chronic back pain    from broken back  . Depression   . Right femoral fracture ([place])   . [facility] spotted fever    may show low Hgb  . Seizures ([place])    has during sleep  . Vitamin B 12 deficiency     Patient Active Problem List   Diagnosis Date Noted  . Fracture of femoral neck, right ([place]) 08/16/2020  . Displaced fracture of right femoral neck ([place]) 08/16/2020  . AMS (altered mental status) 04/07/2020  . ADHD   . Fall   . Mood disorder ([place]) 05/25/2018  . Insomnia 05/25/2018  . Seizure disorder ([place])   . Displaced fracture of left femoral neck ([place]) 03/22/2017  . Left displaced femoral neck fracture ([place]) 03/21/2017  . Fracture of 5th metatarsal 03/21/2017  . Hypokalemia 03/21/2017  . Femur fracture ([place]) 03/21/2017  . Foot drop 03/21/2017  . Hypertension   . Chronic back pain   . Adjustment disorder with disturbance of emotion 01/01/2017  . Seizure-like activity ([place]) 10/17/2012    Past Surgical History:  Procedure  Laterality Date  . BACK SURGERY    . none    . radial frequency ablasion  05/2019   back  . TOTAL HIP ARTHROPLASTY Left 03/22/2017   Procedure: TOTAL HIP ARTHROPLASTY ANTERIOR APPROACH;  Surgeon: Rod Can, MD;  Location: [facility];  Service: Orthopedics;  Laterality: Left;  . TOTAL HIP ARTHROPLASTY Right 08/16/2020   Procedure: TOTAL HIP ARTHROPLASTY ANTERIOR APPROACH;  Surgeon: Rod Can, MD;  Location: [facility];  Service: Orthopedics;  Laterality: Right;     OB History   No obstetric history on file.     Family History  Problem Relation Age of Onset  . Multiple sclerosis Mother   . Heart Problems Mother        by-pass  . Alcohol abuse Mother   . Heart Problems Sister        pacemaker    Social History   Tobacco Use  . Smoking status: Never Smoker  . Smokeless tobacco: Never Used  Vaping Use  . Vaping Use: Never used  Substance Use Topics  . Alcohol use: No  . Drug use: No    Home Medications Prior to Admission medications   Medication Sig Start Date End Date Taking? Authorizing Provider  amphetamine-dextroamphetamine (ADDERALL) 20 MG tablet Take 20 mg by mouth 3 (three) times daily.  06/05/17   [provider]  aspirin 81 MG chewable tablet Chew 1 tablet (81 mg total) by mouth 2 (two) times daily. 08/17/20 10/01/20  Swinteck, Aaron Edelman, MD  clonazePAM (KLONOPIN) 2 MG tablet Take 1/2 tablet 4 times a day Patient taking differently: Take 1 mg by mouth 4 (four) times daily. 05/07/20   Cameron Sprang, MD  cloNIDine (CATAPRES) 0.1 MG tablet TAKE 1 TABLET BY MOUTH THREE TIMES A DAY Patient taking differently: Take 0.1 mg by mouth 3 (three) times daily. 07/26/20   Thayer Headings, PMHNP  docusate sodium (COLACE) 100 MG capsule Take 1 capsule (100 mg total) by mouth 2 (two) times daily. 08/17/20   Swinteck, Aaron Edelman, MD  doxepin (SINEQUAN) 10 MG capsule TAKE 2 -3 CAPS BY MOUTH AT BEDTIME Patient taking differently: Take 20-30 mg by mouth at bedtime. 06/05/20   Thayer Headings, PMHNP  gabapentin (NEURONTIN) 100 MG capsule Take 400 mg by mouth 3 (three) times daily. 01/03/20   [provider]  HYDROcodone-acetaminophen (NORCO/VICODIN) 5-325 MG tablet Take 1 tablet by mouth every 4 (four) hours as needed for moderate pain (pain score 4-6). 08/17/20   Swinteck, Aaron Edelman, MD  ipratropium (ATROVENT) 0.06 % nasal spray Place 2 sprays into both nostrils at bedtime. 10/11/19   [provider]  Multiple Vitamin (MULTIVITAMIN WITH MINERALS) TABS tablet Take 1 tablet by mouth daily.    [provider]  ondansetron (ZOFRAN) 4 MG tablet Take 1 tablet (4 mg total) by mouth every 6 (six) hours as needed for nausea. 08/17/20   Swinteck, Aaron Edelman, MD  senna (SENOKOT) 8.6 MG TABS tablet Take 2 tablets (17.2 mg total) by mouth at bedtime. 08/17/20   Swinteck, Aaron Edelman, MD  topiramate (TOPAMAX) 100 MG tablet Take 2 tabs twice a day Patient taking differently: Take 200 mg by mouth 2 (two) times daily. 05/07/20   Cameron Sprang, MD    Allergies    Other, Aptiom [eslicarbazepine], Azithromycin, Bupropion, Carbamazepine, Dilantin [phenytoin sodium extended], Diphenhydramine, Divalproex sodium, Keppra [levetiracetam], Lamictal [lamotrigine], Lorazepam, Lyrica [pregabalin], Morphine and related, Prednisone, and Vimpat [lacosamide]  Review of Systems   Review of Systems  Unable to perform ROS: Mental status change  All other systems reviewed and are negative.   Physical Exam Updated Vital Signs BP (!) 150/91   Pulse (!) 56   Resp 16   SpO2 99%   Physical Exam Vitals and nursing note reviewed.  Constitutional:      Comments: Patient is difficult to arouse.  She will wake up and fall asleep and has shallow breathing  HENT:     Head: Normocephalic.     Comments: No obvious scalp hematoma    Nose: Nose normal.     Mouth/Throat:     Mouth: Mucous membranes are dry.  Eyes:     Extraocular Movements: Extraocular movements intact.     Pupils: Pupils are equal,  round, and reactive to light.  Cardiovascular:     Rate and Rhythm: Normal rate and regular rhythm.     Pulses: Normal pulses.  Pulmonary:     Comments: Diminished breath sounds bilaterally Abdominal:     General: Abdomen is flat.     Palpations: Abdomen is soft.  Musculoskeletal:        General: Normal range of motion.     Cervical back: Normal range of motion and neck supple.  Skin:    General: Skin is warm.     Capillary Refill: Capillary refill takes less than 2 seconds.  Neurological:  Comments: Difficult to arouse and very sleepy.  Patient is moving all extremities but not following commands  Psychiatric:     Comments: Unable     ED Results / Procedures / Treatments   Labs (all labs ordered are listed, but only abnormal results are displayed) Labs Reviewed  I-STAT CHEM 8, ED - Abnormal; Notable for the following components:      Result Value   Potassium 3.3 (*)    Chloride 115 (*)    Glucose, Bld 112 (*)    Calcium, Ion 1.14 (*)    TCO2 20 (*)    Hemoglobin 10.2 (*)    HCT 30.0 (*)    All other components within normal limits  RESP PANEL BY RT-PCR (FLU A&B, COVID) ARPGX2  CBC WITH DIFFERENTIAL/PLATELET  COMPREHENSIVE METABOLIC PANEL  ETHANOL  SALICYLATE LEVEL  ACETAMINOPHEN LEVEL  RAPID URINE DRUG SCREEN, HOSP PERFORMED  URINALYSIS, ROUTINE W REFLEX MICROSCOPIC  BLOOD GAS, VENOUS    EKG EKG Interpretation  Date/Time:  Saturday September 08 2020 18:00:23 EST Ventricular Rate:  60 PR Interval:    QRS Duration: 95 QT Interval:  453 QTC Calculation: 453 R Axis:   -43 Text Interpretation: Sinus rhythm Left axis deviation Borderline T abnormalities, lateral leads No significant change since last tracing Confirmed by Wandra Arthurs ([id number]) on 09/08/2020 6:05:56 PM   Radiology DG Chest Port 1 View  Result Date: 09/08/2020 CLINICAL DATA:  Altered mental status.  Overdose. EXAM: PORTABLE CHEST 1 VIEW COMPARISON:  04/07/2020 FINDINGS: Chronic increased streaky  opacities at the left lung base. There are ill-defined right perihilar opacities. Overall low lung volumes. Normal heart size and mediastinal contours for technique. No pneumothorax or large pleural effusion. No pulmonary edema. No acute osseous abnormalities are seen. IMPRESSION: 1. Ill-defined right perihilar opacities, atelectasis versus pneumonia, including aspiration in this setting. 2. Chronic but increased streaky left basilar opacities, likely atelectasis superimposed on scarring. Electronically Signed   By: Keith Rake M.D.   On: 09/08/2020 18:16    Procedures Procedures   CRITICAL CARE Performed by: Wandra Arthurs   Total critical care time: 30 minutes  Critical care time was exclusive of separately billable procedures and treating other patients.  Critical care was necessary to treat or prevent imminent or life-threatening deterioration.  Critical care was time spent personally by me on the following activities: development of treatment plan with patient and/or surrogate as well as nursing, discussions with consultants, evaluation of patient's response to treatment, examination of patient, obtaining history from patient or surrogate, ordering and performing treatments and interventions, ordering and review of laboratory studies, ordering and review of radiographic studies, pulse oximetry and re-evaluation of patient's condition.   Medications Ordered in ED Medications  sodium chloride 0.9 % bolus 1,000 mL (has no administration in time range)    ED Course  I have reviewed the triage vital signs and the nursing notes.  Pertinent labs & imaging results that were available during my care of the patient were reviewed by me and considered in my medical decision making (see chart for details).    MDM Rules/Calculators/A&P                         Kelli Navarro is a 70 y.o. female here with drug overdose.  She had multiple empty bottles of gabapentin, clonidine, doxepin,  clonazepam.  Patient is also on pain medicine at home.  Likely polypharmacy.  Poison control was contacted by  the nurse.  Recommendations are as follows- Continuous cardiac, ekg q4-6 hours, any hypotension give fluid resuscitation and then dopamine if not responsive to fluid. Any bradycardia give fluids, if needed atropine. Watch for qrs prolongation of >120- treat with sodium bicarb 1-56meq/kg, seizure precautions.   Will get cbc, cmp, tox. Will hydrate and monitor closely   7:04 PM VBG reassuring.  Initial Tylenol level is 35.  Patient is protecting her airway currently.  Her wife is at bedside.  Will admit for close monitoring.  Patient required 1 dose of atropine since she bradycardia down to the low 40s.  Internal medicine teaching service to admit   Final Clinical Impression(s) / ED Diagnoses Final diagnoses:  None    Rx / DC Orders ED Discharge Orders    None       Drenda Freeze, MD 09/08/20 2027

## 2020-09-08 NOTE — Consult Note (Signed)
NAME:  Kelli Navarro, MRN:  [id number], DOB:  1950/09/13, LOS: 0 ADMISSION DATE:  09/08/2020, CONSULTATION DATE: 09/08/2020 REFERRING MD: Dr.Guilloud , CHIEF COMPLAINT: Overdose  Brief History:  This is a 70 year old female with polysubstance overdose History of Present Illness:  70 year old white female that presented to the emergency room from home.  Patient had a polysubstance overdose.  She took an unknown amount of doxepin clonazepam carboplatinum and clonidine.  She was found minimally responsive by EMS.  The patient's wife stated to the emergency room staff that patient was complaining of migraine approximately 1100 hrs. today.  She checked back on her at 1600 hrs. and found her with altered mental status.  Patient told her wife that she had taken medication and did not want to live.  On arrival to the emergency room she was found to be bradycardic with heart rate in the 40s but maintained a stable blood pressure.  QTc was 453.  End-tidal CO2 is 24.  Patient is able to protect her airway but not consistently interacting with her surroundings.  Patient had a total hip replaced approximately 3 weeks ago.  Past Medical History:  ADHD Anxiety Chronic back pain Depression Right femoral neck fracture with ORIF Seizures   Consults:  Critical care medicine  Procedures:  None  Significant Diagnostic Tests:    Micro Data:  Influenza negative Covid negative  Antimicrobials:  None    Objective   Blood pressure 116/77, pulse (!) 47, temperature (!) 96.9 F (36.1 C), temperature source Axillary, resp. rate 14, SpO2 100 %.       No intake or output data in the 24 hours ending 09/08/20 2145 There were no vitals filed for this visit.  Examination: General: No acute distress, obtunded  HENT: Atraumatic/normocephalic mucous membranes are moist no JVD Lungs: Clear to auscultation bilaterally no wheezing rales or rhonchi noted Cardiovascular: Bradycardic 1/6 systolic  ejection murmur Abdomen: Soft, nondistended, no rebound/rigidity/guarding the limited by neurologic status.  Positive bowel sounds. Extremities: Right hip incision clean dry and intact.  No edema or cyanosis Neuro: Pupils are 4 mm equal and reactive.  Patient is obtunded only responds to deep stimuli.  Positive gag positive cough reflex GU: Pure wick in place.   Assessment & Plan:  Intentional polysubstance overdose. 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  She is able to maintain a good blood pressure with that heart rate.  Another concern of course is that she is taken tricyclic antidepressant as part of her overdose cocktail but her QTC is 435 this time.  We will continue to monitor. Patient is able to be admitted to the telemetry unit.  If her condition was to worsen would consider transferring to the intensive care unit. Patient was made a DNR by her wife.  Best practice (evaluated daily)  Diet: N.p.o. Pain/Anxiety/Delirium protocol (if indicated): None VAP protocol (if indicated): None DVT prophylaxis: Lovenox GI prophylaxis:  Glucose control: Monitor blood sugar Mobility: Bedrest Disposition: PCU/telemetry floor  Goals of Care:   Code Status: DNR  Labs   CBC: Recent Labs  Lab 09/08/20 1801 09/08/20 1808  WBC 2.5*  --   NEUTROABS 1.1*  --   HGB 9.0* 9.9*  10.2*  HCT 31.1* 29.0*  30.0*  MCV 89.6  --   PLT 443*  --     Basic Metabolic Panel:  Recent Labs  Lab 09/08/20 1801 09/08/20 1808  NA 141 146*  144  K 3.3* 3.3*  3.3*  CL 115* 115*  CO2 19*  --   GLUCOSE 116* 112*  BUN 15 17  CREATININE 0.53 0.50  CALCIUM 8.2*  --    GFR: CrCl cannot be calculated (Unknown ideal weight.). Recent Labs  Lab 09/08/20 1801  WBC 2.5*    Liver Function Tests: Recent Labs  Lab 09/08/20 1801  AST 16   ALT 11  ALKPHOS 99  BILITOT 0.4  PROT 5.9*  ALBUMIN 2.8*   No results for input(s): LIPASE, AMYLASE in the last 168 hours. No results for input(s): AMMONIA in the last 168 hours.  ABG    Component Value Date/Time   PHART 7.427 09/08/2020 1808   PCO2ART 30.2 (L) 09/08/2020 1808   PO2ART 61 (L) 09/08/2020 1808   HCO3 19.9 (L) 09/08/2020 1808   TCO2 20 (L) 09/08/2020 1808   TCO2 21 (L) 09/08/2020 1808   ACIDBASEDEF 4.0 (H) 09/08/2020 1808   O2SAT 92.0 09/08/2020 1808     Coagulation Profile: No results for input(s): INR, PROTIME in the last 168 hours.  Cardiac Enzymes: No results for input(s): CKTOTAL, CKMB, CKMBINDEX, TROPONINI in the last 168 hours.  HbA1C: Hgb A1c MFr Bld  Date/Time Value Ref Range Status  03/27/2017 07:55 AM 4.8 4.8 - 5.6 % Final    Comment:    (NOTE) Pre diabetes:          5.7%-6.4% Diabetes:              >6.4% Glycemic control for   <7.0% adults with diabetes     CBG: No results for input(s): GLUCAP in the last 168 hours.  Review of Systems:   Unable to obtain secondary to patient's neurologic status.  Past Medical History:  She,  has a past medical history of ADHD, Anxiety, Chronic back pain, Depression, Right femoral fracture ([place]), [facility] spotted fever, Seizures ([place]), and Vitamin B 12 deficiency.   Surgical History:   Past Surgical History:  Procedure Laterality Date  . BACK SURGERY    . none    . radial frequency ablasion  05/2019   back  . TOTAL HIP ARTHROPLASTY Left 03/22/2017   Procedure: TOTAL HIP ARTHROPLASTY ANTERIOR APPROACH;  Surgeon: Rod Can, MD;  Location: [facility];  Service: Orthopedics;  Laterality: Left;  . TOTAL HIP ARTHROPLASTY Right 08/16/2020   Procedure: TOTAL HIP ARTHROPLASTY ANTERIOR APPROACH;  Surgeon: Rod Can, MD;  Location: [facility];  Service: Orthopedics;  Laterality: Right;     Social History:   reports that she has never smoked. She has never used smokeless tobacco. She reports that  she does not drink alcohol and does not use drugs.   Family History:  Her family history includes Alcohol abuse in her mother; Heart Problems in her mother and sister; Multiple sclerosis in her mother.   Allergies Allergies  Allergen Reactions  . 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 Aptiom [Eslicarbazepine] Other (See Comments)    Unknown reaction  . Azithromycin Other (See Comments)    Unknown reaction   . Bupropion Other (See Comments)     Caused depression  . Carbamazepine Other (See Comments)    Unknown reaction   . Dilantin [Phenytoin Sodium Extended] Other (See Comments)    Unknown reaction   . Diphenhydramine Other (See Comments)    Feels like "spiders are crawling" on her  .  Divalproex Sodium Other (See Comments)    Unknown reaction   . Keppra [Levetiracetam] Other (See Comments)    Unknown reaction   . Lamictal [Lamotrigine] Other (See Comments)    Unknown reaction   . Lorazepam Swelling and Other (See Comments)    Hallucinations  . Lyrica [Pregabalin] Other (See Comments)    Pt reports feeling loopy and confused  . Morphine And Related Other (See Comments)    Unknown reaction - (high doses)  . Prednisone Other (See Comments)    Unknown reaction   . Vimpat [Lacosamide] Other (See Comments)    Unknown reaction      Home Medications  Prior to Admission medications   Medication Sig Start Date End Date Taking? Authorizing Provider  acetaminophen (TYLENOL) 500 MG tablet Take 500 mg by mouth every 6 (six) hours as needed (pain).   Yes [provider]  amphetamine-dextroamphetamine (ADDERALL) 20 MG tablet Take 20 mg by mouth 3 (three) times daily.  06/05/17  Yes [provider]  aspirin 81 MG chewable tablet Chew 1 tablet (81 mg total) by mouth 2 (two) times daily. 08/17/20 10/01/20 Yes Swinteck, Aaron Edelman, MD  clonazePAM (KLONOPIN) 2 MG tablet Take 1/2 tablet 4 times a day Patient taking differently: Take 1 mg by mouth  4 (four) times daily. 05/07/20  Yes Cameron Sprang, MD  cloNIDine (CATAPRES) 0.1 MG tablet TAKE 1 TABLET BY MOUTH THREE TIMES A DAY Patient taking differently: Take 0.1 mg by mouth 3 (three) times daily. 07/26/20  Yes Thayer Headings, PMHNP  docusate sodium (COLACE) 100 MG capsule Take 1 capsule (100 mg total) by mouth 2 (two) times daily. 08/17/20  Yes Swinteck, Aaron Edelman, MD  doxepin (SINEQUAN) 10 MG capsule TAKE 2 -3 CAPS BY MOUTH AT BEDTIME Patient taking differently: Take 20-30 mg by mouth at bedtime. 06/05/20  Yes Thayer Headings, PMHNP  gabapentin (NEURONTIN) 100 MG capsule Take 400 mg by mouth 3 (three) times daily. 01/03/20  Yes [provider]  ipratropium (ATROVENT) 0.06 % nasal spray Place 2 sprays into both nostrils at bedtime. 10/11/19  Yes [provider]  Multiple Vitamin (MULTIVITAMIN WITH MINERALS) TABS tablet Take 1 tablet by mouth daily.   Yes [provider]  ondansetron (ZOFRAN) 4 MG tablet Take 1 tablet (4 mg total) by mouth every 6 (six) hours as needed for nausea. 08/17/20  Yes Swinteck, Aaron Edelman, MD  senna (SENOKOT) 8.6 MG TABS tablet Take 2 tablets (17.2 mg total) by mouth at bedtime. 08/17/20  Yes Swinteck, Aaron Edelman, MD  topiramate (TOPAMAX) 100 MG tablet Take 2 tabs twice a day Patient taking differently: Take 200 mg by mouth 2 (two) times daily. 05/07/20  Yes Cameron Sprang, MD     Critical care time: 35 minutes

## 2020-09-09 DIAGNOSIS — T465X2A Poisoning by other antihypertensive drugs, intentional self-harm, initial encounter: Secondary | ICD-10-CM | POA: Diagnosis not present

## 2020-09-09 DIAGNOSIS — T426X2A Poisoning by other antiepileptic and sedative-hypnotic drugs, intentional self-harm, initial encounter: Secondary | ICD-10-CM

## 2020-09-09 DIAGNOSIS — T1491XA Suicide attempt, initial encounter: Secondary | ICD-10-CM

## 2020-09-09 DIAGNOSIS — R401 Stupor: Secondary | ICD-10-CM

## 2020-09-09 DIAGNOSIS — T424X2A Poisoning by benzodiazepines, intentional self-harm, initial encounter: Secondary | ICD-10-CM

## 2020-09-09 DIAGNOSIS — F32A Depression, unspecified: Secondary | ICD-10-CM

## 2020-09-09 DIAGNOSIS — T50902A Poisoning by unspecified drugs, medicaments and biological substances, intentional self-harm, initial encounter: Secondary | ICD-10-CM | POA: Diagnosis not present

## 2020-09-09 DIAGNOSIS — G928 Other toxic encephalopathy: Secondary | ICD-10-CM

## 2020-09-09 DIAGNOSIS — F419 Anxiety disorder, unspecified: Secondary | ICD-10-CM

## 2020-09-09 LAB — BASIC METABOLIC PANEL
Anion gap: 6 (ref 5–15)
BUN: 11 mg/dL (ref 8–23)
CO2: 22 mmol/L (ref 22–32)
Calcium: 8.7 mg/dL — ABNORMAL LOW (ref 8.9–10.3)
Chloride: 117 mmol/L — ABNORMAL HIGH (ref 98–111)
Creatinine, Ser: 0.49 mg/dL (ref 0.44–1.00)
GFR, Estimated: >60 mL/min (ref >60)
Glucose, Bld: 110 mg/dL — ABNORMAL HIGH (ref 70–99)
Potassium: 4.1 mmol/L (ref 3.5–5.1)
Sodium: 145 mmol/L (ref 135–145)

## 2020-09-09 LAB — CBC
HCT: 30.4 % — ABNORMAL LOW (ref 36.0–46.0)
Hemoglobin: 8.9 g/dL — ABNORMAL LOW (ref 12.0–15.0)
MCH: 26 pg (ref 26.0–34.0)
MCHC: 29.3 g/dL — ABNORMAL LOW (ref 30.0–36.0)
MCV: 88.9 fL (ref 80.0–100.0)
Platelets: 423 10*3/uL — ABNORMAL HIGH (ref 150–400)
RBC: 3.42 MIL/uL — ABNORMAL LOW (ref 3.87–5.11)
RDW: 17.4 % — ABNORMAL HIGH (ref 11.5–15.5)
WBC: 2.3 10*3/uL — ABNORMAL LOW (ref 4.0–10.5)
nRBC: 0 % (ref 0.0–0.2)

## 2020-09-09 LAB — MAGNESIUM: Magnesium: 2.1 mg/dL (ref 1.7–2.4)

## 2020-09-09 LAB — PHOSPHORUS: Phosphorus: 3.5 mg/dL (ref 2.5–4.6)

## 2020-09-09 LAB — GLUCOSE, CAPILLARY: Glucose-Capillary: 97 mg/dL (ref 70–99)

## 2020-09-09 LAB — HIV ANTIBODY (ROUTINE TESTING W REFLEX): HIV Screen 4th Generation wRfx: NONREACTIVE

## 2020-09-09 MED ORDER — POTASSIUM CHLORIDE 2 MEQ/ML IV SOLN
INTRAVENOUS | Status: DC
  Filled 2020-09-09 (×5): qty 1000

## 2020-09-09 MED ORDER — POTASSIUM CHLORIDE 2 MEQ/ML IV SOLN
INTRAVENOUS | Status: DC
  Filled 2020-09-09: qty 1000

## 2020-09-09 MED ORDER — ATROPINE SULFATE 1 MG/10ML IJ SOSY: 0.5000 mg | PREFILLED_SYRINGE | INTRAMUSCULAR | Status: DC | PRN

## 2020-09-09 MED ORDER — KCL-LACTATED RINGERS 20 MEQ/L IV SOLN: INTRAVENOUS | Status: DC

## 2020-09-09 NOTE — Progress Notes (Signed)
I spoke to Kelli Navarro, patient's spouse , this afternoon via telephone. I explained external pacing of the heart and she says she understands what I have explained. She request we do not externally pace patient if required. Kelli Navarro has spoken to the patient's sister in order to make a shared decision. Kelli Navarro has spoken in detail with Kelli Navarro before this event and her wishes are not to prolong her life with artificial measures. Kelli Navarro and Kelli Navarro's sister believe this would go against patient's wishes.

## 2020-09-09 NOTE — Progress Notes (Signed)
Pt noted awake for approximately 5 mins.  Following directions. Trying to take contacts out of her right eye and removing O2.   MD at bedside shortly after & mittens applied for safety.

## 2020-09-09 NOTE — Progress Notes (Signed)
Several episodes of the patient being alert, verbal, following commands while keeping eyes closed for approximately 2-3 mins each time.   Pt orientation is variable, at times stating she is in the hospital, other times stating her living room. No signs of aggression, agitation, or anxiety. Regularly responds to pain, occasional responses to touch. Spouse at beside for a couple of hours at lunch.  MD updated

## 2020-09-09 NOTE — Progress Notes (Signed)
Rectal temp. 91.67F. On call Internal Medicine resident, Dr. Alfonse Spruce, paged. Warming blanket ordered and applied. Pt continues to only respond to pain. VS stable. Atropine given at [facility] for HR of 37 bpm. HR now 49. BP 142/74 (95). RR 14. SpO2 100% on 2L [place]. Will continue to monitor. Adella Hare, RN

## 2020-09-09 NOTE — Progress Notes (Addendum)
   Subjective:  No acute overnight events.  She became slightly more arousable a few minutes prior to our encounter as per RN. She is still verbally unresponsive, although is able to slightly open her eyes, perform a very mild cough, and squeeze fingers all on command.   Spoke with spouse via telephone call and updated her on current management. Asked about patient's wishes in regards to temporary pacing if she were to become severely bradycardic again, however spouse was unsure. Said that she will speak to patient's sister and get back to me. Otherwise all questions and concerns were addressed.  Objective:  Vital signs in last 24 hours: Vitals:   09/09/20 1030 09/09/20 1100 09/09/20 1130 09/09/20 1200  BP: 131/70 134/75 131/76 (!) 126/93  Pulse: 64 66 67 68  Resp:      Temp:  98.9 F (37.2 C)  99.2 F (37.3 C)  TempSrc:  Axillary  Axillary  SpO2: 98% 97% 98% 97%  Weight:       Physical Exam: General: elderly female, lying in bed, slightly arousable, NAD. Eyes: PERRL, EOMI. CV: normal rate and regular rhythm, no m/r/g. Pulm: CTABL, no adventitious sounds noted. Neuro: GCS 10, unable to perform full neuro exam but patient is able to open eyes, squeeze fingers bilaterally, and mildly cough on command.   Assessment/Plan:  Active Problems:   Overdose  Kelli Navarro is a 70 year old female with history of ADHD, anxiety, depression, chronic back pain, seizure who presented to the ED via EMS for altered mental status from overdose of home medications (gabapentin, doxepin, clonidine, clonazepam).  Acute encephalopathy Intentional Overdose of medications Depression/anxiety Suicidal intent Patient's spouse found empty pill bottles of gabapentin, doxepin (insomnia), clonidine (ADHD), and clonazepam (anxiety). Initially came in with GCS of 3, but is improved on exam today with GCS of 10. She continues to be able to protect her airway and has had resolution of her bradycardia. Per  spouse, patient is DNR/DNI. Poison control was contacted, who recommended EKG q4h with close monitoring of QTc and QRS (if prolonged, give sodium bicarb 1-2 mEq/kg), atropine if bradycardic, and fluid resuscitation if hypotensive (dopamine if needed). Expect continued improvement over the next couple of days. She will need a psych consult for suicidal action once she becomes more conscious. -continue LR @125cc /hr with KCl 8mEq -suicide and aspiration precautions -cardiac monitoring -q4h neurochecks -EKG q4h, monitor QTc and QRS -Atropine prn for bradycardia -psych consult once patient is alert and oriented   Prior to Admission Living Arrangement: Home Anticipated Discharge Location: TBD Barriers to Discharge: continued medical management Dispo: Anticipated discharge in approximately 2-3 day(s).   Virl Axe, MD 09/09/2020, 12:03 PM Pager: [phone] After 5pm on weekdays and 1pm on weekends: On Call pager [phone]
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
## 2020-09-09 NOTE — Progress Notes (Signed)
Pt arrived to 4E07 from ED. CHG bath completed and new gown applied. Tele applied and CCMD notified. HR 45 bpm. BP 127/73 (91). RR 14. SpO2 100% on 2L [place]. Pt only responsive to pain at this time. Suicide sitter present. Will continue to monitor. Adella Hare, RN

## 2020-09-09 NOTE — Progress Notes (Signed)
Unable to complete Q4 neuro checks due to pts LOC. Pt is only responsive to painful stimulus at this time. GCS of 3. VS stable with HR maintaining at 44. Will continue to monitor.  Adella Hare, RN
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
## 2020-09-10 DIAGNOSIS — T465X2A Poisoning by other antihypertensive drugs, intentional self-harm, initial encounter: Secondary | ICD-10-CM | POA: Diagnosis not present

## 2020-09-10 DIAGNOSIS — R001 Bradycardia, unspecified: Secondary | ICD-10-CM

## 2020-09-10 DIAGNOSIS — T50902A Poisoning by unspecified drugs, medicaments and biological substances, intentional self-harm, initial encounter: Secondary | ICD-10-CM | POA: Diagnosis not present

## 2020-09-10 DIAGNOSIS — T1491XA Suicide attempt, initial encounter: Secondary | ICD-10-CM | POA: Diagnosis not present

## 2020-09-10 DIAGNOSIS — T426X2A Poisoning by other antiepileptic and sedative-hypnotic drugs, intentional self-harm, initial encounter: Secondary | ICD-10-CM | POA: Diagnosis not present

## 2020-09-10 LAB — CBC
HCT: 29.2 % — ABNORMAL LOW (ref 36.0–46.0)
Hemoglobin: 9 g/dL — ABNORMAL LOW (ref 12.0–15.0)
MCH: 26.8 pg (ref 26.0–34.0)
MCHC: 30.8 g/dL (ref 30.0–36.0)
MCV: 86.9 fL (ref 80.0–100.0)
Platelets: 378 10*3/uL (ref 150–400)
RBC: 3.36 MIL/uL — ABNORMAL LOW (ref 3.87–5.11)
RDW: 17.2 % — ABNORMAL HIGH (ref 11.5–15.5)
WBC: 2.6 10*3/uL — ABNORMAL LOW (ref 4.0–10.5)
nRBC: 0 % (ref 0.0–0.2)

## 2020-09-10 LAB — COMPREHENSIVE METABOLIC PANEL
ALT: 9 U/L (ref 0–44)
AST: 14 U/L — ABNORMAL LOW (ref 15–41)
Albumin: 2.6 g/dL — ABNORMAL LOW (ref 3.5–5.0)
Alkaline Phosphatase: 86 U/L (ref 38–126)
Anion gap: 8 (ref 5–15)
BUN: 9 mg/dL (ref 8–23)
CO2: 21 mmol/L — ABNORMAL LOW (ref 22–32)
Calcium: 8.8 mg/dL — ABNORMAL LOW (ref 8.9–10.3)
Chloride: 111 mmol/L (ref 98–111)
Creatinine, Ser: 0.56 mg/dL (ref 0.44–1.00)
GFR, Estimated: >60 mL/min (ref >60)
Glucose, Bld: 75 mg/dL (ref 70–99)
Potassium: 4.3 mmol/L (ref 3.5–5.1)
Sodium: 140 mmol/L (ref 135–145)
Total Bilirubin: 0.5 mg/dL (ref 0.3–1.2)
Total Protein: 5.6 g/dL — ABNORMAL LOW (ref 6.5–8.1)

## 2020-09-10 LAB — GLUCOSE, CAPILLARY
Glucose-Capillary: 155 mg/dL — ABNORMAL HIGH (ref 70–99)
Glucose-Capillary: 68 mg/dL — ABNORMAL LOW (ref 70–99)

## 2020-09-10 MED ORDER — LACTATED RINGERS IV SOLN: INTRAVENOUS | Status: DC

## 2020-09-10 MED ORDER — DEXTROSE 50 % IV SOLN
12.5000 g | INTRAVENOUS | Status: AC
  Administered 2020-09-10: 12.5 g via INTRAVENOUS
  Filled 2020-09-10: qty 50

## 2020-09-10 MED ORDER — IBUPROFEN 400 MG PO TABS: 400.0000 mg | ORAL_TABLET | Freq: Four times a day (QID) | ORAL | Status: DC | PRN

## 2020-09-10 MED ORDER — IBUPROFEN 400 MG PO TABS
400.0000 mg | ORAL_TABLET | Freq: Four times a day (QID) | ORAL | Status: AC | PRN
  Administered 2020-09-11 (×2): 400 mg via ORAL
  Filled 2020-09-10 (×3): qty 1

## 2020-09-10 NOTE — Progress Notes (Signed)
   Subjective:  No acute overnight events.  Not alert enough to answer questions on exam this morning. Reevaluated in the afternoon and patient was alert and oriented x4. Reports doing fine, just having some hip pain from recent hip surgery about 8 days ago. Requesting food as she has not eaten in 2 days.  Objective:  Vital signs in last 24 hours: Vitals:   09/10/20 0000 09/10/20 0300 09/10/20 0351 09/10/20 0801  BP: 129/67 136/67  107/65  Pulse: (!) 46   (!) 49  Resp: 17 19  16   Temp: (!) 97.3 F (36.3 C)  (!) 97.5 F (36.4 C) 97.7 F (36.5 C)  TempSrc: Axillary  Axillary Axillary  SpO2: 100% 100%  98%  Weight:       Physical Exam: General: elderly female, lying in bed, slightly arousable, NAD. Eyes: PERRL, EOMI. CV: bradycardic rate and regular rhythm, no m/r/g. Pulm: CTABL, no adventitious sounds noted. Neuro  (in the afternoon): GCS 15, AAOx4, muscle strength 5/5 in bilateral UE and LLE. Unable to truly assess RLE strength due to limitation from recent right hip surgery.  Assessment/Plan:  Active Problems:   Overdose   Obtundation  Kelli Navarro is a 70 year old female with history of ADHD, anxiety, depression, chronic back pain, seizure who presented to the ED via EMS for altered mental status from overdose of home medications (gabapentin, doxepin, clonidine, clonazepam).  Acute encephalopathy Intentional Overdose of medications (gabapentin, doxepin, clonidine, clonazepam) Depression/anxiety with suicidal intent Bradycardia Exam this morning was unchanged from yesterday, aside from resurgence of bradycardia overnight. Upon reevaluation in the early afternoon, patient was fully alert and oriented with no focal deficits noted, answering questions appropriately, and following all commands. Expect continued improvement over the next couple of days. She will need a psych evaluation tomorrow, should she remain fully alert and oriented in the morning, due to suicide attempt  and intentional medication overdose.  -continue LR @125cc /hr  -suicide and aspiration precautions -cardiac monitoring -q4h neurochecks -EKG q12h, monitor QTc and QRS (sodium bicarb if QRS prolongation) -Atropine prn for bradycardia (for HR <40), no indication for temporary pacing at this time -f/u SLP evaluation   Prior to Admission Living Arrangement: Home Anticipated Discharge Location: TBD Barriers to Discharge: continued medical management Dispo: Anticipated discharge in approximately 2-3 day(s).   Virl Axe, MD 09/10/2020, 10:29 AM Pager: [phone] After 5pm on weekdays and 1pm on weekends: On Call pager [phone]

## 2020-09-10 NOTE — Evaluation (Signed)
Clinical/Bedside Swallow Evaluation Patient Details  Name: Kelli Navarro MRN: [id number] Date of Birth: Jun 12, 1951  Today's Date: 09/10/2020 Time: SLP Start Time (ACUTE ONLY): 1332 SLP Stop Time (ACUTE ONLY): 1345 SLP Time Calculation (min) (ACUTE ONLY): 13 min  Past Medical History:  Past Medical History:  Diagnosis Date  . ADHD   . Anxiety   . Chronic back pain    from broken back  . Depression   . Right femoral fracture ([place])   . [facility] spotted fever    may show low Hgb  . Seizures ([place])    has during sleep  . Vitamin B 12 deficiency    Past Surgical History:  Past Surgical History:  Procedure Laterality Date  . BACK SURGERY    . none    . radial frequency ablasion  05/2019   back  . 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 TOTAL HIP ARTHROPLASTY Right 08/16/2020   Procedure: TOTAL HIP ARTHROPLASTY ANTERIOR APPROACH;  Surgeon: Rod Can, MD;  Location: [facility];  Service: Orthopedics;  Laterality: Right;   HPI:  Kelli Navarro is a 70 year old with past medical history of ADHD, anxiety, depression, chronic back pain, seizure, who presented to the ED via EMS for altered mental status from medications overdose.   Assessment / Plan / Recommendation Clinical Impression  Pt presents with no s/s of dysphagia.  She complained of a sore throat.  Oral mechanism exam was normal - plates were not in place because she needs denture adhesive, so she declined to try regular solids.  However, oral control/manipulation of purees and thin liquids were normal. Swallow response was swift.  There were no concerns for aspiration. Recommend allowing regular solids/thin liquids. Will need oral adhesive in order to use dental plates. No SLP f/u is needed - our service will sign off. SLP Visit Diagnosis: Dysphagia, unspecified  (R13.10)    Aspiration Risk  No limitations    Diet Recommendation   regular solids, thin liquids  Medication Administration: Whole meds with liquid    Other  Recommendations Oral Care Recommendations: Oral care BID   Follow up Recommendations None        Swallow Study   General Date of Onset: 09/09/20 HPI: Kelli Navarro is a 70 year old with past medical history of ADHD, anxiety, depression, chronic back pain, seizure, who presented to the ED via EMS for altered mental status from medications overdose. Type of Study: Bedside Swallow Evaluation Diet Prior to this Study: NPO Temperature Spikes Noted: No Respiratory Status: Nasal cannula History of Recent Intubation: No Behavior/Cognition: Alert;Cooperative;Pleasant mood Oral Cavity Assessment: Within Functional Limits Oral Care Completed by SLP: No Oral Cavity - Dentition: Missing dentition Vision: Functional for self-feeding Self-Feeding Abilities: Able to feed self Patient Positioning: Upright in bed Baseline Vocal Quality: Normal Volitional Cough: Strong Volitional Swallow: Able to elicit    Oral/Motor/Sensory Function Overall Oral Motor/Sensory Function: Within functional limits   Ice Chips Ice chips: Within functional limits   Thin Liquid Thin Liquid: Within functional limits    Nectar Thick Nectar Thick Liquid: Not tested   Honey Thick Honey Thick Liquid: Not tested   Puree Puree: Within functional limits   Solid     Solid: Not tested      Kelli Navarro Kelli Navarro 09/10/2020,1:49 PM  Estill Bamberg L. Tivis Ringer, [place] Office number [phone] Pager [phone]

## 2020-09-10 NOTE — Progress Notes (Signed)
Pt is more alert and oriented x4. She has been cleared to eat by SLP and has a regular diet with thin liquids. Full set of dentures at bedside and RN brought patient denture adhesive.  RN also spoke with patients spouse regarding concerns about the patient coming home. RN reported concerned to case management. We are awaiting psychiatric consult.Fuller [place], RN

## 2020-09-11 DIAGNOSIS — F332 Major depressive disorder, recurrent severe without psychotic features: Secondary | ICD-10-CM | POA: Diagnosis not present

## 2020-09-11 DIAGNOSIS — T1491XA Suicide attempt, initial encounter: Secondary | ICD-10-CM | POA: Diagnosis not present

## 2020-09-11 DIAGNOSIS — T50902A Poisoning by unspecified drugs, medicaments and biological substances, intentional self-harm, initial encounter: Secondary | ICD-10-CM | POA: Diagnosis present

## 2020-09-11 DIAGNOSIS — F902 Attention-deficit hyperactivity disorder, combined type: Secondary | ICD-10-CM | POA: Diagnosis not present

## 2020-09-11 DIAGNOSIS — T465X2A Poisoning by other antihypertensive drugs, intentional self-harm, initial encounter: Secondary | ICD-10-CM | POA: Diagnosis not present

## 2020-09-11 DIAGNOSIS — T426X2A Poisoning by other antiepileptic and sedative-hypnotic drugs, intentional self-harm, initial encounter: Secondary | ICD-10-CM | POA: Diagnosis not present

## 2020-09-11 DIAGNOSIS — F411 Generalized anxiety disorder: Secondary | ICD-10-CM

## 2020-09-11 LAB — CBC
HCT: 28.4 % — ABNORMAL LOW (ref 36.0–46.0)
Hemoglobin: 8.8 g/dL — ABNORMAL LOW (ref 12.0–15.0)
MCH: 26.4 pg (ref 26.0–34.0)
MCHC: 31 g/dL (ref 30.0–36.0)
MCV: 85.3 fL (ref 80.0–100.0)
Platelets: 380 10*3/uL (ref 150–400)
RBC: 3.33 MIL/uL — ABNORMAL LOW (ref 3.87–5.11)
RDW: 16.5 % — ABNORMAL HIGH (ref 11.5–15.5)
WBC: 3.4 10*3/uL — ABNORMAL LOW (ref 4.0–10.5)
nRBC: 0 % (ref 0.0–0.2)

## 2020-09-11 LAB — COMPREHENSIVE METABOLIC PANEL
ALT: 10 U/L (ref 0–44)
AST: 14 U/L — ABNORMAL LOW (ref 15–41)
Albumin: 2.6 g/dL — ABNORMAL LOW (ref 3.5–5.0)
Alkaline Phosphatase: 86 U/L (ref 38–126)
Anion gap: 6 (ref 5–15)
BUN: 9 mg/dL (ref 8–23)
CO2: 28 mmol/L (ref 22–32)
Calcium: 8.8 mg/dL — ABNORMAL LOW (ref 8.9–10.3)
Chloride: 105 mmol/L (ref 98–111)
Creatinine, Ser: 0.66 mg/dL (ref 0.44–1.00)
GFR, Estimated: >60 mL/min (ref >60)
Glucose, Bld: 89 mg/dL (ref 70–99)
Potassium: 3.7 mmol/L (ref 3.5–5.1)
Sodium: 139 mmol/L (ref 135–145)
Total Bilirubin: 0.3 mg/dL (ref 0.3–1.2)
Total Protein: 5.6 g/dL — ABNORMAL LOW (ref 6.5–8.1)

## 2020-09-11 LAB — GLUCOSE, CAPILLARY: Glucose-Capillary: 78 mg/dL (ref 70–99)

## 2020-09-11 MED ORDER — MIRTAZAPINE 15 MG PO TABS
7.5000 mg | ORAL_TABLET | Freq: Every day | ORAL | Status: DC
  Administered 2020-09-11 – 2020-09-13 (×3): 7.5 mg via ORAL
  Filled 2020-09-11 (×3): qty 1

## 2020-09-11 NOTE — Evaluation (Signed)
Physical Therapy Evaluation Patient Details Name: Kelli Navarro MRN: [id number] DOB: 04/17/1951 Today's Date: 09/11/2020   History of Present Illness  Pt adm after overdose for suicide attempt. Pt with recent rt hip fx and underwent rt THR using anterior approach on 08/16/20. PMH - lt THR, ADD, back surgery, depression, anxiety.  Clinical Impression  Pt presents to PT with slightly unsteady gait due to recent hip surgery and decreased activity. Expect pt will make good progress back to baseline with mobility. Pt should be able to mobilize in inpatient psych setting and can follow up with orthopedist concerning any therapy after she returns home.      Follow Up Recommendations Follow surgeon's recommendation for DC plan and follow-up therapies (After pt returns home resume orthopedic surgeon recommendations for continued PT)    Equipment Recommendations  None recommended by PT    Recommendations for Other Services       Precautions / Restrictions Precautions Precautions: Other (comment) (suicide) Restrictions Weight Bearing Restrictions: No      Mobility  Bed Mobility Overal bed mobility: Modified Independent                  Transfers Overall transfer level: Needs assistance Equipment used: None Transfers: Sit to/from Stand Sit to Stand: Min guard         General transfer comment: Assist for safety and lines  Ambulation/Gait Ambulation/Gait assistance: Min guard;Supervision Gait Distance (Feet): 200 Feet Assistive device: Rolling walker (2 wheeled) Gait Pattern/deviations: Step-through pattern;Decreased stride length;Trunk flexed Gait velocity: decr Gait velocity interpretation: <1.31 ft/sec, indicative of household ambulator General Gait Details: Initially min guard for balance but improved to supervision with incr distance  Stairs            Wheelchair Mobility    Modified Rankin (Stroke Patients Only)       Balance Overall balance  assessment: Needs assistance Sitting-balance support: No upper extremity supported;Feet supported Sitting balance-Leahy Scale: Normal     Standing balance support: No upper extremity supported Standing balance-Leahy Scale: Fair Standing balance comment: Needed walker for dynamic                             Pertinent Vitals/Pain Pain Assessment: No/denies pain    Home Living Family/patient expects to be discharged to:: Other (Comment) Living Arrangements: Spouse/significant other Available Help at Discharge: Family;Available 24 hours/day Type of Home: House Home Access: Stairs to enter   [employer] of Steps: 7 Home Layout: Two level;Bed/bath upstairs Home Equipment: Bedside commode;Cane - single point;Shower seat;Walker - 2 wheels Additional Comments: Pt to be admitted to inpatient psych    Prior Function Level of Independence: Independent         Comments: Pt reports she was amb without assistive device since hip surgery several weeks ago.     Hand Dominance        Extremity/Trunk Assessment   Upper Extremity Assessment Upper Extremity Assessment: Defer to OT evaluation    Lower Extremity Assessment Lower Extremity Assessment: Generalized weakness;RLE deficits/detail RLE Deficits / Details: Decr mobility due to recent fx and THR       Communication   Communication: No difficulties  Cognition Arousal/Alertness: Awake/alert Behavior During Therapy: WFL for tasks assessed/performed Overall Cognitive Status: Within Functional Limits for tasks assessed  General Comments      Exercises     Assessment/Plan    PT Assessment Patient needs continued PT services  PT Problem List Decreased strength;Decreased balance;Decreased mobility       PT Treatment Interventions DME instruction;Gait training;Functional mobility training;Stair training;Therapeutic activities;Therapeutic  exercise;Balance training;Patient/family education    PT Goals (Current goals can be found in the Care Plan section)  Acute Rehab PT Goals Patient Stated Goal: amb without device PT Goal Formulation: With patient Time For Goal Achievement: 09/25/20 Potential to Achieve Goals: Good    Frequency Min 3X/week   Barriers to discharge        Co-evaluation               AM-PAC PT "6 Clicks" Mobility  Outcome Measure Help needed turning from your back to your side while in a flat bed without using bedrails?: None Help needed moving from lying on your back to sitting on the side of a flat bed without using bedrails?: None Help needed moving to and from a bed to a chair (including a wheelchair)?: A Little Help needed standing up from a chair using your arms (e.g., wheelchair or bedside chair)?: A Little Help needed to walk in hospital room?: A Little Help needed climbing 3-5 steps with a railing? : A Little 6 Click Score: 20    End of Session   Activity Tolerance: Patient tolerated treatment well Patient left: in bed;with call bell/phone within reach;with bed alarm set;with nursing/sitter in room   PT Visit Diagnosis: Unsteadiness on feet (R26.81);Other abnormalities of gait and mobility (R26.89)    Time: 1425-1501 PT Time Calculation (min) (ACUTE ONLY): 36 min   Charges:   PT Evaluation $PT Eval Moderate Complexity: 1 Mod PT Treatments $Gait Training: 8-22 mins        [place] Pager [phone] Office [place] 09/11/2020, 4:52 PM
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
## 2020-09-11 NOTE — Progress Notes (Signed)
   Subjective:  No acute overnight events.  Feeling better today. She was able to tolerate eating her oatmeal. Denies having any pain today. Feels like she can walk today.  Objective:  Vital signs in last 24 hours: Vitals:   09/11/20 0100 09/11/20 0200 09/11/20 0300 09/11/20 0400  BP: 125/80 121/68 120/69 129/67  Pulse: 64 66  65  Resp: 19 19 20 15   Temp: 98.4 F (36.9 C)   97.9 F (36.6 C)  TempSrc: Oral   Oral  SpO2: 98% 97% 97% 97%  Weight:       Physical Exam: General: elderly female, lying in bed, NAD. CV: normal rate and regular rhythm, no m/rg. Pulm: CTABL, no adventitious sounds noted. Neuro: AAOx4, back to baseline mental status, no focal deficits noted.  Assessment/Plan:  Principal Problem:   Suicide attempt by drug overdose [facility]) Active Problems:   Overdose   Obtundation  Kelli Navarro is a 70 year old female with history of ADHD, anxiety, depression, chronic back pain, seizure who presented to the ED via EMS for altered mental status from overdose of home medications (gabapentin, doxepin, clonidine, clonazepam).  Intentional Overdose of medications (gabapentin, doxepin, clonidine, clonazepam) Depression/anxiety with suicidal intent Acute encephalopathy - resolved Bradycardia - resolved She is doing well today. Bradycardia has resolved and her mentation is great. Psychiatry consulted, recommend psychiatric inpatient admission once medically stable. Will have patient work with PT/OT for mobility/OOB. Otherwise, she is medically stable for discharge. -SLP evaluated, on regular diet, tolerating well -stopped IV fluids, encourage PO intake -consulted TOC to help with inpatient psychiatry admission, appreciate assistance -psych planning to start mirtazipine to help with appetite and sleep   Prior to Admission Living Arrangement: Home Anticipated Discharge Location: inpatient psychiatry Barriers to Discharge: none Dispo: Anticipated discharge in  approximately 1-2 day(s).   Virl Axe, MD 09/11/2020, 12:57 PM Pager: [phone] After 5pm on weekdays and 1pm on weekends: On Call pager [phone]

## 2020-09-12 DIAGNOSIS — F332 Major depressive disorder, recurrent severe without psychotic features: Secondary | ICD-10-CM

## 2020-09-12 DIAGNOSIS — T1491XA Suicide attempt, initial encounter: Secondary | ICD-10-CM | POA: Diagnosis not present

## 2020-09-12 DIAGNOSIS — T50902A Poisoning by unspecified drugs, medicaments and biological substances, intentional self-harm, initial encounter: Secondary | ICD-10-CM | POA: Diagnosis not present

## 2020-09-12 DIAGNOSIS — T426X2A Poisoning by other antiepileptic and sedative-hypnotic drugs, intentional self-harm, initial encounter: Secondary | ICD-10-CM | POA: Diagnosis not present

## 2020-09-12 DIAGNOSIS — F411 Generalized anxiety disorder: Secondary | ICD-10-CM

## 2020-09-12 DIAGNOSIS — T465X2A Poisoning by other antihypertensive drugs, intentional self-harm, initial encounter: Secondary | ICD-10-CM | POA: Diagnosis not present

## 2020-09-12 LAB — GLUCOSE, CAPILLARY: Glucose-Capillary: 87 mg/dL (ref 70–99)

## 2020-09-12 MED ORDER — CLONAZEPAM 0.5 MG PO TABS
1.0000 mg | ORAL_TABLET | Freq: Four times a day (QID) | ORAL | Status: DC
  Administered 2020-09-12 – 2020-09-14 (×10): 1 mg via ORAL
  Filled 2020-09-12 (×10): qty 2

## 2020-09-12 MED ORDER — ASPIRIN 81 MG PO CHEW
81.0000 mg | CHEWABLE_TABLET | Freq: Two times a day (BID) | ORAL | Status: DC
  Administered 2020-09-12 – 2020-09-14 (×5): 81 mg via ORAL
  Filled 2020-09-12 (×5): qty 1

## 2020-09-12 MED ORDER — GABAPENTIN 400 MG PO CAPS
400.0000 mg | ORAL_CAPSULE | Freq: Three times a day (TID) | ORAL | Status: DC
  Administered 2020-09-12 (×3): 400 mg via ORAL
  Filled 2020-09-12 (×4): qty 1

## 2020-09-12 MED ORDER — TOPIRAMATE 100 MG PO TABS
200.0000 mg | ORAL_TABLET | Freq: Two times a day (BID) | ORAL | Status: DC
  Administered 2020-09-12 – 2020-09-14 (×5): 200 mg via ORAL
  Filled 2020-09-12: qty 2
  Filled 2020-09-12 (×3): qty 8
  Filled 2020-09-12 (×2): qty 2

## 2020-09-12 NOTE — Progress Notes (Signed)
   Subjective:   No acute overnight events.  Doing well today. Feels sad that her wife is leaving her. States that she is willing to go to inpatient psych voluntarily to get the help she needs.  Objective:  Vital signs in last 24 hours: Vitals:   09/11/20 2000 09/12/20 0000 09/12/20 0546 09/12/20 0803  BP: (!) 160/85 (!) 157/91 (!) 156/90 (!) 150/91  Pulse: (!) 105 94 100 (!) 104  Resp: 17 20 20 19   Temp: 99.1 F (37.3 C) 98.9 F (37.2 C) 98.9 F (37.2 C) 98.8 F (37.1 C)  TempSrc: Oral  Oral Oral  SpO2: 97% 94%  96%  Weight:   57.3 kg    Physical Exam: General: elderly female, lying in bed, NAD. CV: slightly tachycardic rate, regular rhythm, no m/r/g. Pulm: CTABL, no adventitious sounds noted. Neuro: AAOx4, no focal deficits.  Assessment/Plan:  Principal Problem:   Suicide attempt by drug overdose [facility]) Active Problems:   Overdose   Obtundation   Severe episode of recurrent major depressive disorder, without psychotic features ([place])   Generalized anxiety disorder  Rondi Ivy is a 70 year old female with history of ADHD, anxiety, depression, chronic back pain, seizure who presented to the ED via EMS for altered mental status from overdose of home medications (gabapentin, doxepin, clonidine, clonazepam), now awaiting placement to inpatient geri-psych.  Intentional Overdose of medications (gabapentin, doxepin, clonidine, clonazepam) Depression/anxiety with suicidal intent Acute encephalopathy - resolved Bradycardia - resolved Medically stable for discharge, pending admission to inpatient geri-psych unit as she is a high risk for completed suicide as per psychiatry. Restarted home gabapentin for back pain, topamax for seizure ppx, and clonazepam to prevent acute benzodiazepine withdrawal. Continuing mirtazipine as per psych. Seen by PT/OT, no recommended follow up needed. Tolerating regular diet well. Appreciate TOC assistance with admission to inpatient geri-psych  unit.  Prior to Admission Living Arrangement: Home Anticipated Discharge Location: inpatient psychiatry Barriers to Discharge: none Dispo: Anticipated discharge in approximately 1-2 day(s).   Virl Axe, MD 09/12/2020, 10:17 AM Pager: [phone] After 5pm on weekdays and 1pm on weekends: On Call pager [phone]

## 2020-09-12 NOTE — TOC Progression Note (Signed)
Transition of Care [facility]) - Progression Note    Patient Details  Name: Kelli Navarro MRN: [id number] Date of Birth: Feb 19, 1951  Transition of Care [facility]) CM/SW [place], [place] Phone Number: 09/12/2020, 5:27 PM  Clinical Narrative:     CSW spoke with patient's spouse,Judy(w/pateint's permission). CSW introduced self and explained role. CSW informed of recommendations for inpatient Gero-psych. she states she visit with the patient today. She inquired when the patient will  be discharged and where is she going for inpatient psych. CSW explained unable to dictate when psych will have availability (if not cleared before then) and how long she will be there if she does transfer to inpatient psych facility-CSW is waiting on availability at inpatient facility.  CSW processed toward discussing their relationship. Patient's spouse confirmed for the past  10 years, their marriage has been strained. She shared she loves the patient but she can no longer physically or mentally remain in the marriage. She states she is not in good health and for her own well being they can no longer stay together. She wants a divorce. CSW acknowledge her feeling and concerns. CSW explain it is a conversation she should continue to have with the patient when/if they deemed appropriate. She will need to give patient proper notice (eviction,etc) if she wants the patient to find another place to stay. She states understanding. She requested CSW keep her updated on placement.   CSW will continue to follow and assist with discharge planning.  CSW will continue to seek psych placement  Thurmond Butts, MSW, LCSW Clinical Social Worker    Expected Discharge Plan: Psychiatric Hospital Barriers to Discharge: Continued Medical Work up (psych bed offer, homelessness, assistance w/ADL,)  Expected Discharge Plan and Services Expected Discharge Plan: [facility] In-house Referral: Clinical Social Work      Living arrangements for the past 2 months: Single Family Home                                       Social Determinants of Health (SDOH) Interventions    Readmission Risk Interventions No flowsheet data found.

## 2020-09-12 NOTE — Care Management Important Message (Signed)
Important Message  Patient Details  Name: Kelli Navarro MRN: [id number] Date of Birth: 04-Apr-1951   Medicare Important Message Given:  Yes     Iden Stripling P Fina Heizer 09/12/2020, 2:16 PM

## 2020-09-12 NOTE — TOC Progression Note (Signed)
Transition of Care [facility]) - Progression Note    Patient Details  Name: Kelli Navarro MRN: [id number] Date of Birth: [birth year]  Transition of Care [facility]) CM/SW [place], [place] Phone Number: 09/12/2020, 3:21 PM  Clinical Narrative:     Patient has no bed offers.  Thurmond Butts, MSW, LCSW Clinical Social Worker   Expected Discharge Plan: Psychiatric Hospital Barriers to Discharge: Continued Medical Work up (psych bed offer, homelessness, assistance w/ADL,)  Expected Discharge Plan and Services Expected Discharge Plan: [facility] In-house Referral: Clinical Social Work     Living arrangements for the past 2 months: Single Family Home                                       Social Determinants of Health (SDOH) Interventions    Readmission Risk Interventions No flowsheet data found.

## 2020-09-12 NOTE — Evaluation (Signed)
Occupational Therapy Evaluation Patient Details Name: Kelli Navarro MRN: [id number] DOB: 04/29/1951 Today's Date: 09/12/2020    History of Present Illness Pt adm after overdose for suicide attempt. Pt with recent rt hip fx and underwent rt THR using anterior approach on 08/16/20. PMH - lt THR, ADD, back surgery, depression, anxiety.   Clinical Impression   Patient admitted for the diagnosis above.  Currently she is 1 on 1 for suicide precautions.  Currently the only deficit is mild unsteadiness noted when up and walking without an AD.  She is presenting at her baseline for ADL skills in the acute setting.  The eminent plan is for a short term stay at an inpatient psyche rehab facility prior to home.  No further OT needs in the acute setting.       Follow Up Recommendations  No OT follow up    Equipment Recommendations  None recommended by OT    Recommendations for Other Services       Precautions / Restrictions Precautions Precautions: Other (comment) Precaution Comments: suicide precautions 1 on 1 sitter. Restrictions Weight Bearing Restrictions: No      Mobility Bed Mobility Overal bed mobility: Modified Independent               Patient Response: Cooperative  Transfers Overall transfer level: Modified independent               General transfer comment: reaching for objects in her environment from time to time    Balance Overall balance assessment: Mild deficits observed, not formally tested             Standing balance comment: Mild unsteadiness noted with mobility in the halls.  RW is a good option                           ADL either performed or assessed with clinical judgement   ADL Overall ADL's : At baseline                                       General ADL Comments: mild unsteadiness noted     Vision Patient Visual Report: No change from baseline       Perception     Praxis      Pertinent  Vitals/Pain Pain Assessment: No/denies pain     Hand Dominance Right   Extremity/Trunk Assessment Upper Extremity Assessment Upper Extremity Assessment: Overall WFL for tasks assessed   Lower Extremity Assessment Lower Extremity Assessment: Defer to PT evaluation   Cervical / Trunk Assessment Cervical / Trunk Assessment: Normal   Communication Communication Communication: No difficulties   Cognition Arousal/Alertness: Awake/alert Behavior During Therapy: WFL for tasks assessed/performed Overall Cognitive Status: Within Functional Limits for tasks assessed                                     General Comments   HR to 144 with mobility.      Exercises     Shoulder Instructions      Home Living Family/patient expects to be discharged to:: Other (Comment) Living Arrangements: Spouse/significant other Available Help at Discharge: Family;Available 24 hours/day Type of Home: House Home Access: Stairs to enter [employer] of Steps: 7   Home Layout: Two level;Bed/bath upstairs  Bathroom Shower/Tub: Occupational psychologist: Standard     Home Equipment: Bedside commode;Cane - single point;Shower seat;Walker - 2 wheels   Additional Comments: Pt to be admitted to inpatient psych      Prior Functioning/Environment Level of Independence: Independent        Comments: patient not needing any assist with ADL, or IADL        OT Problem List: Impaired balance (sitting and/or standing)      OT Treatment/Interventions:      OT Goals(Current goals can be found in the care plan section) Acute Rehab OT Goals Patient Stated Goal: To go home and figure out my future OT Goal Formulation: With patient Time For Goal Achievement: 09/12/20 Potential to Achieve Goals: Good  OT Frequency:     Barriers to D/C:  suicide precautions.            Co-evaluation              AM-PAC OT "6 Clicks" Daily Activity     Outcome Measure  Help from another person eating meals?: None Help from another person taking care of personal grooming?: None Help from another person toileting, which includes using toliet, bedpan, or urinal?: None Help from another person bathing (including washing, rinsing, drying)?: None Help from another person to put on and taking off regular upper body clothing?: None Help from another person to put on and taking off regular lower body clothing?: None 6 Click Score: 24   End of Session Equipment Utilized During Treatment: Gait belt Nurse Communication: Mobility status  Activity Tolerance: Patient tolerated treatment well Patient left: in bed;with call bell/phone within reach;with nursing/sitter in room  OT Visit Diagnosis: Unsteadiness on feet (R26.81)                Time: 8756-4332 OT Time Calculation (min): 22 min Charges:  OT General Charges $OT Visit: 1 Visit OT Evaluation $OT Eval Moderate Complexity: 1 Mod  09/12/2020  Rich, OTR/L  Acute Rehabilitation Services  Office:  [phone]   Metta Clines 09/12/2020, 2:43 PM

## 2020-09-12 NOTE — Progress Notes (Signed)
Psychiatry Progress Note  09/12/2020 10:34 AM Kelli Navarro  MRN:  [id number]   Subjective: No acute overnight events.  Patient evaluated at bedside this morning, no family present in the room. She denies any suicidal or homicidal ideations.  She states her mood is good for now but it might change after she talks to her partner.  She complains of poor sleep and asked her medical team to start Klonopin which usually helps her sleep better and helps with her anxiety as well along with seizures.  Principal Problem: Suicide attempt by drug overdose (Kelli Navarro) Diagnosis: Principal Problem:   Suicide attempt by drug overdose ([place]) Active Problems:   Overdose   Obtundation   Severe episode of recurrent major depressive disorder, without psychotic features ([place])   Generalized anxiety disorder  Total Time spent with patient: 25 minutes  Past Psychiatric History: Depressive disorder, generalized anxiety disorder, ADHD, previous suicide attempt with overdose  Past Medical History:  Past Medical History:  Diagnosis Date  . ADHD   . Anxiety   . Chronic back pain    from broken back  . Depression   . Right femoral fracture ([place])   . Kelli Navarro    may show low Hgb  . Seizures (Kelli Navarro)    has during sleep  . Vitamin B 12 deficiency     Past Surgical History:  Procedure Laterality Date  . BACK SURGERY    . none    . radial frequency ablasion  05/2019   back  . 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 TOTAL HIP ARTHROPLASTY Right 08/16/2020   Procedure: TOTAL HIP ARTHROPLASTY ANTERIOR APPROACH;  Surgeon: Rod Can, MD;  Location: [facility];  Service: Orthopedics;  Laterality: Right;   Family History:  Family History  Problem Relation Age of Onset  . Multiple sclerosis Mother   . Heart Problems Mother        by-pass  .  Alcohol abuse Mother   . Heart Problems Sister        pacemaker   Family Psychiatric  History: Denies any family psychiatric history but patient's partner reports depression patient sister.  Social History: Patient denies any alcohol use, tobacco use or any other drug abuse.    Additional Social History: Patient has been married 3 times.  She has 2 kids from first marriage second and from second 25 and started living with partner [name] 20 years ago, got married to her~2015.  They live in the same house but on different floors.   Sleep: Poor  Appetite:  Fair  Current Medications: Current Facility-Administered Medications  Medication Dose Route Frequency Provider Last Rate Last Admin  . aspirin chewable tablet 81 mg  81 mg Oral BID Virl Axe, MD   81 mg at 09/12/20 0859  . atropine 1 MG/10ML injection 0.5 mg  0.5 mg Intravenous PRN Gaylan Gerold, DO      . clonazePAM Bobbye [place]) tablet 1 mg  1 mg Oral QID Madalyn Rob, MD   1 mg at 09/12/20 0858  . enoxaparin (LOVENOX) injection 40 mg  40 mg Subcutaneous QHS Gaylan Gerold, DO   40 mg at 09/11/20 2128  . gabapentin (NEURONTIN) capsule 400 mg  400 mg Oral TID Virl Axe, MD   400 mg at 09/12/20 0859  . mirtazapine (REMERON) tablet 7.5 mg  7.5 mg Oral QHS Dagar,  Kelli Staggers, MD   7.5 mg at 09/11/20 2128  . topiramate (TOPAMAX) tablet 200 mg  200 mg Oral BID Virl Axe, MD   200 mg at 09/12/20 0858    Lab Results:  CBC Latest Ref Rng & Units 09/11/2020 09/10/2020 09/09/2020  WBC 4.0 - 10.5 K/uL 3.4(L) 2.6(L) 2.3(L)  Hemoglobin 12.0 - 15.0 g/dL 8.8(L) 9.0(L) 8.9(L)  Hematocrit 36.0 - 46.0 % 28.4(L) 29.2(L) 30.4(L)  Platelets 150 - 400 K/uL 380 353 299(M)   Metabolic Disorder Labs: Lab Results  Component Value Date   HGBA1C 4.8 03/27/2017   MPG 91.06 03/27/2017   Musculoskeletal: Strength & Muscle Tone: within normal limits Gait & Station: normal Patient leans: N/A  Psychiatric Specialty Exam: Physical Exam Vitals and nursing note  reviewed.  Constitutional:      Appearance: Normal appearance. She is normal weight.  HENT:     Head: Normocephalic and atraumatic.     Nose: Nose normal.     Mouth/Throat:     Mouth: Mucous membranes are moist.  Cardiovascular:     Rate and Rhythm: Normal rate and regular rhythm.  Pulmonary:     Effort: Pulmonary effort is normal.  Musculoskeletal:     Cervical back: Normal range of motion.  Neurological:     Mental Status: She is alert and oriented to person, place, and time. Mental status is at baseline.     Review of Systems  Blood pressure (!) 150/91, pulse (!) 104, temperature 98.8 F (37.1 C), temperature source Oral, resp. rate 19, weight 57.3 kg, SpO2 96 %.Body mass index is 20.39 kg/m.  General Appearance: Casual  Eye Contact:  Good  Speech:  Normal Rate  Volume:  Normal  Mood:  Interactive, good  Affect:  Appropriate  Thought Process:  Coherent  Orientation:  Full (Time, Place, and Person)  Thought Content:  Logical  Suicidal Thoughts:  No  Homicidal Thoughts:  No  Memory:  Immediate;   Good Recent;   Fair Remote;   Fair  Judgement:  Good  Insight:  Good  Psychomotor Activity:  Normal  Concentration:  Concentration: Good and Attention Span: Good  Recall:  Good  Fund of Knowledge:  Good  Language:  Good  Akathisia:  No  Handed:  Right  AIMS (if indicated):     Assets:  Communication Skills Desire for Improvement Resilience  ADL's:  Impaired  Cognition:  WNL  Sleep:      Assessment: 70 year old admitted after suicide attempt by overdose on gabapentin, doxepin, clonidine and clonazepam. #Suicide attempt by medication overdose #Major depressive disorder #Generalized anxiety disorder -On evaluation today patient denies any suicidal ideations or homicidal ideations.  She is interactive and in good mood but anxious about her conversation with her partner about her future.  Plans to talk to her partner today.  Denies any side effect from mirtazapine and  agrees to continue it.  Complains of poor sleep but medical team has started Klonopin and that should help with sleep and her anxiety. -Patient is at high risk for another suicide attempt as she has multiple risk factors like prior suicide attempt, her age, race, social isolation, chronic medical illnesses, family discord, financial strains so it would be wise to continue 1: 1 sitter for now. -Physical therapy suggest follow-up therapist as suggested by orthopedic surgeon at home after discharge.  Treatment Plan: --Continue Remeron 7.5 mg nightly to help with depression, appetite and sleep --Appreciate social worker assistance with Ger[facility] psych placement and would recommend to start  process for [employer]. --Continue 1: 1 sitter as patient has multiple risk factor for another suicide attempt and possibility of low mood due to family conversations. --Daily contact with patient to assess and evaluate symptoms and progress in treatment --No other recommendations at this time continue but psychiatry will continue to follow.  Disposition: --Recommend Geri psych inpatient admission when medically cleared.  Honor Junes, MD 09/12/2020, 10:34 AM  PGY-1, Resident

## 2020-09-13 DIAGNOSIS — T465X2A Poisoning by other antihypertensive drugs, intentional self-harm, initial encounter: Secondary | ICD-10-CM | POA: Diagnosis not present

## 2020-09-13 DIAGNOSIS — F332 Major depressive disorder, recurrent severe without psychotic features: Secondary | ICD-10-CM | POA: Diagnosis not present

## 2020-09-13 DIAGNOSIS — T50902A Poisoning by unspecified drugs, medicaments and biological substances, intentional self-harm, initial encounter: Secondary | ICD-10-CM | POA: Diagnosis not present

## 2020-09-13 DIAGNOSIS — T1491XA Suicide attempt, initial encounter: Secondary | ICD-10-CM | POA: Diagnosis not present

## 2020-09-13 DIAGNOSIS — R000 Tachycardia, unspecified: Secondary | ICD-10-CM

## 2020-09-13 DIAGNOSIS — F411 Generalized anxiety disorder: Secondary | ICD-10-CM | POA: Diagnosis not present

## 2020-09-13 DIAGNOSIS — T426X2A Poisoning by other antiepileptic and sedative-hypnotic drugs, intentional self-harm, initial encounter: Secondary | ICD-10-CM | POA: Diagnosis not present

## 2020-09-13 LAB — TSH: TSH: 0.906 u[IU]/mL (ref 0.350–4.500)

## 2020-09-13 LAB — COMPREHENSIVE METABOLIC PANEL
ALT: 10 U/L (ref 0–44)
AST: 17 U/L (ref 15–41)
Albumin: 3.6 g/dL (ref 3.5–5.0)
Alkaline Phosphatase: 111 U/L (ref 38–126)
Anion gap: 7 (ref 5–15)
BUN: 8 mg/dL (ref 8–23)
CO2: 23 mmol/L (ref 22–32)
Calcium: 9.5 mg/dL (ref 8.9–10.3)
Chloride: 111 mmol/L (ref 98–111)
Creatinine, Ser: 0.84 mg/dL (ref 0.44–1.00)
GFR, Estimated: >60 mL/min (ref >60)
Glucose, Bld: 99 mg/dL (ref 70–99)
Potassium: 4.5 mmol/L (ref 3.5–5.1)
Sodium: 141 mmol/L (ref 135–145)
Total Bilirubin: 0.3 mg/dL (ref 0.3–1.2)
Total Protein: 7.5 g/dL (ref 6.5–8.1)

## 2020-09-13 LAB — GLUCOSE, CAPILLARY: Glucose-Capillary: 97 mg/dL (ref 70–99)

## 2020-09-13 LAB — SARS CORONAVIRUS 2 (TAT 6-24 HRS): SARS Coronavirus 2: NEGATIVE

## 2020-09-13 MED ORDER — SODIUM CHLORIDE 0.9 % IV BOLUS
500.0000 mL | Freq: Once | INTRAVENOUS | Status: AC
  Administered 2020-09-13: 500 mL via INTRAVENOUS

## 2020-09-13 MED ORDER — GABAPENTIN 100 MG PO CAPS
100.0000 mg | ORAL_CAPSULE | Freq: Every morning | ORAL | Status: DC
  Administered 2020-09-13 – 2020-09-14 (×2): 100 mg via ORAL
  Filled 2020-09-13 (×2): qty 1

## 2020-09-13 MED ORDER — GABAPENTIN 300 MG PO CAPS
300.0000 mg | ORAL_CAPSULE | Freq: Every day | ORAL | Status: DC
  Administered 2020-09-13: 300 mg via ORAL
  Filled 2020-09-13: qty 1

## 2020-09-13 MED ORDER — LACTATED RINGERS IV SOLN: INTRAVENOUS | Status: AC

## 2020-09-13 NOTE — Progress Notes (Signed)
Physical Therapy Treatment Patient Details Name: Kelli Navarro MRN: [id number] DOB: 01-Jul-1951 Today's Date: 09/13/2020    History of Present Illness Pt adm after overdose for suicide attempt. Pt with recent rt hip fx and underwent rt THR using anterior approach on 08/16/20. PMH - lt THR, ADD, back surgery, depression, anxiety.    PT Comments    Pt received in supine with sitter present, pt A&O with good participation and tolerance for mobility aside from elevated HR, RN aware of sinus tach and pt denies symptoms during mobility tasks. Pt performed transfers at modI level and had 1 minor LOB but able to correct with furniture/min guard, otherwise pt Supervision for community distance ambulation task and stair training with no assistive device. Pt continues to benefit from PT services to progress toward functional mobility goals and will plan to perform DGI for higher level balance assessment next date. DC recs below remain appropriate, anticipate pt safe to discharge to behavioral hospital once medically cleared.   Follow Up Recommendations  Follow surgeon's recommendation for DC plan and follow-up therapies (After pt returns home resume orthopedic surgeon recs for continued PT.)     Equipment Recommendations  None recommended by PT    Recommendations for Other Services       Precautions / Restrictions Precautions Precautions: Fall;Other (comment) (suicide) Precaution Comments: suicide precautions 1 on 1 sitter; sinus tach on 3/10 Restrictions Weight Bearing Restrictions: No    Mobility  Bed Mobility Overal bed mobility: Modified Independent                  Transfers Overall transfer level: Modified independent               General transfer comment: reaching for objects in her environment from time to time  Ambulation/Gait Ambulation/Gait assistance: Min guard;Supervision Gait Distance (Feet): 300 Feet Assistive device: None Gait Pattern/deviations:  Step-through pattern;Decreased stride length;Decreased weight shift to right;Narrow base of support Gait velocity: decr Gait velocity interpretation: 1.31 - 2.62 ft/sec, indicative of limited community ambulator General Gait Details: 1 mild lateral LOB to L side needing min guard/wall support to correct but Supervision afterward with no AD and no further LOB   Stairs Stairs: Yes Stairs assistance: Supervision Stair Management: One rail Left;Step to pattern;Forwards Number of Stairs: 10 General stair comments: cues for activity pacing due to elevated HR, per monitor sinus tach and denies dizziness/not sweating/asx; cues for step sequencing for safety, no LOB   Wheelchair Mobility    Modified Rankin (Stroke Patients Only)       Balance Overall balance assessment: Mild deficits observed, not formally tested             Standing balance comment: Mild unsteadiness noted with mobility in the halls.  RW is a good option but pt declines this date and mostly Supervision for safety during hallway ambulation. Plan to perform DGI next date.                            Cognition Arousal/Alertness: Awake/alert Behavior During Therapy: WFL for tasks assessed/performed Overall Cognitive Status: Within Functional Limits for tasks assessed                                 General Comments: A&O, reasoning seems intact, mild anxiety but cooperative      Exercises      General Comments General comments (  skin integrity, edema, etc.): HR elevated, otherwise VSS/SpO2 WNL on RA      Pertinent Vitals/Pain Pain Assessment: No/denies pain    Home Living                      Prior Function            PT Goals (current goals can now be found in the care plan section) Acute Rehab PT Goals Patient Stated Goal: To go to psychiatric facility then home PT Goal Formulation: With patient Time For Goal Achievement: 09/25/20 Potential to Achieve Goals:  Good Progress towards PT goals: Progressing toward goals    Frequency    Min 3X/week      PT Plan Current plan remains appropriate    Co-evaluation              AM-PAC PT "6 Clicks" Mobility   Outcome Measure  Help needed turning from your back to your side while in a flat bed without using bedrails?: None Help needed moving from lying on your back to sitting on the side of a flat bed without using bedrails?: None Help needed moving to and from a bed to a chair (including a wheelchair)?: None Help needed standing up from a chair using your arms (e.g., wheelchair or bedside chair)?: None Help needed to walk in hospital room?: A Little Help needed climbing 3-5 steps with a railing? : A Little 6 Click Score: 22    End of Session Equipment Utilized During Treatment: Gait belt Activity Tolerance: Patient tolerated treatment well;Other (comment) (HR elevated, RN notified) Patient left: in bed;with call bell/phone within reach;with bed alarm set;with nursing/sitter in room Nurse Communication: Mobility status;Other (comment) (tachycardia) PT Visit Diagnosis: Unsteadiness on feet (R26.81);Other abnormalities of gait and mobility (R26.89)     Time: 6384-6659 PT Time Calculation (min) (ACUTE ONLY): 19 min  Charges:  $Gait Training: 8-22 mins                     Takyra Cantrall P., PTA Acute Rehabilitation Services Pager: [phone] Office: [place] 09/13/2020, 4:45 PM

## 2020-09-13 NOTE — Discharge Summary (Addendum)
Name: Kelli Navarro MRN: [id number] DOB: [birth year] 70 y.o. PCP: Rubie Maid, MD  Date of Admission: 09/08/2020  5:56 PM Date of Discharge:  09/14/2020 Attending Physician: Axel Filler, *  Discharge Diagnosis: 1. Attempted suicide by intentional overdose of medications  2. Depression/anxiety with suicidal intent  Discharge Medications: Allergies as of 09/14/2020      Reactions   Other Anaphylaxis   Allergy to steroids causing anaphylaxis     Aptiom [eslicarbazepine] Other (See Comments)   Unknown reaction   Azithromycin Other (See Comments)   Unknown reaction   Bupropion Other (See Comments)    Caused depression   Carbamazepine Other (See Comments)   Unknown reaction   Dilantin [phenytoin Sodium Extended] Other (See Comments)   Unknown reaction   Diphenhydramine Other (See Comments)   Feels like "spiders are crawling" on her   Divalproex Sodium Other (See Comments)   Unknown reaction   Keppra [levetiracetam] Other (See Comments)   Unknown reaction   Lamictal [lamotrigine] Other (See Comments)   Unknown reaction   Lorazepam Swelling, Other (See Comments)   Hallucinations   Lyrica [pregabalin] Other (See Comments)   Pt reports feeling loopy and confused   Morphine And Related Other (See Comments)   Unknown reaction - (high doses)   Prednisone Other (See Comments)   Unknown reaction   Vimpat [lacosamide] Other (See Comments)   Unknown reaction      Medication List    STOP taking these medications   amphetamine-dextroamphetamine 20 MG tablet Commonly known as: ADDERALL   cloNIDine 0.1 MG tablet Commonly known as: CATAPRES   doxepin 10 MG capsule Commonly known as: SINEQUAN     TAKE these medications   acetaminophen 500 MG tablet Commonly known as: TYLENOL Take 500 mg by mouth every 6 (six) hours as needed (pain).   aspirin 81 MG chewable tablet Chew 1 tablet (81 mg total) by mouth 2 (two) times daily.   clonazePAM 2 MG  tablet Commonly known as: KLONOPIN Take 1/2 tablet 4 times a day What changed:   how much to take  how to take this  when to take this  additional instructions   docusate sodium 100 MG capsule Commonly known as: COLACE Take 1 capsule (100 mg total) by mouth 2 (two) times daily.   gabapentin 100 MG capsule Commonly known as: NEURONTIN Take 400 mg by mouth 3 (three) times daily.   ipratropium 0.06 % nasal spray Commonly known as: ATROVENT Place 2 sprays into both nostrils at bedtime.   mirtazapine 7.5 MG tablet Commonly known as: REMERON Take 1 tablet (7.5 mg total) by mouth at bedtime.   multivitamin with minerals Tabs tablet Take 1 tablet by mouth daily.   ondansetron 4 MG tablet Commonly known as: ZOFRAN Take 1 tablet (4 mg total) by mouth every 6 (six) hours as needed for nausea.   senna 8.6 MG Tabs tablet Commonly known as: SENOKOT Take 2 tablets (17.2 mg total) by mouth at bedtime.   topiramate 100 MG tablet Commonly known as: TOPAMAX Take 2 tabs twice a day What changed:   how much to take  how to take this  when to take this  additional instructions       Disposition and follow-up:   Ms.Kameren A Naeve was discharged from [facility] in [place] condition.  At the hospital follow up visit please address:  1.  Attempted suicide via intentional overdose of medications. Depression/anxiety with suicidal intent. Overdosed on gabapentin,  doxepin, clonidine, and clonazepam. Started on mirtazipine qhs by psychiatry. Discharging to inpatient psychiatry at [facility] today. Of note, patient and spouse going through possible divorce which likely prompted suicide attempt. Will hold her home medications (clonidine, doxepin, and adderall) at discharge. Continued clonazepam (prevent benzo withdrawal) and mirtazipine. Will defer to [facility] to optimize patient's medications.  2.  Labs / imaging needed at time of follow-up: None  3.   Pending labs/ test needing follow-up: None  Follow-up Appointments:   Hospital Course by problem list: 1.  Attempted suicide via intentional overdose of medications. Depression/anxiety with suicidal intent. Patient arrived via EMS with acute encephalopathy after intentional drug overdose with gabapentin, clonidine, doxepin, and clonazepam. Patient was initially obtunded with glasgow coma score of 3 and bradycardic. She was placed on cardiac monitoring, suicide and aspiration precautions, q4h neurochecks. Poison control was contacted who recommended atropine prn for bradycardia, q4h EKGs to monitor for QTc prolongation, and IV fluids for hypotension. Received a total of 3 doses of atropine after arrival. EKGs were without QTc prolongation throughout duration of admission and she was not hypotensive. Acute encephalopathy and bradycardia resolved over the following 2 days, likely with elimination of ingested medications. SLP cleared patient for regular diet. Psychiatry was consulted to evaluate patient due to suicide attempt and intentional medication overdose. Started on mirtazipine qhs and recommended admission to an inpatient geri-psych unit. Patient has been able to obtain a bed offer at [facility] with plan to discharge today. Continued clonazepam to avoid precipitating benzodiazepine withdrawal, but holding home clonidine, doxepin, and adderall. Will defer to [place] to optimize patient's medication list. Also, continuing mirtazipine for patient at this time, will also defer to [place] to continue or adjust this medication.  Of note, patient and spouse have been having domestic issues. As per patient, her spouse told her that she wanted a divorce and this was what prompted her to attempt suicide. Throughout duration of admission, it became clear that spouse and patient will not likely be together going forward and are heading in the direction of a divorce.   Discharge Exam:   BP (!)  135/91 (BP Location: Left Arm)   Pulse 91   Temp 98.5 F (36.9 C) (Oral)   Resp 18   Wt 53.2 kg   SpO2 96%   BMI 18.92 kg/m  Discharge exam:  General: elderly female, sitting up in bed, NAD. HENT: mucous membrane moist Eyes: PERRL, EOMI CV: slightly tachycardic rate and regular rhythm, no m/r/g. Pulm: CTABL, no adventitious sounds noted. MSK: no peripheral edema noted bilaterally Skin: warm and dry Neuro: AAOx4, no focal deficits noted.  Pertinent Labs, Studies, and Procedures:  CBC Latest Ref Rng & Units 09/11/2020 09/10/2020 09/09/2020  WBC 4.0 - 10.5 K/uL 3.4(L) 2.6(L) 2.3(L)  Hemoglobin 12.0 - 15.0 g/dL 8.8(L) 9.0(L) 8.9(L)  Hematocrit 36.0 - 46.0 % 28.4(L) 29.2(L) 30.4(L)  Platelets 150 - 400 K/uL 380 378 423(H)   CMP Latest Ref Rng & Units 09/13/2020 09/11/2020 09/10/2020  Glucose 70 - 99 mg/dL 99 89 75  BUN 8 - 23 mg/dL 8 9 9   Creatinine 0.44 - 1.00 mg/dL 0.84 0.66 0.56  Sodium 135 - 145 mmol/L 141 139 140  Potassium 3.5 - 5.1 mmol/L 4.5 3.7 4.3  Chloride 98 - 111 mmol/L 111 105 111  CO2 22 - 32 mmol/L 23 28 21(L)  Calcium 8.9 - 10.3 mg/dL 9.5 8.8(L) 8.8(L)  Total Protein 6.5 - 8.1 g/dL 7.5 5.6(L) 5.6(L)  Total  Bilirubin 0.3 - 1.2 mg/dL 0.3 0.3 0.5  Alkaline Phos 38 - 126 U/L 111 86 86  AST 15 - 41 U/L 17 14(L) 14(L)  ALT 0 - 44 U/L 10 10 9    Lactic Acid, Venous    Component Value Date/Time   LATICACIDVEN 1.1 09/08/2020 2137    Urinalysis    Component Value Date/Time   COLORURINE STRAW (A) 09/08/2020 2133   APPEARANCEUR CLEAR 09/08/2020 2133   LABSPEC 1.012 09/08/2020 2133   PHURINE 6.0 09/08/2020 2133   GLUCOSEU NEGATIVE 09/08/2020 2133   HGBUR NEGATIVE 09/08/2020 2133   BILIRUBINUR NEGATIVE 09/08/2020 2133   KETONESUR NEGATIVE 09/08/2020 2133   PROTEINUR NEGATIVE 09/08/2020 2133   UROBILINOGEN 0.2 12/21/2014 1740   NITRITE NEGATIVE 09/08/2020 2133   LEUKOCYTESUR NEGATIVE 09/08/2020 2133    Drugs of Abuse     Component Value Date/Time   LABOPIA NONE  DETECTED 09/08/2020 2134   COCAINSCRNUR NONE DETECTED 09/08/2020 2134   COCAINSCRNUR Negative 03/21/2017 0832   LABBENZ POSITIVE (A) 09/08/2020 2134   AMPHETMU POSITIVE (A) 09/08/2020 2134   THCU NONE DETECTED 09/08/2020 2134   LABBARB NONE DETECTED 09/08/2020 2134   Acetaminophen level - 35 > 18 Salicylate level - 8.7 Ethanol level <10   TSH - 0.906   Discharge Instructions: Discharge Instructions    Call MD for:  difficulty breathing, headache or visual disturbances   Complete by: As directed    Call MD for:  extreme fatigue   Complete by: As directed    Call MD for:  hives   Complete by: As directed    Call MD for:  persistant dizziness or light-headedness   Complete by: As directed    Call MD for:  persistant nausea and vomiting   Complete by: As directed    Call MD for:  redness, tenderness, or signs of infection (pain, swelling, redness, odor or green/yellow discharge around incision site)   Complete by: As directed    Call MD for:  severe uncontrolled pain   Complete by: As directed    Call MD for:  temperature >100.4   Complete by: As directed    Diet - low sodium heart healthy   Complete by: As directed    Discharge instructions   Complete by: As directed    Ms Guallpa, it was a pleasure taking care of you while you were here.  Please take note of the following information:  1. We are sending you to [facility], a geriatric-psychiatry facility for further care.  2. You were started on Mirtazipine while here so please continue this unless if [facility] advises differently.   Increase activity slowly   Complete by: As directed       Signed: Virl Axe, MD 09/14/2020, 10:18 AM   Pager: [phone]

## 2020-09-13 NOTE — Progress Notes (Addendum)
Psychiatry Progress Note  09/13/2020 2:51 PM Kelli Navarro  MRN:  [id number]   Subjective: No acute overnight events.  Patient evaluated at bedside this morning, with sitter present in the room. Patient denies any new complaints and states her mood is fine.  She denies any suicidal ideations or homicidal ideations.  She states she would go to inpatient psych voluntarily but she would rather go back to her home with some resources.  She plans to talk to her partner again today.  She states she understands the complexity of her future plans but she is going to deal with that and will keep her conversation going on with her partner.   Principal Problem: Suicide attempt by drug overdose ([place]) Diagnosis: Principal Problem:   Suicide attempt by drug overdose ([place]) Active Problems:   Overdose   Obtundation   Severe episode of recurrent major depressive disorder, without psychotic features ([place])   Generalized anxiety disorder  Total Time spent with patient: 25 minutes  Past Psychiatric History: Depressive disorder, generalized anxiety disorder, ADHD, previous suicide attempt with overdose  Past Medical History:  Past Medical History:  Diagnosis Date  . ADHD   . Anxiety   . Chronic back pain    from broken back  . Depression   . Right femoral fracture ([place])   . [facility] spotted fever    may show low Hgb  . Seizures ([place])    has during sleep  . Vitamin B 12 deficiency     Past Surgical History:  Procedure Laterality Date  . BACK SURGERY    . none    . radial frequency ablasion  05/2019   back  . 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 TOTAL HIP ARTHROPLASTY Right 08/16/2020   Procedure: TOTAL HIP ARTHROPLASTY ANTERIOR APPROACH;  Surgeon: Rod Can, MD;  Location: [facility];  Service: Orthopedics;  Laterality: Right;    Family History:  Family History  Problem Relation Age of Onset  . Multiple sclerosis Mother   . Heart Problems Mother        by-pass  . Alcohol abuse Mother   . Heart Problems Sister        pacemaker   Family Psychiatric  History: Denies any family psychiatric history but patient's partner reports depression patient sister.  Social History: Patient denies any alcohol use, tobacco use or any other drug abuse.    Additional Social History: Patient has been married 3 times.  She has 2 kids from first marriage second and from second 71 and started living with partner [name] 20 years ago, got married to her~2015.  They live in the same house but on different floors.   Sleep: Poor  Appetite:  Fair  Current Medications: Current Facility-Administered Medications  Medication Dose Route Frequency Provider Last Rate Last Admin  . aspirin chewable tablet 81 mg  81 mg Oral BID Virl Axe, MD   81 mg at 09/13/20 0915  . atropine 1 MG/10ML injection 0.5 mg  0.5 mg Intravenous PRN Gaylan Gerold, DO      . clonazePAM Bobbye [place]) tablet 1 mg  1 mg Oral QID Madalyn Rob, MD   1 mg at 09/13/20 0915  . enoxaparin (LOVENOX) injection 40 mg  40 mg Subcutaneous QHS Gaylan Gerold, DO   40 mg at 09/12/20 2222  . gabapentin (NEURONTIN) capsule 100 mg  100  mg Oral q morning Virl Axe, MD   100 mg at 09/13/20 1256  . gabapentin (NEURONTIN) capsule 300 mg  300 mg Oral QHS Virl Axe, MD      . mirtazapine (REMERON) tablet 7.5 mg  7.5 mg Oral QHS Dayle Mcnerney, Meredith Staggers, MD   7.5 mg at 09/12/20 2219  . topiramate (TOPAMAX) tablet 200 mg  200 mg Oral BID Virl Axe, MD   200 mg at 09/13/20 0915    Lab Results:  CBC Latest Ref Rng & Units 09/11/2020 09/10/2020 09/09/2020  WBC 4.0 - 10.5 K/uL 3.4(L) 2.6(L) 2.3(L)  Hemoglobin 12.0 - 15.0 g/dL 8.8(L) 9.0(L) 8.9(L)  Hematocrit 36.0 - 46.0 % 28.4(L) 29.2(L) 30.4(L)  Platelets 150 - 400 K/uL 380 381 017(P)   Metabolic Disorder Labs: Lab Results  Component Value  Date   HGBA1C 4.8 03/27/2017   MPG 91.06 03/27/2017   Musculoskeletal: Strength & Muscle Tone: within normal limits Gait & Station: normal Patient leans: N/A  Psychiatric Specialty Exam: Physical Exam Vitals and nursing note reviewed.  Constitutional:      Appearance: Normal appearance. She is normal weight.  HENT:     Head: Normocephalic and atraumatic.     Nose: Nose normal.     Mouth/Throat:     Mouth: Mucous membranes are moist.  Cardiovascular:     Rate and Rhythm: Normal rate and regular rhythm.  Pulmonary:     Effort: Pulmonary effort is normal.  Musculoskeletal:     Cervical back: Normal range of motion.  Neurological:     Mental Status: She is alert and oriented to person, place, and time. Mental status is at baseline.     Review of Systems  Blood pressure 140/89, pulse (!) 102, temperature 98.1 F (36.7 C), temperature source Oral, resp. rate 16, weight 53.2 kg, SpO2 98 %.Body mass index is 18.92 kg/m.  General Appearance: Casual  Eye Contact:  Good  Speech:  Normal Rate  Volume:  Normal  Mood:  Interactive, good  Affect:  Appropriate  Thought Process:  Coherent  Orientation:  Full (Time, Place, and Person)  Thought Content:  Logical  Suicidal Thoughts:  No  Homicidal Thoughts:  No  Memory:  Immediate;   Good Recent;   Fair Remote;   Fair  Judgement:  Good  Insight:  Good  Psychomotor Activity:  Normal  Concentration:  Concentration: Good and Attention Span: Good  Recall:  Good  Fund of Knowledge:  Good  Language:  Good  Akathisia:  No  Handed:  Right  AIMS (if indicated):     Assets:  Communication Skills Desire for Improvement Resilience  ADL's:  Impaired  Cognition:  WNL  Sleep:      Assessment: 70 year old admitted after suicide attempt by overdose on gabapentin, doxepin, clonidine and clonazepam. #Suicide attempt by medication overdose #Major depressive disorder #Generalized anxiety disorder -On evaluation today patient denies any  suicidal ideations or homicidal ideations.  She states her mood is fine but she feels anxious about her future.  She looks better, interactive and aware of her complicated situation. -Patient is at high risk for another suicide attempt as she has multiple risk factors like prior suicide attempt, her age, race, social isolation, chronic medical illnesses, family discord, financial strains so it would be wise to continue 1: 1 sitter for now. -Physical therapy suggest follow-up therapist as suggested by orthopedic surgeon at home after discharge. -Social worker informed that patient has been accepted at [facility] and will be transferred  there tomorrow.  Treatment Plan: --Continue Remeron 7.5 mg nightly to help with depression, appetite and sleep --Appreciate social worker assistance with Geri psych placement  --Continue 1: 1 sitter as patient has multiple risk factor for another suicide attempt and possibility of low mood due to family conversations. --Daily contact with patient to assess and evaluate symptoms and progress in treatment --Psychiatry is signing off, but will be available for any further questions  Disposition: --Patient accepted at [facility].  Honor Junes, MD 09/13/2020, 2:51 PM  PGY-1, Resident

## 2020-09-13 NOTE — Progress Notes (Signed)
   Subjective:   No acute overnight events.  Feeling very good today. Has been able to walk around. Asked when she will be able to go to inpatient psych facility.  Objective:  Vital signs in last 24 hours: Vitals:   09/13/20 0000 09/13/20 0419 09/13/20 0500 09/13/20 0835  BP: (!) 145/89 (!) 147/91  140/89  Pulse: 100 98  (!) 115  Resp: 19 18 19 15   Temp: 98.8 F (37.1 C) 98.3 F (36.8 C)  98.2 F (36.8 C)  TempSrc: Oral Oral  Oral  SpO2: 97% 98%  97%  Weight:   53.2 kg    Physical Exam: General: elderly female, lying in bed, NAD. CV: slightly tachycardic rate, regular rhythm, no m/r/g. Neuro: AAOx4, no focal deficits noted.    Assessment/Plan:  Principal Problem:   Suicide attempt by drug overdose [facility]) Active Problems:   Overdose   Obtundation   Severe episode of recurrent major depressive disorder, without psychotic features ([place])   Generalized anxiety disorder  Kelli Navarro is a 70 year old female with history of ADHD, anxiety, depression, chronic back pain, seizure who presented to the ED via EMS for altered mental status from overdose of home medications (gabapentin, doxepin, clonidine, clonazepam), now awaiting placement to inpatient geri-psych.  Intentional Overdose of medications (gabapentin, doxepin, clonidine, clonazepam) Depression/anxiety with suicidal intent Acute encephalopathy - resolved Bradycardia - resolved Medically stable for discharge, pending admission to inpatient geri-psych unit as she is a high risk for completed suicide as per psychiatry. Continuing mirtazipine as per psych. Seen by PT/OT, no recommended follow up needed. Tolerating regular diet well. Appreciate TOC assistance with admission to inpatient geri-psych unit.  Sinus Tachycardia HR in 110s today. EKG showing sinus tachycardia. Could be from dehydration or from possible rebound tachycardia from being off of home clonidine. Blood pressures have been good, so will not restart  clonidine at this time. Will give IV fluid bolus and assess response.  Prior to Admission Living Arrangement: Home Anticipated Discharge Location: inpatient psychiatry Barriers to Discharge: none Dispo: Anticipated discharge in approximately 1-2 day(s).   Virl Axe, MD 09/13/2020, 11:48 AM Pager: [phone] After 5pm on weekdays and 1pm on weekends: On Call pager [phone]

## 2020-09-13 NOTE — TOC Progression Note (Signed)
Transition of Care [facility]) - Progression Note    Patient Details  Name: Kelli Navarro MRN: [id number] Date of Birth: Jan 29, 1951  Transition of Care [facility]) CM/SW [place], [place] Phone Number: 09/13/2020, 4:29 PM  Clinical Narrative:     CSW visit with patient at bedside. CSW informed patient will d/c to [facility]. Patient remains agreeable. Patient gave CSW permission to inform her wife [name].   CSW called patient's wife- informed of psych placement- answered all questions.  Thurmond Butts, MSW, LCSW Clinical Social Worker   Expected Discharge Plan: Psychiatric Hospital Barriers to Discharge: Continued Medical Work up (psych bed offer, homelessness, assistance w/ADL,)  Expected Discharge Plan and Services Expected Discharge Plan: [facility] In-house Referral: Clinical Social Work     Living arrangements for the past 2 months: Single Family Home                                       Social Determinants of Health (SDOH) Interventions    Readmission Risk Interventions No flowsheet data found.

## 2020-09-13 NOTE — TOC Progression Note (Signed)
Transition of Care [facility]) - Progression Note    Patient Details  Name: Kelli Navarro MRN: [id number] Date of Birth: 1951-02-04  Transition of Care [facility]) CM/SW [place], [place] Phone Number: 09/13/2020, 2:45 PM  Clinical Narrative:     [phone]) has accepted patient for admission- will d/c tomorrow  RN updated - covid test requested  RN report # [phone] Receiving MD -Rano Juliane Lack    CSW will fax discharge summary and negative test results to [phone] CSW will continue to follow and assist with discharge planning.  Thurmond Butts, MSW, LCSW Clinical Social Worker    Expected Discharge Plan: Psychiatric Hospital Barriers to Discharge: Continued Medical Work up (psych bed offer, homelessness, assistance w/ADL,)  Expected Discharge Plan and Services Expected Discharge Plan: [facility] In-house Referral: Clinical Social Work     Living arrangements for the past 2 months: Single Family Home                                       Social Determinants of Health (SDOH) Interventions    Readmission Risk Interventions No flowsheet data found.

## 2020-09-13 NOTE — TOC Progression Note (Signed)
Transition of Care [facility]) - Progression Note    Patient Details  Name: Kelli Navarro MRN: [id number] Date of Birth: [birth year]  Transition of Care [facility]) CM/SW [place], [place] Phone Number: 09/13/2020, 1:58 PM  Clinical Narrative:     CSW sent Gero-Psych referrals faxed to- [place], [facility], [place], [facility], [place] waiting on response   CSW will continue to follow and assist with discharge planning.   Expected Discharge Plan: Psychiatric Hospital Barriers to Discharge: Continued Medical Work up (psych bed offer, homelessness, assistance w/ADL,)  Expected Discharge Plan and Services Expected Discharge Plan: [facility] In-house Referral: Clinical Social Work     Living arrangements for the past 2 months: Single Family Home                                       Social Determinants of Health (SDOH) Interventions    Readmission Risk Interventions No flowsheet data found.

## 2020-09-13 NOTE — Plan of Care (Signed)

## 2020-09-13 NOTE — Progress Notes (Signed)
Pt states that we are giving her too gabapentin.  States 400 mg three times a day is not what she takes at home.  States she normally takes 100 mg during day and 300 mg at night time.  She refused morning dose of 400 mg gabapentin as this causes "bad headache".  Dr. Allyson Sabal made aware. Pt in room with sitter at this time. Payton Emerald, RN

## 2020-09-13 NOTE — TOC Progression Note (Signed)
Transition of Care [facility]) - Progression Note    Patient Details  Name: [name] MRN: [id number] Date of Birth: 1950/11/05  Transition of Care [facility]) CM/SW [place], [place] Phone Number: 09/13/2020, 2:09 PM  Clinical Narrative:    CSW called -  [facility]- hard faxed referral to review - waiting on response  [place]- hard faxed referral- they are reviewing and will get back w/ CSW [facility]- no beds available  [facility] - no answer  [facility]- left voice message  [facility]- mail box full unable to leave message   CSW will continue to follow and see Gero-psych placement.  Thurmond Butts, MSW, LCSW Clinical Social Worker    Expected Discharge Plan: Psychiatric Hospital Barriers to Discharge: Continued Medical Work up (psych bed offer, homelessness, assistance w/ADL,)  Expected Discharge Plan and Services Expected Discharge Plan: [facility] In-house Referral: Clinical Social Work     Living arrangements for the past 2 months: Single Family Home                                       Social Determinants of Health (SDOH) Interventions    Readmission Risk Interventions No flowsheet data found.

## 2020-09-13 NOTE — Progress Notes (Signed)
Partner requesting update on all current medication that pt is getting.  Wants to know why she is not getting adderall and what she has been given to calm her down.  Spoke with patient and spouse concerning medications and updates to gabapentin. Spouse states that patient can not stay in hospital 30-40 days awaiting psych placement.  Spouse states that she is agreeable to patient coming home "but there are going to be some rules". Pt nods and appears to be agreeable as she wants to go home. Spouse states that she can not come home yet because she needs to get "stuff ready".  MD made aware and will contact spouse and patient in room. Kelli Emerald, RN

## 2020-09-14 DIAGNOSIS — T426X2A Poisoning by other antiepileptic and sedative-hypnotic drugs, intentional self-harm, initial encounter: Secondary | ICD-10-CM | POA: Diagnosis not present

## 2020-09-14 DIAGNOSIS — T1491XA Suicide attempt, initial encounter: Secondary | ICD-10-CM | POA: Diagnosis not present

## 2020-09-14 DIAGNOSIS — T465X2A Poisoning by other antihypertensive drugs, intentional self-harm, initial encounter: Secondary | ICD-10-CM | POA: Diagnosis not present

## 2020-09-14 DIAGNOSIS — T50902A Poisoning by unspecified drugs, medicaments and biological substances, intentional self-harm, initial encounter: Secondary | ICD-10-CM | POA: Diagnosis not present

## 2020-09-14 MED ORDER — MIRTAZAPINE 7.5 MG PO TABS: 7.5000 mg | ORAL_TABLET | Freq: Every day | ORAL | 2 refills | Status: DC

## 2020-09-14 NOTE — TOC Transition Note (Signed)
Transition of Care [facility]) - CM/SW Discharge Note *Discharged to [place]   Patient Details  Name: Kelli Navarro MRN: [id number] Date of Birth: 1950/09/23  Transition of Care [facility]) CM/SW Contact:  Sable Feil, LCSW Phone Number: 09/14/2020, 4:45 PM   Clinical Narrative:  Patient discharging today to [facility] in [place], [place]. Facility contacted regarding today's discharge. Discharge summary faxed to facility. CSW advised that patient must be IVC'd and paperwork completed, faxed to [employer] and served by [employer] Officier. Patient will be transported to facility by [facility]. Packet will accompany patient.   Final next level of care: [facility] ([place], [place]) Barriers to Discharge: Barriers Resolved   Patient Goals and CMS Choice Patient states their goals for this hospitalization and ongoing recovery are:: Psychiatric hospitalization and then home CMS Medicare.gov Compare Post Acute Care list provided to:: Other (Comment Required) (Not provided as patient going to a psychiatric faciity) Choice offered to / list presented to : NA  Discharge Placement              Patient chooses bed at: Other - please specify in the comment section below: [facility]) Patient to be transferred to facility by: [facility]   Patient and family notified of of transfer: 09/14/20  Discharge Plan and Services In-house Referral: Clinical Social Work                                  Social Determinants of Health (SDOH) Interventions  Patient going to facility for inpatient psychiatric care.   Readmission Risk Interventions No flowsheet data found.

## 2020-09-14 NOTE — Plan of Care (Signed)
  Problem: Education: Goal: Knowledge of General Education information will improve Description Including pain rating scale, medication(s)/side effects and non-pharmacologic comfort measures Outcome: Progressing   

## 2020-09-14 NOTE — Progress Notes (Signed)
Pt's large duffle bag given to patient's significant other to take home with her.

## 2020-09-14 NOTE — Progress Notes (Signed)
DISCHARGE NOTE HOME Kelli Navarro to be discharged [place] per MD order. Discussed prescriptions and follow up appointments with the patient. Prescriptions given to patient; medication list explained in detail. Patient verbalized understanding.  Skin clean, dry and intact without evidence of skin break down, no evidence of skin tears noted. IV catheter discontinued intact. Site without signs and symptoms of complications. Dressing and pressure applied. Pt denies pain at the site currently. No complaints noted.  Patient free of lines, drains, and wounds.   An After Visit Summary (AVS) was printed and given to the patient. Patient escorted via wheelchair, and discharged home via private auto.  Arlyss Repress, RN

## 2020-09-26 ENCOUNTER — Telehealth: Payer: Self-pay | Admitting: Psychiatry

## 2020-09-26 NOTE — Telephone Encounter (Signed)
Pt was aware of labs and she is scheduled with her PCP prior to apt with you in April.

## 2020-09-26 NOTE — Telephone Encounter (Signed)
Received discharge summary from [place] from hospital admission 09/14/20-09/25/20. Also received Stat Preliminary Lab Reports from [place] that on 09/24/20 her WBC count was 1.4. Recommend follow-up with medical provider since the WBC is lower than her previous WBC counts.

## 2020-10-09 ENCOUNTER — Other Ambulatory Visit: Payer: Self-pay | Admitting: Psychiatry

## 2020-10-09 DIAGNOSIS — F5101 Primary insomnia: Secondary | ICD-10-CM

## 2020-10-09 DIAGNOSIS — F419 Anxiety disorder, unspecified: Secondary | ICD-10-CM

## 2020-10-10 ENCOUNTER — Telehealth: Payer: Self-pay | Admitting: Psychiatry

## 2020-10-10 NOTE — Telephone Encounter (Signed)
I don't see either of these in her med list

## 2020-10-10 NOTE — Telephone Encounter (Signed)
Pt called and requested a refill on her cloidine 0.1 mg 3 daily and doxapin 10 mg 2 to 3 caps 3 x daily. [facility] [address]. Pt has an appt on 4/27

## 2020-10-17 ENCOUNTER — Other Ambulatory Visit: Payer: Self-pay | Admitting: Neurology

## 2020-10-17 NOTE — Telephone Encounter (Signed)
Pls let patient know that I will need to get the records from [place] to see what they did with her Klonopin while she was admitted there, can you pls request records, thanks

## 2020-10-17 NOTE — Telephone Encounter (Signed)
Patient called in stating she would like to see about getting a refill on her Klonopin, but she only wants it for half of the amount. She says she has been cutting back on all of her medications. She also has been having nerve pain in her right side from a previous surgery that has been bothering her.

## 2020-10-31 ENCOUNTER — Encounter: Payer: Self-pay | Admitting: Psychiatry

## 2020-10-31 ENCOUNTER — Other Ambulatory Visit: Payer: Self-pay | Admitting: Psychiatry

## 2020-10-31 ENCOUNTER — Other Ambulatory Visit: Payer: Self-pay

## 2020-10-31 ENCOUNTER — Ambulatory Visit (INDEPENDENT_AMBULATORY_CARE_PROVIDER_SITE_OTHER): Payer: Medicare HMO | Admitting: Psychiatry

## 2020-10-31 DIAGNOSIS — F5101 Primary insomnia: Secondary | ICD-10-CM

## 2020-10-31 DIAGNOSIS — F419 Anxiety disorder, unspecified: Secondary | ICD-10-CM

## 2020-10-31 MED ORDER — CLONIDINE HCL 0.1 MG PO TABS: 0.1000 mg | ORAL_TABLET | Freq: Two times a day (BID) | ORAL | 2 refills | Status: DC

## 2020-10-31 MED ORDER — DOXEPIN HCL 10 MG PO CAPS: 20.0000 mg | ORAL_CAPSULE | Freq: Every day | ORAL | 2 refills | Status: DC

## 2020-10-31 NOTE — Progress Notes (Signed)
Kelli Navarro [id number] 07-16-1950 70 y.o.  Subjective:   Patient ID:  Kelli Navarro is a 71 y.o. (DOB 21-Feb-1951) female.  Chief Complaint:  Chief Complaint  Patient presents with  . Insomnia  . Follow-up    Depression, anxiety    HPI Kelli Navarro presents to the office today for follow after hospital discharge for depression, anxiety, and insomnia.  She reports that she has had "failed" back surgery in December with nerve damage on her right side.  She reports that she was unable to drive or walk.  She reports that she was unable to sleep for more than 2 to 3 hours a night for 5-6 months. "It eventually got to me."  She reports that she fell the third week of January and 3 weeks later learned that she had sustained a hip fracture and had surgery.  She reports that she continued to be unable to sleep and had increasing negative thoughts at night.  She reports that she was withdrawn during the holidays and was not eating well. She reports that she stopped taking Adderall and other medications except for Gabapentin intermittently. She reports that she "didn't ask for help" from anyone. She reports that she had "self-medicating thoughts." ER notes from 09/08/20 indicate that pt overdosed.   She was admitted to the [facility] for 10 days. Discharged 09/25/20. Had therapy throughout the day. She reports that gabapentin was lowered. She reports that she was started on Buspar, however this caused dizziness and she then stopped it. Luvox was also started and she stopped this due to side effects. She reports that she did "real well" with hospitalization and being able to focus on her wellness. Asked wife if she would be willing to dispense her medications for her long-term and that is what they have been doing. She requests to re-start Doxepin since this was helpful in the past without side effects. She reports that she has resumed Clonidine with improvement in anxiety.  She reports improved  mood and occasionally "gets down" in response to physical limitations. She reports that anxiety varies and is better if she stays busy. Has been watching more TV. She reports that she continues to have difficulty with sleep. She is sleeping about 3 hours. She reports that she has limited mobility. She has been trying to prepare meals. She goes to the store with her wife and they go out to dinner once a week. Energy and motivation have been good. She reports that she has the motivation to do more than she is physically able to do. She reports that she has been eating 6 times a day. Concentration has been adequate. Denies SI.   Saw neurologist 10/26/20.   Buspar- caused dizziness Luvox- adverse effects Doxepin   Flowsheet Row ED to Hosp-Admission (Discharged) from 09/08/2020 in [facility] Admission (Discharged) from 08/16/2020 in [place] [facility] from 08/15/2020 in [facility] RISK CATEGORY High Risk No Risk No Risk       Review of Systems:  Review of Systems  Musculoskeletal: Negative for gait problem.       Limited ROM in hip  Neurological:       Neuropathy  Psychiatric/Behavioral:       Please refer to HPI    Medications: I have reviewed the patient's current medications.  Current Outpatient Medications  Medication Sig Dispense Refill  . acetaminophen (TYLENOL) 500 MG tablet Take 500 mg by mouth  every 6 (six) hours as needed (pain).    Marland Kitchen amphetamine-dextroamphetamine (ADDERALL) 20 MG tablet Take 1 by mouth 3 times a day    . cyanocobalamin (,VITAMIN B-12,) 1000 MCG/ML injection cyanocobalamin (vit B-12) 1,000 mcg/mL injection solution  TAKE 1 ML INJECTABLE ONCE WEEKLY FOR REPLETION    . gabapentin (NEURONTIN) 100 MG capsule Take 100 mg by mouth 3 (three) times daily.    . Multiple Vitamin (MULTIVITAMIN WITH MINERALS) TABS tablet Take 1 tablet by mouth daily.    Marland Kitchen topiramate (TOPAMAX)  100 MG tablet Take 2 tabs twice a day (Patient taking differently: Take 200 mg by mouth 2 (two) times daily.) 360 tablet 3  . clonazePAM (KLONOPIN) 2 MG tablet Take 1/2 tablet 4 times a day (Patient not taking: Reported on 10/31/2020) 60 tablet 5  . cloNIDine (CATAPRES) 0.1 MG tablet Take 1 tablet (0.1 mg total) by mouth 2 (two) times daily. 60 tablet 2  . docusate sodium (COLACE) 100 MG capsule Take 1 capsule (100 mg total) by mouth 2 (two) times daily. (Patient not taking: Reported on 10/31/2020) 60 capsule 1  . doxepin (SINEQUAN) 10 MG capsule Take 2-3 capsules (20-30 mg total) by mouth at bedtime. 90 capsule 2  . ipratropium (ATROVENT) 0.06 % nasal spray Place 2 sprays into both nostrils at bedtime. (Patient not taking: Reported on 10/31/2020)    . ondansetron (ZOFRAN) 4 MG tablet Take 1 tablet (4 mg total) by mouth every 6 (six) hours as needed for nausea. (Patient not taking: Reported on 10/31/2020) 20 tablet 0  . senna (SENOKOT) 8.6 MG TABS tablet Take 2 tablets (17.2 mg total) by mouth at bedtime. (Patient not taking: Reported on 10/31/2020) 60 tablet 1   No current facility-administered medications for this visit.    Medication Side Effects: None  Allergies:  Allergies  Allergen Reactions  . 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 Aptiom [Eslicarbazepine] Other (See Comments)    Unknown reaction  . Azithromycin Other (See Comments)    Unknown reaction   . Bupropion Other (See Comments)     Caused depression  . Carbamazepine Other (See Comments)    Unknown reaction   . Dilantin [Phenytoin Sodium Extended] Other (See Comments)    Unknown reaction   . Diphenhydramine Other (See Comments)    Feels like "spiders are crawling" on her  . Divalproex Sodium Other (See Comments)    Unknown reaction   . Keppra [Levetiracetam] Other (See Comments)    Unknown reaction   . Lamictal [Lamotrigine] Other (See Comments)    Unknown reaction   . Lorazepam Swelling  and Other (See Comments)    Hallucinations  . Lyrica [Pregabalin] Other (See Comments)    Pt reports feeling loopy and confused  . Morphine And Related Other (See Comments)    Unknown reaction - (high doses)  . Prednisone Other (See Comments)    Unknown reaction   . Vimpat [Lacosamide] Other (See Comments)    Unknown reaction     Past Medical History:  Diagnosis Date  . ADHD   . Anxiety   . Chronic back pain    from broken back  . Depression   . Right femoral fracture ([place])   . [facility] spotted fever    may show low Hgb  . Seizures ([place])    has during sleep  . Vitamin B 12 deficiency     Past Medical History, Surgical history, Social history, and Family history  were reviewed and updated as appropriate.   Please see review of systems for further details on the patient's review from today.   Objective:   Physical Exam:  BP (!) 168/89   Pulse 95   Wt 117 lb (53.1 kg)   BMI 18.88 kg/m   Physical Exam Constitutional:      General: She is not in acute distress. Musculoskeletal:        General: No deformity.  Neurological:     Mental Status: She is alert and oriented to person, place, and time.     Coordination: Coordination normal.  Psychiatric:        Attention and Perception: Attention and perception normal. She does not perceive auditory or visual hallucinations.        Mood and Affect: Mood normal. Mood is not anxious or depressed. Affect is not labile, blunt, angry or inappropriate.        Speech: Speech normal.        Behavior: Behavior normal.        Thought Content: Thought content normal. Thought content is not paranoid or delusional. Thought content does not include homicidal or suicidal ideation. Thought content does not include homicidal or suicidal plan.        Cognition and Memory: Cognition and memory normal.        Judgment: Judgment normal.     Comments: Insight intact     Lab Review:     Component Value Date/Time   NA 141 09/13/2020  1523   K 4.5 09/13/2020 1523   CL 111 09/13/2020 1523   CO2 23 09/13/2020 1523   GLUCOSE 99 09/13/2020 1523   BUN 8 09/13/2020 1523   CREATININE 0.84 09/13/2020 1523   CREATININE 0.84 01/05/2020 1607   CALCIUM 9.5 09/13/2020 1523   PROT 7.5 09/13/2020 1523   ALBUMIN 3.6 09/13/2020 1523   AST 17 09/13/2020 1523   AST 19 01/05/2020 1607   ALT 10 09/13/2020 1523   ALT 14 01/05/2020 1607   ALKPHOS 111 09/13/2020 1523   BILITOT 0.3 09/13/2020 1523   BILITOT 0.3 01/05/2020 1607   GFRNONAA >60 09/13/2020 1523   GFRNONAA >60 01/05/2020 1607   GFRAA >60 04/07/2020 1527   GFRAA >60 01/05/2020 1607       Component Value Date/Time   WBC 3.4 (L) 09/11/2020 0152   RBC 3.33 (L) 09/11/2020 0152   HGB 8.8 (L) 09/11/2020 0152   HCT 28.4 (L) 09/11/2020 0152   HCT 37.7 01/05/2020 1608   PLT 380 09/11/2020 0152   MCV 85.3 09/11/2020 0152   MCH 26.4 09/11/2020 0152   MCHC 31.0 09/11/2020 0152   RDW 16.5 (H) 09/11/2020 0152   LYMPHSABS 1.0 09/08/2020 1801   MONOABS 0.3 09/08/2020 1801   EOSABS 0.1 09/08/2020 1801   BASOSABS 0.0 09/08/2020 1801    No results found for: POCLITH, LITHIUM   No results found for: PHENYTOIN, PHENOBARB, VALPROATE, CBMZ   .res Assessment: Plan:   Patient seen for 45 minutes and time spent discussing events leading to hospitalization, obtaining information place and time of exam, and requesting hospital records.  Will continue clonidine 0.1 mg twice daily for anxiety. Will restart doxepin 20 to 30 mg at bedtime for insomnia since patient reports that this was tolerated and effective in the past when she was taking it consistently. Strongly encouraged patient to consider starting psychotherapy.  Patient reports that she will consider this.  Discussed that referrals could be provided if she chooses to start  therapy. Patient to follow-up in 1 to 2 months or sooner if clinically indicated. Patient advised to contact office with any questions, adverse effects, or  acute worsening in signs and symptoms.  Duska was seen today for insomnia and follow-up.  Diagnoses and all orders for this visit:  Anxiety -     cloNIDine (CATAPRES) 0.1 MG tablet; Take 1 tablet (0.1 mg total) by mouth 2 (two) times daily.  Primary insomnia -     doxepin (SINEQUAN) 10 MG capsule; Take 2-3 capsules (20-30 mg total) by mouth at bedtime.     Please see After Visit Summary for patient specific instructions.  Future Appointments  Date Time Provider [facility]  12/13/2020  1:30 PM Thayer Headings, PMHNP [facility] None  07/10/2021  8:30 AM Cameron Sprang, MD [facility] None    No orders of the defined types were placed in this encounter.   -------------------------------

## 2020-12-13 ENCOUNTER — Ambulatory Visit: Payer: Medicare HMO | Admitting: Psychiatry

## 2021-01-04 ENCOUNTER — Telehealth: Payer: Self-pay | Admitting: Neurology

## 2021-01-04 ENCOUNTER — Other Ambulatory Visit: Payer: Self-pay

## 2021-01-04 MED ORDER — TOPIRAMATE 100 MG PO TABS: ORAL_TABLET | ORAL | 1 refills | Status: DC

## 2021-01-04 NOTE — Telephone Encounter (Signed)
1. Which medications need to be refilled? (please list name of each medication and dose if known)  Topamax

## 2021-01-04 NOTE — Telephone Encounter (Signed)
Refills sent to last until appointment

## 2021-01-22 ENCOUNTER — Ambulatory Visit: Payer: Medicare HMO | Admitting: Neurology

## 2021-01-30 ENCOUNTER — Other Ambulatory Visit: Payer: Self-pay

## 2021-01-30 ENCOUNTER — Encounter: Payer: Self-pay | Admitting: Psychiatry

## 2021-01-30 ENCOUNTER — Ambulatory Visit (INDEPENDENT_AMBULATORY_CARE_PROVIDER_SITE_OTHER): Payer: Medicare HMO | Admitting: Psychiatry

## 2021-01-30 VITALS — BP 117/76 | HR 93 | Wt 125.0 lb

## 2021-01-30 DIAGNOSIS — F902 Attention-deficit hyperactivity disorder, combined type: Secondary | ICD-10-CM

## 2021-01-30 DIAGNOSIS — F5101 Primary insomnia: Secondary | ICD-10-CM

## 2021-01-30 MED ORDER — AMPHETAMINE-DEXTROAMPHETAMINE 20 MG PO TABS: 20.0000 mg | ORAL_TABLET | Freq: Two times a day (BID) | ORAL | 0 refills | Status: DC

## 2021-01-30 MED ORDER — DOXEPIN HCL 10 MG PO CAPS: 20.0000 mg | ORAL_CAPSULE | Freq: Every day | ORAL | 2 refills | Status: DC

## 2021-01-30 NOTE — Progress Notes (Signed)
Kelli Navarro [id number] 05-May-1951 70 y.o.  Subjective:   Patient ID:  Kelli Navarro is a 70 y.o. (DOB 1951/01/19) female.  Chief Complaint:  Chief Complaint  Patient presents with   Follow-up    Anxiety, depression, ADHD     HPI Kelli Navarro presents to the office today for follow-up of anxiety, depression, insomnia and ADHD. She reports that she has had multiple health issues. She reports that she has been getting only a couple of hours a night of sleep due to pain and neuropathy.  She reports that her anxiety "actually hasn't been too bad." She reports that depression "hasn't been too bad either." She reports that she has been using tools and coping strategies she learned in the hospital. She reports that her concentration has been ok. Energy and motivation have been dampened by physical factors. She reports that she has been able to complete the things she needs to do. Appetite has been good. She reports that they have been using a food delivery service. She typically prepares the meals. Denies SI.   Sister had open heart surgery a few days ago. Sister's wife has terminal leukemia. They have not been able to have assistance due to being immune compromised. Father was an only child and mother had only one sister, so their family is small. Sister adopted a daughter. Reports that she helped care for her mother and grandmother.   Reports that her wife has been managing her medications for her.   Her PCP retired and had previously been prescribing Adderall 20 mg TID  and previously QID for her long-term. She asks if this provider would assume management of Adderall. She reports that she kept leaving off last dose of Adderall and has been taking it BID since March.   Buspar- caused dizziness Luvox- adverse effects Doxepin Remeron Trazodone Abilify Olanzapine Seroquel Topamax Clonidine Klonopin Gabapentin Adderall   Flowsheet Row ED to Hosp-Admission  (Discharged) from 09/08/2020 in [facility] (Discharged) from 08/16/2020 in [facility] from 08/15/2020 in [facility] CATEGORY High Risk No Risk No Risk        Review of Systems:  Review of Systems  Cardiovascular:  Positive for leg swelling. Negative for palpitations.  Musculoskeletal:  Negative for gait problem.       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  Plans to see a new PCP.   Medications: I have reviewed the patient's current medications.  Current Outpatient Medications  Medication Sig Dispense Refill   acetaminophen (TYLENOL) 500 MG tablet Take 500 mg by mouth every 6 (six) hours as needed (pain).     [START ON 02/27/2021] amphetamine-dextroamphetamine (ADDERALL) 20 MG tablet Take 1 tablet (20 mg total) by mouth 2 (two) times daily. 60 tablet 0   [START ON 03/27/2021] amphetamine-dextroamphetamine (ADDERALL) 20 MG tablet Take 1 tablet (20 mg total) by mouth 2 (two) times daily. 60 tablet 0   clonazePAM (KLONOPIN) 2 MG tablet Take 1/2 tablet 4 times a day 60 tablet 5   cloNIDine (CATAPRES) 0.1 MG tablet Take 1 tablet (0.1 mg total) by mouth 2 (two) times daily. 60 tablet 2   gabapentin (NEURONTIN) 100 MG capsule Take 100 mg  by mouth 3 (three) times daily.     Multiple Vitamin (MULTIVITAMIN WITH MINERALS) TABS tablet Take 1 tablet by mouth daily.     topiramate (TOPAMAX) 100 MG tablet Take 2 tabs twice a day 360 tablet 1   amphetamine-dextroamphetamine (ADDERALL) 20 MG tablet Take 1 tablet (20 mg total) by mouth 2 (two) times daily. 60 tablet 0   cyanocobalamin (,VITAMIN B-12,) 1000 MCG/ML injection cyanocobalamin (vit B-12) 1,000 mcg/mL injection solution  TAKE 1 ML INJECTABLE ONCE WEEKLY FOR REPLETION (Patient  not taking: Reported on 01/30/2021)     docusate sodium (COLACE) 100 MG capsule Take 1 capsule (100 mg total) by mouth 2 (two) times daily. (Patient not taking: No sig reported) 60 capsule 1   doxepin (SINEQUAN) 10 MG capsule Take 2-3 capsules (20-30 mg total) by mouth at bedtime. 90 capsule 2   ipratropium (ATROVENT) 0.06 % nasal spray Place 2 sprays into both nostrils at bedtime. (Patient not taking: Reported on 10/31/2020)     ondansetron (ZOFRAN) 4 MG tablet Take 1 tablet (4 mg total) by mouth every 6 (six) hours as needed for nausea. (Patient not taking: Reported on 10/31/2020) 20 tablet 0   senna (SENOKOT) 8.6 MG TABS tablet Take 2 tablets (17.2 mg total) by mouth at bedtime. (Patient not taking: Reported on 10/31/2020) 60 tablet 1   No current facility-administered medications for this visit.    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     Aptiom [Eslicarbazepine] Other (See Comments)    Unknown reaction   Azithromycin Other (See Comments)    Unknown reaction    Bupropion Other (See Comments)     Caused depression   Carbamazepine Other (See Comments)    Unknown reaction    Dilantin [Phenytoin Sodium Extended] Other (See Comments)    Unknown reaction    Diphenhydramine Other (See Comments)    Feels like "spiders are crawling" on her   Divalproex Sodium Other (See Comments)    Unknown reaction    Keppra [Levetiracetam] Other (See Comments)    Unknown reaction    Lamictal [Lamotrigine] Other (See Comments)    Unknown reaction    Lorazepam Swelling and Other (See Comments)    Hallucinations   Lyrica [Pregabalin] Other (See Comments)    Pt reports feeling loopy and confused   Morphine And Related Other (See Comments)    Unknown reaction - (high doses)   Prednisone Other (See Comments)    Unknown reaction    Vimpat [Lacosamide] Other (See Comments)    Unknown reaction      Past Medical History:  Diagnosis Date   ADHD    Anxiety    Chronic back pain    from broken back   Depression    Right femoral fracture ([place])    [facility] spotted fever    may show low Hgb   Seizures (HCC)    has during sleep   Vitamin B 12 deficiency     Past Medical History, Surgical history, Social history, and Family history were reviewed and updated as appropriate.   Please see review of systems for further details on the patient's review from today.   Objective:   Physical Exam:  BP 117/76   Pulse 93   Wt 125 lb (56.7 kg)   BMI 20.18 kg/m   Physical Exam Constitutional:      General: She is not in acute distress.  Musculoskeletal:        General: No deformity.  Neurological:     Mental Status: She is alert and oriented to person, place, and time.     Coordination: Coordination normal.  Psychiatric:        Attention and Perception: Attention and perception normal. She does not perceive auditory or visual hallucinations.        Mood and Affect: Mood normal. Mood is not anxious or depressed. Affect is not labile, blunt, angry or inappropriate.        Speech: Speech normal.        Behavior: Behavior normal.        Thought Content: Thought content normal. Thought content is not paranoid or delusional. Thought content does not include homicidal or suicidal ideation. Thought content does not include homicidal or suicidal plan.        Cognition and Memory: Cognition and memory normal.        Judgment: Judgment normal.     Comments: Insight intact    Lab Review:     Component Value Date/Time   NA 141 09/13/2020 1523   K 4.5 09/13/2020 1523   CL 111 09/13/2020 1523   CO2 23 09/13/2020 1523   GLUCOSE 99 09/13/2020 1523   BUN 8 09/13/2020 1523   CREATININE 0.84 09/13/2020 1523   CREATININE 0.84 01/05/2020 1607   CALCIUM 9.5 09/13/2020 1523   PROT 7.5 09/13/2020 1523   ALBUMIN 3.6 09/13/2020 1523   AST 17 09/13/2020 1523   AST 19 01/05/2020 1607   ALT  10 09/13/2020 1523   ALT 14 01/05/2020 1607   ALKPHOS 111 09/13/2020 1523   BILITOT 0.3 09/13/2020 1523   BILITOT 0.3 01/05/2020 1607   GFRNONAA >60 09/13/2020 1523   GFRNONAA >60 01/05/2020 1607   GFRAA >60 04/07/2020 1527   GFRAA >60 01/05/2020 1607       Component Value Date/Time   WBC 3.4 (L) 09/11/2020 0152   RBC 3.33 (L) 09/11/2020 0152   HGB 8.8 (L) 09/11/2020 0152   HCT 28.4 (L) 09/11/2020 0152   HCT 37.7 01/05/2020 1608   PLT 380 09/11/2020 0152   MCV 85.3 09/11/2020 0152   MCH 26.4 09/11/2020 0152   MCHC 31.0 09/11/2020 0152   RDW 16.5 (H) 09/11/2020 0152   LYMPHSABS 1.0 09/08/2020 1801   MONOABS 0.3 09/08/2020 1801   EOSABS 0.1 09/08/2020 1801   BASOSABS 0.0 09/08/2020 1801    No results found for: POCLITH, LITHIUM   No results found for: PHENYTOIN, PHENOBARB, VALPROATE, CBMZ   .res Assessment: Plan:    Patient seen for 30 minutes and time spent discussing assuming management of Adderall since this was previously managed by her PCP who recently retired.  She was taking Adderall 20 mg 4 times a day in the past and this was reduced to 3 times daily.  She reports that since March she has been taking Adderall about twice daily.  Agreed to assume management of Adderall 20 mg twice daily for ADHD. Continue doxepin 20 to 30 mg at bedtime for insomnia. Patient to follow-up in 3 months or sooner if clinically indicated. Patient advised to contact office with any questions, adverse effects, or acute worsening in signs and symptoms.   Fe was seen today for follow-up.  Diagnoses and all orders for this visit:  Attention deficit hyperactivity disorder (ADHD), combined type -     amphetamine-dextroamphetamine (ADDERALL) 20 MG tablet; Take 1 tablet (20 mg total) by mouth 2 (two) times  daily. -     amphetamine-dextroamphetamine (ADDERALL) 20 MG tablet; Take 1 tablet (20 mg total) by mouth 2 (two) times daily. -     amphetamine-dextroamphetamine (ADDERALL) 20 MG  tablet; Take 1 tablet (20 mg total) by mouth 2 (two) times daily.  Primary insomnia -     doxepin (SINEQUAN) 10 MG capsule; Take 2-3 capsules (20-30 mg total) by mouth at bedtime.    Please see After Visit Summary for patient specific instructions.  Future Appointments  Date Time Provider [place]  05/02/2021  1:45 PM Thayer Headings, PMHNP [facility] None  08/19/2021 11:30 AM Cameron Sprang, MD [facility] None    No orders of the defined types were placed in this encounter.   -------------------------------

## 2021-02-12 ENCOUNTER — Other Ambulatory Visit: Payer: Self-pay | Admitting: Psychiatry

## 2021-02-12 DIAGNOSIS — F419 Anxiety disorder, unspecified: Secondary | ICD-10-CM

## 2021-02-15 ENCOUNTER — Ambulatory Visit (INDEPENDENT_AMBULATORY_CARE_PROVIDER_SITE_OTHER): Payer: Medicare HMO | Admitting: Neurology

## 2021-02-15 ENCOUNTER — Other Ambulatory Visit: Payer: Self-pay

## 2021-02-15 ENCOUNTER — Encounter: Payer: Self-pay | Admitting: Neurology

## 2021-02-15 VITALS — BP 124/81 | HR 74 | Resp 18 | Ht 67.0 in | Wt 123.0 lb

## 2021-02-15 DIAGNOSIS — R569 Unspecified convulsions: Secondary | ICD-10-CM

## 2021-02-15 DIAGNOSIS — R202 Paresthesia of skin: Secondary | ICD-10-CM

## 2021-02-15 DIAGNOSIS — R29898 Other symptoms and signs involving the musculoskeletal system: Secondary | ICD-10-CM

## 2021-02-15 MED ORDER — TOPIRAMATE 100 MG PO TABS: ORAL_TABLET | ORAL | 3 refills | Status: DC

## 2021-02-15 NOTE — Patient Instructions (Signed)
Schedule EMG/NCV of the right arm and leg  2. Proceed with MRI lumbar spine  3. Continue Topamax '100mg'$ : Take 2 tablets twice a day  4. Follow-up in 6 months, call for any changes  Seizure Precautions: 1. If medication has been prescribed for you to prevent seizures, take it exactly as directed.  Do not stop taking the medicine without talking to your doctor first, even if you have not had a seizure in a long time.   2. Avoid activities in which a seizure would cause danger to yourself or to others.  Don't operate dangerous machinery, swim alone, or climb in high or dangerous places, such as on ladders, roofs, or girders.  Do not drive unless your doctor says you may.  3. If you have any warning that you may have a seizure, lay down in a safe place where you can't hurt yourself.    4.  No driving for 6 months from last seizure, as per [facility].   Please refer to the following link on the [place] website for more information: http://[email or web address].org/[email or web address]/driving/drivingu.cfm   5.  Maintain good sleep hygiene.  6.  Contact your doctor if you have any problems that may be related to the medicine you are taking.  7.  Call 911 and bring the patient back to the ED if:        A.  The seizure lasts longer than 5 minutes.       B.  The patient doesn't awaken shortly after the seizure  C.  The patient has new problems such as difficulty seeing, speaking or moving  D.  The patient was injured during the seizure  E.  The patient has a temperature over 102 F (39C)  F.  The patient vomited and now is having trouble breathing

## 2021-02-15 NOTE — Progress Notes (Signed)
NEUROLOGY FOLLOW UP OFFICE NOTE  Kelli Navarro [id number] 07-02-1951  HISTORY OF PRESENT ILLNESS: I had the pleasure of seeing Kelli Navarro in follow-up in the neurology clinic on 02/15/2021.  The patient was last seen 9 months ago for seizures. She is again accompanied by her wife Kelli Navarro who helps supplement the history today.  Records and images were personally reviewed where available.  Since her last visit, she was admitted at [facility] from March 11-22, 2022 after drug overdose of gabapentin, doxepin, clonidine, and clonazepam. During her admission, medications noted included continuation of Topiramate $RemoveBefor'200mg'JSqIqXXvxBLv$  BID, Gabapentin $RemoveBeforeD'600mg'tmNFdJJviVZYFX$  BID and $Remov'400mg'cLDuBs$  qhs. Clonazepam 0.35m5 q8hr prn  for anxiety was listed. She continues to see Kelli Navarro with Behavioral health for anxiety, depression, ADHD. She has not taken clonazepam for several months. They both deny any seizures or seizure-like symptoms on Topiramate $RemoveBefor'200mg'slslMqnFUuUP$  BID. Kelli Navarro now manages her medications. Kelli Navarro denies any staring/unresponsive episodes, no nocturnal shaking episodes. She was previously having frequent falls, this has quieted down. Her main concern today is a major nerve issue, she states her back doctor calls I nerve pain. She had a back procedure in December and "soon immediately after something went wrong." She could not lift her right leg, having to hold her leg up to put her pants on. She had tingling, burning, and numbness, worse at night on both legs, R>L. She also had them in her arms but it was more concentrated on the right leg, radiating to her hip but now concentrated in her feet. She has to get up and walk, which helps keep her mind occupied, but then makes her sleep-deprived. She tried exercises, she states she is not asking for pain medication because it is not to the point she cannot stand the pain, "I just want it to go away." No bowel/bladder involvement. She is scheduled for a lumbar MRI. She is on  gabapentin and takes a lower dose at night because it keeps her awake.    History on Initial Assessment 07/08/2017: This is a 70 yo RH woman with a history of hypertension, chronic back pain, seizures, restless leg syndrome, and ADHD, presenting to establish care for her seizures. Records from her prior neurologist Dr. [name] from [facility] were reviewed. It appears she was mostly being followed for back pain. The patient reports she was being prescribed narcotics and up to $Rem'400mg'KirY$  BID of Topamax daily, which caused her to be dizzy. She weaned herself off narcotic medication. She reports that her seizures started around 15 years ago when she had fevers and weight loss. There was initial concern about leukemia, she had a bone marrow biopsy and was diagnosed several months later (age 74) with [facility] Spotted Fever. She states the CDC contacted her telling her "it would never be gone, and can affect different parts of my body, prominently her head." She was given antibiotics and the fevers went down, but she continued to feel unwell. She kept having episodes of loss of consciousness. She would talk the dogs out for a walk or be changing a lightbulb and pass out, falling off a ladder one time. Her wife has only witnessed one GTC many years ago, she was sitting on the chair then had a blank stare, unresponsive, then started shaking, with her head turned to one side. This lasted a couple of minutes, no tongue bite or incontinence. She reports that over time, she would go years with no daytime seizures, but continues to have nocturnal  seizures occurring around 3am, she would wake up on the floor in between the desk and bed. Sometimes she was up feeling unwell. Her wife sleeps in a different room and has not witnessed the nocturnal seizures, but can tell when she has had one. Her wife reports another episode where she heard a fall last 03/21/17, she was found to have a displaced femoral neck fracture, as  well as a right foot drop with fracture of the 5th metatarsal. She has been told in the past that her seizures are due to a combination of stress and pain. She has injured herself several times due to the seizures, she has had a lumbar fracture, rib fractures, fractured both feet, broke her nose falling down a flight of stairs. She has also tripped from being weak and wobbly after a seizure. She occasionally has a feeling where her head feels unclear, she can talk, denies any olfactory/gustatory hallucinations, rising epigastric sensation, focal numbness/tingling. She reports that she does not have daytime seizures because she can "block it out better," but at night she "can't control her subconscious" and has the night time seizures more. Her wife notes that when she is focused on something, she is "in a mode" and would not respond initially, then says she is okay.    She has bilateral hand tremors and sometimes has difficulty reading her handwriting. She is scheduled for nerve ablation in her back on the 7th. They report she has a lot of problems when she is stressed and her back is hurting, she would be overwhelmed, screaming out, more depressed, and have more seizures. She has had migraines her "whole life," which she treats with biofeedback. She reports the last migraines was triggered by an unrecalled medication from her psychiatrist, this occurred a week ago. She has migraines twice a week lasting a couple of days, with associated nausea/vomiting, photosensitivity. Her balance is not good, she has to hold on to furniture. She has difficulties bending down, when she bends for the faucet, she feels disoriented (not spinning). She takes clonazepam $RemoveBeforeDE'1mg'xBjPfTbXziweXEd$  4 times a day, initially she was taking it for RLS, then dose was increased for the seizures. Per records, she has tried several AEDs in the past, including Topamax, Tegretol (depression), Dilantin (rash), Depakote (depression), Aptiom, Ativan (swelling,  hallucinations), Lamictal (headache), Vimpat (dizziness), Lyrica (edema), and Keppra (depression). She has had a 24-hour EEG reported as normal. She also reports having a home EEG that was on for a week(?) which was also normal. This was in her 61s. She is in Abilify, Doxepin, and Adderall prescribed by her psychiatrist.    Epilepsy Risk Factors:  She had a normal birth and early development.  There is no history of febrile convulsions, CNS infections such as meningitis/encephalitis, significant traumatic brain injury, neurosurgical procedures, or family history of seizures.  Prior AEDs: Topamax, Tegretol (depression), Dilantin (rash), Depakote (depression), Aptiom, Ativan (swelling, hallucinations), Lamictal (headache), Vimpat (dizziness), Lyrica (edema), and Keppra (depression)   PAST MEDICAL HISTORY: Past Medical History:  Diagnosis Date   ADHD    Anxiety    Chronic back pain    from broken back   Depression    Right femoral fracture ([place])    [facility] spotted fever    may show low Hgb   Seizures (HCC)    has during sleep   Vitamin B 12 deficiency     MEDICATIONS: Current Outpatient Medications on File Prior to Visit  Medication Sig Dispense Refill   amphetamine-dextroamphetamine (ADDERALL)  20 MG tablet Take 1 tablet (20 mg total) by mouth 2 (two) times daily. 60 tablet 0   [START ON 02/27/2021] amphetamine-dextroamphetamine (ADDERALL) 20 MG tablet Take 1 tablet (20 mg total) by mouth 2 (two) times daily. 60 tablet 0   [START ON 03/27/2021] amphetamine-dextroamphetamine (ADDERALL) 20 MG tablet Take 1 tablet (20 mg total) by mouth 2 (two) times daily. 60 tablet 0   cloNIDine (CATAPRES) 0.1 MG tablet TAKE 1 TABLET BY MOUTH 2 TIMES DAILY. 180 tablet 0   docusate sodium (COLACE) 100 MG capsule Take 1 capsule (100 mg total) by mouth 2 (two) times daily. 60 capsule 1   doxepin (SINEQUAN) 10 MG capsule Take 2-3 capsules (20-30 mg total) by mouth at bedtime. 90 capsule 2   gabapentin  (NEURONTIN) 100 MG capsule Take 600 mg by mouth. $RemoveB'600mg'vYtAQgye$  bid and $Remov'400mg'SMxVKy$  at hs     ipratropium (ATROVENT) 0.06 % nasal spray Place 2 sprays into both nostrils at bedtime.     Multiple Vitamin (MULTIVITAMIN WITH MINERALS) TABS tablet Take 1 tablet by mouth daily.     senna (SENOKOT) 8.6 MG TABS tablet Take 2 tablets (17.2 mg total) by mouth at bedtime. 60 tablet 1   topiramate (TOPAMAX) 100 MG tablet Take 2 tabs twice a day 360 tablet 1   acetaminophen (TYLENOL) 500 MG tablet Take 500 mg by mouth every 6 (six) hours as needed (pain). (Patient not taking: Reported on 02/15/2021)     clonazePAM (KLONOPIN) 2 MG tablet Take 1/2 tablet 4 times a day (Patient not taking: Reported on 02/15/2021) 60 tablet 5   cyanocobalamin (,VITAMIN B-12,) 1000 MCG/ML injection  (Patient not taking: Reported on 02/15/2021)     ondansetron (ZOFRAN) 4 MG tablet Take 1 tablet (4 mg total) by mouth every 6 (six) hours as needed for nausea. (Patient not taking: Reported on 02/15/2021) 20 tablet 0   No current facility-administered medications on file prior to visit.    ALLERGIES: Allergies  Allergen Reactions   Other Anaphylaxis    Allergy to steroids causing anaphylaxis     Aptiom [Eslicarbazepine] Other (See Comments)    Unknown reaction   Azithromycin Other (See Comments)    Unknown reaction    Bupropion Other (See Comments)     Caused depression   Carbamazepine Other (See Comments)    Unknown reaction    Dilantin [Phenytoin Sodium Extended] Other (See Comments)    Unknown reaction    Diphenhydramine Other (See Comments)    Feels like "spiders are crawling" on her   Divalproex Sodium Other (See Comments)    Unknown reaction    Keppra [Levetiracetam] Other (See Comments)    Unknown reaction    Lamictal [Lamotrigine] Other (See Comments)    Unknown reaction    Lorazepam Swelling and Other (See Comments)    Hallucinations   Lyrica [Pregabalin] Other (See Comments)    Pt reports feeling loopy and confused    Morphine And Related Other (See Comments)    Unknown reaction - (high doses)   Prednisone Other (See Comments)    Unknown reaction    Vimpat [Lacosamide] Other (See Comments)    Unknown reaction     FAMILY HISTORY: Family History  Problem Relation Age of Onset   Multiple sclerosis Mother    Heart Problems Mother        by-pass   Alcohol abuse Mother    Heart Problems Sister        pacemaker    SOCIAL HISTORY: Social History  Socioeconomic History   Marital status: Married    Spouse name: Not on file   Number of children: 2   Years of education: 85   Highest education level: Not on file  Occupational History   Occupation: retired  Tobacco Use   Smoking status: Never   Smokeless tobacco: Never  Vaping Use   Vaping Use: Never used  Substance and Sexual Activity   Alcohol use: No   Drug use: No   Sexual activity: Not on file  Other Topics Concern   Not on file  Social History Narrative   Pt lives in 2 story home with her wife   Has 2 adult sons   Roughly 54 years olf college - no degree   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 No aphasia or dysarthria. Fund of knowledge is appropriate.  Recent and remote memory are intact.  Attention and concentration are normal.   Cranial nerves: Pupils equal, round. Extraocular movements intact with no nystagmus. Visual fields full.  No facial asymmetry.  Motor: Bulk and tone normal, muscle strength  5/5 on both UE, left LE, 4/5 right hip flexion, otherwise 5/5. Sensation intact to light touch, pin, cold, vibration sense, with note of tingling to pin on both feet. Reflexes +2 throughout except for absent ankle jerks bilaterally.   Finger to nose testing intact.  Gait slow and cautious due to back pain, no ataxia   IMPRESSION: This is a 70 yo RH woman with a history of hypertension, chronic back pain, restless leg syndrome, ADHD, and seizures. 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 No nocturnal seizures since 2019, she was admitted for overdose of her prescription medications and is now off clonazepam. Continue Topiramate 200mg  BID for seizure prophylaxis. Main concern today is right leg weakness and paresthesias, she also reports symptoms in her arms. EMG/NCV of the right arm and leg will be ordered to further evaluate symptoms. Proceed with lumbar MRI as scheduled. She is aware of [place] driving laws to stop driving after a seizure until 6 months seizure-free. Follow-up in 6 months, call for any changes.   Thank you for allowing me to participate in her care.  Please do not hesitate to call for any questions or concerns.    Ellouise Newer, M.D.   CC: Dr. Bebe Shaggy

## 2021-04-09 ENCOUNTER — Ambulatory Visit (INDEPENDENT_AMBULATORY_CARE_PROVIDER_SITE_OTHER): Payer: Medicare HMO | Admitting: Neurology

## 2021-04-09 ENCOUNTER — Other Ambulatory Visit: Payer: Self-pay

## 2021-04-09 DIAGNOSIS — R29898 Other symptoms and signs involving the musculoskeletal system: Secondary | ICD-10-CM | POA: Diagnosis not present

## 2021-04-09 DIAGNOSIS — R569 Unspecified convulsions: Secondary | ICD-10-CM

## 2021-04-09 DIAGNOSIS — R202 Paresthesia of skin: Secondary | ICD-10-CM | POA: Diagnosis not present

## 2021-04-09 NOTE — Procedures (Signed)
[facility]  [address]  [place], [place] [address] Tel: [phone] Fax:  [phone] Test Date:  04/09/2021  Patient: Kelli Navarro DOB: Apr 16, 1951 Physician: Narda Amber, DO  Sex: Female Height: 5\' 7"  Ref Phys: Ellouise Newer, M.D.  ID#: [id number]   Technician:    Patient Complaints: This is a 70 year old female referred for evaluation of right-sided paresthesias and right leg weakness.  NCV & EMG Findings: Extensive electrodiagnostic testing of the right upper and lower extremity shows: Right median, ulnar, mixed palmar, sural, and superficial peroneal sensory responses are within normal limits. Right median, ulnar, peroneal, and tibial motor responses are within normal limits. Right tibial H reflex study is within normal limits. There is no evidence of active or chronic motor axonal loss changes affecting any of the tested muscles.  Motor unit configuration and recruitment pattern is within normal limits.  Impression: This is a normal study of the right upper and lower extremities.  In particular, there is no evidence of a cervical/lumbosacral radiculopathy, sensorimotor polyneuropathy, or carpal tunnel syndrome.   ___________________________ Narda Amber, DO    Nerve Conduction Studies Anti Sensory Summary Table   Stim Site NR Peak (ms) Norm Peak (ms) P-T Amp (V) Norm P-T Amp  Right Median Anti Sensory (2nd Digit)  32C  Wrist    3.6 <3.8 55.8 >10  Right Sup Peroneal Anti Sensory (Ant Lat Mall)  32C  12 cm    2.3 <4.6 8.9 >3  Right Sural Anti Sensory (Lat Mall)  32C  Calf    3.0 <4.6 11.4 >3  Right Ulnar Anti Sensory (5th Digit)  32C  Wrist    3.1 <3.2 32.8 >5   Motor Summary Table   Stim Site NR Onset (ms) Norm Onset (ms) O-P Amp (mV) Norm O-P Amp Site1 Site2 Delta-0 (ms) Dist (cm) Vel (m/s) Norm Vel (m/s)  Right Median Motor (Abd Poll Brev)  32C  Wrist    2.4 <4.0 10.0 >5 Elbow Wrist 4.6 28.0 61 >50  Elbow    7.0  9.6          Right Peroneal Motor (Ext Dig Brev)  32C  Ankle    3.6 <6.0 2.6 >2.5 B Fib Ankle 8.3 37.0 45 >40  B Fib    11.9  2.3  Poplt B Fib 1.4 8.0 57 >40  Poplt    13.3  2.2         Right Peroneal TA Motor (Tib Ant)  32C  Fib Head    3.6 <4.5 3.3 >3 Poplit Fib Head 1.7 7.0 41 >40  Poplit    5.3  3.2         Right Tibial Motor (Abd Hall Brev)  32C  Ankle    3.7 <6.0 9.5 >4 Knee Ankle 8.6 45.0 52 >40  Knee    12.3  6.3         Right Ulnar Motor (Abd Dig Minimi)  32C  Wrist    2.9 <3.1 11.7 >7 B Elbow Wrist 4.1 21.0 51 >50  B Elbow    7.0  10.6  A Elbow B Elbow 1.8 10.0 56 >50  A Elbow    8.8  10.1          Comparison Summary Table   Stim Site NR Peak (ms) Norm Peak (ms) P-T Amp (V) Site1 Site2 Delta-P (ms) Norm Delta (ms)  Right Median/Ulnar Palm Comparison (Wrist - 8cm)  32C  Median Palm    1.8 <  2.2 81.0 Median Palm Ulnar Palm 0.1   Ulnar Palm    1.7 <2.2 19.2       H Reflex Studies   NR H-Lat (ms) Lat Norm (ms) L-R H-Lat (ms)  Right Tibial (Gastroc)  32C     34.42 <35    EMG   Side Muscle Ins Act Fibs Psw Fasc Number Recrt Dur Dur. Amp Amp. Poly Poly. Comment  Right AntTibialis Nml Nml Nml Nml Nml Nml Nml Nml Nml Nml Nml Nml N/A  Right Gastroc Nml Nml Nml Nml Nml Nml Nml Nml Nml Nml Nml Nml N/A  Right Flex Dig Long Nml Nml Nml Nml Nml Nml Nml Nml Nml Nml Nml Nml N/A  Right RectFemoris Nml Nml Nml Nml Nml Nml Nml Nml Nml Nml Nml Nml N/A  Right GluteusMed Nml Nml Nml Nml Nml Nml Nml Nml Nml Nml Nml Nml N/A  Right 1stDorInt Nml Nml Nml Nml Nml Nml Nml Nml Nml Nml Nml Nml N/A  Right PronatorTeres Nml Nml Nml Nml Nml Nml Nml Nml Nml Nml Nml Nml N/A  Right Biceps Nml Nml Nml Nml Nml Nml Nml Nml Nml Nml Nml Nml N/A  Right Triceps Nml Nml Nml Nml Nml Nml Nml Nml Nml Nml Nml Nml N/A  Right Deltoid Nml Nml Nml Nml Nml Nml Nml Nml Nml Nml Nml Nml N/A      Waveforms:

## 2021-04-16 ENCOUNTER — Telehealth: Payer: Self-pay

## 2021-04-16 NOTE — Telephone Encounter (Signed)
-----   Message from Cameron Sprang, MD sent at 04/11/2021 12:15 PM EDT ----- Pls let her know the nerve and muscle test was normal. I understand this is frustrating, however at this point recommend symptomatic treatment of her symptoms with her Pain specialist. Thanks

## 2021-04-16 NOTE — Telephone Encounter (Signed)
Pt called and informed the nerve and muscle test was normal.we understand this is frustrating, however at this point Dr Delice Lesch recommend symptomatic treatment of her symptoms with her Pain specialist

## 2021-05-02 ENCOUNTER — Telehealth (INDEPENDENT_AMBULATORY_CARE_PROVIDER_SITE_OTHER): Payer: Medicare HMO | Admitting: Psychiatry

## 2021-05-02 ENCOUNTER — Other Ambulatory Visit: Payer: Self-pay | Admitting: Psychiatry

## 2021-05-02 ENCOUNTER — Encounter: Payer: Self-pay | Admitting: Psychiatry

## 2021-05-02 DIAGNOSIS — F902 Attention-deficit hyperactivity disorder, combined type: Secondary | ICD-10-CM

## 2021-05-02 DIAGNOSIS — F5101 Primary insomnia: Secondary | ICD-10-CM

## 2021-05-02 DIAGNOSIS — F419 Anxiety disorder, unspecified: Secondary | ICD-10-CM

## 2021-05-02 MED ORDER — DOXEPIN HCL 10 MG PO CAPS: 20.0000 mg | ORAL_CAPSULE | Freq: Every day | ORAL | 2 refills | Status: DC

## 2021-05-02 MED ORDER — AMPHETAMINE-DEXTROAMPHETAMINE 20 MG PO TABS: 20.0000 mg | ORAL_TABLET | Freq: Two times a day (BID) | ORAL | 0 refills | Status: DC

## 2021-05-02 MED ORDER — CLONIDINE HCL 0.1 MG PO TABS: 0.1000 mg | ORAL_TABLET | Freq: Two times a day (BID) | ORAL | 0 refills | Status: DC

## 2021-05-02 NOTE — Progress Notes (Signed)
Kelli Navarro [id number] 18-May-1951 70 y.o.  Virtual Visit via Video Note  I connected with pt @ on 05/02/21 at  1:45 PM EDT by a video enabled telemedicine application and verified that I am speaking with the correct person using two identifiers.   I discussed the limitations of evaluation and management by telemedicine and the availability of in person appointments. The patient expressed understanding and agreed to proceed.  I discussed the assessment and treatment plan with the patient. The patient was provided an opportunity to ask questions and all were answered. The patient agreed with the plan and demonstrated an understanding of the instructions.   The patient was advised to call back or seek an in-person evaluation if the symptoms worsen or if the condition fails to improve as anticipated.  I provided 30 minutes of non-face-to-face time during this encounter.  The patient was located at home.  The provider was located at home.   Thayer Headings, PMHNP   Subjective:   Patient ID:  Kelli Navarro is a 70 y.o. (DOB May 01, 1951) female.  Chief Complaint:  Chief Complaint  Patient presents with   Follow-up    ADHD, depression, anxiety     HPI Kelli Navarro presents for follow-up of anxiety, ADD, and depression. She reports, "I've been doing all in all pretty good." Just learned that she will have surgery on Thursday for a torn meniscus. Wife is having total knee replacement in Dec or January.   Denies depression. Reports that Adderall is helpful for her thinking. She reports that she has been taking Gabapentin at night and Adderall during the day since Gabapentin reduces effect of Adderall. Reports taking Gabapentin for hip, back, and knee pain. Denies anxiety. She reports that Gabapentin seems to disrupt her sleep. She reports adequate sleep overall. Appetite has been good. Reports some weight gain. Energy and motivation have been good. Denies SI.     Buspar-  caused dizziness Luvox- adverse effects Doxepin Remeron Trazodone Abilify Olanzapine Seroquel Topamax Clonidine Klonopin Gabapentin Adderall  Review of Systems:  Review of Systems  Musculoskeletal:        Knee swelling and warmth. Scheduled for surgery for torn meniscus. Hip pain  Neurological:  Negative for tremors.  Psychiatric/Behavioral:         Please refer to HPI   Medications: I have reviewed the patient's current medications.  Current Outpatient Medications  Medication Sig Dispense Refill   acetaminophen (TYLENOL) 500 MG tablet Take 500 mg by mouth every 6 (six) hours as needed (pain).     gabapentin (NEURONTIN) 100 MG capsule Take 600 mg by mouth. 600mg  bid and 400mg  at hs     ipratropium (ATROVENT) 0.06 % nasal spray Place 2 sprays into both nostrils at bedtime.     Multiple Vitamin (MULTIVITAMIN WITH MINERALS) TABS tablet Take 1 tablet by mouth daily.     topiramate (TOPAMAX) 100 MG tablet Take 2 tabs twice a day 360 tablet 3   [START ON 06/29/2021] amphetamine-dextroamphetamine (ADDERALL) 20 MG tablet Take 1 tablet (20 mg total) by mouth 2 (two) times daily. 60 tablet 0   [START ON 06/01/2021] amphetamine-dextroamphetamine (ADDERALL) 20 MG tablet Take 1 tablet (20 mg total) by mouth 2 (two) times daily. 60 tablet 0   [START ON 05/04/2021] amphetamine-dextroamphetamine (ADDERALL) 20 MG tablet Take 1 tablet (20 mg total) by mouth 2 (two) times daily. 60 tablet 0   cloNIDine (CATAPRES) 0.1 MG tablet Take 1 tablet (0.1 mg total) by mouth 2 (two)  times daily. 180 tablet 0   cyanocobalamin (,VITAMIN B-12,) 1000 MCG/ML injection  (Patient not taking: Reported on 02/15/2021)     doxepin (SINEQUAN) 10 MG capsule Take 2-3 capsules (20-30 mg total) by mouth at bedtime. 90 capsule 2   ondansetron (ZOFRAN) 4 MG tablet Take 1 tablet (4 mg total) by mouth every 6 (six) hours as needed for nausea. (Patient not taking: Reported on 02/15/2021) 20 tablet 0   senna (SENOKOT) 8.6 MG TABS  tablet Take 2 tablets (17.2 mg total) by mouth at bedtime. (Patient not taking: Reported on 05/02/2021) 60 tablet 1   No current facility-administered medications for this visit.    Medication Side Effects: Other: Diminished effect of Adderall with combination of Gabapentin  Allergies:  Allergies  Allergen Reactions   Other Anaphylaxis    Allergy to steroids causing anaphylaxis     Aptiom [Eslicarbazepine] Other (See Comments)    Unknown reaction   Azithromycin Other (See Comments)    Unknown reaction    Bupropion Other (See Comments)     Caused depression   Carbamazepine Other (See Comments)    Unknown reaction    Dilantin [Phenytoin Sodium Extended] Other (See Comments)    Unknown reaction    Diphenhydramine Other (See Comments)    Feels like "spiders are crawling" on her   Divalproex Sodium Other (See Comments)    Unknown reaction    Keppra [Levetiracetam] Other (See Comments)    Unknown reaction    Lamictal [Lamotrigine] Other (See Comments)    Unknown reaction    Lorazepam Swelling and Other (See Comments)    Hallucinations   Lyrica [Pregabalin] Other (See Comments)    Pt reports feeling loopy and confused   Morphine And Related Other (See Comments)    Unknown reaction - (high doses)   Prednisone Other (See Comments)    Unknown reaction    Vimpat [Lacosamide] Other (See Comments)    Unknown reaction     Past Medical History:  Diagnosis Date   ADHD    Anxiety    Chronic back pain    from broken back   Depression    Right femoral fracture ([place])    [facility] spotted fever    may show low Hgb   Seizures (HCC)    has during sleep   Vitamin B 12 deficiency     Family History  Problem Relation Age of Onset   Multiple sclerosis Mother    Heart Problems Mother        by-pass   Alcohol abuse Mother    Heart Problems Sister        pacemaker    Social History   Socioeconomic History   Marital status: Married    Spouse name: Not on file    Number of children: 2   Years of education: 35   Highest education level: Not on file  Occupational History   Occupation: retired  Tobacco Use   Smoking status: Never   Smokeless tobacco: Never  Vaping Use   Vaping Use: Never used  Substance and Sexual Activity   Alcohol use: No   Drug use: No   Sexual activity: Not on file  Other Topics Concern   Not on file  Social History Narrative   Pt lives in 2 story home with her wife   Has 2 adult sons   Roughly 93 years olf college - no degree   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   Please see review of systems for further details on the patient's review from today.   Objective:   Physical Exam:  Wt 123 lb (55.8 kg)   BMI 19.26 kg/m   Physical Exam Neurological:     Mental Status: She is alert and oriented to person, place, and time.     Cranial Nerves: No dysarthria.  Psychiatric:        Attention and Perception: Attention and perception normal.        Mood and Affect: Mood normal.        Speech: Speech normal.        Behavior: Behavior is cooperative.        Thought Content: Thought content normal. Thought content is not paranoid or delusional. Thought content does not include homicidal or suicidal ideation. Thought content does not include homicidal or suicidal plan.        Cognition and Memory: Cognition and memory normal.        Judgment: Judgment normal.     Comments: Insight intact    Lab Review:     Component Value Date/Time   NA 141 09/13/2020 1523   K 4.5 09/13/2020 1523   CL 111 09/13/2020 1523   CO2 23  09/13/2020 1523   GLUCOSE 99 09/13/2020 1523   BUN 8 09/13/2020 1523   CREATININE 0.84 09/13/2020 1523   CREATININE 0.84 01/05/2020 1607   CALCIUM 9.5 09/13/2020 1523   PROT 7.5 09/13/2020 1523   ALBUMIN 3.6 09/13/2020 1523   AST 17 09/13/2020 1523   AST 19 01/05/2020 1607   ALT 10 09/13/2020 1523   ALT 14 01/05/2020 1607   ALKPHOS 111 09/13/2020 1523   BILITOT 0.3 09/13/2020 1523   BILITOT 0.3 01/05/2020 1607   GFRNONAA >60 09/13/2020 1523   GFRNONAA >60 01/05/2020 1607   GFRAA >60 04/07/2020 1527   GFRAA >60 01/05/2020 1607       Component Value Date/Time   WBC 3.4 (L) 09/11/2020 0152   RBC 3.33 (L) 09/11/2020 0152   HGB 8.8 (L) 09/11/2020 0152   HCT 28.4 (L) 09/11/2020 0152   HCT 37.7 01/05/2020 1608   PLT 380 09/11/2020 0152   MCV 85.3 09/11/2020 0152   MCH 26.4 09/11/2020 0152   MCHC 31.0 09/11/2020 0152   RDW 16.5 (H) 09/11/2020 0152   LYMPHSABS 1.0 09/08/2020 1801   MONOABS 0.3 09/08/2020 1801   EOSABS 0.1 09/08/2020 1801   BASOSABS 0.0 09/08/2020 1801    No results found for: POCLITH, LITHIUM   No results found for: PHENYTOIN, PHENOBARB, VALPROATE, CBMZ   .res Assessment: Plan:   Pt seen for 30 minutes and time spent discussing treatment plan. She reports that medications are effective overall with the exception of Adderall being less effective when combined with Gabapentin. She reports that she is working with pain management to optimize treatment of pain and hopes that she may not need as much Gabapentin after upcoming knee surgery.  Will continue current plan of care since target signs and symptoms are well controlled without any tolerability issues. Continue Adderall 20 mg po BID for ADHD.  Discussed that there has been a shortage on different strength Adderall tablets. Advised to contact office if she has difficulty obtaining Adderall since Adderall may need to be sent to a different pharmacy or script may need to be changed to a different strength tablet  that is available to continue current dose. Continue Doxepin 20-30 mg po QHS for insomnia and depression.  Continue Clonidine 0.1 mg po BID for anxiety.  Pt to follow-up in 3 months or sooner if clinically indicated.  Patient advised to contact office with any questions, adverse effects, or acute worsening in signs and symptoms.  Kelli Navarro was seen today for follow-up.  Diagnoses and all orders for this visit:  Attention deficit hyperactivity disorder (ADHD), combined type -     amphetamine-dextroamphetamine (ADDERALL) 20 MG tablet; Take 1 tablet (20 mg total) by mouth 2 (two) times daily. -     amphetamine-dextroamphetamine (ADDERALL) 20 MG tablet; Take 1 tablet (20 mg total) by mouth 2 (two) times daily. -     amphetamine-dextroamphetamine (ADDERALL) 20 MG tablet; Take 1 tablet (20 mg total) by mouth 2 (two) times daily.  Anxiety -     cloNIDine (CATAPRES) 0.1 MG tablet; Take 1 tablet (0.1 mg total) by mouth 2 (two) times daily.  Primary insomnia -     doxepin (SINEQUAN) 10 MG capsule; Take 2-3 capsules (20-30 mg total) by mouth at bedtime.    Please see After Visit Summary for patient specific instructions.  Future Appointments  Date Time Provider [place]  09/10/2021  3:00 PM Cameron Sprang, MD [facility] None    No orders of the defined types were placed in this encounter.     -------------------------------

## 2021-05-16 ENCOUNTER — Other Ambulatory Visit: Payer: Self-pay | Admitting: Psychiatry

## 2021-05-16 DIAGNOSIS — F419 Anxiety disorder, unspecified: Secondary | ICD-10-CM

## 2021-06-08 ENCOUNTER — Encounter: Payer: Self-pay | Admitting: Neurology

## 2021-06-25 ENCOUNTER — Other Ambulatory Visit: Payer: Self-pay | Admitting: Psychiatry

## 2021-06-25 DIAGNOSIS — F419 Anxiety disorder, unspecified: Secondary | ICD-10-CM

## 2021-06-26 NOTE — Telephone Encounter (Signed)
Pt has an appt on 1/31

## 2021-06-26 NOTE — Telephone Encounter (Signed)
Last seen 7/27 due back 10/27

## 2021-06-26 NOTE — Telephone Encounter (Signed)
Please schedule appt

## 2021-07-10 ENCOUNTER — Ambulatory Visit: Payer: Medicare HMO | Admitting: Neurology

## 2021-07-17 ENCOUNTER — Other Ambulatory Visit: Payer: Self-pay | Admitting: Psychiatry

## 2021-07-17 DIAGNOSIS — F5101 Primary insomnia: Secondary | ICD-10-CM

## 2021-08-06 ENCOUNTER — Other Ambulatory Visit: Payer: Self-pay

## 2021-08-06 ENCOUNTER — Encounter: Payer: Self-pay | Admitting: Psychiatry

## 2021-08-06 ENCOUNTER — Ambulatory Visit (INDEPENDENT_AMBULATORY_CARE_PROVIDER_SITE_OTHER): Payer: Medicare HMO | Admitting: Psychiatry

## 2021-08-06 VITALS — BP 125/76 | HR 72

## 2021-08-06 DIAGNOSIS — F902 Attention-deficit hyperactivity disorder, combined type: Secondary | ICD-10-CM | POA: Diagnosis not present

## 2021-08-06 DIAGNOSIS — F419 Anxiety disorder, unspecified: Secondary | ICD-10-CM

## 2021-08-06 DIAGNOSIS — F5101 Primary insomnia: Secondary | ICD-10-CM | POA: Diagnosis not present

## 2021-08-06 MED ORDER — AMPHETAMINE-DEXTROAMPHETAMINE 20 MG PO TABS: 20.0000 mg | ORAL_TABLET | Freq: Two times a day (BID) | ORAL | 0 refills | Status: DC

## 2021-08-06 MED ORDER — BELSOMRA 20 MG PO TABS: 20.0000 mg | ORAL_TABLET | Freq: Every day | ORAL | 0 refills | Status: DC

## 2021-08-06 MED ORDER — BELSOMRA 15 MG PO TABS: 15.0000 mg | ORAL_TABLET | Freq: Every day | ORAL | 0 refills | Status: DC

## 2021-08-06 MED ORDER — CLONIDINE HCL 0.1 MG PO TABS: 0.1000 mg | ORAL_TABLET | Freq: Two times a day (BID) | ORAL | 0 refills | Status: DC

## 2021-08-06 NOTE — Progress Notes (Signed)
[name] [id number] 10-Sep-1950 71 y.o.  Subjective:   Patient ID:  Kelli Navarro is a 71 y.o. (DOB 13-Aug-1950) female.  Chief Complaint:  Chief Complaint  Patient presents with   Follow-up    Anxiety, depression, and ADHD    HPI Kelli Navarro presents to the office today for follow-up of anxiety, depression, and ADHD. She reports that her anxiety has been well controlled. Denies depressed mood. She reports that two Adderall is not always adequate. She reports that about 1/2 the time she feels that 3 tabs are needed and 1/2 the time 2 tabs has been adequate. She reports that she has been having difficulty with organizing things and feels "hair-brained." Reports that she is misplacing things. She reports that she has been having difficulty with sleep and thinks this may be related to no longer taking Klonopin. She reports that she has been trying to cut back on Doxepin since she is not sure if it is helping her sleep. She reports that she took Alprazolam to help with sleep and this was effective. She reports difficulty falling asleep. Unable to quantify sleep. She describes fragmented sleep. She tries to sleep 6 hours. She reports that she has been doing projects to help her energy and motivation. Denies SI.    She is no longer having seizures. Her WBC count is now normal and no signs of anemia.    Hoping to start going to the gym.    Wife has been recovering from knee replacement surgery and has been her caregiver.    Reports that she has been tired in January with moving equipment for wife.    Buspar- caused dizziness Luvox- adverse effects Doxepin Remeron Trazodone Abilify Olanzapine Seroquel Topamax Clonidine Klonopin Gabapentin Adderall   Flowsheet Row ED to Hosp-Admission (Discharged) from 09/08/2020 in [facility] Admission (Discharged) from 08/16/2020 in [place] [facility] from 08/15/2020 in [place] RISK CATEGORY High Risk No Risk No Risk        Review of Systems:  Review of Systems  Musculoskeletal:  Positive for back pain. Negative for gait problem.  Neurological:  Negative for tremors and seizures.  Psychiatric/Behavioral:         Please refer to HPI   Medications: I have reviewed the patient's current medications.  Current Outpatient Medications  Medication Sig Dispense Refill   acetaminophen (TYLENOL) 500 MG tablet Take 500 mg by mouth every 6 (six) hours as needed (pain).     doxepin (SINEQUAN) 10 MG capsule TAKE 2-3 CAPSULES (20-30 MG TOTAL) BY MOUTH AT BEDTIME. (Patient taking differently: Take 10 mg by mouth at bedtime.) 90 capsule 0   gabapentin (NEURONTIN) 100 MG capsule Take 600 mg by mouth. 600mg  bid and 400mg  at hs     ipratropium (ATROVENT) 0.06 % nasal spray Place 2 sprays into both nostrils at bedtime.     Multiple Vitamin (MULTIVITAMIN WITH MINERALS) TABS tablet Take 1 tablet by mouth daily.     Suvorexant (BELSOMRA) 15 MG TABS Take 15 mg by mouth at bedtime. 9 tablet 0   Suvorexant (BELSOMRA) 20 MG TABS Take 20 mg by mouth at bedtime for 10 days. 9 tablet 0   topiramate (TOPAMAX) 100 MG tablet Take 2 tabs twice a day 360 tablet 3   [START ON 10/01/2021] amphetamine-dextroamphetamine (ADDERALL) 20 MG tablet Take 1 tablet (20 mg total) by mouth 2 (two) times daily. 75 tablet 0   [  START ON 09/03/2021] amphetamine-dextroamphetamine (ADDERALL) 20 MG tablet Take 1 tablet (20 mg total) by mouth 2 (two) times daily. May take an additional tablet as needed 75 tablet 0   amphetamine-dextroamphetamine (ADDERALL) 20 MG tablet Take 1 tablet (20 mg total) by mouth 2 (two) times daily. May take an additional tablet as needed 75 tablet 0   cloNIDine (CATAPRES) 0.1 MG tablet Take 1 tablet (0.1 mg total) by mouth 2 (two) times daily. 180 tablet 0   cyanocobalamin (,VITAMIN B-12,) 1000 MCG/ML injection  (Patient not taking: Reported  on 02/15/2021)     ondansetron (ZOFRAN) 4 MG tablet Take 1 tablet (4 mg total) by mouth every 6 (six) hours as needed for nausea. (Patient not taking: Reported on 02/15/2021) 20 tablet 0   senna (SENOKOT) 8.6 MG TABS tablet Take 2 tablets (17.2 mg total) by mouth at bedtime. (Patient not taking: Reported on 05/02/2021) 60 tablet 1   No current facility-administered medications for this visit.    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     Aptiom [Eslicarbazepine] Other (See Comments)    Unknown reaction   Azithromycin Other (See Comments)    Unknown reaction    Bupropion Other (See Comments)     Caused depression   Carbamazepine Other (See Comments)    Unknown reaction    Dilantin [Phenytoin Sodium Extended] Other (See Comments)    Unknown reaction    Diphenhydramine Other (See Comments)    Feels like "spiders are crawling" on her   Divalproex Sodium Other (See Comments)    Unknown reaction    Keppra [Levetiracetam] Other (See Comments)    Unknown reaction    Lamictal [Lamotrigine] Other (See Comments)    Unknown reaction    Lorazepam Swelling and Other (See Comments)    Hallucinations   Lyrica [Pregabalin] Other (See Comments)    Pt reports feeling loopy and confused   Morphine And Related Other (See Comments)    Unknown reaction - (high doses)   Prednisone Other (See Comments)    Unknown reaction    Vimpat [Lacosamide] Other (See Comments)    Unknown reaction     Past Medical History:  Diagnosis Date   ADHD    Anxiety    Chronic back pain    from broken back   Depression    Right femoral fracture ([place])    [facility] spotted fever    may show low Hgb   Seizures (HCC)    has during sleep   Vitamin B 12 deficiency     Past Medical History, Surgical history, Social history, and Family history were reviewed and updated as appropriate.   Please see review  of systems for further details on the patient's review from today.   Objective:   Physical Exam:  BP 125/76    Pulse 72   Physical Exam Constitutional:      General: She is not in acute distress. Musculoskeletal:        General: No deformity.  Neurological:     Mental Status: She is alert and oriented to person, place, and time.     Coordination: Coordination normal.  Psychiatric:        Attention and Perception: Attention and perception normal. She does not perceive auditory or visual hallucinations.        Mood and Affect: Mood normal. Mood is not anxious or depressed. Affect is not  labile, blunt, angry or inappropriate.        Speech: Speech normal.        Behavior: Behavior normal.        Thought Content: Thought content normal. Thought content is not paranoid or delusional. Thought content does not include homicidal or suicidal ideation. Thought content does not include homicidal or suicidal plan.        Cognition and Memory: Cognition and memory normal.        Judgment: Judgment normal.     Comments: Insight intact    Lab Review:     Component Value Date/Time   NA 141 09/13/2020 1523   K 4.5 09/13/2020 1523   CL 111 09/13/2020 1523   CO2 23 09/13/2020 1523   GLUCOSE 99 09/13/2020 1523   BUN 8 09/13/2020 1523   CREATININE 0.84 09/13/2020 1523   CREATININE 0.84 01/05/2020 1607   CALCIUM 9.5 09/13/2020 1523   PROT 7.5 09/13/2020 1523   ALBUMIN 3.6 09/13/2020 1523   AST 17 09/13/2020 1523   AST 19 01/05/2020 1607   ALT 10 09/13/2020 1523   ALT 14 01/05/2020 1607   ALKPHOS 111 09/13/2020 1523   BILITOT 0.3 09/13/2020 1523   BILITOT 0.3 01/05/2020 1607   GFRNONAA >60 09/13/2020 1523   GFRNONAA >60 01/05/2020 1607   GFRAA >60 04/07/2020 1527   GFRAA >60 01/05/2020 1607       Component Value Date/Time   WBC 3.4 (L) 09/11/2020 0152   RBC 3.33 (L) 09/11/2020 0152   HGB 8.8 (L) 09/11/2020 0152   HCT 28.4 (L) 09/11/2020 0152   HCT 37.7 01/05/2020 1608   PLT 380  09/11/2020 0152   MCV 85.3 09/11/2020 0152   MCH 26.4 09/11/2020 0152   MCHC 31.0 09/11/2020 0152   RDW 16.5 (H) 09/11/2020 0152   LYMPHSABS 1.0 09/08/2020 1801   MONOABS 0.3 09/08/2020 1801   EOSABS 0.1 09/08/2020 1801   BASOSABS 0.0 09/08/2020 1801    No results found for: POCLITH, LITHIUM   No results found for: PHENYTOIN, PHENOBARB, VALPROATE, CBMZ   .res Assessment: Plan:    Pt seen for 30 minutes and time spent discussing possible treatment options for insomnia and potential benefits, risks, and side effects of Belsomra. Pt agrees to trial of Belsomra. Recommend starting Belsomra 15 mg po QHS for 9 nights and then increasing to 20 mg po QHS if she has not experienced any significant side effects and is tolerating Belsomra. Pt advised to call office to request script if samples are effective.  Will change Adderall to 20 mg 1 tab po BID and 1 tab as needed and increase qty to #75 since she reports that some days she needs previous dose of TID for ADHD.  Continue Clonidine 0.1 mg po BID for anxiety.  Discussed that she can discontinue Doxepin if it is no longer effective.  Pt to follow-up in 4-6 weeks or sooner if clinically indicated.  Patient advised to contact office with any questions, adverse effects, or acute worsening in signs and symptoms.   Kelli Navarro was seen today for follow-up.  Diagnoses and all orders for this visit:  Primary insomnia -     Suvorexant (BELSOMRA) 15 MG TABS; Take 15 mg by mouth at bedtime. -     Suvorexant (BELSOMRA) 20 MG TABS; Take 20 mg by mouth at bedtime for 10 days.  Attention deficit hyperactivity disorder (ADHD), combined type -     amphetamine-dextroamphetamine (ADDERALL) 20 MG tablet; Take 1 tablet (20  mg total) by mouth 2 (two) times daily. -     amphetamine-dextroamphetamine (ADDERALL) 20 MG tablet; Take 1 tablet (20 mg total) by mouth 2 (two) times daily. May take an additional tablet as needed -     amphetamine-dextroamphetamine  (ADDERALL) 20 MG tablet; Take 1 tablet (20 mg total) by mouth 2 (two) times daily. May take an additional tablet as needed  Anxiety -     cloNIDine (CATAPRES) 0.1 MG tablet; Take 1 tablet (0.1 mg total) by mouth 2 (two) times daily.     Please see After Visit Summary for patient specific instructions.  Future Appointments  Date Time Provider [place]  09/10/2021  3:00 PM Cameron Sprang, MD [facility] None  09/12/2021  1:30 PM Thayer Headings, PMHNP [facility] None    No orders of the defined types were placed in this encounter.   -------------------------------

## 2021-08-07 ENCOUNTER — Telehealth: Payer: Self-pay | Admitting: Psychiatry

## 2021-08-07 DIAGNOSIS — F902 Attention-deficit hyperactivity disorder, combined type: Secondary | ICD-10-CM

## 2021-08-07 MED ORDER — METHYLPHENIDATE HCL 20 MG PO TABS: 20.0000 mg | ORAL_TABLET | Freq: Two times a day (BID) | ORAL | 0 refills | Status: DC

## 2021-08-07 NOTE — Telephone Encounter (Signed)
Pt stated she took ritalin in the past and it worked well for her.If you agree she will check with pharmacy to see if it's in stock

## 2021-08-07 NOTE — Telephone Encounter (Signed)
Pt called and said that there is [employer] television/film/video of adderall. She wants something similar to adderall  since all pharmacies near her have said that there is a shortage and they don't have any.So cancel the adderall script on file

## 2021-08-07 NOTE — Telephone Encounter (Signed)
Pt lm [facility]. She said she took  20 mg in the past. They don't have a lot in stock.

## 2021-08-07 NOTE — Telephone Encounter (Signed)
Please call [facility] and cancel Rx for Adderall for this month and let her know that Ritalin was sent. Please advise her to call back in about 3 weeks to let us know if she would like to continue Ritalin or go back to Adderall.

## 2021-08-07 NOTE — Telephone Encounter (Signed)
That may be a good alternative since Medicare plans usually do not cover stimulants and Ritalin would likely be a reasonable price. Does she recall her previous dosage?

## 2021-08-07 NOTE — Telephone Encounter (Signed)
Please review

## 2021-08-07 NOTE — Telephone Encounter (Signed)
Please call to ask if she has taken any other stimulants in the past.

## 2021-08-07 NOTE — Telephone Encounter (Signed)
[facility] in stock.She was on 20 mg in the past.

## 2021-08-07 NOTE — Telephone Encounter (Signed)
Pt informed and rx cancelled 

## 2021-08-14 ENCOUNTER — Telehealth: Payer: Self-pay | Admitting: Psychiatry

## 2021-08-14 DIAGNOSIS — F902 Attention-deficit hyperactivity disorder, combined type: Secondary | ICD-10-CM

## 2021-08-14 DIAGNOSIS — F5101 Primary insomnia: Secondary | ICD-10-CM

## 2021-08-14 MED ORDER — BELSOMRA 20 MG PO TABS: 20.0000 mg | ORAL_TABLET | Freq: Every day | ORAL | 2 refills | Status: DC

## 2021-08-14 NOTE — Telephone Encounter (Signed)
Please let her know that script was sent for Belsomra 20 mg. Ritalin was filled 08/07/21 and will need to wait until 09/04/21 before changing to something else. If she would like to go back to Adderall, she may want to call pharmacies around 09/04/21 to see where it is in stock and then call the office.

## 2021-08-14 NOTE — Telephone Encounter (Signed)
Patient lm requesting a refill on the Belsomra. She also lm to inform the office that the Ritalin is not working. A follow up appointment is scheduled for 3/9. # [phone].

## 2021-08-14 NOTE — Telephone Encounter (Signed)
Pt stated the 20 mg worked well for her and she would like that sent in.Also she has been on ritalin for about a week due to the adderall shortage,and she does not see that it is helping at all.She tried taking 3 and it still did nothing.

## 2021-08-15 MED ORDER — METHYLPHENIDATE HCL 20 MG PO TABS: ORAL_TABLET | ORAL | 0 refills | Status: DC

## 2021-08-15 NOTE — Telephone Encounter (Signed)
LVM with info

## 2021-08-15 NOTE — Telephone Encounter (Signed)
Pt lm that she was returning your call toni. She does have a problem with the information that you left. She is having problems with the medicine.Also the adderall script that was sent in was wrong dose. Please call her at [phone]

## 2021-08-15 NOTE — Telephone Encounter (Signed)
Pt stated the issue is that she should be taking ritalin as 2 tabs one day,and 3 tabs the next day alternating doses so it should have been #75 tabs.Please advise

## 2021-08-15 NOTE — Telephone Encounter (Signed)
Please let her know script was sent for #75 with fill date of 08/21/21.

## 2021-08-16 NOTE — Telephone Encounter (Signed)
LVM with info

## 2021-08-19 ENCOUNTER — Other Ambulatory Visit: Payer: Self-pay

## 2021-08-19 ENCOUNTER — Telehealth: Payer: Self-pay | Admitting: Psychiatry

## 2021-08-19 ENCOUNTER — Ambulatory Visit: Payer: Medicare HMO | Admitting: Neurology

## 2021-08-19 DIAGNOSIS — F902 Attention-deficit hyperactivity disorder, combined type: Secondary | ICD-10-CM

## 2021-08-19 MED ORDER — AMPHETAMINE-DEXTROAMPHETAMINE 20 MG PO TABS: 20.0000 mg | ORAL_TABLET | Freq: Two times a day (BID) | ORAL | 0 refills | Status: DC

## 2021-08-19 NOTE — Telephone Encounter (Signed)
See phone message. I called patient and she described headaches that cause N/V. She also describes hot/cold at night. She denies being around anyone who has been sick. She said years ago she went thru withdrawal from pain medications and experienced the same symptoms. Patient did say the symptoms now were a little better than they were 13 days ago when she started on Ritalin. She was tearful at the end of the phone call.

## 2021-08-19 NOTE — Telephone Encounter (Signed)
Pt lvm that stating that she is going through withdrawal from adderall. She hasn't  had any in 13 days. She is experiencing headaches and is not able to eat. Please give her a call at [phone].

## 2021-08-19 NOTE — Telephone Encounter (Signed)
Pended.

## 2021-08-19 NOTE — Telephone Encounter (Signed)
Please advise her to call different pharmacies to see if they have Adderall available and to let us know if she can find it and a script can be sent in. Please advise her to try [facility] since sometimes they have been more likely to have it recently.

## 2021-08-19 NOTE — Telephone Encounter (Signed)
Patient notified. She will call to try to find.

## 2021-08-20 ENCOUNTER — Telehealth: Payer: Self-pay

## 2021-08-20 NOTE — Telephone Encounter (Signed)
Today pt called back and says she needs a PA for the Adderall.  The PA needs to be sent to [phone].  [facility] wants [facility] at [facility] [address].    She does NOT want the Ritalin (a script looks like it is going to be sent tomorrow).  She said the Ritalin makes her jumpy.  She said called the pharmacy and asked that it be taken off her med list.  She wants the same done here.    Next appt 3/2

## 2021-08-20 NOTE — Telephone Encounter (Addendum)
Prior Authorization initiated for Adderall 20 mg #75 tablets [employer] Part D   Prior Authorization approved [id number]  Effective 07/07/21 - 07/06/22  Patient notified and Rx available at pharmacy.

## 2021-08-21 ENCOUNTER — Telehealth: Payer: Self-pay | Admitting: Psychiatry

## 2021-08-21 NOTE — Telephone Encounter (Signed)
Called pharmacy and let them know that any pending Ritalin RF should be cancelled. A note had been sent with the last Adderall RF to cancel. Pharmacy was concerned because she got a Ritalin RF on 2/1. I told pharmacist patient would not be taking both medications.  He said he would get the Adderall ready for her. I told him that a PA had already been completed/approved.

## 2021-08-21 NOTE — Telephone Encounter (Signed)
PA sent and approved. Patient notified. An Rx previously sent to pharmacy.

## 2021-08-21 NOTE — Telephone Encounter (Signed)
[facility] on what should be filled Adderall. Ritalin? Contact # [phone]

## 2021-09-03 IMAGING — CT CT HEAD W/O CM
3 of 4 series · 15 of 47 positions shown, 18 images · non-contrast
Comparison: Head CT 04/07/2020

CLINICAL DATA: Mental status change, unknown cause. Found
unresponsive.

EXAM:
CT HEAD WITHOUT CONTRAST
TECHNIQUE: Contiguous axial images were obtained from the base of the skull
through the vertex without intravenous contrast.

[Series 3: head 2.0 h70h · axial · 0.48mm/px · z∈[-109,+31]mm · 9 of 88 slices shown, 12 images]
[im 9/88  brain]
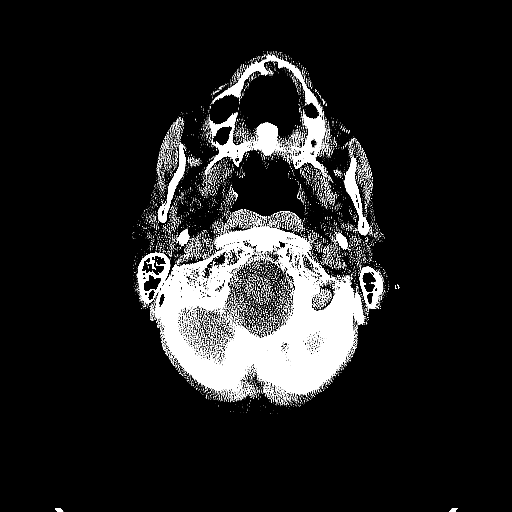
[im 9/88  bone]
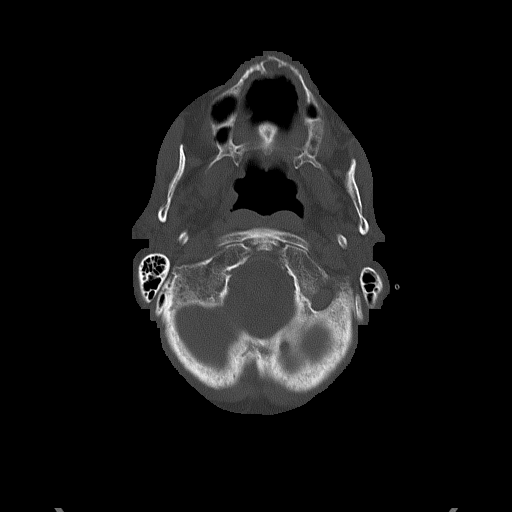
[im 18/88  brain]
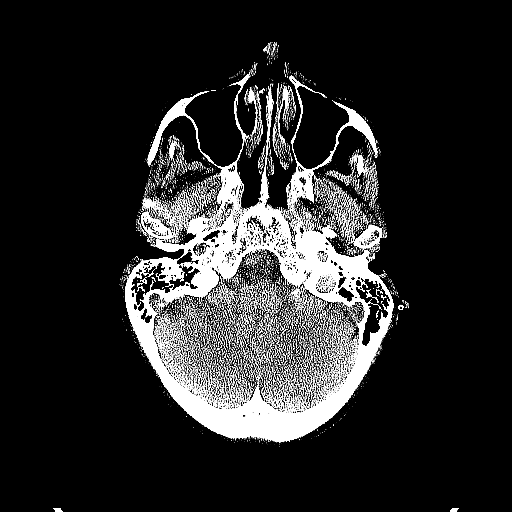
[im 27/88  brain]
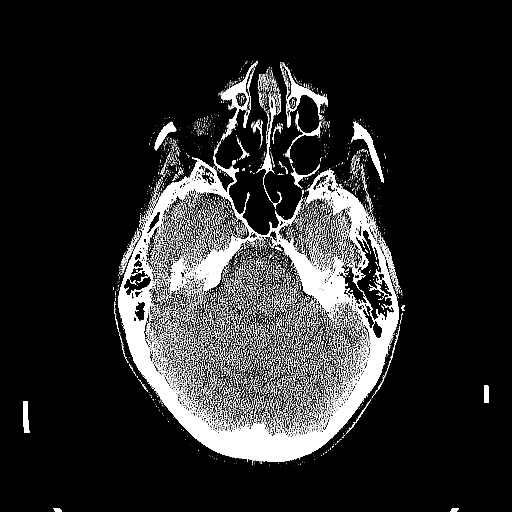
[im 35/88  brain]
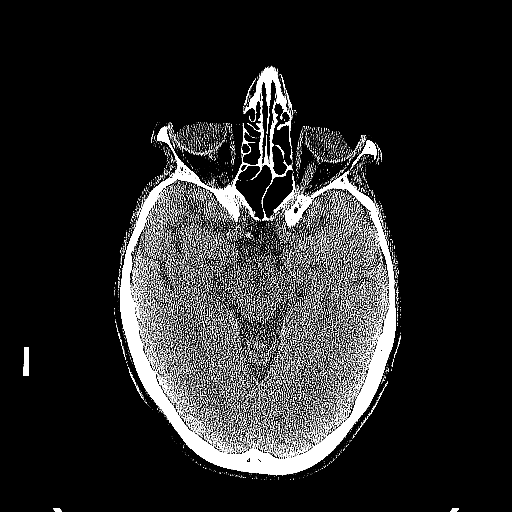
[im 44/88  brain]
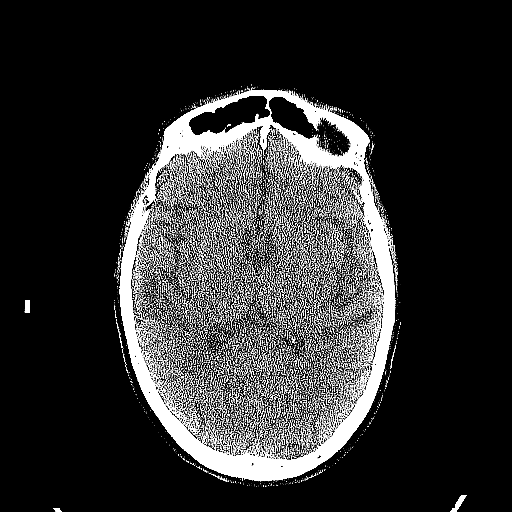
[im 44/88  bone]
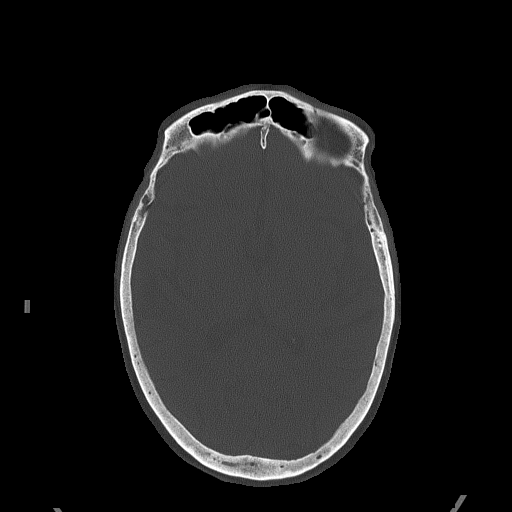
[im 53/88  brain]
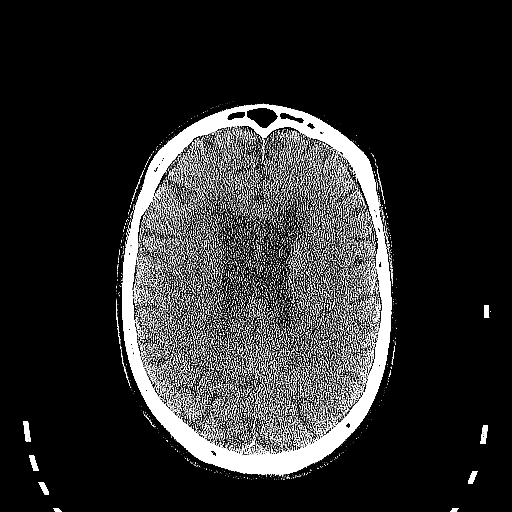
[im 61/88  brain]
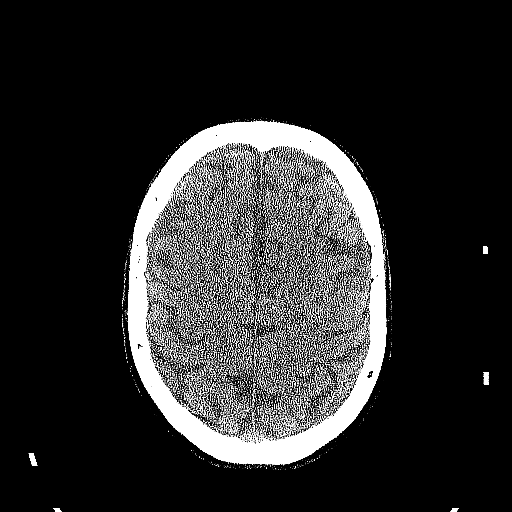
[im 70/88  brain]
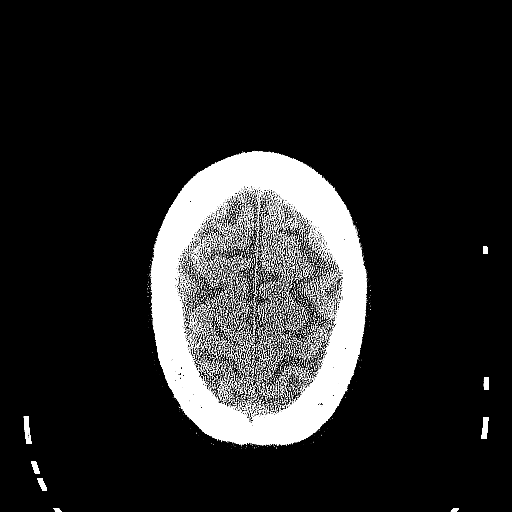
[im 79/88  brain]
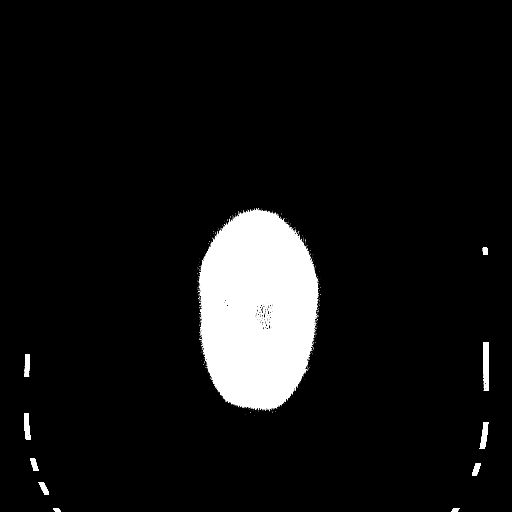
[im 79/88  bone]
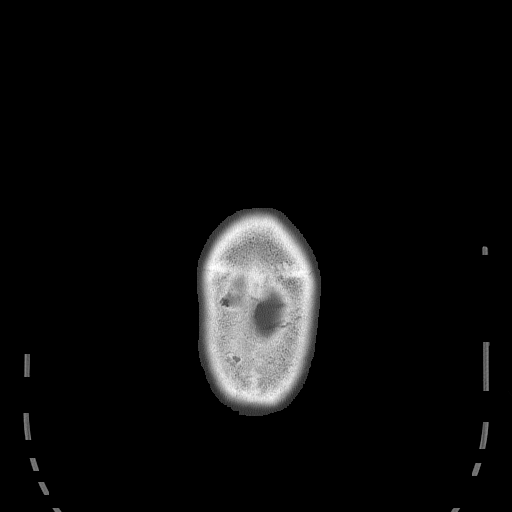

[Series 5: head 3.0 mpr cor · coronal · 0.33mm/px · 3 of 71 slices shown]
[im 24/71  brain]
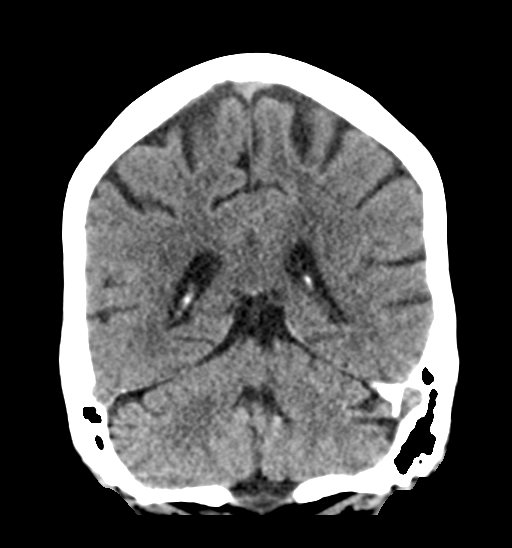
[im 32/71  brain]
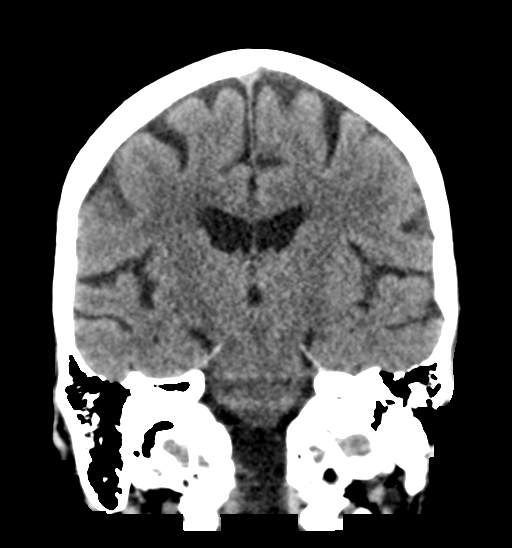
[im 39/71  brain]
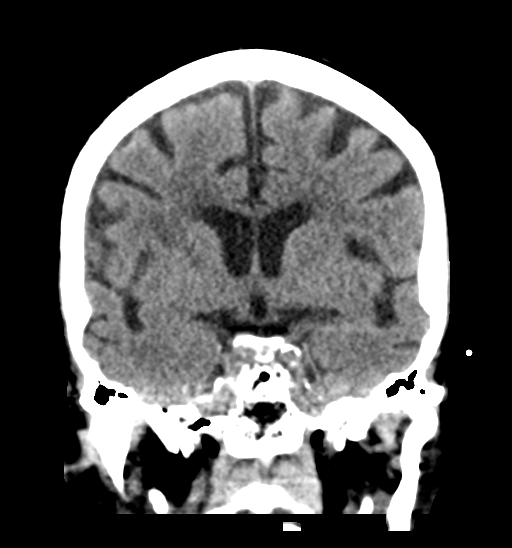

[Series 6: head 3.0 mpr sag · sagittal · 0.34mm/px · 3 of 51 slices shown]
[im 17/51  brain]
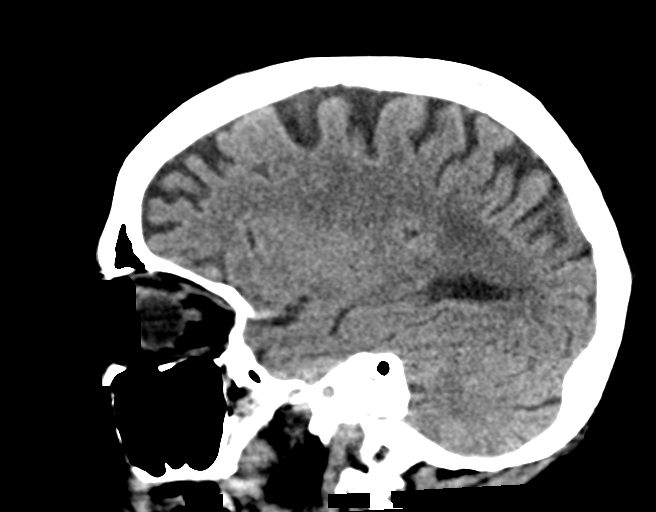
[im 26/51  brain]
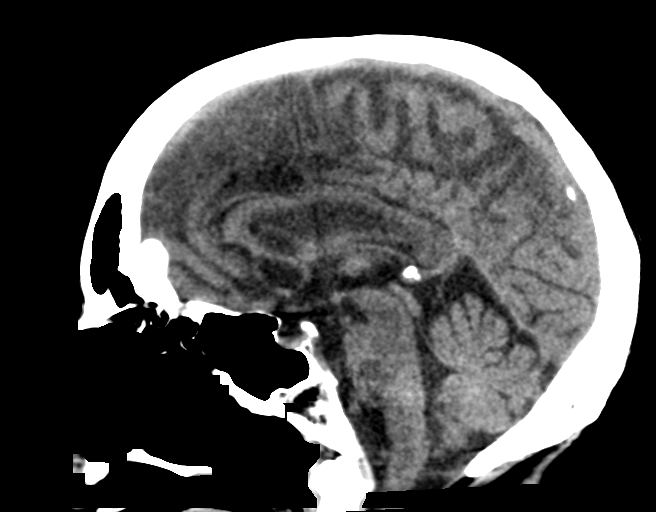
[im 34/51  brain]
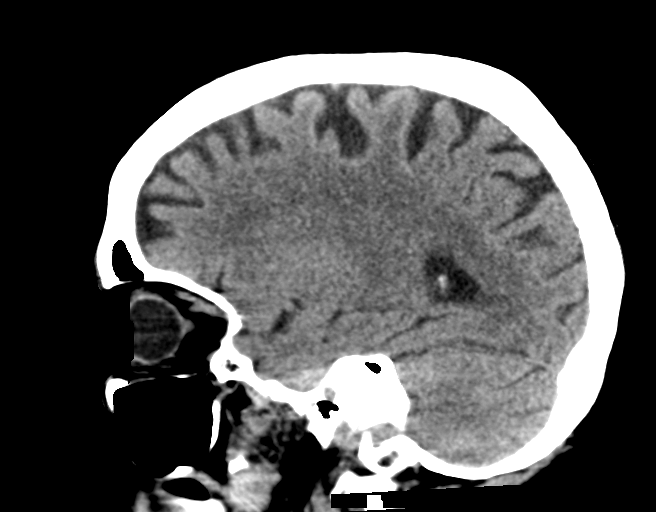

[15 of 47 positions shown; findings below may reference images not displayed]

FINDINGS: Brain: Stable brain volume. No intracranial hemorrhage, mass effect,
or midline shift. No hydrocephalus. The basilar cisterns are patent.
Stable degree of periventricular and deep chronic small vessel
ischemia, slightly age advanced. Gray-white differentiation is
preserved. No cerebral edema. No evidence of territorial infarct or
acute ischemia. No extra-axial or intracranial fluid collection.

Vascular: Insert vast

Skull: No fracture or focal lesion.

Sinuses/Orbits: Paranasal sinuses and mastoid air cells are clear.
The visualized orbits are unremarkable.

Other: None.
IMPRESSION: 1. No acute intracranial abnormality.
2. Stable chronic small vessel ischemia.

## 2021-09-05 ENCOUNTER — Encounter: Payer: Self-pay | Admitting: Psychiatry

## 2021-09-05 ENCOUNTER — Other Ambulatory Visit: Payer: Self-pay

## 2021-09-05 ENCOUNTER — Ambulatory Visit (INDEPENDENT_AMBULATORY_CARE_PROVIDER_SITE_OTHER): Payer: Medicare HMO | Admitting: Psychiatry

## 2021-09-05 VITALS — BP 123/68 | HR 90 | Wt 126.0 lb

## 2021-09-05 DIAGNOSIS — F419 Anxiety disorder, unspecified: Secondary | ICD-10-CM

## 2021-09-05 DIAGNOSIS — F902 Attention-deficit hyperactivity disorder, combined type: Secondary | ICD-10-CM | POA: Diagnosis not present

## 2021-09-05 DIAGNOSIS — F5101 Primary insomnia: Secondary | ICD-10-CM | POA: Diagnosis not present

## 2021-09-05 MED ORDER — AMPHETAMINE-DEXTROAMPHETAMINE 20 MG PO TABS: 20.0000 mg | ORAL_TABLET | Freq: Two times a day (BID) | ORAL | 0 refills | Status: DC

## 2021-09-05 MED ORDER — CLONIDINE HCL 0.1 MG PO TABS: 0.1000 mg | ORAL_TABLET | Freq: Two times a day (BID) | ORAL | 0 refills | Status: DC

## 2021-09-05 MED ORDER — HYDROXYZINE PAMOATE 25 MG PO CAPS: 25.0000 mg | ORAL_CAPSULE | Freq: Every evening | ORAL | 1 refills | Status: DC | PRN

## 2021-09-05 MED ORDER — AMPHETAMINE-DEXTROAMPHETAMINE 20 MG PO TABS: ORAL_TABLET | ORAL | 0 refills | Status: DC

## 2021-09-05 NOTE — Progress Notes (Signed)
KONYA FAUBLE [id number] [birth year] 71 y.o.  Subjective:   Patient ID:  Kelli Navarro is a 71 y.o. (DOB [birth year]) female.  Chief Complaint:  Chief Complaint  Patient presents with   Follow-up    ADHD, depression, anxiety, and insomnia    HPI Kelli Navarro presents to the office today for follow-up of ADHD, depression, anxiety, and insomnia. She reports that she had stimulant withdrawal about 5 days after being without Adderall and that this resolved after re-starting Adderall.   She reports that Belsomra is effective but cost-prohibitive ($200 a month). She reports that she has been sleeping 6 hours a night with Belsomra. Mood has been stable. Denies significant anxiety. Energy and motivation have been good. Appetite has been good. She reports that concentration has been "good." Denies SI.    Adderall filled 08/21/21 Belsomra filled 08/15/21   Buspar- caused dizziness Luvox- adverse effects Doxepin Remeron Trazodone Abilify Olanzapine Seroquel Topamax Clonidine Klonopin Gabapentin Adderall Ritalin-Not as effective as Adderall Belsomra- Effective. Denies side effects. Cost-prohibitive.     Flowsheet Row ED to Hosp-Admission (Discharged) from 09/08/2020 in [facility] Admission (Discharged) from 08/16/2020 in [facility] from 08/15/2020 in [facility] CATEGORY High Risk No Risk No Risk        Review of Systems:  Review of Systems  Cardiovascular:  Negative for chest pain and palpitations.       Reports BP has been more stable with improved sleep.   Musculoskeletal:  Positive for back pain. Negative for gait problem.       She has been increasing physical activity per her doctor.   Psychiatric/Behavioral:         Please refer to HPI   Medications: I have reviewed the patient's current medications.  Current Outpatient Medications   Medication Sig Dispense Refill   [START ON 10/16/2021] amphetamine-dextroamphetamine (ADDERALL) 20 MG tablet Take 1 tab po BID and 1 tab as needed 75 tablet 0   gabapentin (NEURONTIN) 600 MG tablet Take 600 mg by mouth at bedtime.     hydrOXYzine (VISTARIL) 25 MG capsule Take 1 capsule (25 mg total) by mouth at bedtime as needed (Insomnia). 30 capsule 1   ipratropium (ATROVENT) 0.06 % nasal spray Place 2 sprays into both nostrils at bedtime.     Multiple Vitamin (MULTIVITAMIN WITH MINERALS) TABS tablet Take 1 tablet by mouth daily.     Suvorexant (BELSOMRA) 20 MG TABS Take 20 mg by mouth at bedtime. 30 tablet 2   topiramate (TOPAMAX) 100 MG tablet Take 2 tabs twice a day 360 tablet 3   acetaminophen (TYLENOL) 500 MG tablet Take 500 mg by mouth every 6 (six) hours as needed (pain). (Patient not taking: Reported on 09/05/2021)     [START ON 09/18/2021] amphetamine-dextroamphetamine (ADDERALL) 20 MG tablet Take 1 tablet (20 mg total) by mouth 2 (two) times daily. May take an additional tablet as needed 75 tablet 0   cloNIDine (CATAPRES) 0.1 MG tablet Take 1 tablet (0.1 mg total) by mouth 2 (two) times daily. 180 tablet 0   cyanocobalamin (,VITAMIN B-12,) 1000 MCG/ML injection  (Patient not taking: Reported on 02/15/2021)     traMADol (ULTRAM) 50 MG tablet Take 50 mg by mouth every 6 (six) hours as needed.     No current facility-administered medications for this visit.    Medication Side Effects: None  Allergies:  Allergies  Allergen Reactions   Other Anaphylaxis  Allergy to steroids causing anaphylaxis     Aptiom [Eslicarbazepine] Other (See Comments)    Unknown reaction   Azithromycin Other (See Comments)    Unknown reaction    Bupropion Other (See Comments)     Caused depression   Carbamazepine Other (See Comments)    Unknown reaction    Dilantin [Phenytoin Sodium Extended] Other (See Comments)    Unknown reaction    Diphenhydramine Other (See Comments)    Feels like "spiders are  crawling" on her   Divalproex Sodium Other (See Comments)    Unknown reaction    Keppra [Levetiracetam] Other (See Comments)    Unknown reaction    Lamictal [Lamotrigine] Other (See Comments)    Unknown reaction    Lorazepam Swelling and Other (See Comments)    Hallucinations   Lyrica [Pregabalin] Other (See Comments)    Pt reports feeling loopy and confused   Morphine And Related Other (See Comments)    Unknown reaction - (high doses)   Prednisone Other (See Comments)    Unknown reaction    Vimpat [Lacosamide] Other (See Comments)    Unknown reaction     Past Medical History:  Diagnosis Date   ADHD    Anxiety    Chronic back pain    from broken back   Depression    Right femoral fracture ([place])    [facility] spotted fever    may show low Hgb   Seizures (HCC)    has during sleep   Vitamin B 12 deficiency     Past Medical History, Surgical history, Social history, and Family history were reviewed and updated as appropriate.   Please see review of systems for further details on the patient's review from today.   Objective:   Physical Exam:  BP 123/68    Pulse 90    Wt 126 lb (57.2 kg)    BMI 19.73 kg/m   Physical Exam Constitutional:      General: She is not in acute distress. Musculoskeletal:        General: No deformity.  Neurological:     Mental Status: She is alert and oriented to person, place, and time.     Coordination: Coordination normal.  Psychiatric:        Attention and Perception: Attention and perception normal. She does not perceive auditory or visual hallucinations.        Mood and Affect: Mood normal. Mood is not anxious or depressed. Affect is not labile, blunt, angry or inappropriate.        Speech: Speech normal.        Behavior: Behavior normal.        Thought Content: Thought content normal. Thought content is not paranoid or delusional. Thought content does not include homicidal or suicidal ideation. Thought content does not  include homicidal or suicidal plan.        Cognition and Memory: Cognition and memory normal.        Judgment: Judgment normal.     Comments: Insight intact    Lab Review:     Component Value Date/Time   NA 141 09/13/2020 1523   K 4.5 09/13/2020 1523   CL 111 09/13/2020 1523   CO2 23 09/13/2020 1523   GLUCOSE 99 09/13/2020 1523   BUN 8 09/13/2020 1523   CREATININE 0.84 09/13/2020 1523   CREATININE 0.84 01/05/2020 1607   CALCIUM 9.5 09/13/2020 1523   PROT 7.5 09/13/2020 1523   ALBUMIN 3.6 09/13/2020 1523  AST 17 09/13/2020 1523   AST 19 01/05/2020 1607   ALT 10 09/13/2020 1523   ALT 14 01/05/2020 1607   ALKPHOS 111 09/13/2020 1523   BILITOT 0.3 09/13/2020 1523   BILITOT 0.3 01/05/2020 1607   GFRNONAA >60 09/13/2020 1523   GFRNONAA >60 01/05/2020 1607   GFRAA >60 04/07/2020 1527   GFRAA >60 01/05/2020 1607       Component Value Date/Time   WBC 3.4 (L) 09/11/2020 0152   RBC 3.33 (L) 09/11/2020 0152   HGB 8.8 (L) 09/11/2020 0152   HCT 28.4 (L) 09/11/2020 0152   HCT 37.7 01/05/2020 1608   PLT 380 09/11/2020 0152   MCV 85.3 09/11/2020 0152   MCH 26.4 09/11/2020 0152   MCHC 31.0 09/11/2020 0152   RDW 16.5 (H) 09/11/2020 0152   LYMPHSABS 1.0 09/08/2020 1801   MONOABS 0.3 09/08/2020 1801   EOSABS 0.1 09/08/2020 1801   BASOSABS 0.0 09/08/2020 1801    No results found for: POCLITH, LITHIUM   No results found for: PHENYTOIN, PHENOBARB, VALPROATE, CBMZ   .res Assessment: Plan:   Patient seen for 30 minutes and time spent discussing response to [place] and concerns about cost.  Will request that office nurse follow up to determine if there is a way to make [place] more affordable since patient reports that Belsomra has been very effective and well tolerated.  Discussed trial of hydroxyzine as needed for insomnia since patient reports that doxepin is no longer effective.  Patient advised to stop doxepin.  We will start hydroxyzine 25 mg at bedtime as needed for  insomnia. Continue Adderall 20 mg twice daily and 1 additional tab as needed for ADHD. Continue clonidine 0.1 mg twice daily for anxiety. Patient to follow-up in 2 months or sooner if clinically indicated. Patient advised to contact office with any questions, adverse effects, or acute worsening in signs and symptoms.   Lyanne was seen today for follow-up.  Diagnoses and all orders for this visit:  Primary insomnia -     hydrOXYzine (VISTARIL) 25 MG capsule; Take 1 capsule (25 mg total) by mouth at bedtime as needed (Insomnia).  Anxiety -     cloNIDine (CATAPRES) 0.1 MG tablet; Take 1 tablet (0.1 mg total) by mouth 2 (two) times daily.  Attention deficit hyperactivity disorder (ADHD), combined type -     amphetamine-dextroamphetamine (ADDERALL) 20 MG tablet; Take 1 tablet (20 mg total) by mouth 2 (two) times daily. May take an additional tablet as needed -     amphetamine-dextroamphetamine (ADDERALL) 20 MG tablet; Take 1 tab po BID and 1 tab as needed     Please see After Visit Summary for patient specific instructions.  Future Appointments  Date Time Provider Sarita  09/10/2021  3:00 PM Cameron Sprang, MD [facility] None  11/05/2021  1:30 PM Thayer Headings, PMHNP [facility] None    No orders of the defined types were placed in this encounter.   -------------------------------

## 2021-09-10 ENCOUNTER — Ambulatory Visit: Payer: Medicare HMO | Admitting: Neurology

## 2021-09-12 ENCOUNTER — Ambulatory Visit: Payer: Medicare HMO | Admitting: Psychiatry

## 2021-10-05 ENCOUNTER — Other Ambulatory Visit: Payer: Self-pay | Admitting: Psychiatry

## 2021-10-05 DIAGNOSIS — F5101 Primary insomnia: Secondary | ICD-10-CM

## 2021-10-16 ENCOUNTER — Emergency Department (HOSPITAL_COMMUNITY): Payer: Medicare HMO

## 2021-10-16 ENCOUNTER — Observation Stay (HOSPITAL_COMMUNITY)
Admission: EM | Admit: 2021-10-16 | Discharge: 2021-10-18 | Disposition: A | Discharge status: Home / Self Care | Payer: Medicare HMO | Attending: Internal Medicine | Admitting: Internal Medicine

## 2021-10-16 ENCOUNTER — Encounter (HOSPITAL_COMMUNITY): Payer: Self-pay | Admitting: *Deleted

## 2021-10-16 DIAGNOSIS — Z79899 Other long term (current) drug therapy: Secondary | ICD-10-CM | POA: Insufficient documentation

## 2021-10-16 DIAGNOSIS — Z87891 Personal history of nicotine dependence: Secondary | ICD-10-CM | POA: Diagnosis not present

## 2021-10-16 DIAGNOSIS — Z7982 Long term (current) use of aspirin: Secondary | ICD-10-CM | POA: Diagnosis not present

## 2021-10-16 DIAGNOSIS — F909 Attention-deficit hyperactivity disorder, unspecified type: Secondary | ICD-10-CM | POA: Diagnosis present

## 2021-10-16 DIAGNOSIS — Z96643 Presence of artificial hip joint, bilateral: Secondary | ICD-10-CM | POA: Diagnosis not present

## 2021-10-16 DIAGNOSIS — K8012 Calculus of gallbladder with acute and chronic cholecystitis without obstruction: Secondary | ICD-10-CM | POA: Diagnosis not present

## 2021-10-16 DIAGNOSIS — K8020 Calculus of gallbladder without cholecystitis without obstruction: Secondary | ICD-10-CM

## 2021-10-16 DIAGNOSIS — R1012 Left upper quadrant pain: Secondary | ICD-10-CM | POA: Diagnosis present

## 2021-10-16 DIAGNOSIS — Z20822 Contact with and (suspected) exposure to covid-19: Secondary | ICD-10-CM | POA: Diagnosis not present

## 2021-10-16 DIAGNOSIS — F39 Unspecified mood [affective] disorder: Secondary | ICD-10-CM | POA: Diagnosis present

## 2021-10-16 DIAGNOSIS — G40909 Epilepsy, unspecified, not intractable, without status epilepticus: Secondary | ICD-10-CM

## 2021-10-16 DIAGNOSIS — E876 Hypokalemia: Secondary | ICD-10-CM | POA: Diagnosis not present

## 2021-10-16 DIAGNOSIS — G8929 Other chronic pain: Secondary | ICD-10-CM | POA: Diagnosis present

## 2021-10-16 LAB — COMPREHENSIVE METABOLIC PANEL
ALT: 11 U/L (ref 0–44)
AST: 18 U/L (ref 15–41)
Albumin: 4 g/dL (ref 3.5–5.0)
Alkaline Phosphatase: 105 U/L (ref 38–126)
Anion gap: 14 (ref 5–15)
BUN: 20 mg/dL (ref 8–23)
CO2: 14 mmol/L — ABNORMAL LOW (ref 22–32)
Calcium: 9.4 mg/dL (ref 8.9–10.3)
Chloride: 111 mmol/L (ref 98–111)
Creatinine, Ser: 0.88 mg/dL (ref 0.44–1.00)
GFR, Estimated: >60 mL/min (ref >60)
Glucose, Bld: 73 mg/dL (ref 70–99)
Potassium: 2.8 mmol/L — ABNORMAL LOW (ref 3.5–5.1)
Sodium: 139 mmol/L (ref 135–145)
Total Bilirubin: 0.6 mg/dL (ref 0.3–1.2)
Total Protein: 7.6 g/dL (ref 6.5–8.1)

## 2021-10-16 LAB — URINALYSIS, ROUTINE W REFLEX MICROSCOPIC
Bilirubin Urine: NEGATIVE
Glucose, UA: NEGATIVE mg/dL
Hgb urine dipstick: NEGATIVE
Ketones, ur: 20 mg/dL — AB
Leukocytes,Ua: NEGATIVE
Nitrite: NEGATIVE
Protein, ur: NEGATIVE mg/dL
Specific Gravity, Urine: 1.017 (ref 1.005–1.030)
pH: 5 (ref 5.0–8.0)

## 2021-10-16 LAB — CBC WITH DIFFERENTIAL/PLATELET
Abs Immature Granulocytes: 0.01 10*3/uL (ref 0.00–0.07)
Basophils Absolute: 0 10*3/uL (ref 0.0–0.1)
Basophils Relative: 1 %
Eosinophils Absolute: 0 10*3/uL (ref 0.0–0.5)
Eosinophils Relative: 1 %
HCT: 46 % (ref 36.0–46.0)
Hemoglobin: 13.9 g/dL (ref 12.0–15.0)
Immature Granulocytes: 0 %
Lymphocytes Relative: 32 %
Lymphs Abs: 1.1 10*3/uL (ref 0.7–4.0)
MCH: 27.1 pg (ref 26.0–34.0)
MCHC: 30.2 g/dL (ref 30.0–36.0)
MCV: 89.8 fL (ref 80.0–100.0)
Monocytes Absolute: 0.3 10*3/uL (ref 0.1–1.0)
Monocytes Relative: 9 %
Neutro Abs: 1.9 10*3/uL (ref 1.7–7.7)
Neutrophils Relative %: 57 %
Platelets: 350 10*3/uL (ref 150–400)
RBC: 5.12 MIL/uL — ABNORMAL HIGH (ref 3.87–5.11)
RDW: 13.8 % (ref 11.5–15.5)
WBC: 3.4 10*3/uL — ABNORMAL LOW (ref 4.0–10.5)
nRBC: 0 % (ref 0.0–0.2)

## 2021-10-16 LAB — LIPASE, BLOOD: Lipase: 38 U/L (ref 11–51)

## 2021-10-16 MED ORDER — SODIUM CHLORIDE 0.9 % IV BOLUS
1000.0000 mL | Freq: Once | INTRAVENOUS | Status: AC
  Administered 2021-10-16: 1000 mL via INTRAVENOUS

## 2021-10-16 MED ORDER — ONDANSETRON HCL 4 MG/2ML IJ SOLN
4.0000 mg | Freq: Once | INTRAMUSCULAR | Status: AC
  Administered 2021-10-16: 4 mg via INTRAVENOUS
  Filled 2021-10-16: qty 2

## 2021-10-16 MED ORDER — POTASSIUM CHLORIDE CRYS ER 20 MEQ PO TBCR
40.0000 meq | EXTENDED_RELEASE_TABLET | Freq: Once | ORAL | Status: AC
  Administered 2021-10-16: 40 meq via ORAL
  Filled 2021-10-16: qty 2

## 2021-10-16 MED ORDER — FAMOTIDINE IN NACL 20-0.9 MG/50ML-% IV SOLN
20.0000 mg | Freq: Once | INTRAVENOUS | Status: AC
  Administered 2021-10-16: 20 mg via INTRAVENOUS
  Filled 2021-10-16: qty 50

## 2021-10-16 MED ORDER — POTASSIUM CHLORIDE 10 MEQ/100ML IV SOLN
10.0000 meq | INTRAVENOUS | Status: AC
  Administered 2021-10-16 – 2021-10-17 (×2): 10 meq via INTRAVENOUS
  Filled 2021-10-16 (×2): qty 100

## 2021-10-16 NOTE — ED Triage Notes (Signed)
Pt arrived by [facility] from home. Having mid lower abd pain x 1 week. Has been to pcp and had ct scan done, diagnosed with gallstones. Has not been eating, denies vomiting.  ?

## 2021-10-16 NOTE — ED Provider Notes (Signed)
?[facility] ?Provider Note ? ? ?CSN: [id number] ?Arrival date & time: 10/16/21  1356 ? ?  ? ?History ? ?Chief Complaint  ?Patient presents with  ? Abdominal Pain  ? ? ?Kelli Navarro is a 71 y.o. female who presents to the emergency department complaining of left upper quadrant abdominal pain onset 1 week. Patient was evaluated at her primary care office yesterday and had a CT abdomen pelvis with contrast completed that was notable for cholelithiasis without acute cholecystitis.  No other acute intra-abdominal abnormalities noted on exam.  Patient notes she is here today because her pain increased today.  She has tried Tylenol and ibuprofen with no relief of her symptoms.  Patient notes that she has not been eating because eating exacerbates her abdominal pain.  She notes that it is not because of nausea or vomiting that she is not eating.  Patient denies fever, chills, dysuria, hematuria, chest pain, shortness of breath, vomiting.  Patient notes nausea and diarrhea.  She notes she had approximately 10 episodes of diarrhea since the onset of her symptoms. ? ?The history is provided by the patient. No language interpreter was used.  ? ?  ? ?Home Medications ?Prior to Admission medications   ?Medication Sig Start Date End Date Taking? Authorizing Provider  ?acetaminophen (TYLENOL) 500 MG tablet Take 500 mg by mouth every 6 (six) hours as needed (pain). ?Patient not taking: Reported on 09/05/2021    [provider]  ?amphetamine-dextroamphetamine (ADDERALL) 20 MG tablet Take 1 tablet (20 mg total) by mouth 2 (two) times daily. May take an additional tablet as needed 09/18/21   Thayer Headings, PMHNP  ?amphetamine-dextroamphetamine (ADDERALL) 20 MG tablet Take 1 tab po BID and 1 tab as needed 10/16/21   Thayer Headings, PMHNP  ?cloNIDine (CATAPRES) 0.1 MG tablet Take 1 tablet (0.1 mg total) by mouth 2 (two) times daily. 09/05/21   Thayer Headings, PMHNP  ?cyanocobalamin  (,VITAMIN B-12,) 1000 MCG/ML injection     [provider]  ?gabapentin (NEURONTIN) 600 MG tablet Take 600 mg by mouth at bedtime. 01/03/20   [provider]  ?hydrOXYzine (VISTARIL) 25 MG capsule Take 1 capsule (25 mg total) by mouth at bedtime as needed (Insomnia). 09/05/21   Thayer Headings, PMHNP  ?ipratropium (ATROVENT) 0.06 % nasal spray Place 2 sprays into both nostrils at bedtime. 10/11/19   [provider]  ?Multiple Vitamin (MULTIVITAMIN WITH MINERALS) TABS tablet Take 1 tablet by mouth daily.    [provider]  ?Suvorexant (BELSOMRA) 20 MG TABS Take 20 mg by mouth at bedtime. 08/14/21 09/13/21  Thayer Headings, Aten  ?topiramate (TOPAMAX) 100 MG tablet Take 2 tabs twice a day 02/15/21   Cameron Sprang, MD  ?traMADol (ULTRAM) 50 MG tablet Take 50 mg by mouth every 6 (six) hours as needed. 08/26/21   [provider]  ?   ? ?Allergies    ?Other, Aptiom [eslicarbazepine], Azithromycin, Bupropion, Carbamazepine, Dilantin [phenytoin sodium extended], Diphenhydramine, Divalproex sodium, Keppra [levetiracetam], Lamictal [lamotrigine], Lorazepam, Lyrica [pregabalin], Morphine and related, Prednisone, and Vimpat [lacosamide]   ? ?Review of Systems   ?Review of Systems  ?Constitutional:  Negative for chills and fever.  ?Respiratory:  Negative for shortness of breath.   ?Cardiovascular:  Negative for chest pain.  ?Gastrointestinal:  Positive for abdominal pain, diarrhea and nausea. Negative for vomiting.  ?Genitourinary:  Negative for dysuria and hematuria.  ?All other systems reviewed and are negative. ? ?Physical Exam ?Updated Vital Signs ?BP 112/61 (  BP Location: Right Arm)   Pulse 88   Temp 98.9 ?F (37.2 ?C) (Oral)   Resp 16   SpO2 100%  ?Physical Exam ?Vitals and nursing note reviewed.  ?Constitutional:   ?   General: She is not in acute distress. ?   Appearance: She is not diaphoretic.  ?HENT:  ?   Head: Normocephalic and atraumatic.  ?   Mouth/Throat:  ?   Pharynx: No  oropharyngeal exudate.  ?Eyes:  ?   General: No scleral icterus. ?   Conjunctiva/sclera: Conjunctivae normal.  ?Cardiovascular:  ?   Rate and Rhythm: Normal rate and regular rhythm.  ?   Pulses: Normal pulses.  ?   Heart sounds: Normal heart sounds.  ?Pulmonary:  ?   Effort: Pulmonary effort is normal. No respiratory distress.  ?   Breath sounds: Normal breath sounds. No wheezing.  ?Abdominal:  ?   General: Bowel sounds are normal.  ?   Palpations: Abdomen is soft. There is no mass.  ?   Tenderness: There is abdominal tenderness in the left upper quadrant. There is no right CVA tenderness, left CVA tenderness, guarding or rebound. Negative signs include Murphy's sign and McBurney's sign.  ?   Comments: Mild left upper quadrant abdominal tenderness to palpation on exam.  Negative Murphy's or McBurney sign.  No CVA tenderness to palpation noted bilaterally.  ?Musculoskeletal:     ?   General: Normal range of motion.  ?   Cervical back: Normal range of motion and neck supple.  ?Skin: ?   General: Skin is warm and dry.  ?Neurological:  ?   Mental Status: She is alert.  ?Psychiatric:     ?   Behavior: Behavior normal.  ? ? ?ED Results / Procedures / Treatments   ?Labs ?(all labs ordered are listed, but only abnormal results are displayed) ?Labs Reviewed  ?CBC WITH DIFFERENTIAL/PLATELET - Abnormal; Notable for the following components:  ?    Result Value  ? WBC 3.4 (*)   ? RBC 5.12 (*)   ? All other components within normal limits  ?COMPREHENSIVE METABOLIC PANEL - Abnormal; Notable for the following components:  ? Potassium 2.8 (*)   ? CO2 14 (*)   ? All other components within normal limits  ?URINALYSIS, ROUTINE W REFLEX MICROSCOPIC - Abnormal; Notable for the following components:  ? Ketones, ur 20 (*)   ? All other components within normal limits  ?RESP PANEL BY RT-PCR (FLU A&B, COVID) ARPGX2  ?LIPASE, BLOOD  ? ? ?EKG ?None ? ?Radiology ?US Abdomen Limited ? ?Result Date: 10/16/2021 ?CLINICAL DATA:  abd pain EXAM:  ULTRASOUND ABDOMEN LIMITED RIGHT UPPER QUADRANT COMPARISON:  None. FINDINGS: Gallbladder: Calcified gallstone within the gallbladder lumen measuring up to 14 mm. No gallbladder wall thickening or pericholecystic fluid visualized. No sonographic Murphy sign noted by sonographer. Common bile duct: Diameter: 8 mm. Liver: Limited evaluation of the left hepatic lobe due to bowel gas. No focal lesion identified. Within normal limits in parenchymal echogenicity. Portal vein is patent on color Doppler imaging with normal direction of blood flow towards the liver. Other: None. IMPRESSION: 1. Cholelithiasis with no acute cholecystitis. 2. Limited evaluation of the left hepatic lobe due to bowel gas. Electronically Signed   By: Iven Finn M.D.   On: 10/16/2021 16:25   ? ?Procedures ?Procedures  ? ? ?Medications Ordered in ED ?Medications - No data to display ? ?ED Course/ Medical Decision Making/ A&P ?Clinical Course as of 10/17/21 0008  ?  Wed Oct 16, 2021  ?2253 Potassium(!): 2.8 ?Repleted in the ED [SB]  ?2307 Pt re-evaluated and patient noted to be actively vomiting in the room. Provided with emesis bag. Discussed with patient lab and imaging findings. Answered all available questions. Offered pain medication, pt declined at this time. [SB]  ?2341 Consult with General Surgery, Dr. Windle Guard who recommends admission and GI consult. Will evaluate the patient in the morning. [SB]  ?2358 Discussed with patient admission plans.  Patient agreeable at this time. [SB]  ?  ?Clinical Course User Index ?[SB] Takela Varden A, PA-C  ? ?                        ?Medical Decision Making ?Amount and/or Complexity of Data Reviewed ?Labs:  Decision-making details documented in ED Course. ? ?Risk ?Prescription drug management. ? ? ?Patient presents to the emergency department with left upper quadrant of epigastric abdominal pain onset 5 days.  Patient denies any urinary symptoms.  Has had associated nausea and watery nonbloody diarrhea.   Vital signs stable, patient afebrile, not tachycardic or hypoxic.  On exam patient with mild tenderness to palpation noted to left upper quadrant region.  Negative Murphy sign.  Negative McBurney sign.  Negative CVA te

## 2021-10-16 NOTE — ED Provider Triage Note (Signed)
Emergency Medicine Provider Triage Evaluation Note ? ?Kelli Navarro , a 71 y.o. female  was evaluated in triage.  Pt complains of abd pain. ? ?Review of Systems  ?Positive: Upper abd pain, nausea, vomiting ?Negative: Fever, cough, sob, dysuria ? ?Physical Exam  ?BP 136/78 (BP Location: Left Arm)   Pulse 94   Temp 98.9 ?F (37.2 ?C) (Oral)   Resp 16   SpO2 98%  ?Gen:   Awake, no distress   ?Resp:  Normal effort  ?MSK:   Moves extremities without difficulty  ?Other:   ? ?Medical Decision Making  ?Medically screening exam initiated at 3:16 PM.  Appropriate orders placed.  [name] was informed that the remainder of the evaluation will be completed by another provider, this initial triage assessment does not replace that evaluation, and the importance of remaining in the ED until their evaluation is complete. ? ?Pt report being diagnosed with gallstones previously, had a CT scan done yesterday showing gallstone according to pt.  Here with worsening upper abd pain.   ?  ?Domenic Moras, PA-C ?10/16/21 1519 ? ?

## 2021-10-17 ENCOUNTER — Observation Stay (HOSPITAL_COMMUNITY): Payer: Medicare HMO | Admitting: Certified Registered Nurse Anesthetist

## 2021-10-17 ENCOUNTER — Observation Stay (HOSPITAL_COMMUNITY): Payer: Medicare HMO

## 2021-10-17 ENCOUNTER — Other Ambulatory Visit: Payer: Self-pay

## 2021-10-17 ENCOUNTER — Observation Stay (HOSPITAL_BASED_OUTPATIENT_CLINIC_OR_DEPARTMENT_OTHER): Payer: Medicare HMO | Admitting: Certified Registered Nurse Anesthetist

## 2021-10-17 ENCOUNTER — Encounter (HOSPITAL_COMMUNITY)
Admission: EM | Disposition: A | Discharge status: Home / Self Care | Payer: Self-pay | Source: Home / Self Care | Attending: Emergency Medicine

## 2021-10-17 ENCOUNTER — Encounter (HOSPITAL_COMMUNITY): Payer: Self-pay | Admitting: Internal Medicine

## 2021-10-17 DIAGNOSIS — K8020 Calculus of gallbladder without cholecystitis without obstruction: Secondary | ICD-10-CM

## 2021-10-17 HISTORY — PX: CHOLECYSTECTOMY: SHX55

## 2021-10-17 LAB — RESP PANEL BY RT-PCR (FLU A&B, COVID) ARPGX2
Influenza A by PCR: NEGATIVE
Influenza B by PCR: NEGATIVE
SARS Coronavirus 2 by RT PCR: NEGATIVE

## 2021-10-17 SURGERY — LAPAROSCOPIC CHOLECYSTECTOMY WITH INTRAOPERATIVE CHOLANGIOGRAM
Anesthesia: General

## 2021-10-17 MED ORDER — SODIUM CHLORIDE 0.9 % IR SOLN
Status: DC | PRN
  Administered 2021-10-17: 1000 mL

## 2021-10-17 MED ORDER — CHLORHEXIDINE GLUCONATE 0.12 % MT SOLN
OROMUCOSAL | Status: AC
  Administered 2021-10-17: 15 mL via OROMUCOSAL
  Filled 2021-10-17: qty 15

## 2021-10-17 MED ORDER — DROPERIDOL 2.5 MG/ML IJ SOLN: 0.6250 mg | Freq: Once | INTRAMUSCULAR | Status: DC | PRN

## 2021-10-17 MED ORDER — KETOROLAC TROMETHAMINE 15 MG/ML IJ SOLN
15.0000 mg | Freq: Once | INTRAMUSCULAR | Status: AC | PRN
  Administered 2021-10-17: 15 mg via INTRAVENOUS
  Filled 2021-10-17: qty 1

## 2021-10-17 MED ORDER — ROCURONIUM BROMIDE 10 MG/ML (PF) SYRINGE
PREFILLED_SYRINGE | INTRAVENOUS | Status: AC
  Filled 2021-10-17: qty 10

## 2021-10-17 MED ORDER — ONDANSETRON HCL 4 MG/2ML IJ SOLN
4.0000 mg | Freq: Four times a day (QID) | INTRAMUSCULAR | Status: DC | PRN
  Administered 2021-10-17 – 2021-10-18 (×2): 4 mg via INTRAVENOUS
  Filled 2021-10-17 (×2): qty 2

## 2021-10-17 MED ORDER — LACTATED RINGERS IV SOLN: INTRAVENOUS | Status: DC

## 2021-10-17 MED ORDER — HYDRALAZINE HCL 20 MG/ML IJ SOLN: 10.0000 mg | INTRAMUSCULAR | Status: DC | PRN

## 2021-10-17 MED ORDER — CHLORHEXIDINE GLUCONATE CLOTH 2 % EX PADS: 6.0000 | MEDICATED_PAD | Freq: Once | CUTANEOUS | Status: DC

## 2021-10-17 MED ORDER — SUCCINYLCHOLINE CHLORIDE 200 MG/10ML IV SOSY
PREFILLED_SYRINGE | INTRAVENOUS | Status: DC | PRN
  Administered 2021-10-17: 60 mg via INTRAVENOUS

## 2021-10-17 MED ORDER — ORAL CARE MOUTH RINSE: 15.0000 mL | Freq: Once | OROMUCOSAL | Status: AC

## 2021-10-17 MED ORDER — ENOXAPARIN SODIUM 40 MG/0.4ML IJ SOSY
40.0000 mg | PREFILLED_SYRINGE | INTRAMUSCULAR | Status: DC
  Administered 2021-10-18: 40 mg via SUBCUTANEOUS
  Filled 2021-10-17: qty 0.4

## 2021-10-17 MED ORDER — 0.9 % SODIUM CHLORIDE (POUR BTL) OPTIME
TOPICAL | Status: DC | PRN
  Administered 2021-10-17: 1000 mL

## 2021-10-17 MED ORDER — ROCURONIUM BROMIDE 10 MG/ML (PF) SYRINGE
PREFILLED_SYRINGE | INTRAVENOUS | Status: DC | PRN
  Administered 2021-10-17: 50 mg via INTRAVENOUS
  Administered 2021-10-17: 10 mg via INTRAVENOUS

## 2021-10-17 MED ORDER — HYDROMORPHONE HCL 1 MG/ML IJ SOLN
0.2500 mg | INTRAMUSCULAR | Status: DC | PRN
  Administered 2021-10-17 (×2): 0.5 mg via INTRAVENOUS

## 2021-10-17 MED ORDER — DEXAMETHASONE SODIUM PHOSPHATE 10 MG/ML IJ SOLN
INTRAMUSCULAR | Status: AC
  Filled 2021-10-17: qty 1

## 2021-10-17 MED ORDER — LIDOCAINE 2% (20 MG/ML) 5 ML SYRINGE
INTRAMUSCULAR | Status: DC | PRN
  Administered 2021-10-17: 50 mg via INTRAVENOUS

## 2021-10-17 MED ORDER — ACETAMINOPHEN 325 MG PO TABS: 650.0000 mg | ORAL_TABLET | Freq: Four times a day (QID) | ORAL | Status: DC | PRN

## 2021-10-17 MED ORDER — BUPIVACAINE-EPINEPHRINE 0.25% -1:200000 IJ SOLN
INTRAMUSCULAR | Status: DC | PRN
  Administered 2021-10-17: 23 mL

## 2021-10-17 MED ORDER — HYDROMORPHONE HCL 1 MG/ML IJ SOLN
INTRAMUSCULAR | Status: AC
  Filled 2021-10-17: qty 1

## 2021-10-17 MED ORDER — GADOBUTROL 1 MMOL/ML IV SOLN
6.0000 mL | Freq: Once | INTRAVENOUS | Status: AC | PRN
  Administered 2021-10-17: 6 mL via INTRAVENOUS

## 2021-10-17 MED ORDER — OXYCODONE HCL 5 MG PO TABS
5.0000 mg | ORAL_TABLET | ORAL | Status: DC | PRN
  Administered 2021-10-17: 5 mg via ORAL
  Administered 2021-10-18: 10 mg via ORAL
  Filled 2021-10-17: qty 1
  Filled 2021-10-17: qty 2

## 2021-10-17 MED ORDER — MIDAZOLAM HCL 2 MG/2ML IJ SOLN
INTRAMUSCULAR | Status: AC
  Filled 2021-10-17: qty 2

## 2021-10-17 MED ORDER — KETOROLAC TROMETHAMINE 15 MG/ML IJ SOLN
15.0000 mg | Freq: Four times a day (QID) | INTRAMUSCULAR | Status: DC | PRN
  Administered 2021-10-17: 15 mg via INTRAVENOUS
  Filled 2021-10-17: qty 1

## 2021-10-17 MED ORDER — CLONIDINE HCL 0.1 MG PO TABS
0.1000 mg | ORAL_TABLET | Freq: Two times a day (BID) | ORAL | Status: DC
Start: 2021-10-17 — End: 2021-10-18
  Administered 2021-10-17 – 2021-10-18 (×3): 0.1 mg via ORAL
  Filled 2021-10-17 (×3): qty 1

## 2021-10-17 MED ORDER — LIDOCAINE 2% (20 MG/ML) 5 ML SYRINGE
INTRAMUSCULAR | Status: AC
  Filled 2021-10-17: qty 5

## 2021-10-17 MED ORDER — TOPIRAMATE 100 MG PO TABS
200.0000 mg | ORAL_TABLET | Freq: Two times a day (BID) | ORAL | Status: DC
  Administered 2021-10-17 – 2021-10-18 (×3): 200 mg via ORAL
  Filled 2021-10-17: qty 2
  Filled 2021-10-17: qty 8
  Filled 2021-10-17 (×2): qty 2

## 2021-10-17 MED ORDER — FENTANYL CITRATE (PF) 250 MCG/5ML IJ SOLN
INTRAMUSCULAR | Status: DC | PRN
  Administered 2021-10-17: 50 ug via INTRAVENOUS
  Administered 2021-10-17 (×2): 25 ug via INTRAVENOUS
  Administered 2021-10-17: 100 ug via INTRAVENOUS

## 2021-10-17 MED ORDER — ONDANSETRON HCL 4 MG/2ML IJ SOLN
INTRAMUSCULAR | Status: AC
  Filled 2021-10-17: qty 2

## 2021-10-17 MED ORDER — PROPOFOL 10 MG/ML IV BOLUS
INTRAVENOUS | Status: DC | PRN
  Administered 2021-10-17: 90 mg via INTRAVENOUS

## 2021-10-17 MED ORDER — BUPIVACAINE-EPINEPHRINE (PF) 0.25% -1:200000 IJ SOLN
INTRAMUSCULAR | Status: AC
  Filled 2021-10-17: qty 30

## 2021-10-17 MED ORDER — DOCUSATE SODIUM 100 MG PO CAPS
100.0000 mg | ORAL_CAPSULE | Freq: Every day | ORAL | Status: DC
  Administered 2021-10-17: 100 mg via ORAL
  Filled 2021-10-17 (×2): qty 1

## 2021-10-17 MED ORDER — CEFAZOLIN SODIUM-DEXTROSE 2-4 GM/100ML-% IV SOLN
2.0000 g | INTRAVENOUS | Status: AC
  Administered 2021-10-17: 2 g via INTRAVENOUS
  Filled 2021-10-17: qty 100

## 2021-10-17 MED ORDER — HYDROMORPHONE HCL 1 MG/ML IJ SOLN
0.5000 mg | INTRAMUSCULAR | Status: DC | PRN
  Administered 2021-10-17 – 2021-10-18 (×3): 0.5 mg via INTRAVENOUS
  Filled 2021-10-17 (×3): qty 0.5

## 2021-10-17 MED ORDER — CHLORHEXIDINE GLUCONATE 0.12 % MT SOLN: 15.0000 mL | Freq: Once | OROMUCOSAL | Status: AC

## 2021-10-17 MED ORDER — MIDAZOLAM HCL 5 MG/5ML IJ SOLN
INTRAMUSCULAR | Status: DC | PRN
  Administered 2021-10-17: 1 mg via INTRAVENOUS

## 2021-10-17 MED ORDER — GABAPENTIN 300 MG PO CAPS
300.0000 mg | ORAL_CAPSULE | ORAL | Status: AC
  Administered 2021-10-17: 300 mg via ORAL
  Filled 2021-10-17: qty 1

## 2021-10-17 MED ORDER — ACETAMINOPHEN 650 MG RE SUPP: 650.0000 mg | Freq: Four times a day (QID) | RECTAL | Status: DC | PRN

## 2021-10-17 MED ORDER — ONDANSETRON HCL 4 MG/2ML IJ SOLN
INTRAMUSCULAR | Status: DC | PRN
Start: 2021-10-17 — End: 2021-10-17
  Administered 2021-10-17: 4 mg via INTRAVENOUS

## 2021-10-17 MED ORDER — ONDANSETRON 4 MG PO TBDP: 4.0000 mg | ORAL_TABLET | Freq: Four times a day (QID) | ORAL | Status: DC | PRN

## 2021-10-17 MED ORDER — SODIUM CHLORIDE 0.9 % IV SOLN
INTRAVENOUS | Status: DC | PRN
  Administered 2021-10-17: 40 mL

## 2021-10-17 MED ORDER — ACETAMINOPHEN 500 MG PO TABS
1000.0000 mg | ORAL_TABLET | ORAL | Status: AC
  Administered 2021-10-17: 1000 mg via ORAL
  Filled 2021-10-17: qty 2

## 2021-10-17 MED ORDER — PHENYLEPHRINE 40 MCG/ML (10ML) SYRINGE FOR IV PUSH (FOR BLOOD PRESSURE SUPPORT)
PREFILLED_SYRINGE | INTRAVENOUS | Status: AC
  Filled 2021-10-17: qty 10

## 2021-10-17 MED ORDER — FENTANYL CITRATE (PF) 250 MCG/5ML IJ SOLN
INTRAMUSCULAR | Status: AC
  Filled 2021-10-17: qty 5

## 2021-10-17 MED ORDER — SUVOREXANT 20 MG PO TABS: 20.0000 mg | ORAL_TABLET | Freq: Every day | ORAL | Status: DC

## 2021-10-17 MED ORDER — SUGAMMADEX SODIUM 200 MG/2ML IV SOLN
INTRAVENOUS | Status: DC | PRN
  Administered 2021-10-17: 200 mg via INTRAVENOUS

## 2021-10-17 MED ORDER — IPRATROPIUM BROMIDE 0.06 % NA SOLN
2.0000 | Freq: Every day | NASAL | Status: DC
  Administered 2021-10-18: 2 via NASAL
  Filled 2021-10-17: qty 15

## 2021-10-17 SURGICAL SUPPLY — 46 items
APPLIER CLIP 5 13 M/L LIGAMAX5 (MISCELLANEOUS) ×2
BAG COUNTER SPONGE SURGICOUNT (BAG) ×2 IMPLANT
CANISTER SUCT 3000ML PPV (MISCELLANEOUS) ×2 IMPLANT
CHLORAPREP W/TINT 26 (MISCELLANEOUS) ×2 IMPLANT
CLIP APPLIE 5 13 M/L LIGAMAX5 (MISCELLANEOUS) ×1 IMPLANT
COVER MAYO STAND STRL (DRAPES) ×2 IMPLANT
COVER SURGICAL LIGHT HANDLE (MISCELLANEOUS) ×2 IMPLANT
DERMABOND ADVANCED (GAUZE/BANDAGES/DRESSINGS) ×1
DERMABOND ADVANCED .7 DNX12 (GAUZE/BANDAGES/DRESSINGS) ×1 IMPLANT
DRAPE C-ARM 42X120 X-RAY (DRAPES) ×2 IMPLANT
ELECT REM PT RETURN 9FT ADLT (ELECTROSURGICAL) ×2
ELECTRODE REM PT RTRN 9FT ADLT (ELECTROSURGICAL) ×1 IMPLANT
ENDOLOOP SUT PDS II  0 18 (SUTURE) ×2
ENDOLOOP SUT PDS II 0 18 (SUTURE) IMPLANT
GLOVE BIOGEL PI IND STRL 6 (GLOVE) ×1 IMPLANT
GLOVE BIOGEL PI INDICATOR 6 (GLOVE) ×1
GLOVE BIOGEL PI MICRO 5.5 (GLOVE) ×1
GLOVE BIOGEL PI MICRO STRL 5.5 (GLOVE) ×1 IMPLANT
GOWN STRL REUS W/ TWL LRG LVL3 (GOWN DISPOSABLE) ×3 IMPLANT
GOWN STRL REUS W/TWL LRG LVL3 (GOWN DISPOSABLE) ×3
KIT BASIN OR (CUSTOM PROCEDURE TRAY) ×2 IMPLANT
KIT TURNOVER KIT B (KITS) ×2 IMPLANT
L-HOOK LAP DISP 36CM (ELECTROSURGICAL) ×2
LHOOK LAP DISP 36CM (ELECTROSURGICAL) ×1 IMPLANT
NDL INSUFFLATION 14GA 120MM (NEEDLE) IMPLANT
NEEDLE INSUFFLATION 14GA 120MM (NEEDLE) IMPLANT
NS IRRIG 1000ML POUR BTL (IV SOLUTION) ×2 IMPLANT
PAD ARMBOARD 7.5X6 YLW CONV (MISCELLANEOUS) ×2 IMPLANT
PENCIL BUTTON HOLSTER BLD 10FT (ELECTRODE) ×2 IMPLANT
POUCH SPECIMEN RETRIEVAL 10MM (ENDOMECHANICALS) ×2 IMPLANT
SCISSORS LAP 5X35 DISP (ENDOMECHANICALS) ×2 IMPLANT
SET CHOLANGIOGRAPH 5 50 .035 (SET/KITS/TRAYS/PACK) ×2 IMPLANT
SET IRRIG TUBING LAPAROSCOPIC (IRRIGATION / IRRIGATOR) ×2 IMPLANT
SET TUBE SMOKE EVAC HIGH FLOW (TUBING) ×2 IMPLANT
SLEEVE ENDOPATH XCEL 5M (ENDOMECHANICALS) ×4 IMPLANT
SPECIMEN JAR SMALL (MISCELLANEOUS) ×2 IMPLANT
SUT MNCRL AB 4-0 PS2 18 (SUTURE) ×3 IMPLANT
SUT PDS 2 0 (SUTURE) IMPLANT
SUT VICRYL 0 UR6 27IN ABS (SUTURE) ×2 IMPLANT
TOWEL GREEN STERILE (TOWEL DISPOSABLE) ×2 IMPLANT
TOWEL GREEN STERILE FF (TOWEL DISPOSABLE) ×2 IMPLANT
TRAY LAPAROSCOPIC MC (CUSTOM PROCEDURE TRAY) ×2 IMPLANT
TROCAR XCEL 12X100 BLDLESS (ENDOMECHANICALS) IMPLANT
TROCAR XCEL BLUNT TIP 100MML (ENDOMECHANICALS) ×2 IMPLANT
TROCAR XCEL NON-BLD 5MMX100MML (ENDOMECHANICALS) ×2 IMPLANT
WATER STERILE IRR 1000ML POUR (IV SOLUTION) ×2 IMPLANT

## 2021-10-17 NOTE — Transfer of Care (Signed)
Immediate Anesthesia Transfer of Care Note ? ?Patient: Kelli Navarro ? ?Procedure(s) Performed: LAPAROSCOPIC CHOLECYSTECTOMY WITH INTRAOPERATIVE CHOLANGIOGRAM ? ?Patient Location: PACU ? ?Anesthesia Type:General ? ?Level of Consciousness: awake and drowsy ? ?Airway & Oxygen Therapy: Patient Spontanous Breathing ? ?Post-op Assessment: Report given to RN and Post -op Vital signs reviewed and stable ? ?Post vital signs: Reviewed and stable ? ?Last Vitals:  ?Vitals Value Taken Time  ?BP 135/71 10/17/21 1557  ?Temp    ?Pulse 84 10/17/21 1558  ?Resp 20 10/17/21 1558  ?SpO2 96 % 10/17/21 1558  ?Vitals shown include unvalidated device data. ? ?Last Pain:  ?Vitals:  ? 10/17/21 1404  ?TempSrc: Oral  ?PainSc:   ?   ? ?  ? ?Complications: No notable events documented. ?

## 2021-10-17 NOTE — Anesthesia Preprocedure Evaluation (Signed)
Anesthesia Evaluation  ?Patient identified by MRN, date of birth, ID band ?Patient awake ? ? ? ?Reviewed: ?Allergy & Precautions, NPO status , Patient's Chart, lab work & pertinent test results ? ?History of Anesthesia Complications ?Negative for: history of anesthetic complications ? ?Airway ?Mallampati: I ? ?TM Distance: >3 FB ?Neck ROM: Full ? ? ? Dental ? ?(+) Partial Lower, Dental Advisory Given, Teeth Intact ?  ?Pulmonary ?neg pulmonary ROS, former smoker,  ?  ?Pulmonary exam normal ?breath sounds clear to auscultation ? ? ? ? ? ? Cardiovascular ?hypertension, Normal cardiovascular exam ?Rhythm:Regular Rate:Normal ? ? ?  ?Neuro/Psych ?Seizures -, Well Controlled,  PSYCHIATRIC DISORDERS Anxiety Depression   ? GI/Hepatic ?negative GI ROS, Neg liver ROS,   ?Endo/Other  ?negative endocrine ROS ? Renal/GU ?negative Renal ROS  ?negative genitourinary ?  ?Musculoskeletal ?negative musculoskeletal ROS ?(+)  ? Abdominal ?  ?Peds ? Hematology ?negative hematology ROS ?(+)   ?Anesthesia Other Findings ? ? Reproductive/Obstetrics ? ?  ? ? ? ? ? ? ? ? ? ? ? ? ? ?  ?  ? ? ? ? ? ? ? ?Anesthesia Physical ?Anesthesia Plan ? ?ASA: 2 ? ?Anesthesia Plan: General  ? ?Post-op Pain Management: Tylenol PO (pre-op)* and Gabapentin PO (pre-op)*  ? ?Induction: Intravenous ? ?PONV Risk Score and Plan: 4 or greater and Ondansetron, Dexamethasone, Treatment may vary due to age or medical condition and Midazolam ? ?Airway Management Planned: Oral ETT ? ?Additional Equipment:  ? ?Intra-op Plan:  ? ?Post-operative Plan: Extubation in OR ? ?Informed Consent: I have reviewed the patients History and Physical, chart, labs and discussed the procedure including the risks, benefits and alternatives for the proposed anesthesia with the patient or authorized representative who has indicated his/her understanding and acceptance.  ? ? ? ?Dental advisory given ? ?Plan Discussed with: CRNA ? ?Anesthesia Plan Comments:    ? ? ? ? ? ? ?Anesthesia Quick Evaluation ? ?

## 2021-10-17 NOTE — Anesthesia Procedure Notes (Signed)
Procedure Name: Intubation ?Date/Time: 10/17/2021 2:49 PM ?Performed by: Dorann Lodge, CRNA ?Pre-anesthesia Checklist: Patient identified, Emergency Drugs available, Suction available and Patient being monitored ?Patient Re-evaluated:Patient Re-evaluated prior to induction ?Oxygen Delivery Method: Circle System Utilized ?Preoxygenation: Pre-oxygenation with 100% oxygen ?Induction Type: IV induction, Rapid sequence and Cricoid Pressure applied ?Laryngoscope Size: Mac and 3 ?Grade View: Grade I ?Tube type: Oral ?Tube size: 7.0 mm ?Number of attempts: 1 ?Airway Equipment and Method: Stylet ?Placement Confirmation: ETT inserted through vocal cords under direct vision, positive ETCO2 and breath sounds checked- equal and bilateral ?Secured at: 22 cm ?Tube secured with: Tape ?Dental Injury: Teeth and Oropharynx as per pre-operative assessment  ? ? ? ? ?

## 2021-10-17 NOTE — Discharge Instructions (Addendum)
[facility] ______[facility] ?LAPAROSCOPIC SURGERY: POST OP INSTRUCTIONS ?Always review your discharge instruction sheet given to you by the facility where your surgery was performed. ?IF YOU HAVE DISABILITY OR FAMILY LEAVE FORMS, YOU MUST BRING THEM TO THE OFFICE FOR PROCESSING.   ?DO NOT GIVE THEM TO YOUR DOCTOR. ? ?A prescription for pain medication may be given to you upon discharge.  Take your pain medication as prescribed, if needed.  If narcotic pain medicine is not needed, then you may take acetaminophen (Tylenol) or ibuprofen (Advil) as needed. ?Take your usually prescribed medications unless otherwise directed. ?If you need a refill on your pain medication, please contact your pharmacy.  They will contact our office to request authorization. Prescriptions will not be filled after 5pm or on week-ends. ?You should follow a light diet the first few days after arrival home, such as soup and crackers, etc.  Be sure to include lots of fluids daily. ?Most patients will experience some swelling and bruising in the area of the incisions.  Ice packs will help.  Swelling and bruising can take several days to resolve.  ?It is common to experience some constipation if taking pain medication after surgery.  Increasing fluid intake and taking a stool softener (such as Colace) will usually help or prevent this problem from occurring.  A mild laxative (Milk of Magnesia or Miralax) should be taken according to package instructions if there are no bowel movements after 48 hours. ?Unless discharge instructions indicate otherwise, you may remove your bandages 24-48 hours after surgery, and you may shower at that time.  You may have steri-strips (small skin tapes) in place directly over the incision.  These strips should be left on the skin for 7-10 days.  If your surgeon used skin glue on the incision, you may shower in 24 hours.  The glue will flake off over the next 2-3 weeks.  Any sutures or staples will be  removed at the office during your follow-up visit. ?ACTIVITIES:  You may resume regular (light) daily activities beginning the next day--such as daily self-care, walking, climbing stairs--gradually increasing activities as tolerated.  You may have sexual intercourse when it is comfortable.  Refrain from any heavy lifting or straining until approved by your doctor. ?You may drive when you are no longer taking prescription pain medication, you can comfortably wear a seatbelt, and you can safely maneuver your car and apply brakes. ?RETURN TO WORK:  __________________________________________________________ ?You should see your doctor in the office for a follow-up appointment approximately 2-3 weeks after your surgery.  Make sure that you call for this appointment within a day or two after you arrive home to insure a convenient appointment time. ?OTHER INSTRUCTIONS: __________________________________________________________________________________________________________________________ __________________________________________________________________________________________________________________________ ?WHEN TO CALL YOUR DOCTOR: ?Fever over 101.0 ?Inability to urinate ?Continued bleeding from incision. ?Increased pain, redness, or drainage from the incision. ?Increasing abdominal pain ? ?The clinic staff is available to answer your questions during regular business hours.  Please don?t hesitate to call and ask to speak to one of the nurses for clinical concerns.  If you have a medical emergency, go to the nearest emergency room or call 911.  A surgeon from [facility] is always on call at the hospital. ?[address] [place], [place], [place]  [address] ? [address], [place], [place]   [address] ?[phone] ? [phone] ? FAX [phone] ?Web site: [email or web address] ? ? ? ?Medicare Outpatient Observation Notice ?  ?Patient name:  Kelli Navarro Patient  number:   [id number]  ?                                                                                                                                                                     ?You?re a hospital outpatient receiving observation services. You are not an inpatient because: ? ?  ?Laprascopic Choleycystectomy ?  ?You require hospital care for evaluation and/or treatment.  It is expected you will need hospital care for less than a total of two days.  ?                                                                                                                                                                     ?  ?Being an outpatient may affect what you pay in a hospital: ?  ?When you?re a hospital outpatient, your observation stay is covered under Medicare Part B. ?  ?For Part B services, you generally pay: ?  ?A copayment for each outpatient hospital service you get. Part B copayments may vary by type of service. ?  ?20% of the Medicare-approved amount for most doctor services, after the Part B deductible. ?  ?Observation services may affect coverage and payment of your care after you leave the hospital: ? ?  ? ?If you need skilled nursing facility (SNF) care after you leave the hospital, Medicare Part A will only cover SNF care if you?ve had a 3-day minimum, medically necessary, inpatient hospital stay for a related illness or injury. An inpatient hospital stay begins the day the hospital admits you as an inpatient based on a doctor?s order and doesn?t include the day you?re discharged. ?  ?If you have Medicaid, a Medicare Advantage plan or other health plan, Medicaid or the plan may have different rules for SNF coverage after you leave the hospital. Check with Medicaid or your plan. ?  ?NOTE: Medicare Part A generally doesn?t cover outpatient hospital services, like an observation stay. However, Part A will generally cover medically necessary  inpatient services if the hospital admits you as an inpatient based on  a doctor?s order. In most cases, you?ll pay a one-time deductible for all of your inpatient hospital services for the first 60 days you?re in a hospital. ?                                                                                                                                                                     ?If you have any questions about your observation services, ask the hospital staff member giving you this notice or the doctor providing your hospital care. You can also ask to speak with someone from the hospital?s utilization or discharge planning department. ?  ?You can also call 1-800-MEDICARE ([phone] 069-4553).  TT[employer] users should call [phone]. ?  ?Form CMS [id number]   Expiration 07/06/2021 OMB APPROVAL [id number]  ?  ?  ? ?  ? ?Your costs for medications: ? ?  ? ?Generally, prescription and over-the-counter drugs, including ?self-administered drugs,? you get in a hospital outpatient setting (like an emergency department) aren?t covered by Part B. ?Self- administered drugs? are drugs you?d normally take on your own. For safety reasons, many hospitals don?t allow you to take medications brought from home. If you have a Medicare prescription drug plan (Part D), your plan may help you pay for these drugs. You?ll likely need to pay out-of- pocket for these drugs and submit a claim to your drug plan for a refund. Contact your drug plan for more information. ?  ?                                                                                                                                                                     ?If you?re enrolled in a Medicare Advantage plan (like an HMO or PPO) or other Medicare health plan (Part C), your costs and coverage may be different. Check with your plan to find out about coverage for outpatient observation services. ? ?  ?If you?re a Personnel officer  Beneficiary through your state Medicaid program, you can?t be billed for Part A or Part B deductibles,  coinsurance, and copayments. ? ?                                                                                                                                                                    ?Additional [employer]

## 2021-10-17 NOTE — ED Notes (Signed)
Pt remains in MRI 

## 2021-10-17 NOTE — Care Management Obs Status (Signed)
MEDICARE OBSERVATION STATUS NOTIFICATION ? ? ?Patient Details  ?Name: Kelli Navarro ?MRN: [id number] ?Date of Birth: 1950-10-07 ? ? ?Medicare Observation Status Notification Given:  Yes ? ?Given permission to sign by wife over phone due to remote. Copy placed on DC instructions for print out ? ?Verdell Carmine, RN ?10/17/2021, 5:36 PM ?

## 2021-10-17 NOTE — TOC Initial Note (Signed)
Transition of Care (TOC) - Initial/Assessment Note  ? ? ?Patient Details  ?Name: CAMMIE FAULSTICH ?MRN: [id number] ?Date of Birth: October 08, 1950 ? ?Transition of Care (TOC) CM/SW Contact:    ?Verdell Carmine, RN ?Phone Number: ?10/17/2021, 4:50 PM ? ?Clinical Narrative:                 ? ?The Transition of Care Department [facility]) has reviewed patient and no TOC needs have been identified at this time. We will continue to monitor patient advancement through interdisciplinary progression rounds. If new patient transition needs arise, please place a TOC consult ? ?  ?  ? ? ?Patient Goals and CMS Choice ?  ?  ?  ? ?Expected Discharge Plan and Services ?  ?  ?  ?  ?  ?                ?  ?  ?  ?  ?  ?  ?  ?  ?  ?  ? ?Prior Living Arrangements/Services ?  ?  ?  ?       ?  ?  ?  ?  ? ?Activities of Daily Living ?Home Assistive Devices/Equipment: None ?ADL Screening (condition at time of admission) ?Patient's cognitive ability adequate to safely complete daily activities?: Yes ?Is the patient deaf or have difficulty hearing?: No ?Does the patient have difficulty seeing, even when wearing glasses/contacts?: No ?Does the patient have difficulty concentrating, remembering, or making decisions?: No ?Patient able to express need for assistance with ADLs?: Yes ?Does the patient have difficulty dressing or bathing?: No ?Independently performs ADLs?: Yes (appropriate for developmental age) ?Does the patient have difficulty walking or climbing stairs?: No ?Weakness of Legs: None ?Weakness of Arms/Hands: None ? ?Permission Sought/Granted ?  ?  ?   ?   ?   ?   ? ?Emotional Assessment ?  ?  ?  ?  ?  ?  ? ?Admission diagnosis:  Calculus of gallbladder without cholecystitis without obstruction [K80.20] ?Symptomatic cholelithiasis [K80.20] ?Patient Active Problem List  ? Diagnosis Date Noted  ? Symptomatic cholelithiasis 10/17/2021  ? Suicide attempt by drug overdose ([place]) 09/11/2020  ? Severe episode of recurrent major depressive disorder,  without psychotic features ([place])   ? Generalized anxiety disorder   ? Obtundation   ? Overdose 09/08/2020  ? Fracture of femoral neck, right ([place]) 08/16/2020  ? Displaced fracture of right femoral neck ([place]) 08/16/2020  ? AMS (altered mental status) 04/07/2020  ? ADHD   ? Fall   ? Mood disorder ([place]) 05/25/2018  ? Insomnia 05/25/2018  ? Seizure disorder ([place])   ? Displaced fracture of left femoral neck ([place]) 03/22/2017  ? Left displaced femoral neck fracture ([place]) 03/21/2017  ? Fracture of 5th metatarsal 03/21/2017  ? Hypokalemia 03/21/2017  ? Femur fracture ([place]) 03/21/2017  ? Foot drop 03/21/2017  ? Hypertension   ? Chronic back pain   ? Adjustment disorder with disturbance of emotion 01/01/2017  ? Seizure-like activity ([place]) 10/17/2012  ? ?PCP:  Rubie Maid, MD ?Pharmacy:   ?[id number] [place] [place]?Phone: [phone]: [phone]? ? ? ? ?Social Determinants of Health (SDOH) Interventions ?  ? ?Readmission Risk Interventions ?   ? View : No data to display.  ?  ?  ?  ? ? ? ?

## 2021-10-17 NOTE — H&P (Signed)
?History and Physical  ? ? ?Patient: Kelli Navarro OJJ:[id number] DOB: 1951/05/23 ?DOA: 10/16/2021 ?DOS: the patient was seen and examined on 10/17/2021 ?PCP: Rubie Maid, MD  ?Patient coming from: Home - lives with wife; NOK: Wife, [phone] ? ? ?Chief Complaint: Abdominal pain ? ?HPI: Kelli Navarro is a 71 y.o. female with medical history significant of ADHD/anxiety/depression and chronic back pain presenting with abdominal pain.   She reports that on Good Friday, she developed abdominal pain.  It was "a little bit to the left".  Every time she ate it got worst.  It persisted Friday and Saturday and then she stopped eating so the pain persisted but didn't get worse.  She saw her PCP Tuesday and she ordered a CT and blood work and said if it got worse she should come in.  She was referred to an outpatient surgeon but if it got worse she should come in - and it got worse.  On Friday after eating, she had diarrhea, very watery, continuing hourly x 5 (usually has normal BMs).  Similar diarrhea Saturday.  She didn't eat Sunday so it slowed down and she hasn't been eating or having stools since.  She has had nausea, started vomiting in the ER. No blood.  ? ? ? ?ER Course:  Carryover, per Dr. Marlowe Sax: ? ?Admission for symptomatic cholelithiasis.  Surgery will consult (Dr. Windle Guard) and also GI will consult (Dr. Lyndel Safe).  Potassium low and replaced in the ED. ? ? ? ? ?Review of Systems: As mentioned in the history of present illness. All other systems reviewed and are negative. ?Past Medical History:  ?Diagnosis Date  ? ADHD   ? Anxiety   ? Chronic back pain   ? from broken back  ? Depression   ? Right femoral fracture ([place])   ? [facility] spotted fever   ? may show low Hgb  ? Seizures ([place])   ? has during sleep  ? Vitamin B 12 deficiency   ? ?Past Surgical History:  ?Procedure Laterality Date  ? BACK SURGERY    ? none    ? radial frequency ablasion  05/2019  ? back  ? 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 Procedure: TOTAL HIP ARTHROPLASTY ANTERIOR APPROACH;  Surgeon: Rod Can, MD;  Location: [facility];  Service: Orthopedics;  Laterality: Left;  ? TOTAL HIP ARTHROPLASTY Right 08/16/2020  ? Procedure: TOTAL HIP ARTHROPLASTY ANTERIOR APPROACH;  Surgeon: Rod Can, MD;  Location: [facility];  Service: Orthopedics;  Laterality: Right;  ? ?Social History:  reports that she has quit smoking. Her smoking use included cigarettes. She has a 0.50 pack-year smoking history. She has never used smokeless tobacco. She reports that she does not drink alcohol and does not use drugs. ? ?Allergies  ?Allergen Reactions  ? Other Anaphylaxis  ?  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 Aptiom [Eslicarbazepine] Other (See Comments)  ?  Unknown reaction  ? Azithromycin Other (See Comments)  ?  Unknown reaction ?  ? Bupropion Other (See Comments)  ?   Caused depression  ? Carbamazepine Other (See Comments)  ?  Unknown reaction ?  ? Dilantin [Phenytoin Sodium Extended] Other (See Comments)  ?  Unknown reaction ?  ? Diphenhydramine Other (See Comments)  ?  Feels like "spiders are crawling" on her  ? Divalproex Sodium Other (See Comments)  ?  Unknown reaction ?  ? Keppra [Levetiracetam] Other (See Comments)  ?  Unknown reaction ?  ?  Lamictal [Lamotrigine] Other (See Comments)  ?  Unknown reaction ?  ? Lorazepam Swelling and Other (See Comments)  ?  Hallucinations  ? Lyrica [Pregabalin] Other (See Comments)  ?  Pt reports feeling loopy and confused  ? Morphine And Related Other (See Comments)  ?  Unknown reaction - (high doses)  ? Prednisone Other (See Comments)  ?  Unknown reaction ?  ? Vimpat [Lacosamide] Other (See Comments)  ?  Unknown reaction ?  ? ? ?Family History  ?Problem Relation Age of Onset  ? Multiple sclerosis Mother   ? Heart Problems Mother   ?     by-pass  ? Alcohol abuse Mother   ? Heart Problems Sister   ?     pacemaker  ? ? ?Prior to Admission medications   ?Medication Sig Start Date End Date Taking?  Authorizing Provider  ?acetaminophen (TYLENOL) 500 MG tablet Take 1,000 mg by mouth every 6 (six) hours as needed for moderate pain or headache (pain).   Yes [provider]  ?amphetamine-dextroamphetamine (ADDERALL) 20 MG tablet Take 1 tablet (20 mg total) by mouth 2 (two) times daily. May take an additional tablet as needed 09/18/21  Yes Thayer Headings, PMHNP  ?cloNIDine (CATAPRES) 0.1 MG tablet Take 1 tablet (0.1 mg total) by mouth 2 (two) times daily. 09/05/21  Yes Thayer Headings, [place]  ?[facility] ASPIRIN ADULT LOW DOSE 81 MG chewable tablet Chew 81 mg by mouth daily. 05/09/21  Yes [provider]  ?ipratropium (ATROVENT) 0.06 % nasal spray Place 2 sprays into both nostrils daily. 10/11/19  Yes [provider]  ?Multiple Vitamin (MULTIVITAMIN WITH MINERALS) TABS tablet Take 1 tablet by mouth daily.   Yes [provider]  ?Suvorexant (BELSOMRA) 20 MG TABS Take 20 mg by mouth at bedtime. 08/14/21 10/17/21 Yes Thayer Headings, [place]  ?topiramate (TOPAMAX) 100 MG tablet Take 2 tabs twice a day ?Patient taking differently: Take 200 mg by mouth 2 (two) times daily. 02/15/21  Yes Cameron Sprang, MD  ?traMADol (ULTRAM-ER) 100 MG 24 hr tablet Take 100 mg by mouth daily. 09/16/21  Yes [provider]  ?hydrOXYzine (VISTARIL) 25 MG capsule Take 1 capsule (25 mg total) by mouth at bedtime as needed (Insomnia). ?Patient not taking: Reported on 10/17/2021 09/05/21   Thayer Headings, PMHNP  ?traMADol (ULTRAM-ER) 200 MG 24 hr tablet Take 200 mg by mouth daily as needed. 10/15/21   [provider]  ? ? ?Physical Exam: ?Vitals:  ? 10/17/21 0453 10/17/21 0530 10/17/21 0715 10/17/21 1027  ?BP: (!) 129/92 134/77 130/88 127/80  ?Pulse: 79 80 93 94  ?Resp: 18 (!) '22 18 17  '$ ?Temp:      ?TempSrc:      ?SpO2: 98% 99% 99%   ? ?General:  Appears calm and comfortable and is in NAD ?Eyes:   EOMI, normal lids, iris ?ENT:  grossly normal hearing, lips & tongue, mmm ?Neck:  no LAD, masses or  thyromegaly ?Cardiovascular:  RRR, no m/r/g. No LE edema.  ?Respiratory:   CTA bilaterally with no wheezes/rales/rhonchi.  Normal respiratory effort. ?Abdomen:  soft, TTP in RUQ and midepigastric regions ?Skin:  no rash or induration seen on limited exam ?Musculoskeletal:  grossly normal tone BUE/BLE, good ROM, no bony abnormality ?Psychiatric:  grossly normal mood and affect, speech fluent and appropriate, AOx3 ?Neurologic:  CN 2-12 grossly intact, moves all extremities in coordinated fashion ? ? ?Radiological Exams on Admission: ?Independently reviewed - see discussion in A/P where applicable ? ?US Abdomen  Limited ? ?Result Date: 10/16/2021 ?CLINICAL DATA:  abd pain EXAM: ULTRASOUND ABDOMEN LIMITED RIGHT UPPER QUADRANT COMPARISON:  None. FINDINGS: Gallbladder: Calcified gallstone within the gallbladder lumen measuring up to 14 mm. No gallbladder wall thickening or pericholecystic fluid visualized. No sonographic Murphy sign noted by sonographer. Common bile duct: Diameter: 8 mm. Liver: Limited evaluation of the left hepatic lobe due to bowel gas. No focal lesion identified. Within normal limits in parenchymal echogenicity. Portal vein is patent on color Doppler imaging with normal direction of blood flow towards the liver. Other: None. IMPRESSION: 1. Cholelithiasis with no acute cholecystitis. 2. Limited evaluation of the left hepatic lobe due to bowel gas. Electronically Signed   By: Iven Finn M.D.   On: 10/16/2021 16:25  ? ?MR ABDOMEN MRCP W WO CONTAST ? ?Result Date: 10/17/2021 ?CLINICAL DATA:  Epigastric pain EXAM: MRI ABDOMEN WITHOUT AND WITH CONTRAST (INCLUDING MRCP) TECHNIQUE: Multiplanar multisequence MR imaging of the abdomen was performed both before and after the administration of intravenous contrast. Heavily T2-weighted images of the biliary and pancreatic ducts were obtained, and three-dimensional MRCP images were rendered by post processing. CONTRAST:  38m GADAVIST GADOBUTROL 1 MMOL/ML IV SOLN  COMPARISON:  Abdominal ultrasound 10/16/2021 FINDINGS: Study is limited due to motion. Lower chest: No acute findings. Hepatobiliary: Liver is normal in size and contour. No evidence of hepatic steatosis. No suspicious hepatic

## 2021-10-17 NOTE — Op Note (Signed)
Date: 10/17/21 ? ?Patient: Kelli Navarro ?MRN: [id number] ? ?Preoperative Diagnosis: Cholelithiasis, possible choledocholithiasis ?Postoperative Diagnosis: Cholelithiasis ? ?Procedure: Laparoscopic cholecystectomy ? ?Surgeon: Michaelle Birks, MD ? ?EBL: Minimal ? ?Anesthesia: General endotracheal ? ?Specimens: Gallbladder ? ?Indications: Kelli Navarro is a 71 yo female who presented to the ED with abdominal pain for the last few days, which was worse after eating. It was primarily in the epigastric area with radiation to the left side of her abdomen. An outside CT showed mild CBD dilation. On admission at [facility], LFTs were normal and ultrasound showed cholelithiasis without evidence of acute cholecystitis. An MRCP was performed and showed concern for a filling defect in the distal common bile duct. As it was nonobstructive, after discussion with GI the decision was made to proceed with cholecystectomy with intraoperative cholangiogram to further evaluate. After an extensive discussion of the risks and benefits of surgery, the patient agreed to proceed. ? ?Findings: Cholelithiasis without signs of cholecystitis. No filling defects within the biliary tree on intraoperative cholangiogram. ? ?Procedure details: Informed consent was obtained in the preoperative area prior to the procedure. The patient was brought to the operating room and placed on the table in the supine position. General anesthesia was induced and appropriate lines and drains were placed for intraoperative monitoring. Perioperative antibiotics were administered per SCIP guidelines. The abdomen was prepped and draped in the usual sterile fashion. A pre-procedure timeout was taken verifying patient identity, surgical site and procedure to be performed. ? ?A small infraumbilical skin incision was made, the subcutaneous tissue was divided with cautery, and the umbilical stalk was grasped and elevated. The fascia was incised and the peritoneal cavity  was directly visualized. A 14m Hassan trocar was placed and the abdomen was insufflated. The peritoneal cavity was inspected with no evidence of visceral or vascular injury. Three 544mports were placed in the right subcostal margin, all under direct visualization. The fundus of the gallbladder was grasped and retracted cephalad. The infundibulum was retracted laterally. The cystic triangle was dissected out using cautery and blunt dissection, and the critical view of safety was obtained. The cystic artery was clipped and ligated. The patient had very little visceral fat and the entire common bile duct up to the bifurcation into the right and left hepatic ducts was clearly visible. The cystic duct appeared to be very long, and was clipped close to the gallbladder. A ductotomy was made on the cystic duct just distal to the clip, and a cholangiocatheter was passed through the abdominal wall and used to cannulate the cystic duct. A cholangiogram was performed under fluoroscopy. On initial cholangiogram, there was significant leakage of contrast out of the cystic duct. The distal common bile duct filled but there was no filling of the duodenum. The catheter was repositioned and secured with a clip, and a repeat cholangiogram was performed. There was filling of the common bile duct and contrast promptly entered the duodenum. There was only minimal reflux of contrast into the common hepatic duct. There were no filling defects in the common bile duct. The cholangiocatheter was removed, and the cystic duct stump was closed with a PDS endoloop. The porta hepatis was examined and the common bile duct, common hepatic duct and right and left hepatic ducts were visible, with a long cystic duct stump remaining. The gallbladder was taken off the liver using cautery. The specimen was placed in an endocatch bag and removed. The surgical site was irrigated with saline until the effluent was clear. Hemostasis  was achieved in the  gallbladder fossa using cautery. The cystic duct and artery stumps were visually inspected and there was no evidence of bile leak or bleeding. The ports were removed under direct visualization and the abdomen was desufflated. The umbilical port site fascia was closed with a 0 vicryl suture. The skin at all port sites was closed with 4-0 monocryl subcuticular suture. Dermabond was applied. ? ?The patient tolerated the procedure well with no apparent complications. All counts were correct x2 at the end of the procedure. The patient was extubated and taken to PACU in stable condition. ? ?Michaelle Birks, MD ?10/17/21 ?3:52 PM ? ? ?

## 2021-10-17 NOTE — Progress Notes (Signed)
MRCP reviewed. Shows likely small filling defect in distal CBD, nonobstructive given normal LFTs. Discussed with GI. Will proceed with lap chole with IOC, and if positive IOC, patient will need an ERCP. I reviewed with the patient that since we have no other clear source for her pain, I recommend lap chole to prevent recurrent choledocholithiasis and associated complications such as biliary pancreatitis and cholangitis. Patient has been NPO all day, and I have reviewed the details of cholecystectomy with her and her wife at bedside. I discussed the risks, including bleeding, infection, post-cholecystectomy diarrhea, and <0.5% risk of common bile duct injury. She expressed understanding and agrees to proceed. ? ?Michaelle Birks, MD ?[facility] ?General, Hepatobiliary and Pancreatic Surgery ?10/17/21 1:29 PM ? ?

## 2021-10-17 NOTE — ED Notes (Signed)
Patient transported to MRI 

## 2021-10-17 NOTE — Progress Notes (Signed)
Patient arrived to [place] room 14 alert and orientated x4. Pain level 4/10. 4 lap sites with skin glue all clean,dry and intact. Bed in lowest position. Call light in reach. Will continue to monitor.  ?

## 2021-10-17 NOTE — Consult Note (Signed)
? ? ? ? ?Consult Note ? ?Kelli Navarro ?04/22/1951  ?[id number].   ? ?Requesting MD: Dr. Stark Jock ?Chief Complaint/Reason for Consult: Abdominal pain ? ?HPI:  ?71 year old female with medical history of anxiety, seizures, who presented to [facility] ED with LUQ and left sided abdominal pain which began 1 week ago but worsened in the 24 hours leading up to ED presentation. Pain is constant and worsening. She has tried ibuprofen and tylenol without relief. Pain exacerbated by eating and therefore she has had very little food since last Sunday. She has been drinking water. She has had nausea and diarrhea - last BM Sunday. She has had reflux during this time which is not normal for her. No emesis until arrival to ED yesterday at which time she had episode of clear emesis. Pain is 8/10 at its most severe but currently 3/10 after pain meds in the ED. No nausea currently. At baseline she does not normally have abdominal complaints and has had no similar episodes or postprandial RUQ abdominal pain. She normally has daily bowel movements. Last colonoscopy 10 years ago without polyps or abnormalities per her report. She denies recent sick contacts ? ?She was evaluated by PCP for this problem on 4/11 with CT scan showing cholelithiasis without cholecystitis and otherwise no acute findings.  ? ?Substance use: none ?Blood thinners: none ?Past Surgeries: none ? ? ?ROS: ?Review of Systems  ?Constitutional:  Negative for chills and fever.  ?Respiratory:  Negative for cough and shortness of breath.   ?Cardiovascular:  Negative for chest pain and leg swelling.  ?Gastrointestinal:  Positive for abdominal pain, diarrhea, heartburn, nausea and vomiting. Negative for blood in stool and melena.  ?Genitourinary: Negative.   ? ?Family History  ?Problem Relation Age of Onset  ? Multiple sclerosis Mother   ? Heart Problems Mother   ?     by-pass  ? Alcohol abuse Mother   ? Heart Problems Sister   ?     pacemaker  ? ? ?Past Medical History:   ?Diagnosis Date  ? ADHD   ? Anxiety   ? Chronic back pain   ? from broken back  ? Depression   ? Right femoral fracture ([place])   ? [facility] spotted fever   ? may show low Hgb  ? Seizures ([place])   ? has during sleep  ? Vitamin B 12 deficiency   ? ? ?Past Surgical History:  ?Procedure Laterality Date  ? BACK SURGERY    ? none    ? radial frequency ablasion  05/2019  ? back  ? 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 Procedure: TOTAL HIP ARTHROPLASTY ANTERIOR APPROACH;  Surgeon: Rod Can, MD;  Location: [place];  Service: Orthopedics;  Laterality: Left;  ? TOTAL HIP ARTHROPLASTY Right 08/16/2020  ? Procedure: TOTAL HIP ARTHROPLASTY ANTERIOR APPROACH;  Surgeon: Rod Can, MD;  Location: [facility];  Service: Orthopedics;  Laterality: Right;  ? ? ?Social History:  reports that she has never smoked. She has never used smokeless tobacco. She reports that she does not drink alcohol and does not use drugs. ? ?Allergies:  ?Allergies  ?Allergen Reactions  ? Other Anaphylaxis  ?  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 Aptiom [Eslicarbazepine] Other (See Comments)  ?  Unknown reaction  ? Azithromycin Other (See Comments)  ?  Unknown reaction ?  ? Bupropion Other (See Comments)  ?   Caused depression  ? Carbamazepine Other (See Comments)  ?  Unknown  reaction ?  ? Dilantin [Phenytoin Sodium Extended] Other (See Comments)  ?  Unknown reaction ?  ? Diphenhydramine Other (See Comments)  ?  Feels like "spiders are crawling" on her  ? Divalproex Sodium Other (See Comments)  ?  Unknown reaction ?  ? Keppra [Levetiracetam] Other (See Comments)  ?  Unknown reaction ?  ? Lamictal [Lamotrigine] Other (See Comments)  ?  Unknown reaction ?  ? Lorazepam Swelling and Other (See Comments)  ?  Hallucinations  ? Lyrica [Pregabalin] Other (See Comments)  ?  Pt reports feeling loopy and confused  ? Morphine And Related Other (See Comments)  ?  Unknown reaction - (high doses)  ? Prednisone Other (See Comments)  ?  Unknown reaction ?   ? Vimpat [Lacosamide] Other (See Comments)  ?  Unknown reaction ?  ? ? ?(Not in a hospital admission) ? ? ?Blood pressure 130/88, pulse 93, temperature 98.9 ?F (37.2 ?C), temperature source Oral, resp. rate 18, SpO2 99 %. ?Physical Exam: ?General: pleasant, WD, female who is laying in bed in NAD ?HEENT: head is normocephalic, atraumatic.  Sclera are noninjected.  Pupils equal and round. EOMs intact.  Ears and nose without any masses or lesions.  Mouth is pink and moist ?Heart: regular, rate, and rhythm.  Normal s1,s2. No obvious murmurs, gallops, or rubs noted.   ?Lungs: CTAB, no wheezes, rhonchi, or rales noted.  Respiratory effort nonlabored ?Abd: soft, ND, +BS, no masses, hernias, or organomegaly. Very mild TTP epigastrium, LUQ and LLQ without rebound or guarding ?MSK: all 4 extremities are symmetrical with no cyanosis, clubbing, or edema. ?Skin: warm and dry with no masses, lesions, or rashes ?Neuro: Cranial nerves 2-12 grossly intact, sensation is normal throughout ?Psych: A&Ox3 with an appropriate affect.  ? ? ?Results for orders placed or performed during the hospital encounter of 10/16/21 (from the past 48 hour(s))  ?Urinalysis, Routine w reflex microscopic     Status: Abnormal  ? Collection Time: 10/16/21  3:19 PM  ?Result Value Ref Range  ? Color, Urine YELLOW YELLOW  ? APPearance CLEAR CLEAR  ? Specific Gravity, Urine 1.017 1.005 - 1.030  ? pH 5.0 5.0 - 8.0  ? Glucose, UA NEGATIVE NEGATIVE mg/dL  ? Hgb urine dipstick NEGATIVE NEGATIVE  ? Bilirubin Urine NEGATIVE NEGATIVE  ? Ketones, ur 20 (A) NEGATIVE mg/dL  ? Protein, ur NEGATIVE NEGATIVE mg/dL  ? Nitrite NEGATIVE NEGATIVE  ? Leukocytes,Ua NEGATIVE NEGATIVE  ?  Comment: Performed at [facility], [place] [address], [place], [place] [address]  ?CBC with Differential     Status: Abnormal  ? Collection Time: 10/16/21  3:29 PM  ?Result Value Ref Range  ? WBC 3.4 (L) 4.0 - 10.5 K/uL  ? RBC 5.12 (H) 3.87 - 5.11 MIL/uL  ? Hemoglobin 13.9 12.0 - 15.0 g/dL  ?  HCT 46.0 36.0 - 46.0 %  ? MCV 89.8 80.0 - 100.0 fL  ? MCH 27.1 26.0 - 34.0 pg  ? MCHC 30.2 30.0 - 36.0 g/dL  ? RDW 13.8 11.5 - 15.5 %  ? Platelets 350 150 - 400 K/uL  ? nRBC 0.0 0.0 - 0.2 %  ? Neutrophils Relative % 57 %  ? Neutro Abs 1.9 1.7 - 7.7 K/uL  ? Lymphocytes Relative 32 %  ? Lymphs Abs 1.1 0.7 - 4.0 K/uL  ? Monocytes Relative 9 %  ? Monocytes Absolute 0.3 0.1 - 1.0 K/uL  ? Eosinophils Relative 1 %  ? Eosinophils Absolute 0.0 0.0 -  0.5 K/uL  ? Basophils Relative 1 %  ? Basophils Absolute 0.0 0.0 - 0.1 K/uL  ? Immature Granulocytes 0 %  ? Abs Immature Granulocytes 0.01 0.00 - 0.07 K/uL  ?  Comment: Performed at [facility], [place] [address], [place], [place] [address]  ?Comprehensive metabolic panel     Status: Abnormal  ? Collection Time: 10/16/21  3:29 PM  ?Result Value Ref Range  ? Sodium 139 135 - 145 mmol/L  ? Potassium 2.8 (L) 3.5 - 5.1 mmol/L  ? Chloride 111 98 - 111 mmol/L  ? CO2 14 (L) 22 - 32 mmol/L  ? Glucose, Bld 73 70 - 99 mg/dL  ?  Comment: Glucose reference range applies only to samples taken after fasting for at least 8 hours.  ? BUN 20 8 - 23 mg/dL  ? Creatinine, Ser 0.88 0.44 - 1.00 mg/dL  ? Calcium 9.4 8.9 - 10.3 mg/dL  ? Total Protein 7.6 6.5 - 8.1 g/dL  ? Albumin 4.0 3.5 - 5.0 g/dL  ? AST 18 15 - 41 U/L  ? ALT 11 0 - 44 U/L  ? Alkaline Phosphatase 105 38 - 126 U/L  ? Total Bilirubin 0.6 0.3 - 1.2 mg/dL  ? GFR, Estimated >60 >60 mL/min  ?  Comment: (NOTE) ?Calculated using the CKD-EPI Creatinine Equation (2021) ?  ? Anion gap 14 5 - 15  ?  Comment: Performed at [facility], [place] [address], [place], [place] [address]  ?Lipase, blood     Status: None  ? Collection Time: 10/16/21  3:29 PM  ?Result Value Ref Range  ? Lipase 38 11 - 51 U/L  ?  Comment: Performed at [facility], [place] [address], [place], [place] [address]  ?Resp Panel by RT-PCR (Flu A&B, Covid) Nasopharyngeal Swab     Status: None  ? Collection Time: 10/17/21 12:04 AM  ? Specimen: Nasopharyngeal Swab;  Nasopharyngeal(NP) swabs in vial transport medium  ?Result Value Ref Range  ? SARS Coronavirus 2 by RT PCR NEGATIVE NEGATIVE  ?  Comment: (NOTE) ?SARS-CoV-2 target nucleic acids are NOT DETECTED. ? ?The SARS-CoV-2 RN

## 2021-10-17 NOTE — Consult Note (Addendum)
? ?Referring Provider: Quincy Carnes PA-C ?Primary Care Physician:  Rubie Maid, MD ?Primary Gastroenterologist: Althia Forts ? ?Reason for Consultation: Abdominal pain ? ?HPI: Kelli Navarro is a 71 y.o. female with a past medical history of anxiety, ADHD, depression, hypertension, seizures, vitamin B12 deficiency, restless leg syndrome, chronic back pain and gallstones. ? ?She developed epigastric, LUQ pain, nausea without vomiting and nonbloody diarrhea "up all night" on Fri 10/11/2021.  No specific food or stress triggers.  Sat 4/8 she had similar LUQ pain which radiated to the left mid abdomen with 5 episodes of nonbloody diarrhea. Sunday 4/ 9 she continued to have LUQ which radiated to the left mid abdominal pain, no further diarrhea. Eating worsened her abdominal pain. She was evaluated by her PCP 10/15/2021 and a CTAP with contrast done at [facility] identified cholelithiasis without acute cholecystitis and the CBD was mildly dilated. Her LUQ pain worsened yesterday so she presented to the ED 10/16/2021 for further evaluation.  Labs in the ED showed a WBC count of 3.4.  Hemoglobin 13.9.  Hematocrit 46.0.  Platelet 358.  Sodium 139.  Potassium 2.8.  BUN 20.  Creatinine 0.88.  Alk phos 105.  Total bili 0.6.  AST 18.  ALT 11.  Lipase 38.  RUQ sonogram showed a 14 mm calcified gallstone within the gallbladder lumen without evidence of acute cholecystitis.  CBD diameter 8 mm. Limited evaluation of the left hepatic lobe secondary to bowel gas.  A GI consult was requested for further evaluation regarding LUQ pain. ? ?She denies having any dysphagia or heartburn.  She denies ever having an EGD.  She takes ASA 81 mg once daily.  No other NSAIDs.  No recent antibiotics.  She typically passes a normal formed brown bowel movement daily.  No rectal bleeding or black stools.  She underwent 3 colonoscopies in the past which were all normal.  Her last colonoscopy completed by her gastroenterologist at [facility] was  approximately 10 years ago.  No family history of IBD or colorectal cancer.  No fevers or weight loss.  No alcohol use. ? ?RUQ sonogram 10/17/2021:  ?COMPARISON: August 05, 2021  ? ?TECHNIQUE: Axial scans were performed through the abdomen with intravenous contrast. Radiation dose reduction was utilized (automated exposure control, mA or kV adjustment based on patient size, or iterative image reconstruction).  ? ?CONTRAST:  80 mL cc of IOPAMIDOL 76 % IV SOLN.  ? ?FINDINGS:  ?Lower thorax: No suspicious abnormalities.  ? ?Abdomen:  ?Liver: No suspicious abnormalities.  ?Gallbladder: Cholelithiasis. Dilation of the common duct.  ?Spleen: No suspicious abnormalities.  ?Pancreas: No suspicious abnormalities.  ?Adrenal glands: No suspicious abnormalities.  ?Kidneys: No suspicious abnormalities. No hydronephrosis. Nonobstructing calculi within the bilateral kidneys measuring up to 3 mm.  ?Vascular: No suspicious abnormalities.  ?Peritoneum/Retroperitoneum: No free air or drainable fluid collection.  ?Lymph Nodes: No suspicious lymph nodes.  ?Bowel: No suspicious abnormalities ?1. Cholelithiasis with no acute cholecystitis. ?2. Limited evaluation of the left hepatic lobe due to bowel gas. ?Impression: ?1.  Cholelithiasis. Mild dilation of the common duct, nonspecific. No obstructing lesion identified on CT.   ?2.  Nonobstructing bilateral nephrolithiasis.   ? ? ?Past Medical History:  ?Diagnosis Date  ? ADHD   ? Anxiety   ? Chronic back pain   ? from broken back  ? Depression   ? Right femoral fracture ([place])   ? [facility] spotted fever   ? may show low Hgb  ? Seizures ([place])   ?  has during sleep  ? Vitamin B 12 deficiency   ? ? ?Past Surgical History:  ?Procedure Laterality Date  ? BACK SURGERY    ? none    ? radial frequency ablasion  05/2019  ? back  ? 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 Procedure: TOTAL HIP ARTHROPLASTY ANTERIOR APPROACH;  Surgeon: Rod Can, MD;  Location: [place];  Service: Orthopedics;   Laterality: Left;  ? TOTAL HIP ARTHROPLASTY Right 08/16/2020  ? Procedure: TOTAL HIP ARTHROPLASTY ANTERIOR APPROACH;  Surgeon: Rod Can, MD;  Location: [facility];  Service: Orthopedics;  Laterality: Right;  ? ? ?Prior to Admission medications   ?Medication Sig Start Date End Date Taking? Authorizing Provider  ?acetaminophen (TYLENOL) 500 MG tablet Take 1,000 mg by mouth every 6 (six) hours as needed for moderate pain or headache (pain).   Yes [provider]  ?amphetamine-dextroamphetamine (ADDERALL) 20 MG tablet Take 1 tablet (20 mg total) by mouth 2 (two) times daily. May take an additional tablet as needed 09/18/21  Yes Thayer Headings, PMHNP  ?cloNIDine (CATAPRES) 0.1 MG tablet Take 1 tablet (0.1 mg total) by mouth 2 (two) times daily. 09/05/21  Yes Thayer Headings, [place]  ?[facility] ASPIRIN ADULT LOW DOSE 81 MG chewable tablet Chew 81 mg by mouth daily. 05/09/21  Yes [provider]  ?ipratropium (ATROVENT) 0.06 % nasal spray Place 2 sprays into both nostrils daily. 10/11/19  Yes [provider]  ?Multiple Vitamin (MULTIVITAMIN WITH MINERALS) TABS tablet Take 1 tablet by mouth daily.   Yes [provider]  ?Suvorexant (BELSOMRA) 20 MG TABS Take 20 mg by mouth at bedtime. 08/14/21 10/17/21 Yes Thayer Headings, [place]  ?topiramate (TOPAMAX) 100 MG tablet Take 2 tabs twice a day ?Patient taking differently: Take 200 mg by mouth 2 (two) times daily. 02/15/21  Yes Cameron Sprang, MD  ?traMADol (ULTRAM-ER) 100 MG 24 hr tablet Take 100 mg by mouth daily. 09/16/21  Yes [provider]  ?hydrOXYzine (VISTARIL) 25 MG capsule Take 1 capsule (25 mg total) by mouth at bedtime as needed (Insomnia). ?Patient not taking: Reported on 10/17/2021 09/05/21   Thayer Headings, PMHNP  ?traMADol (ULTRAM-ER) 200 MG 24 hr tablet Take 200 mg by mouth daily as needed. 10/15/21   [provider]  ? ? ?No current facility-administered medications for this encounter.  ? ?Current Outpatient Medications   ?Medication Sig Dispense Refill  ? acetaminophen (TYLENOL) 500 MG tablet Take 1,000 mg by mouth every 6 (six) hours as needed for moderate pain or headache (pain).    ? amphetamine-dextroamphetamine (ADDERALL) 20 MG tablet Take 1 tablet (20 mg total) by mouth 2 (two) times daily. May take an additional tablet as needed 75 tablet 0  ? cloNIDine (CATAPRES) 0.1 MG tablet Take 1 tablet (0.1 mg total) by mouth 2 (two) times daily. 180 tablet 0  ? [facility] ASPIRIN ADULT LOW DOSE 81 MG chewable tablet Chew 81 mg by mouth daily.    ? ipratropium (ATROVENT) 0.06 % nasal spray Place 2 sprays into both nostrils daily.    ? Multiple Vitamin (MULTIVITAMIN WITH MINERALS) TABS tablet Take 1 tablet by mouth daily.    ? Suvorexant (BELSOMRA) 20 MG TABS Take 20 mg by mouth at bedtime. 30 tablet 2  ? topiramate (TOPAMAX) 100 MG tablet Take 2 tabs twice a day (Patient taking differently: Take 200 mg by mouth 2 (two) times daily.) 360 tablet 3  ? traMADol (ULTRAM-ER) 100 MG 24 hr tablet Take 100 mg by mouth daily.    ?  hydrOXYzine (VISTARIL) 25 MG capsule Take 1 capsule (25 mg total) by mouth at bedtime as needed (Insomnia). (Patient not taking: Reported on 10/17/2021) 30 capsule 1  ? traMADol (ULTRAM-ER) 200 MG 24 hr tablet Take 200 mg by mouth daily as needed.    ? ? ?Allergies as of 10/16/2021 - Review Complete 10/16/2021  ?Allergen Reaction Noted  ? Other Anaphylaxis 03/21/2017  ? Aptiom [eslicarbazepine] Other (See Comments) 12/29/2014  ? Azithromycin Other (See Comments) 06/08/2018  ? Bupropion Other (See Comments) 12/29/2014  ? Carbamazepine Other (See Comments) 06/08/2018  ? Dilantin [phenytoin sodium extended] Other (See Comments) 12/29/2014  ? Diphenhydramine Other (See Comments) 12/29/2014  ? Divalproex sodium Other (See Comments) 12/29/2014  ? Keppra [levetiracetam] Other (See Comments) 12/29/2014  ? Lamictal [lamotrigine] Other (See Comments) 12/29/2014  ? Lorazepam Swelling and Other (See Comments) 01/02/2017  ? Lyrica  [pregabalin] Other (See Comments) 12/29/2014  ? Morphine and related Other (See Comments) 12/29/2014  ? Prednisone Other (See Comments) 12/29/2014  ? Vimpat [lacosamide] Other (See Comments) 06/08/2018  ? ? ?Fam

## 2021-10-17 NOTE — ED Provider Notes (Signed)
? ?  Spoke with on call GI-- Dr. Lyndel Safe.  Team will see her in the AM for consultation.   ? ?Discussed with Dr. Marlowe Sax-- will admit for ongoing care.  Aware of pending AM consults. ?  ?Larene Pickett, PA-C ?10/17/21 0140 ? ?  ?Veryl Speak, MD ?10/17/21 [phone] ? ?

## 2021-10-17 NOTE — ED Notes (Signed)
GI at bedside

## 2021-10-18 ENCOUNTER — Encounter (HOSPITAL_COMMUNITY): Payer: Self-pay | Admitting: Surgery

## 2021-10-18 DIAGNOSIS — K8020 Calculus of gallbladder without cholecystitis without obstruction: Secondary | ICD-10-CM | POA: Diagnosis not present

## 2021-10-18 LAB — COMPREHENSIVE METABOLIC PANEL
ALT: 15 U/L (ref 0–44)
AST: 24 U/L (ref 15–41)
Albumin: 3.3 g/dL — ABNORMAL LOW (ref 3.5–5.0)
Alkaline Phosphatase: 92 U/L (ref 38–126)
Anion gap: 7 (ref 5–15)
BUN: 8 mg/dL (ref 8–23)
CO2: 19 mmol/L — ABNORMAL LOW (ref 22–32)
Calcium: 8.6 mg/dL — ABNORMAL LOW (ref 8.9–10.3)
Chloride: 114 mmol/L — ABNORMAL HIGH (ref 98–111)
Creatinine, Ser: 0.69 mg/dL (ref 0.44–1.00)
GFR, Estimated: >60 mL/min (ref >60)
Glucose, Bld: 89 mg/dL (ref 70–99)
Potassium: 3.2 mmol/L — ABNORMAL LOW (ref 3.5–5.1)
Sodium: 140 mmol/L (ref 135–145)
Total Bilirubin: 0.5 mg/dL (ref 0.3–1.2)
Total Protein: 6.4 g/dL — ABNORMAL LOW (ref 6.5–8.1)

## 2021-10-18 LAB — SURGICAL PATHOLOGY

## 2021-10-18 MED ORDER — POTASSIUM CHLORIDE CRYS ER 20 MEQ PO TBCR
40.0000 meq | EXTENDED_RELEASE_TABLET | Freq: Once | ORAL | Status: AC
Start: 2021-10-18 — End: 2021-10-18
  Administered 2021-10-18: 40 meq via ORAL
  Filled 2021-10-18: qty 2

## 2021-10-18 MED ORDER — TRAMADOL HCL 50 MG PO TABS
50.0000 mg | ORAL_TABLET | Freq: Four times a day (QID) | ORAL | Status: DC | PRN
  Administered 2021-10-18: 100 mg via ORAL
  Filled 2021-10-18 (×2): qty 1

## 2021-10-18 MED ORDER — POLYETHYLENE GLYCOL 3350 17 G PO PACK: 17.0000 g | PACK | Freq: Every day | ORAL | Status: DC | PRN

## 2021-10-18 MED ORDER — PROPOFOL 10 MG/ML IV BOLUS
INTRAVENOUS | Status: AC
Start: 2021-10-18 — End: ?
  Filled 2021-10-18: qty 20

## 2021-10-18 MED ORDER — TRAMADOL HCL 50 MG PO TABS: 50.0000 mg | ORAL_TABLET | Freq: Four times a day (QID) | ORAL | 0 refills | Status: DC | PRN

## 2021-10-18 MED ORDER — ACETAMINOPHEN 500 MG PO TABS
1000.0000 mg | ORAL_TABLET | Freq: Four times a day (QID) | ORAL | Status: DC
Start: 2021-10-18 — End: 2021-10-18
  Administered 2021-10-18: 1000 mg via ORAL
  Filled 2021-10-18: qty 2

## 2021-10-18 MED ORDER — SIMETHICONE 80 MG PO CHEW: 80.0000 mg | CHEWABLE_TABLET | Freq: Four times a day (QID) | ORAL | Status: DC | PRN

## 2021-10-18 MED ORDER — POLYETHYLENE GLYCOL 3350 17 G PO PACK: 17.0000 g | PACK | Freq: Every day | ORAL | 0 refills | Status: DC | PRN

## 2021-10-18 MED ORDER — FAMOTIDINE 20 MG PO TABS
20.0000 mg | ORAL_TABLET | Freq: Every day | ORAL | Status: DC
  Administered 2021-10-18: 20 mg via ORAL
  Filled 2021-10-18: qty 1

## 2021-10-18 MED ORDER — IBUPROFEN 600 MG PO TABS
600.0000 mg | ORAL_TABLET | Freq: Four times a day (QID) | ORAL | Status: DC
  Administered 2021-10-18: 600 mg via ORAL
  Filled 2021-10-18: qty 1

## 2021-10-18 MED ORDER — PROPOFOL 10 MG/ML IV BOLUS
INTRAVENOUS | Status: AC
  Filled 2021-10-18: qty 20

## 2021-10-18 NOTE — Progress Notes (Signed)
[facility] ROUNDING NOTE ? ? ?Subjective: ?Patient feeling much better today, ready to go home.  No nausea/vomiting.  Was having some abdominal pain earlier, but improved now ? ? ?Objective: ?Vital signs in last 24 hours: ?Temp:  [97.8 ?F (36.6 ?C)-98.8 ?F (37.1 ?C)] 98 ?F (36.7 ?C) (04/14 4235) ?Pulse Rate:  [70-94] 90 (04/14 0823) ?Resp:  [12-19] 18 (04/14 3614) ?BP: (115-162)/(64-79) 143/76 (04/14 4315) ?SpO2:  [98 %-100 %] 100 % (04/14 0823) ?Weight:  [55.8 kg-57.1 kg] 57.1 kg (04/14 0435) ?  ?General: NAD ?Abdomen:  Soft, appropriate mild TTP post surgical, ND, +BS ? ? ?Intake/Output from previous day: ?04/13 0701 - 04/14 0700 ?In: 842.8 [I.V.:742.8; IV Piggyback:100] ?Out: 20 [Blood:20] ?Intake/Output this shift: ?No intake/output data recorded. ? ? ?Lab Results: ?Recent Labs  ?  10/16/21 ?1529  ?WBC 3.4*  ?HGB 13.9  ?PLT 350  ?MCV 89.8  ? ?BMET ?Recent Labs  ?  10/16/21 ?1529 10/18/21 ?0825  ?NA 139 140  ?K 2.8* 3.2*  ?CL 111 114*  ?CO2 14* 19*  ?GLUCOSE 73 89  ?BUN 20 8  ?CREATININE 0.88 0.69  ?CALCIUM 9.4 8.6*  ? ?LFT ?Recent Labs  ?  10/16/21 ?1529 10/18/21 ?0825  ?PROT 7.6 6.4*  ?ALBUMIN 4.0 3.3*  ?AST 18 24  ?ALT 11 15  ?ALKPHOS 105 92  ?BILITOT 0.6 0.5  ? ?PT/INR ?No results for input(s): INR in the last 72 hours. ? ? ? ?Imaging/Other results: ?DG Cholangiogram Operative ? ?Result Date: 10/17/2021 ?CLINICAL DATA:  Cholecystectomy, cholelithiasis EXAM: INTRAOPERATIVE CHOLANGIOGRAM TECHNIQUE: Cholangiographic images from the C-arm fluoroscopic device were submitted for interpretation post-operatively. Please see the procedural report for the amount of contrast and the fluoroscopy time utilized. FLUOROSCOPY: Radiation Exposure Index (as provided by the fluoroscopic device): 5.17 mGy Kerma COMPARISON:  10/16/2021 FINDINGS: Intraoperative cholangiogram performed during the laparoscopic procedure. The residual cystic duct and mildly dilated common bile duct appear patent with contrast draining  into the duodenum. No obstruction. No large filling defect. IMPRESSION: Nonspecific CBD dilatation without obstruction Electronically Signed   By: Jerilynn Mages.  Shick M.D.   On: 10/17/2021 15:52  ? ?US Abdomen Limited ? ?Result Date: 10/16/2021 ?CLINICAL DATA:  abd pain EXAM: ULTRASOUND ABDOMEN LIMITED RIGHT UPPER QUADRANT COMPARISON:  None. FINDINGS: Gallbladder: Calcified gallstone within the gallbladder lumen measuring up to 14 mm. No gallbladder wall thickening or pericholecystic fluid visualized. No sonographic Murphy sign noted by sonographer. Common bile duct: Diameter: 8 mm. Liver: Limited evaluation of the left hepatic lobe due to bowel gas. No focal lesion identified. Within normal limits in parenchymal echogenicity. Portal vein is patent on color Doppler imaging with normal direction of blood flow towards the liver. Other: None. IMPRESSION: 1. Cholelithiasis with no acute cholecystitis. 2. Limited evaluation of the left hepatic lobe due to bowel gas. Electronically Signed   By: Iven Finn M.D.   On: 10/16/2021 16:25  ? ?MR ABDOMEN MRCP W WO CONTAST ? ?Result Date: 10/17/2021 ?CLINICAL DATA:  Epigastric pain EXAM: MRI ABDOMEN WITHOUT AND WITH CONTRAST (INCLUDING MRCP) TECHNIQUE: Multiplanar multisequence MR imaging of the abdomen was performed both before and after the administration of intravenous contrast. Heavily T2-weighted images of the biliary and pancreatic ducts were obtained, and three-dimensional MRCP images were rendered by post processing. CONTRAST:  81m GADAVIST GADOBUTROL 1 MMOL/ML IV SOLN COMPARISON:  Abdominal ultrasound 10/16/2021 FINDINGS: Study is limited due to motion. Lower chest: No acute findings. Hepatobiliary: Liver is normal in size and contour. No evidence of hepatic steatosis. No suspicious  hepatic mass identified. Gallbladder is moderately distended and contains a 1.2 cm calculus. No intrahepatic biliary ductal dilatation. Common bile duct is mildly dilated measuring 8.5 mm in  diameter. There is a 4 mm filling defect in the distal common bile duct visualized on the axial T2 nonfat sat and questionably seen on MRCP images which are significantly limited due to motion. Pancreas: No mass, inflammatory changes, or other parenchymal abnormality identified. Spleen:  Within normal limits in size and appearance. Adrenals/Urinary Tract: Adrenal glands appear normal. Small left renal cortical cyst. Kidneys appear otherwise normal. Stomach/Bowel: Visualized portions within the abdomen are unremarkable. Vascular/Lymphatic: No pathologically enlarged lymph nodes identified. No abdominal aortic aneurysm demonstrated. Other:  No ascites. Musculoskeletal: No suspicious bony lesions. IMPRESSION: 1. Study is limited due to motion including very limited MRCP images. There appears to be a 4 mm filling defect in the distal common bile duct suggesting choledocholithiasis. Common bile duct is mildly dilated. 2. Gallstone. Electronically Signed   By: Ofilia Neas M.D.   On: 10/17/2021 10:38   ? ? ? ?Assessment and Plan: ? ?71 year old female with symptomatic cholelithiasis, questionable choledocholiathiasis s/p lap chole POD1 with resolved abdominal pain, normal LAEs. ? ?Ok to d/c home from GI standpoint.   ? ?No GI follow up necessary except that she is due for colon cancer screening.  She can follow up with her PCM to discuss repeat stool test vs. Colonoscopy ? ?Defer to surgery for post op diet/pain/recs ? ? ? ?Daryel November, MD  10/18/2021, 12:07 PM ?[facility] ? ?

## 2021-10-18 NOTE — Anesthesia Postprocedure Evaluation (Signed)
Anesthesia Post Note ? ?Patient: Kelli Navarro ? ?Procedure(s) Performed: LAPAROSCOPIC CHOLECYSTECTOMY WITH INTRAOPERATIVE CHOLANGIOGRAM ? ?  ? ?Patient location during evaluation: PACU ?Anesthesia Type: General ?Level of consciousness: sedated and patient cooperative ?Pain management: pain level controlled ?Vital Signs Assessment: post-procedure vital signs reviewed and stable ?Respiratory status: spontaneous breathing ?Cardiovascular status: stable ?Anesthetic complications: no ? ? ?No notable events documented. ? ?Last Vitals:  ?Vitals:  ? 10/18/21 0435 10/18/21 0823  ?BP: 131/70 (!) 143/76  ?Pulse: 70 90  ?Resp: 18 18  ?Temp: 37.1 ?C 36.7 ?C  ?SpO2: 99% 100%  ?  ?Last Pain:  ?Vitals:  ? 10/18/21 0823  ?TempSrc: Oral  ?PainSc:   ? ? ?  ?  ?  ?  ?  ?  ? ?Nolon Nations ? ? ? ? ?

## 2021-10-18 NOTE — Discharge Summary (Signed)
Physician Discharge Summary  ?Kelli Navarro OQH:[id number] DOB: 20-Jul-1950 DOA: 10/16/2021 ? ?PCP: Rubie Maid, MD ? ?Admit date: 10/16/2021 ?Discharge date: 10/18/2021 ? ?Admitted From: Home ?Disposition:  Home ? ?Discharge Condition:Stable ?CODE STATUS:FULL ?Diet recommendation: regular   ? ?Brief/Interim Summary: ?Kelli Navarro is a 71 y.o. female with medical history significant of ADHD/anxiety/depression and chronic back pain presenting with abdominal pain.   Ultrasound of the right upper quadrant showed gallstones without evidence of cholecystitis.  MRCP showed mildly dilated CBD.  General surgery, GI consulted.  Patient underwent laparoscopic cholecystectomy, intraoperative cholangiogram did not show any filling defects.  General surgery cleared her for discharge today. ? ?Following problems were addressed during her hospitalization: ? ?Sx cholelithiasis ?-Patient without prior h/o abdominal surgery presenting with almost 1 week of abdominal pain ?-Pain (and diarrhea) was worse with eating, so she stopped eating ?-Normal LFTs ?-[place] with gallstone but no apparent cholecystitis ?-MRCP was ordered and showed mildly dilated CBD. ?-GI, general surgery consulted ?-Underwent cholecystectomy on 4/13, intraoperative cholangiogram did not show any filling defects ?  ?Mood d/o ?-Attempted suicide in 09/2020 ?-Appears to have stable mood at this time ?-Continue Clonidine and Belsomra (for insomnia) ?  ?Seizure d/o ?-Possibly nonepileptic, normal EEG in 05/2020 ?-She is not apparently taking seizure medications ?  ?Chronic pain ?-On tramadol at home ?  ?ADHD ?-Stable ? ?Hypokalemia ?Supplemented with potassium ? ?Discharge Diagnoses:  ?Principal Problem: ?  Symptomatic cholelithiasis ?Active Problems: ?  Chronic back pain ?  Seizure disorder ([place]) ?  Mood disorder ([place]) ?  ADHD ? ? ? ?Discharge Instructions ? ?Discharge Instructions   ? ? Diet - low sodium heart healthy   Complete by: As directed ?  ? Discharge  instructions   Complete by: As directed ?  ? 1)Please take prescribed medications as instructed ?2)Follow up with general surgery as an outpatient  ? Increase activity slowly   Complete by: As directed ?  ? ?  ? ?Allergies as of 10/18/2021   ? ?   Reactions  ? Other Anaphylaxis  ? 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 Aptiom [eslicarbazepine] Other (See Comments)  ? Unknown reaction  ? Azithromycin Other (See Comments)  ? Unknown reaction  ? Bupropion Other (See Comments)  ?  Caused depression  ? Carbamazepine Other (See Comments)  ? Unknown reaction  ? Dilantin [phenytoin Sodium Extended] Other (See Comments)  ? Unknown reaction  ? Diphenhydramine Other (See Comments)  ? Feels like "spiders are crawling" on her  ? Divalproex Sodium Other (See Comments)  ? Unknown reaction  ? Keppra [levetiracetam] Other (See Comments)  ? Unknown reaction  ? Lamictal [lamotrigine] Other (See Comments)  ? Unknown reaction  ? Lorazepam Swelling, Other (See Comments)  ? Hallucinations  ? Lyrica [pregabalin] Other (See Comments)  ? Pt reports feeling loopy and confused  ? Morphine And Related Other (See Comments)  ? Unknown reaction - (high doses)  ? Prednisone Other (See Comments)  ? Unknown reaction  ? Vimpat [lacosamide] Other (See Comments)  ? Unknown reaction  ? ?  ? ?  ?Medication List  ?  ? ?STOP taking these medications   ? ?traMADol 100 MG 24 hr tablet ?Commonly known as: ULTRAM-ER ?Replaced by: traMADol 50 MG tablet ?  ?traMADol 200 MG 24 hr tablet ?Commonly known as: ULTRAM-ER ?  ? ?  ? ?TAKE these medications   ? ?acetaminophen 500 MG tablet ?Commonly known as: TYLENOL ?Take 1,000 mg by mouth every 6 (  six) hours as needed for moderate pain or headache (pain). ?  ?amphetamine-dextroamphetamine 20 MG tablet ?Commonly known as: Adderall ?Take 1 tablet (20 mg total) by mouth 2 (two) times daily. May take an additional tablet as needed ?  ?Belsomra 20 MG Tabs ?Generic drug: Suvorexant ?Take 20 mg by mouth at bedtime. ?   ?cloNIDine 0.1 MG tablet ?Commonly known as: CATAPRES ?Take 1 tablet (0.1 mg total) by mouth 2 (two) times daily. ?  ?[employer] Aspirin Adult Low Dose 81 MG chewable tablet ?Generic drug: aspirin ?Chew 81 mg by mouth daily. ?  ?ipratropium 0.06 % nasal spray ?Commonly known as: ATROVENT ?Place 2 sprays into both nostrils daily. ?  ?multivitamin with minerals Tabs tablet ?Take 1 tablet by mouth daily. ?  ?polyethylene glycol 17 g packet ?Commonly known as: MIRALAX / GLYCOLAX ?Take 17 g by mouth daily as needed for mild constipation. ?  ?topiramate 100 MG tablet ?Commonly known as: TOPAMAX ?Take 2 tabs twice a day ?What changed:  ?how much to take ?how to take this ?when to take this ?additional instructions ?  ?traMADol 50 MG tablet ?Commonly known as: ULTRAM ?Take 1 tablet (50 mg total) by mouth every 6 (six) hours as needed for severe pain. ?Replaces: traMADol 100 MG 24 hr tablet ?  ? ?  ? ? Follow-up Information   ? ? Maczis, Puja Gosai, PA-C. Go on 11/07/2021.   ?Specialty: General Surgery ?Why: Your appointment is 11/07/21 at 3:30 pm ?Please arrive 30 minutes prior to your appointment to check in and fill out paperwork. Bring photo ID and insurance information. ?Contact information: ?[address] ?[address] ?[place] [address] ?[phone] ? ? ?  ?  ? ?  ?  ? ?  ? ?Allergies  ?Allergen Reactions  ? Other Anaphylaxis  ?  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 Aptiom [Eslicarbazepine] Other (See Comments)  ?  Unknown reaction  ? Azithromycin Other (See Comments)  ?  Unknown reaction ?  ? Bupropion Other (See Comments)  ?   Caused depression  ? Carbamazepine Other (See Comments)  ?  Unknown reaction ?  ? Dilantin [Phenytoin Sodium Extended] Other (See Comments)  ?  Unknown reaction ?  ? Diphenhydramine Other (See Comments)  ?  Feels like "spiders are crawling" on her  ? Divalproex Sodium Other (See Comments)  ?  Unknown reaction ?  ? Keppra [Levetiracetam] Other (See Comments)  ?  Unknown reaction ?  ? Lamictal  [Lamotrigine] Other (See Comments)  ?  Unknown reaction ?  ? Lorazepam Swelling and Other (See Comments)  ?  Hallucinations  ? Lyrica [Pregabalin] Other (See Comments)  ?  Pt reports feeling loopy and confused  ? Morphine And Related Other (See Comments)  ?  Unknown reaction - (high doses)  ? Prednisone Other (See Comments)  ?  Unknown reaction ?  ? Vimpat [Lacosamide] Other (See Comments)  ?  Unknown reaction ?  ? ? ?Consultations: ?GI,Surgery ? ? ?Procedures/Studies: ?DG Cholangiogram Operative ? ?Result Date: 10/17/2021 ?CLINICAL DATA:  Cholecystectomy, cholelithiasis EXAM: INTRAOPERATIVE CHOLANGIOGRAM TECHNIQUE: Cholangiographic images from the C-arm fluoroscopic device were submitted for interpretation post-operatively. Please see the procedural report for the amount of contrast and the fluoroscopy time utilized. FLUOROSCOPY: Radiation Exposure Index (as provided by the fluoroscopic device): 5.17 mGy Kerma COMPARISON:  10/16/2021 FINDINGS: Intraoperative cholangiogram performed during the laparoscopic procedure. The residual cystic duct and mildly dilated common bile duct appear patent with contrast draining into the duodenum.  No obstruction. No large filling defect. IMPRESSION: Nonspecific CBD dilatation without obstruction Electronically Signed   By: Jerilynn Mages.  Shick M.D.   On: 10/17/2021 15:52  ? ?US Abdomen Limited ? ?Result Date: 10/16/2021 ?CLINICAL DATA:  abd pain EXAM: ULTRASOUND ABDOMEN LIMITED RIGHT UPPER QUADRANT COMPARISON:  None. FINDINGS: Gallbladder: Calcified gallstone within the gallbladder lumen measuring up to 14 mm. No gallbladder wall thickening or pericholecystic fluid visualized. No sonographic Murphy sign noted by sonographer. Common bile duct: Diameter: 8 mm. Liver: Limited evaluation of the left hepatic lobe due to bowel gas. No focal lesion identified. Within normal limits in parenchymal echogenicity. Portal vein is patent on color Doppler imaging with normal direction of blood flow towards  the liver. Other: None. IMPRESSION: 1. Cholelithiasis with no acute cholecystitis. 2. Limited evaluation of the left hepatic lobe due to bowel gas. Electronically Signed   By: Iven Finn M.D.   On: 04/1

## 2021-10-18 NOTE — Progress Notes (Addendum)
[facility] ?Progress Note ? ?1 Day Post-Op  ?Subjective: ?CC-  ?Having more abdominal discomfort than she expected she would. Pain is different than prior to surgery. Pain is mostly RUQ but also all over. Dilaudid helps, oxycodone does not. Denies any n/v and tolerated dinner last night. No flatus or BM. Ambulating to restroom without difficulty. ? ?Objective: ?Vital signs in last 24 hours: ?Temp:  [97.8 ?F (36.6 ?C)-98.8 ?F (37.1 ?C)] 98.8 ?F (37.1 ?C) (04/14 0435) ?Pulse Rate:  [70-94] 70 (04/14 0435) ?Resp:  [12-19] 18 (04/14 0435) ?BP: (115-162)/(64-80) 131/70 (04/14 0435) ?SpO2:  [98 %-100 %] 99 % (04/14 0435) ?Weight:  [55.8 kg-57.1 kg] 57.1 kg (04/14 0435) ?  ? ?Intake/Output from previous day: ?04/13 0701 - 04/14 0700 ?In: 842.8 [I.V.:742.8; IV Piggyback:100] ?Out: 20 [Blood:20] ?Intake/Output this shift: ?No intake/output data recorded. ? ?PE: ?Gen:  Alert, NAD, pleasant ?Abd: Soft, mild RUQ tenderness without rebound or guarding, incision cdi with some ecchymosis noted at periumbilical incision but no erythema or drainage ? ?Lab Results:  ?Recent Labs  ?  10/16/21 ?1529  ?WBC 3.4*  ?HGB 13.9  ?HCT 46.0  ?PLT 350  ? ?BMET ?Recent Labs  ?  10/16/21 ?1529  ?NA 139  ?K 2.8*  ?CL 111  ?CO2 14*  ?GLUCOSE 73  ?BUN 20  ?CREATININE 0.88  ?CALCIUM 9.4  ? ?PT/INR ?No results for input(s): LABPROT, INR in the last 72 hours. ?CMP  ?   ?Component Value Date/Time  ? NA 139 10/16/2021 1529  ? K 2.8 (L) 10/16/2021 1529  ? CL 111 10/16/2021 1529  ? CO2 14 (L) 10/16/2021 1529  ? GLUCOSE 73 10/16/2021 1529  ? BUN 20 10/16/2021 1529  ? CREATININE 0.88 10/16/2021 1529  ? CREATININE 0.84 01/05/2020 1607  ? CALCIUM 9.4 10/16/2021 1529  ? PROT 7.6 10/16/2021 1529  ? ALBUMIN 4.0 10/16/2021 1529  ? AST 18 10/16/2021 1529  ? AST 19 01/05/2020 1607  ? ALT 11 10/16/2021 1529  ? ALT 14 01/05/2020 1607  ? ALKPHOS 105 10/16/2021 1529  ? BILITOT 0.6 10/16/2021 1529  ? BILITOT 0.3 01/05/2020 1607  ? GFRNONAA >60 10/16/2021  1529  ? GFRNONAA >60 01/05/2020 1607  ? GFRAA >60 04/07/2020 1527  ? GFRAA >60 01/05/2020 1607  ? ?Lipase  ?   ?Component Value Date/Time  ? LIPASE 38 10/16/2021 1529  ? ? ? ? ? ?Studies/Results: ?DG Cholangiogram Operative ? ?Result Date: 10/17/2021 ?CLINICAL DATA:  Cholecystectomy, cholelithiasis EXAM: INTRAOPERATIVE CHOLANGIOGRAM TECHNIQUE: Cholangiographic images from the C-arm fluoroscopic device were submitted for interpretation post-operatively. Please see the procedural report for the amount of contrast and the fluoroscopy time utilized. FLUOROSCOPY: Radiation Exposure Index (as provided by the fluoroscopic device): 5.17 mGy Kerma COMPARISON:  10/16/2021 FINDINGS: Intraoperative cholangiogram performed during the laparoscopic procedure. The residual cystic duct and mildly dilated common bile duct appear patent with contrast draining into the duodenum. No obstruction. No large filling defect. IMPRESSION: Nonspecific CBD dilatation without obstruction Electronically Signed   By: Jerilynn Mages.  Shick M.D.   On: 10/17/2021 15:52  ? ?US Abdomen Limited ? ?Result Date: 10/16/2021 ?CLINICAL DATA:  abd pain EXAM: ULTRASOUND ABDOMEN LIMITED RIGHT UPPER QUADRANT COMPARISON:  None. FINDINGS: Gallbladder: Calcified gallstone within the gallbladder lumen measuring up to 14 mm. No gallbladder wall thickening or pericholecystic fluid visualized. No sonographic Murphy sign noted by sonographer. Common bile duct: Diameter: 8 mm. Liver: Limited evaluation of the left hepatic lobe due to bowel gas. No focal lesion identified. Within normal limits  in parenchymal echogenicity. Portal vein is patent on color Doppler imaging with normal direction of blood flow towards the liver. Other: None. IMPRESSION: 1. Cholelithiasis with no acute cholecystitis. 2. Limited evaluation of the left hepatic lobe due to bowel gas. Electronically Signed   By: Iven Finn M.D.   On: 10/16/2021 16:25  ? ?MR ABDOMEN MRCP W WO CONTAST ? ?Result Date:  10/17/2021 ?CLINICAL DATA:  Epigastric pain EXAM: MRI ABDOMEN WITHOUT AND WITH CONTRAST (INCLUDING MRCP) TECHNIQUE: Multiplanar multisequence MR imaging of the abdomen was performed both before and after the administration of intravenous contrast. Heavily T2-weighted images of the biliary and pancreatic ducts were obtained, and three-dimensional MRCP images were rendered by post processing. CONTRAST:  51m GADAVIST GADOBUTROL 1 MMOL/ML IV SOLN COMPARISON:  Abdominal ultrasound 10/16/2021 FINDINGS: Study is limited due to motion. Lower chest: No acute findings. Hepatobiliary: Liver is normal in size and contour. No evidence of hepatic steatosis. No suspicious hepatic mass identified. Gallbladder is moderately distended and contains a 1.2 cm calculus. No intrahepatic biliary ductal dilatation. Common bile duct is mildly dilated measuring 8.5 mm in diameter. There is a 4 mm filling defect in the distal common bile duct visualized on the axial T2 nonfat sat and questionably seen on MRCP images which are significantly limited due to motion. Pancreas: No mass, inflammatory changes, or other parenchymal abnormality identified. Spleen:  Within normal limits in size and appearance. Adrenals/Urinary Tract: Adrenal glands appear normal. Small left renal cortical cyst. Kidneys appear otherwise normal. Stomach/Bowel: Visualized portions within the abdomen are unremarkable. Vascular/Lymphatic: No pathologically enlarged lymph nodes identified. No abdominal aortic aneurysm demonstrated. Other:  No ascites. Musculoskeletal: No suspicious bony lesions. IMPRESSION: 1. Study is limited due to motion including very limited MRCP images. There appears to be a 4 mm filling defect in the distal common bile duct suggesting choledocholithiasis. Common bile duct is mildly dilated. 2. Gallstone. Electronically Signed   By: DOfilia NeasM.D.   On: 10/17/2021 10:38   ? ?Anti-infectives: ?Anti-infectives (From admission, onward)  ? ? Start      Dose/Rate Route Frequency Ordered Stop  ? 10/18/21 0600  ceFAZolin (ANCEF) IVPB 2g/100 mL premix       ? 2 g ?200 mL/hr over 30 Minutes Intravenous On call to O.R. 10/17/21 1336 10/17/21 1451  ? ?  ? ? ? ?Assessment/Plan ?Cholelithiasis ?POD#1 s/p laparoscopic cholecystectomy 4/13 Dr. AZenia Resides?- IOC negative for CBD obstruction ?- Schedule tylenol and ibuprofen for better pain control. Switch oxycodone to tramadol. Mobilize, she is not having any bowel function yet but tolerating diet without n/v. Simethicone PRN. If pain control improves she is ok for discharge later today from our standpoint, otherwise she should stay another day. Discharge instructions and follow up info on AVS.  I will send rx for pain medication to her pharmacy. ? ?ID - ancef periop ?FEN - SLIV, soft diet ?VTE - lovenox ?Foley - none ? ? ? LOS: 0 days  ? ? ?BWellington Hampshire PA-C ?[facility] ?10/18/2021, 8:29 AM ?Please see Amion for pager number during day hours 7:00am-4:30pm ? ?

## 2021-10-18 NOTE — Plan of Care (Signed)
Goals met ?

## 2021-10-24 ENCOUNTER — Emergency Department (HOSPITAL_COMMUNITY): Payer: Medicare HMO

## 2021-10-24 ENCOUNTER — Inpatient Hospital Stay (HOSPITAL_COMMUNITY)
Admission: EM | Admit: 2021-10-24 | Discharge: 2021-10-30 | DRG: 384 | Disposition: A | Discharge status: Home / Self Care | Payer: Medicare HMO | Attending: General Surgery | Admitting: General Surgery

## 2021-10-24 ENCOUNTER — Encounter (HOSPITAL_COMMUNITY): Payer: Self-pay | Admitting: Emergency Medicine

## 2021-10-24 DIAGNOSIS — F419 Anxiety disorder, unspecified: Secondary | ICD-10-CM | POA: Diagnosis present

## 2021-10-24 DIAGNOSIS — Z9889 Other specified postprocedural states: Secondary | ICD-10-CM

## 2021-10-24 DIAGNOSIS — Z888 Allergy status to other drugs, medicaments and biological substances status: Secondary | ICD-10-CM

## 2021-10-24 DIAGNOSIS — K319 Disease of stomach and duodenum, unspecified: Secondary | ICD-10-CM | POA: Diagnosis present

## 2021-10-24 DIAGNOSIS — R109 Unspecified abdominal pain: Secondary | ICD-10-CM | POA: Diagnosis not present

## 2021-10-24 DIAGNOSIS — F32A Depression, unspecified: Secondary | ICD-10-CM | POA: Diagnosis present

## 2021-10-24 DIAGNOSIS — M549 Dorsalgia, unspecified: Secondary | ICD-10-CM | POA: Diagnosis present

## 2021-10-24 DIAGNOSIS — Z885 Allergy status to narcotic agent status: Secondary | ICD-10-CM

## 2021-10-24 DIAGNOSIS — Z79899 Other long term (current) drug therapy: Secondary | ICD-10-CM

## 2021-10-24 DIAGNOSIS — G8929 Other chronic pain: Secondary | ICD-10-CM | POA: Diagnosis present

## 2021-10-24 DIAGNOSIS — Z9049 Acquired absence of other specified parts of digestive tract: Secondary | ICD-10-CM

## 2021-10-24 DIAGNOSIS — G40909 Epilepsy, unspecified, not intractable, without status epilepticus: Secondary | ICD-10-CM | POA: Diagnosis present

## 2021-10-24 DIAGNOSIS — Z96643 Presence of artificial hip joint, bilateral: Secondary | ICD-10-CM | POA: Diagnosis present

## 2021-10-24 DIAGNOSIS — K259 Gastric ulcer, unspecified as acute or chronic, without hemorrhage or perforation: Secondary | ICD-10-CM | POA: Diagnosis not present

## 2021-10-24 DIAGNOSIS — I10 Essential (primary) hypertension: Secondary | ICD-10-CM | POA: Diagnosis present

## 2021-10-24 DIAGNOSIS — Z811 Family history of alcohol abuse and dependence: Secondary | ICD-10-CM

## 2021-10-24 DIAGNOSIS — Z820 Family history of epilepsy and other diseases of the nervous system: Secondary | ICD-10-CM

## 2021-10-24 DIAGNOSIS — Z87891 Personal history of nicotine dependence: Secondary | ICD-10-CM

## 2021-10-24 DIAGNOSIS — F909 Attention-deficit hyperactivity disorder, unspecified type: Secondary | ICD-10-CM | POA: Diagnosis present

## 2021-10-24 LAB — COMPREHENSIVE METABOLIC PANEL
ALT: 13 U/L (ref 0–44)
AST: 16 U/L (ref 15–41)
Albumin: 3.7 g/dL (ref 3.5–5.0)
Alkaline Phosphatase: 109 U/L (ref 38–126)
Anion gap: 8 (ref 5–15)
BUN: 10 mg/dL (ref 8–23)
CO2: 21 mmol/L — ABNORMAL LOW (ref 22–32)
Calcium: 8.9 mg/dL (ref 8.9–10.3)
Chloride: 111 mmol/L (ref 98–111)
Creatinine, Ser: 0.73 mg/dL (ref 0.44–1.00)
GFR, Estimated: >60 mL/min (ref >60)
Glucose, Bld: 104 mg/dL — ABNORMAL HIGH (ref 70–99)
Potassium: 3.3 mmol/L — ABNORMAL LOW (ref 3.5–5.1)
Sodium: 140 mmol/L (ref 135–145)
Total Bilirubin: 0.6 mg/dL (ref 0.3–1.2)
Total Protein: 6.8 g/dL (ref 6.5–8.1)

## 2021-10-24 LAB — CBC WITH DIFFERENTIAL/PLATELET
Abs Immature Granulocytes: 0.03 10*3/uL (ref 0.00–0.07)
Basophils Absolute: 0 10*3/uL (ref 0.0–0.1)
Basophils Relative: 1 %
Eosinophils Absolute: 0 10*3/uL (ref 0.0–0.5)
Eosinophils Relative: 1 %
HCT: 39 % (ref 36.0–46.0)
Hemoglobin: 12.1 g/dL (ref 12.0–15.0)
Immature Granulocytes: 0 %
Lymphocytes Relative: 9 %
Lymphs Abs: 0.7 10*3/uL (ref 0.7–4.0)
MCH: 28 pg (ref 26.0–34.0)
MCHC: 31 g/dL (ref 30.0–36.0)
MCV: 90.3 fL (ref 80.0–100.0)
Monocytes Absolute: 0.3 10*3/uL (ref 0.1–1.0)
Monocytes Relative: 4 %
Neutro Abs: 6.2 10*3/uL (ref 1.7–7.7)
Neutrophils Relative %: 85 %
Platelets: 369 10*3/uL (ref 150–400)
RBC: 4.32 MIL/uL (ref 3.87–5.11)
RDW: 15.1 % (ref 11.5–15.5)
WBC: 7.2 10*3/uL (ref 4.0–10.5)
nRBC: 0 % (ref 0.0–0.2)

## 2021-10-24 LAB — LIPASE, BLOOD: Lipase: 27 U/L (ref 11–51)

## 2021-10-24 MED ORDER — ONDANSETRON HCL 4 MG/2ML IJ SOLN
4.0000 mg | Freq: Four times a day (QID) | INTRAMUSCULAR | Status: DC | PRN
  Administered 2021-10-25 – 2021-10-27 (×6): 4 mg via INTRAVENOUS
  Filled 2021-10-24 (×6): qty 2

## 2021-10-24 MED ORDER — TRAMADOL HCL 50 MG PO TABS
50.0000 mg | ORAL_TABLET | Freq: Four times a day (QID) | ORAL | Status: DC | PRN
  Administered 2021-10-24 – 2021-10-25 (×2): 50 mg via ORAL
  Filled 2021-10-24 (×2): qty 1

## 2021-10-24 MED ORDER — FENTANYL CITRATE PF 50 MCG/ML IJ SOSY
50.0000 ug | PREFILLED_SYRINGE | Freq: Once | INTRAMUSCULAR | Status: AC
  Administered 2021-10-24: 50 ug via INTRAVENOUS
  Filled 2021-10-24: qty 1

## 2021-10-24 MED ORDER — ENOXAPARIN SODIUM 40 MG/0.4ML IJ SOSY
40.0000 mg | PREFILLED_SYRINGE | INTRAMUSCULAR | Status: DC
  Administered 2021-10-25: 40 mg via SUBCUTANEOUS
  Filled 2021-10-24: qty 0.4

## 2021-10-24 MED ORDER — TOPIRAMATE 100 MG PO TABS
200.0000 mg | ORAL_TABLET | Freq: Two times a day (BID) | ORAL | Status: DC
  Administered 2021-10-25 – 2021-10-30 (×11): 200 mg via ORAL
  Filled 2021-10-24 (×7): qty 2
  Filled 2021-10-24: qty 8
  Filled 2021-10-24 (×5): qty 2

## 2021-10-24 MED ORDER — HYDROMORPHONE HCL 1 MG/ML IJ SOLN
1.0000 mg | Freq: Once | INTRAMUSCULAR | Status: AC
  Administered 2021-10-24: 1 mg via INTRAVENOUS
  Filled 2021-10-24: qty 1

## 2021-10-24 MED ORDER — IOHEXOL 300 MG/ML  SOLN
100.0000 mL | Freq: Once | INTRAMUSCULAR | Status: AC | PRN
  Administered 2021-10-24: 100 mL via INTRAVENOUS

## 2021-10-24 MED ORDER — SUVOREXANT 20 MG PO TABS: 1.0000 | ORAL_TABLET | Freq: Every day | ORAL | Status: DC

## 2021-10-24 MED ORDER — ACETAMINOPHEN 500 MG PO TABS
1000.0000 mg | ORAL_TABLET | Freq: Four times a day (QID) | ORAL | Status: DC | PRN
  Administered 2021-10-25: 1000 mg via ORAL
  Filled 2021-10-24: qty 2

## 2021-10-24 MED ORDER — ONDANSETRON 4 MG PO TBDP
4.0000 mg | ORAL_TABLET | Freq: Four times a day (QID) | ORAL | Status: DC | PRN
  Administered 2021-10-28: 4 mg via ORAL
  Filled 2021-10-24: qty 1

## 2021-10-24 MED ORDER — CLONIDINE HCL 0.1 MG PO TABS
0.1000 mg | ORAL_TABLET | Freq: Two times a day (BID) | ORAL | Status: DC
  Administered 2021-10-24 – 2021-10-30 (×12): 0.1 mg via ORAL
  Filled 2021-10-24 (×12): qty 1

## 2021-10-24 MED ORDER — SODIUM CHLORIDE 0.9 % IV BOLUS
500.0000 mL | Freq: Once | INTRAVENOUS | Status: AC
  Administered 2021-10-24: 500 mL via INTRAVENOUS

## 2021-10-24 MED ORDER — ONDANSETRON HCL 4 MG/2ML IJ SOLN
4.0000 mg | Freq: Once | INTRAMUSCULAR | Status: AC
  Administered 2021-10-24: 4 mg via INTRAVENOUS
  Filled 2021-10-24: qty 2

## 2021-10-24 MED ORDER — AMPHETAMINE-DEXTROAMPHETAMINE 10 MG PO TABS
20.0000 mg | ORAL_TABLET | Freq: Two times a day (BID) | ORAL | Status: DC
  Administered 2021-10-26 – 2021-10-30 (×8): 20 mg via ORAL
  Filled 2021-10-24 (×9): qty 2

## 2021-10-24 MED ORDER — POTASSIUM CHLORIDE IN NACL 20-0.9 MEQ/L-% IV SOLN
INTRAVENOUS | Status: DC
  Filled 2021-10-24 (×8): qty 1000

## 2021-10-24 MED ORDER — POLYETHYLENE GLYCOL 3350 17 G PO PACK: 17.0000 g | PACK | Freq: Every day | ORAL | Status: DC | PRN

## 2021-10-24 MED ORDER — HYDROMORPHONE HCL 1 MG/ML IJ SOLN
0.5000 mg | INTRAMUSCULAR | Status: DC | PRN
  Administered 2021-10-25 – 2021-10-26 (×6): 0.5 mg via INTRAVENOUS
  Filled 2021-10-24: qty 0.5
  Filled 2021-10-24 (×2): qty 1
  Filled 2021-10-24 (×2): qty 0.5
  Filled 2021-10-24: qty 1

## 2021-10-24 MED ORDER — SUVOREXANT 20 MG PO TABS
1.0000 | ORAL_TABLET | Freq: Every day | ORAL | Status: DC
  Filled 2021-10-24: qty 1

## 2021-10-24 NOTE — ED Provider Triage Note (Signed)
Emergency Medicine Provider Triage Evaluation Note ? ?Kelli Navarro , a 71 y.o. female  was evaluated in triage.  Pt complains of sharp abdominal pain from the epigastric to right upper quadrant regions.  Patient states that she had cholecystectomy on April 13.  Patient had increased pain today and was evaluated by her surgeon's office who recommended that she come to the emergency department for laboratory work and CT to evaluate for bile leak.  Denies chest pain, shortness of breath.  Endorses abdominal pain ? ?Review of Systems  ?Positive: Abdominal pain ?Negative: Shortness of breath chest pain ? ?Physical Exam  ?BP (!) 152/89 (BP Location: Left Arm)   Pulse 90   Temp 97.6 ?F (36.4 ?C) (Oral)   Resp 16   SpO2 100%  ?Gen:   Awake, no distress   ?Resp:  Normal effort  ?MSK:   Moves extremities without difficulty  ?Other:   ? ?Medical Decision Making  ?Medically screening exam initiated at 3:44 PM.  Appropriate orders placed.  GARLENE APPERSON was informed that the remainder of the evaluation will be completed by another provider, this initial triage assessment does not replace that evaluation, and the importance of remaining in the ED until their evaluation is complete. ? ? ?  ?Dorothyann Peng, PA-C ?10/24/21 1545 ? ?

## 2021-10-24 NOTE — ED Provider Notes (Signed)
?[facility] ?Provider Note ? ? ?CSN: [id number] ?Arrival date & time: 10/24/21  1521 ? ?  ? ?History ? ?Chief Complaint  ?Patient presents with  ? Post-op Problem  ? ? ?Kelli Navarro is a 71 y.o. female. ? ?HPI ? ?72 year old female with past medical history of recent cholecystectomy on April 13 with Dr. Zenia Resides presents emergency department for right upper quadrant abdominal pain.  She was reportedly evaluated at the surgeon's office who referred her here to the emergency department for CT of the abdomen and pelvis.  Patient states her initial postoperative course was uncomplicated.  She then developed abdominal pain that was severely worsened today, associated with nausea.  No documented fever.  No diarrhea, no abdominal distention. ? ?Home Medications ?Prior to Admission medications   ?Medication Sig Start Date End Date Taking? Authorizing Provider  ?acetaminophen (TYLENOL) 500 MG tablet Take 1,000 mg by mouth every 6 (six) hours as needed for moderate pain or headache (pain).    [provider]  ?amphetamine-dextroamphetamine (ADDERALL) 20 MG tablet Take 1 tablet (20 mg total) by mouth 2 (two) times daily. May take an additional tablet as needed 09/18/21   Thayer Headings, PMHNP  ?cloNIDine (CATAPRES) 0.1 MG tablet Take 1 tablet (0.1 mg total) by mouth 2 (two) times daily. 09/05/21   Thayer Headings, [place]  ?[facility] ASPIRIN ADULT LOW DOSE 81 MG chewable tablet Chew 81 mg by mouth daily. 05/09/21   [provider]  ?ipratropium (ATROVENT) 0.06 % nasal spray Place 2 sprays into both nostrils daily. 10/11/19   [provider]  ?Multiple Vitamin (MULTIVITAMIN WITH MINERALS) TABS tablet Take 1 tablet by mouth daily.    [provider]  ?polyethylene glycol (MIRALAX / GLYCOLAX) 17 g packet Take 17 g by mouth daily as needed for mild constipation. 10/18/21   Shelly Coss, MD  ?Suvorexant (BELSOMRA) 20 MG TABS Take 20 mg by mouth at bedtime. 08/14/21  10/17/21  Thayer Headings, [place]  ?topiramate (TOPAMAX) 100 MG tablet Take 2 tabs twice a day ?Patient taking differently: Take 200 mg by mouth 2 (two) times daily. 02/15/21   Cameron Sprang, MD  ?traMADol (ULTRAM) 50 MG tablet Take 1 tablet (50 mg total) by mouth every 6 (six) hours as needed for severe pain. 10/18/21   Meuth, Blaine Hamper, PA-C  ?   ? ?Allergies    ?Other, Aptiom [eslicarbazepine], Azithromycin, Bupropion, Carbamazepine, Dilantin [phenytoin sodium extended], Diphenhydramine, Divalproex sodium, Keppra [levetiracetam], Lamictal [lamotrigine], Lorazepam, Lyrica [pregabalin], Morphine and related, Prednisone, and Vimpat [lacosamide]   ? ?Review of Systems   ?Review of Systems  ?Constitutional:  Positive for appetite change, chills and fatigue. Negative for fever.  ?Respiratory:  Negative for shortness of breath.   ?Cardiovascular:  Negative for chest pain.  ?Gastrointestinal:  Positive for abdominal pain and nausea. Negative for diarrhea and vomiting.  ?Skin:  Negative for rash.  ?Neurological:  Negative for headaches.  ? ?Physical Exam ?Updated Vital Signs ?BP (!) 139/122   Pulse 83   Temp 97.6 ?F (36.4 ?C) (Oral)   Resp (!) 24   SpO2 98%  ?Physical Exam ?Vitals and nursing note reviewed.  ?Constitutional:   ?   General: She is not in acute distress. ?   Appearance: Normal appearance.  ?HENT:  ?   Head: Normocephalic.  ?   Mouth/Throat:  ?   Mouth: Mucous membranes are moist.  ?Cardiovascular:  ?   Rate and Rhythm: Normal rate.  ?Pulmonary:  ?  Effort: Pulmonary effort is normal. No respiratory distress.  ?Abdominal:  ?   General: Bowel sounds are normal.  ?   Palpations: Abdomen is soft.  ?   Tenderness: There is abdominal tenderness. There is guarding. There is no rebound.  ?Skin: ?   General: Skin is warm.  ?Neurological:  ?   Mental Status: She is alert and oriented to person, place, and time. Mental status is at baseline.  ?Psychiatric:     ?   Mood and Affect: Mood normal.  ? ? ?ED Results /  Procedures / Treatments   ?Labs ?(all labs ordered are listed, but only abnormal results are displayed) ?Labs Reviewed  ?COMPREHENSIVE METABOLIC PANEL - Abnormal; Notable for the following components:  ?    Result Value  ? Potassium 3.3 (*)   ? CO2 21 (*)   ? Glucose, Bld 104 (*)   ? All other components within normal limits  ?LIPASE, BLOOD  ?CBC WITH DIFFERENTIAL/PLATELET  ? ? ?EKG ?None ? ?Radiology ?No results found. ? ?Procedures ?Procedures  ? ? ?Medications Ordered in ED ?Medications  ?fentaNYL (SUBLIMAZE) injection 50 mcg (50 mcg Intravenous Given 10/24/21 1727)  ?ondansetron [facility]) injection 4 mg (4 mg Intravenous Given 10/24/21 1727)  ?sodium chloride 0.9 % bolus 500 mL (500 mLs Intravenous New Bag/Given 10/24/21 1727)  ? ? ?ED Course/ Medical Decision Making/ A&P ?  ?                        ?Medical Decision Making ?Risk ?Prescription drug management. ?Decision regarding hospitalization. ? ? ? ?71 year old female presents emergency department for upper abdominal pain, postop 1 week from a cholecystectomy.  Vital signs are stable on arrival, she is afebrile, abdomen is tender but benign. ? ?Blood work is reassuring. No leukocytosis. LFTs and bilirubin are normal. Pain continues to require IV medicine. CT of the abdomen and pelvis shows no abdominal fluid collection but a dilated bile duct.  Dr. Donne Hazel had initially evaluated the patient and Dr. Grandville Silos has reviewed the CT and will admit for observation and symptom control. Patient taken to inpatient room prior to my last re evaluation but vitals stable at time of transfer. ? ?Patients evaluation and results requires admission for further treatment and care.  Patient is stable/unchanged at time of admit. ? ? ? ? ? ? ? ?Final Clinical Impression(s) / ED Diagnoses ?Final diagnoses:  ?None  ? ? ?Rx / DC Orders ?ED Discharge Orders   ? ? None  ? ?  ? ? ?  ?Lorelle Gibbs, DO ?10/24/21 2221 ? ?

## 2021-10-24 NOTE — Progress Notes (Signed)
Patient ID: Kelli Navarro, female   DOB: 12-18-1950, 71 y.o.   MRN: [id number] ?CT reviewed with the radiologist. No significant acute findings. She is still having episodes of pain. Will admit for obs and repeat labs in AM. ? ?Georganna Skeans, MD, MPH, FACS ?Please use [email or web address] to contact on call provider ? ?

## 2021-10-24 NOTE — H&P (Signed)
? ? ? ?Kelli Navarro ?12-Jul-1950  ?[id number].   ? ?Requesting MD: Dr. Lavenia Atlas  ?Chief Complaint/Reason for Consult: Abdominal pain ? ?HPI: Kelli Navarro is a 71 y.o. female who is s/p Laparoscopic Cholecystectomy by Dr. Zenia Resides on 4/13 for Cholelithiasis (IOC negative) who presented to the ED on 4/20 for abdominal pain. Patient reports she started having sharp, stabbing, constant RUQ abdominal pain last night rated as a 8-9/10, unrelieved by tylenol or tramadol. She called the office and was seen in urgent office on 4/20. She was sent to the ED for CT A/P.  ? ?ROS: ?ROS ? ?Family History  ?Problem Relation Age of Onset  ? Multiple sclerosis Mother   ? Heart Problems Mother   ?     by-pass  ? Alcohol abuse Mother   ? Heart Problems Sister   ?     pacemaker  ? ? ?Past Medical History:  ?Diagnosis Date  ? ADHD   ? Anxiety   ? Chronic back pain   ? from broken back  ? Depression   ? Right femoral fracture ([place])   ? [facility] spotted fever   ? may show low Hgb  ? Seizures ([place])   ? has during sleep  ? Vitamin B 12 deficiency   ? ? ?Past Surgical History:  ?Procedure Laterality Date  ? BACK SURGERY    ? CHOLECYSTECTOMY N/A 10/17/2021  ? Procedure: LAPAROSCOPIC CHOLECYSTECTOMY WITH INTRAOPERATIVE CHOLANGIOGRAM;  Surgeon: Dwan Bolt, MD;  Location: [place];  Service: General;  Laterality: N/A;  ? none    ? radial frequency ablasion  05/2019  ? back  ? 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 Procedure: TOTAL HIP ARTHROPLASTY ANTERIOR APPROACH;  Surgeon: Rod Can, MD;  Location: [facility];  Service: Orthopedics;  Laterality: Left;  ? TOTAL HIP ARTHROPLASTY Right 08/16/2020  ? Procedure: TOTAL HIP ARTHROPLASTY ANTERIOR APPROACH;  Surgeon: Rod Can, MD;  Location: [facility];  Service: Orthopedics;  Laterality: Right;  ? ? ?Social History:  reports that she has quit smoking. Her smoking use included cigarettes. She has a 0.50 pack-year smoking history. She has never used smokeless tobacco. She  reports that she does not drink alcohol and does not use drugs. ? ?Allergies:  ?Allergies  ?Allergen Reactions  ? Other Anaphylaxis  ?  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 Aptiom [Eslicarbazepine] Other (See Comments)  ?  Unknown reaction  ? Azithromycin Other (See Comments)  ?  Unknown reaction ?  ? Bupropion Other (See Comments)  ?   Caused depression  ? Carbamazepine Other (See Comments)  ?  Unknown reaction ?  ? Dilantin [Phenytoin Sodium Extended] Other (See Comments)  ?  Unknown reaction ?  ? Diphenhydramine Other (See Comments)  ?  Feels like "spiders are crawling" on her  ? Divalproex Sodium Other (See Comments)  ?  Unknown reaction ?  ? Keppra [Levetiracetam] Other (See Comments)  ?  Unknown reaction ?  ? Lamictal [Lamotrigine] Other (See Comments)  ?  Unknown reaction ?  ? Lorazepam Swelling and Other (See Comments)  ?  Hallucinations  ? Lyrica [Pregabalin] Other (See Comments)  ?  Pt reports feeling loopy and confused  ? Morphine And Related Other (See Comments)  ?  Unknown reaction - (high doses)  ? Prednisone Other (See Comments)  ?  Unknown reaction ?  ? Vimpat [Lacosamide] Other (See Comments)  ?  Unknown reaction ?  ? ? ?(Not in a hospital  admission) ? ? ? ?Physical Exam: ?Blood pressure (!) 152/89, pulse 90, temperature 97.6 ?F (36.4 ?C), temperature source Oral, resp. rate 16, SpO2 100 %. ?General: pleasant, WD/WN female who is laying in bed in NAD ?HEENT: head is normocephalic, atraumatic.  Sclera are noninjected.  PERRL.  Ears and nose without any masses or lesions.  Mouth is pink and moist. Dentition fair ?Heart: regular, rate, and rhythm.   ?Lungs: CTAB, no wheezes, rhonchi, or rales noted.  Respiratory effort nonlabored ?Abd:  Soft, ND, RUQ minimal abdominal tenderness. Incisions with glue intact appears well and are without drainage, bleeding, or signs of infection ?MS: no BLE edema ?Skin: warm and dry with no masses, lesions, or rashes ?Psych: A&Ox4 with an appropriate  affect ?Neuro: cranial nerves grossly intact, normal speech, thought process intact, moves all extremities ? ?Results for orders placed or performed during the hospital encounter of 10/24/21 (from the past 48 hour(s))  ?CBC with Differential     Status: None  ? Collection Time: 10/24/21  3:45 PM  ?Result Value Ref Range  ? WBC 7.2 4.0 - 10.5 K/uL  ? RBC 4.32 3.87 - 5.11 MIL/uL  ? Hemoglobin 12.1 12.0 - 15.0 g/dL  ? HCT 39.0 36.0 - 46.0 %  ? MCV 90.3 80.0 - 100.0 fL  ? MCH 28.0 26.0 - 34.0 pg  ? MCHC 31.0 30.0 - 36.0 g/dL  ? RDW 15.1 11.5 - 15.5 %  ? Platelets 369 150 - 400 K/uL  ? nRBC 0.0 0.0 - 0.2 %  ? Neutrophils Relative % 85 %  ? Neutro Abs 6.2 1.7 - 7.7 K/uL  ? Lymphocytes Relative 9 %  ? Lymphs Abs 0.7 0.7 - 4.0 K/uL  ? Monocytes Relative 4 %  ? Monocytes Absolute 0.3 0.1 - 1.0 K/uL  ? Eosinophils Relative 1 %  ? Eosinophils Absolute 0.0 0.0 - 0.5 K/uL  ? Basophils Relative 1 %  ? Basophils Absolute 0.0 0.0 - 0.1 K/uL  ? Immature Granulocytes 0 %  ? Abs Immature Granulocytes 0.03 0.00 - 0.07 K/uL  ?  Comment: Performed at [facility], [place] [address], [place], [place] [address]  ? ?No results found. ? ?Anti-infectives (From admission, onward)  ? ? None  ? ?  ? ? ?Assessment/Plan ?Post operative pain ?POD 7 s/p Laparoscopic Cholecystectomy with IOC by Dr. Zenia Resides on 4/13 for Cholelithiasis (IOC negative)  ?- CT A/P and labs to rule out post operative fluid collection. If CT identifies one, would recommend IR drainage and based on characteristic of fluid may need additional imaging ([facility]) to r/o bile leak. ?-will make determination after rest of labs and imaging done ? ?FEN - NPO ?VTE - SCDs ?ID - Zosyn ? ?I reviewed ED provider notes, last 24 h vitals and pain scores, last 48 h intake and output, last 24 h labs and trends, and last 24 h imaging results.I reviewed labs ? ?This care required moderate level of medical decision making.  ? ? ? ?

## 2021-10-24 NOTE — ED Triage Notes (Signed)
Patient here with complaint of sharp abdominal pain, sent to ED by surgeon, history of cholecystectomy one week ago, here for CT abdomen to evaluate for bile leak. Patient is alert, oriented, and in no apparent distress at this time. ?

## 2021-10-25 ENCOUNTER — Inpatient Hospital Stay (HOSPITAL_COMMUNITY): Payer: Medicare HMO

## 2021-10-25 DIAGNOSIS — Z79899 Other long term (current) drug therapy: Secondary | ICD-10-CM | POA: Diagnosis not present

## 2021-10-25 DIAGNOSIS — K259 Gastric ulcer, unspecified as acute or chronic, without hemorrhage or perforation: Secondary | ICD-10-CM | POA: Diagnosis present

## 2021-10-25 DIAGNOSIS — R109 Unspecified abdominal pain: Secondary | ICD-10-CM | POA: Diagnosis present

## 2021-10-25 DIAGNOSIS — Z888 Allergy status to other drugs, medicaments and biological substances status: Secondary | ICD-10-CM | POA: Diagnosis not present

## 2021-10-25 DIAGNOSIS — Z96643 Presence of artificial hip joint, bilateral: Secondary | ICD-10-CM | POA: Diagnosis present

## 2021-10-25 DIAGNOSIS — G8929 Other chronic pain: Secondary | ICD-10-CM | POA: Diagnosis present

## 2021-10-25 DIAGNOSIS — Z87891 Personal history of nicotine dependence: Secondary | ICD-10-CM | POA: Diagnosis not present

## 2021-10-25 DIAGNOSIS — G40909 Epilepsy, unspecified, not intractable, without status epilepticus: Secondary | ICD-10-CM | POA: Diagnosis present

## 2021-10-25 DIAGNOSIS — Z9049 Acquired absence of other specified parts of digestive tract: Secondary | ICD-10-CM | POA: Diagnosis not present

## 2021-10-25 DIAGNOSIS — R1010 Upper abdominal pain, unspecified: Secondary | ICD-10-CM

## 2021-10-25 DIAGNOSIS — I10 Essential (primary) hypertension: Secondary | ICD-10-CM | POA: Diagnosis present

## 2021-10-25 DIAGNOSIS — F419 Anxiety disorder, unspecified: Secondary | ICD-10-CM | POA: Diagnosis present

## 2021-10-25 DIAGNOSIS — K253 Acute gastric ulcer without hemorrhage or perforation: Secondary | ICD-10-CM | POA: Diagnosis not present

## 2021-10-25 DIAGNOSIS — F418 Other specified anxiety disorders: Secondary | ICD-10-CM | POA: Diagnosis not present

## 2021-10-25 DIAGNOSIS — Z811 Family history of alcohol abuse and dependence: Secondary | ICD-10-CM | POA: Diagnosis not present

## 2021-10-25 DIAGNOSIS — K319 Disease of stomach and duodenum, unspecified: Secondary | ICD-10-CM | POA: Diagnosis present

## 2021-10-25 DIAGNOSIS — M549 Dorsalgia, unspecified: Secondary | ICD-10-CM | POA: Diagnosis present

## 2021-10-25 DIAGNOSIS — Z820 Family history of epilepsy and other diseases of the nervous system: Secondary | ICD-10-CM | POA: Diagnosis not present

## 2021-10-25 DIAGNOSIS — Z9889 Other specified postprocedural states: Secondary | ICD-10-CM | POA: Diagnosis not present

## 2021-10-25 DIAGNOSIS — F32A Depression, unspecified: Secondary | ICD-10-CM | POA: Diagnosis present

## 2021-10-25 DIAGNOSIS — Z885 Allergy status to narcotic agent status: Secondary | ICD-10-CM | POA: Diagnosis not present

## 2021-10-25 DIAGNOSIS — F909 Attention-deficit hyperactivity disorder, unspecified type: Secondary | ICD-10-CM | POA: Diagnosis present

## 2021-10-25 LAB — CBC
HCT: 36.8 % (ref 36.0–46.0)
Hemoglobin: 11.4 g/dL — ABNORMAL LOW (ref 12.0–15.0)
MCH: 28.1 pg (ref 26.0–34.0)
MCHC: 31 g/dL (ref 30.0–36.0)
MCV: 90.9 fL (ref 80.0–100.0)
Platelets: 335 10*3/uL (ref 150–400)
RBC: 4.05 MIL/uL (ref 3.87–5.11)
RDW: 15.3 % (ref 11.5–15.5)
WBC: 6 10*3/uL (ref 4.0–10.5)
nRBC: 0 % (ref 0.0–0.2)

## 2021-10-25 LAB — PROTIME-INR
INR: 1 (ref 0.8–1.2)
Prothrombin Time: 13 seconds (ref 11.4–15.2)

## 2021-10-25 LAB — TROPONIN I (HIGH SENSITIVITY): Troponin I (High Sensitivity): 6 ng/L (ref <18)

## 2021-10-25 LAB — COMPREHENSIVE METABOLIC PANEL
ALT: 10 U/L (ref 0–44)
AST: 13 U/L — ABNORMAL LOW (ref 15–41)
Albumin: 3 g/dL — ABNORMAL LOW (ref 3.5–5.0)
Alkaline Phosphatase: 94 U/L (ref 38–126)
Anion gap: 5 (ref 5–15)
BUN: 7 mg/dL — ABNORMAL LOW (ref 8–23)
CO2: 20 mmol/L — ABNORMAL LOW (ref 22–32)
Calcium: 8.4 mg/dL — ABNORMAL LOW (ref 8.9–10.3)
Chloride: 114 mmol/L — ABNORMAL HIGH (ref 98–111)
Creatinine, Ser: 0.68 mg/dL (ref 0.44–1.00)
GFR, Estimated: >60 mL/min (ref >60)
Glucose, Bld: 77 mg/dL (ref 70–99)
Potassium: 3.3 mmol/L — ABNORMAL LOW (ref 3.5–5.1)
Sodium: 139 mmol/L (ref 135–145)
Total Bilirubin: 0.5 mg/dL (ref 0.3–1.2)
Total Protein: 5.9 g/dL — ABNORMAL LOW (ref 6.5–8.1)

## 2021-10-25 MED ORDER — GADOBUTROL 1 MMOL/ML IV SOLN
5.7000 mL | Freq: Once | INTRAVENOUS | Status: AC | PRN
  Administered 2021-10-25: 5.7 mL via INTRAVENOUS

## 2021-10-25 MED ORDER — ENOXAPARIN SODIUM 40 MG/0.4ML IJ SOSY
40.0000 mg | PREFILLED_SYRINGE | INTRAMUSCULAR | Status: DC
  Administered 2021-10-26 – 2021-10-30 (×5): 40 mg via SUBCUTANEOUS
  Filled 2021-10-25 (×5): qty 0.4

## 2021-10-25 MED ORDER — PANTOPRAZOLE SODIUM 40 MG IV SOLR
40.0000 mg | Freq: Two times a day (BID) | INTRAVENOUS | Status: DC
  Administered 2021-10-25 – 2021-10-26 (×3): 40 mg via INTRAVENOUS
  Filled 2021-10-25 (×3): qty 10

## 2021-10-25 NOTE — ED Notes (Signed)
GI at bedside

## 2021-10-25 NOTE — Consult Note (Signed)
? ? ?Consultation ? ?Referring Provider:  [facility] ?Primary Care Physician:  Skeet Latch, NP ?Primary Gastroenterologist:  Johny Shears recent admit ? ?Reason for Consultation: Acute abdominal pain ? ?HPI: Kelli Navarro is a 71 y.o. female who was readmitted to the hospital yesterday after being seen in the surgery office for complaints of acute abdominal pain.  Patient had very recently been admitted on 10/17/2021 with acute abdominal pain was seen by surgery and GI at that time, underwent MRI/MRCP that confirmed a 1.2 cm gallstone in the gallbladder, a common bile duct of 8.5 mm with a 4 mm defect in the the distal common bile duct concerning for CBD stone.  She had normal LFTs. ?Decision was made to proceed with laparoscopic cholecystectomy, which she underwent including IOC which was negative.  LFTs were normal at the time of discharge. ?She says she did well postoperatively until onset at about midnight on Wednesday evening with acute very severe upper abdominal pain located in the epigastrium/right upper quadrant and radiating through to her back.  She says this was much worse than the initial pain she had when she presented to the hospital a little over a week ago.  Pain has been constant and unrelenting since, associated with nausea no vomiting.  She has not had any documented fever but has been having some chills off and on.  No diarrhea melena or hematochezia has been somewhat constipated since surgery. ? ?She had CT of the abdomen and pelvis with contrast yesterday showing a common bile duct of 13 mm no definite stone seen, there is also mild intra hepatic ductal dilation, no fluid collection in the gallbladder fossa, small amount of fluid in the pelvis and a nonobstructing left renal stone. ? ?Labs on admission lipase 27, WBC 7.2/hemoglobin 12.1/LFTs within normal limits/potassium 3.3 ? ? ?Today troponin negative x1 ?WBC 6.0/hemoglobin 11.4/hematocrit 36.8 ?Potassium 3.3 ?LFTs  remain normal. ? ?Her pain is persisting but controlled with narcotics at present. ? ? ?Past Medical History:  ?Diagnosis Date  ? ADHD   ? Anxiety   ? Chronic back pain   ? from broken back  ? Depression   ? Right femoral fracture ([place])   ? [facility] spotted fever   ? may show low Hgb  ? Seizures ([place])   ? has during sleep  ? Vitamin B 12 deficiency   ? ? ?Past Surgical History:  ?Procedure Laterality Date  ? BACK SURGERY    ? CHOLECYSTECTOMY N/A 10/17/2021  ? Procedure: LAPAROSCOPIC CHOLECYSTECTOMY WITH INTRAOPERATIVE CHOLANGIOGRAM;  Surgeon: Dwan Bolt, MD;  Location: [place];  Service: General;  Laterality: N/A;  ? none    ? radial frequency ablasion  05/2019  ? back  ? 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 Procedure: TOTAL HIP ARTHROPLASTY ANTERIOR APPROACH;  Surgeon: Rod Can, MD;  Location: [facility];  Service: Orthopedics;  Laterality: Left;  ? TOTAL HIP ARTHROPLASTY Right 08/16/2020  ? Procedure: TOTAL HIP ARTHROPLASTY ANTERIOR APPROACH;  Surgeon: Rod Can, MD;  [facility];  Service: Orthopedics;  Laterality: Right;  ? ? ?Prior to Admission medications   ?Medication Sig Start Date End Date Taking? Authorizing Provider  ?acetaminophen (TYLENOL) 500 MG tablet Take 1,000 mg by mouth every 6 (six) hours as needed for moderate pain or headache (pain).   Yes [provider]  ?amphetamine-dextroamphetamine (ADDERALL) 20 MG tablet Take 1 tablet (20 mg total) by mouth 2 (two) times daily. May take an additional tablet as needed 09/18/21  Yes Thayer Headings, PMHNP  ?cloNIDine (CATAPRES) 0.1 MG tablet Take 1 tablet (0.1 mg total) by mouth 2 (two) times daily. 09/05/21  Yes Thayer Headings, [place]  ?[facility] ASPIRIN ADULT LOW DOSE 81 MG chewable tablet Chew 81 mg by mouth daily. 05/09/21  Yes [provider]  ?ipratropium (ATROVENT) 0.06 % nasal spray Place 2 sprays into both nostrils daily as needed (allergies). 10/11/19  Yes [provider]  ?Multiple Vitamin (MULTIVITAMIN WITH  MINERALS) TABS tablet Take 1 tablet by mouth daily.   Yes [provider]  ?polyethylene glycol (MIRALAX / GLYCOLAX) 17 g packet Take 17 g by mouth daily as needed for mild constipation. 10/18/21  Yes Shelly Coss, MD  ?Suvorexant (BELSOMRA) 20 MG TABS Take 1 tablet by mouth at bedtime.   Yes [provider]  ?topiramate (TOPAMAX) 100 MG tablet Take 2 tabs twice a day ?Patient taking differently: Take 200 mg by mouth 2 (two) times daily. 02/15/21  Yes Cameron Sprang, MD  ?traMADol (ULTRAM) 50 MG tablet Take 1 tablet (50 mg total) by mouth every 6 (six) hours as needed for severe pain. 10/18/21  Yes Meuth, Blaine Hamper, PA-C  ? ? ?Current Facility-Administered Medications  ?Medication Dose Route Frequency Provider Last Rate Last Admin  ? 0.9 % NaCl with KCl 20 mEq/ L  infusion   Intravenous Continuous Georganna Skeans, MD 75 mL/hr at 10/25/21 0937 Rate Verify at 10/25/21 0937  ? acetaminophen (TYLENOL) tablet 1,000 mg  1,000 mg Oral Q6H PRN Georganna Skeans, MD   1,000 mg at 10/25/21 1000  ? amphetamine-dextroamphetamine (ADDERALL) tablet 20 mg  20 mg Oral BID Georganna Skeans, MD      ? cloNIDine (CATAPRES) tablet 0.1 mg  0.1 mg Oral BID Georganna Skeans, MD   0.1 mg at 10/25/21 1000  ? enoxaparin (LOVENOX) injection 40 mg  40 mg Subcutaneous Q24H Georganna Skeans, MD   40 mg at 10/25/21 8016  ? HYDROmorphone (DILAUDID) injection 0.5 mg  0.5 mg Intravenous Q3H PRN Georganna Skeans, MD   0.5 mg at 10/25/21 1129  ? ondansetron (ZOFRAN-ODT) disintegrating tablet 4 mg  4 mg Oral Q6H PRN Georganna Skeans, MD      ? Or  ? ondansetron [facility]) injection 4 mg  4 mg Intravenous Q6H PRN Georganna Skeans, MD   4 mg at 10/25/21 1129  ? polyethylene glycol (MIRALAX / GLYCOLAX) packet 17 g  17 g Oral Daily PRN Georganna Skeans, MD      ? Suvorexant TABS 1 tablet  1 tablet Oral QHS Georganna Skeans, MD      ? topiramate (TOPAMAX) tablet 200 mg  200 mg Oral BID Georganna Skeans, MD   200 mg at 10/25/21 0525  ? traMADol (ULTRAM)  tablet 50 mg  50 mg Oral Q6H PRN Georganna Skeans, MD   50 mg at 10/24/21 2327  ? ?Current Outpatient Medications  ?Medication Sig Dispense Refill  ? acetaminophen (TYLENOL) 500 MG tablet Take 1,000 mg by mouth every 6 (six) hours as needed for moderate pain or headache (pain).    ? amphetamine-dextroamphetamine (ADDERALL) 20 MG tablet Take 1 tablet (20 mg total) by mouth 2 (two) times daily. May take an additional tablet as needed 75 tablet 0  ? cloNIDine (CATAPRES) 0.1 MG tablet Take 1 tablet (0.1 mg total) by mouth 2 (two) times daily. 180 tablet 0  ? [facility] ASPIRIN ADULT LOW DOSE 81 MG chewable tablet Chew 81 mg by mouth daily.    ? ipratropium (ATROVENT) 0.06 %  nasal spray Place 2 sprays into both nostrils daily as needed (allergies).    ? Multiple Vitamin (MULTIVITAMIN WITH MINERALS) TABS tablet Take 1 tablet by mouth daily.    ? polyethylene glycol (MIRALAX / GLYCOLAX) 17 g packet Take 17 g by mouth daily as needed for mild constipation. 14 each 0  ? Suvorexant (BELSOMRA) 20 MG TABS Take 1 tablet by mouth at bedtime.    ? topiramate (TOPAMAX) 100 MG tablet Take 2 tabs twice a day (Patient taking differently: Take 200 mg by mouth 2 (two) times daily.) 360 tablet 3  ? traMADol (ULTRAM) 50 MG tablet Take 1 tablet (50 mg total) by mouth every 6 (six) hours as needed for severe pain. 15 tablet 0  ? ? ?Allergies as of 10/24/2021 - Review Complete 10/24/2021  ?Allergen Reaction Noted  ? Hydrocortisone Anaphylaxis 10/24/2021  ? Other Anaphylaxis 03/21/2017  ? Aptiom [eslicarbazepine] Other (See Comments) 12/29/2014  ? Azithromycin Other (See Comments) 06/08/2018  ? Bupropion Other (See Comments) 12/29/2014  ? Carbamazepine Other (See Comments) 06/08/2018  ? Dilantin [phenytoin sodium extended] Other (See Comments) 12/29/2014  ? Diphenhydramine Other (See Comments) 12/29/2014  ? Divalproex sodium Other (See Comments) 12/29/2014  ? Keppra [levetiracetam] Other (See Comments) 12/29/2014  ? Lamictal [lamotrigine] Other (See  Comments) 12/29/2014  ? Lorazepam Swelling and Other (See Comments) 01/02/2017  ? Lyrica [pregabalin] Other (See Comments) 12/29/2014  ? Morphine and related Other (See Comments) 12/29/2014  ? Pred

## 2021-10-25 NOTE — H&P (View-Only) (Signed)
? ? ?Consultation ? ?Referring Provider:  [facility] ?Primary Care Physician:  Skeet Latch, NP ?Primary Gastroenterologist:  Johny Shears recent admit ? ?Reason for Consultation: Acute abdominal pain ? ?HPI: Kelli Navarro is a 71 y.o. female who was readmitted to the hospital yesterday after being seen in the surgery office for complaints of acute abdominal pain.  Patient had very recently been admitted on 10/17/2021 with acute abdominal pain was seen by surgery and GI at that time, underwent MRI/MRCP that confirmed a 1.2 cm gallstone in the gallbladder, a common bile duct of 8.5 mm with a 4 mm defect in the the distal common bile duct concerning for CBD stone.  She had normal LFTs. ?Decision was made to proceed with laparoscopic cholecystectomy, which she underwent including IOC which was negative.  LFTs were normal at the time of discharge. ?She says she did well postoperatively until onset at about midnight on Wednesday evening with acute very severe upper abdominal pain located in the epigastrium/right upper quadrant and radiating through to her back.  She says this was much worse than the initial pain she had when she presented to the hospital a little over a week ago.  Pain has been constant and unrelenting since, associated with nausea no vomiting.  She has not had any documented fever but has been having some chills off and on.  No diarrhea melena or hematochezia has been somewhat constipated since surgery. ? ?She had CT of the abdomen and pelvis with contrast yesterday showing a common bile duct of 13 mm no definite stone seen, there is also mild intra hepatic ductal dilation, no fluid collection in the gallbladder fossa, small amount of fluid in the pelvis and a nonobstructing left renal stone. ? ?Labs on admission lipase 27, WBC 7.2/hemoglobin 12.1/LFTs within normal limits/potassium 3.3 ? ? ?Today troponin negative x1 ?WBC 6.0/hemoglobin 11.4/hematocrit 36.8 ?Potassium 3.3 ?LFTs  remain normal. ? ?Her pain is persisting but controlled with narcotics at present. ? ? ?Past Medical History:  ?Diagnosis Date  ? ADHD   ? Anxiety   ? Chronic back pain   ? from broken back  ? Depression   ? Right femoral fracture ([place])   ? [facility] spotted fever   ? may show low Hgb  ? Seizures ([place])   ? has during sleep  ? Vitamin B 12 deficiency   ? ? ?Past Surgical History:  ?Procedure Laterality Date  ? BACK SURGERY    ? CHOLECYSTECTOMY N/A 10/17/2021  ? Procedure: LAPAROSCOPIC CHOLECYSTECTOMY WITH INTRAOPERATIVE CHOLANGIOGRAM;  Surgeon: Dwan Bolt, MD;  Location: [place];  Service: General;  Laterality: N/A;  ? none    ? radial frequency ablasion  05/2019  ? back  ? 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 Procedure: TOTAL HIP ARTHROPLASTY ANTERIOR APPROACH;  Surgeon: Rod Can, MD;  Location: [place];  Service: Orthopedics;  Laterality: Left;  ? TOTAL HIP ARTHROPLASTY Right 08/16/2020  ? Procedure: TOTAL HIP ARTHROPLASTY ANTERIOR APPROACH;  Surgeon: Rod Can, MD;  Location: [facility];  Service: Orthopedics;  Laterality: Right;  ? ? ?Prior to Admission medications   ?Medication Sig Start Date End Date Taking? Authorizing Provider  ?acetaminophen (TYLENOL) 500 MG tablet Take 1,000 mg by mouth every 6 (six) hours as needed for moderate pain or headache (pain).   Yes [provider]  ?amphetamine-dextroamphetamine (ADDERALL) 20 MG tablet Take 1 tablet (20 mg total) by mouth 2 (two) times daily. May take an additional tablet as needed 09/18/21  Yes Thayer Headings, PMHNP  ?cloNIDine (CATAPRES) 0.1 MG tablet Take 1 tablet (0.1 mg total) by mouth 2 (two) times daily. 09/05/21  Yes Thayer Headings, [place]  ?[facility] ASPIRIN ADULT LOW DOSE 81 MG chewable tablet Chew 81 mg by mouth daily. 05/09/21  Yes [provider]  ?ipratropium (ATROVENT) 0.06 % nasal spray Place 2 sprays into both nostrils daily as needed (allergies). 10/11/19  Yes [provider]  ?Multiple Vitamin (MULTIVITAMIN WITH  MINERALS) TABS tablet Take 1 tablet by mouth daily.   Yes [provider]  ?polyethylene glycol (MIRALAX / GLYCOLAX) 17 g packet Take 17 g by mouth daily as needed for mild constipation. 10/18/21  Yes Shelly Coss, MD  ?Suvorexant (BELSOMRA) 20 MG TABS Take 1 tablet by mouth at bedtime.   Yes [provider]  ?topiramate (TOPAMAX) 100 MG tablet Take 2 tabs twice a day ?Patient taking differently: Take 200 mg by mouth 2 (two) times daily. 02/15/21  Yes Cameron Sprang, MD  ?traMADol (ULTRAM) 50 MG tablet Take 1 tablet (50 mg total) by mouth every 6 (six) hours as needed for severe pain. 10/18/21  Yes Meuth, Blaine Hamper, PA-C  ? ? ?Current Facility-Administered Medications  ?Medication Dose Route Frequency Provider Last Rate Last Admin  ? 0.9 % NaCl with KCl 20 mEq/ L  infusion   Intravenous Continuous Georganna Skeans, MD 75 mL/hr at 10/25/21 0937 Rate Verify at 10/25/21 0937  ? acetaminophen (TYLENOL) tablet 1,000 mg  1,000 mg Oral Q6H PRN Georganna Skeans, MD   1,000 mg at 10/25/21 1000  ? amphetamine-dextroamphetamine (ADDERALL) tablet 20 mg  20 mg Oral BID Georganna Skeans, MD      ? cloNIDine (CATAPRES) tablet 0.1 mg  0.1 mg Oral BID Georganna Skeans, MD   0.1 mg at 10/25/21 1000  ? enoxaparin (LOVENOX) injection 40 mg  40 mg Subcutaneous Q24H Georganna Skeans, MD   40 mg at 10/25/21 1610  ? HYDROmorphone (DILAUDID) injection 0.5 mg  0.5 mg Intravenous Q3H PRN Georganna Skeans, MD   0.5 mg at 10/25/21 1129  ? ondansetron (ZOFRAN-ODT) disintegrating tablet 4 mg  4 mg Oral Q6H PRN Georganna Skeans, MD      ? Or  ? ondansetron [facility]) injection 4 mg  4 mg Intravenous Q6H PRN Georganna Skeans, MD   4 mg at 10/25/21 1129  ? polyethylene glycol (MIRALAX / GLYCOLAX) packet 17 g  17 g Oral Daily PRN Georganna Skeans, MD      ? Suvorexant TABS 1 tablet  1 tablet Oral QHS Georganna Skeans, MD      ? topiramate (TOPAMAX) tablet 200 mg  200 mg Oral BID Georganna Skeans, MD   200 mg at 10/25/21 0525  ? traMADol (ULTRAM)  tablet 50 mg  50 mg Oral Q6H PRN Georganna Skeans, MD   50 mg at 10/24/21 2327  ? ?Current Outpatient Medications  ?Medication Sig Dispense Refill  ? acetaminophen (TYLENOL) 500 MG tablet Take 1,000 mg by mouth every 6 (six) hours as needed for moderate pain or headache (pain).    ? amphetamine-dextroamphetamine (ADDERALL) 20 MG tablet Take 1 tablet (20 mg total) by mouth 2 (two) times daily. May take an additional tablet as needed 75 tablet 0  ? cloNIDine (CATAPRES) 0.1 MG tablet Take 1 tablet (0.1 mg total) by mouth 2 (two) times daily. 180 tablet 0  ? [facility] ASPIRIN ADULT LOW DOSE 81 MG chewable tablet Chew 81 mg by mouth daily.    ? ipratropium (ATROVENT) 0.06 %  nasal spray Place 2 sprays into both nostrils daily as needed (allergies).    ? Multiple Vitamin (MULTIVITAMIN WITH MINERALS) TABS tablet Take 1 tablet by mouth daily.    ? polyethylene glycol (MIRALAX / GLYCOLAX) 17 g packet Take 17 g by mouth daily as needed for mild constipation. 14 each 0  ? Suvorexant (BELSOMRA) 20 MG TABS Take 1 tablet by mouth at bedtime.    ? topiramate (TOPAMAX) 100 MG tablet Take 2 tabs twice a day (Patient taking differently: Take 200 mg by mouth 2 (two) times daily.) 360 tablet 3  ? traMADol (ULTRAM) 50 MG tablet Take 1 tablet (50 mg total) by mouth every 6 (six) hours as needed for severe pain. 15 tablet 0  ? ? ?Allergies as of 10/24/2021 - Review Complete 10/24/2021  ?Allergen Reaction Noted  ? Hydrocortisone Anaphylaxis 10/24/2021  ? Other Anaphylaxis 03/21/2017  ? Aptiom [eslicarbazepine] Other (See Comments) 12/29/2014  ? Azithromycin Other (See Comments) 06/08/2018  ? Bupropion Other (See Comments) 12/29/2014  ? Carbamazepine Other (See Comments) 06/08/2018  ? Dilantin [phenytoin sodium extended] Other (See Comments) 12/29/2014  ? Diphenhydramine Other (See Comments) 12/29/2014  ? Divalproex sodium Other (See Comments) 12/29/2014  ? Keppra [levetiracetam] Other (See Comments) 12/29/2014  ? Lamictal [lamotrigine] Other (See  Comments) 12/29/2014  ? Lorazepam Swelling and Other (See Comments) 01/02/2017  ? Lyrica [pregabalin] Other (See Comments) 12/29/2014  ? Morphine and related Other (See Comments) 12/29/2014  ? Pred

## 2021-10-25 NOTE — Progress Notes (Signed)
? ?Subjective/Chief Complaint: ?Still with epigastric pain ? ? ?Objective: ?Vital signs in last 24 hours: ?Temp:  [97.6 ?F (36.4 ?C)] 97.6 ?F (36.4 ?C) (04/20 1533) ?Pulse Rate:  [68-97] 76 (04/21 0730) ?Resp:  [11-30] 14 (04/21 0730) ?BP: (117-158)/(67-122) 139/74 (04/21 0730) ?SpO2:  [96 %-100 %] 97 % (04/21 0730) ?  ? ?Intake/Output from previous day: ?04/20 0701 - 04/21 0700 ?In: 495 [IV Piggyback:495] ?Out: -  ?Intake/Output this shift: ?No intake/output data recorded. ? ?GI: soft minimally tender incisions clean ? ?Lab Results:  ?Recent Labs  ?  10/24/21 ?1545 10/25/21 ?0319  ?WBC 7.2 6.0  ?HGB 12.1 11.4*  ?HCT 39.0 36.8  ?PLT 369 335  ? ?BMET ?Recent Labs  ?  10/24/21 ?1545 10/25/21 ?0319  ?NA 140 139  ?K 3.3* 3.3*  ?CL 111 114*  ?CO2 21* 20*  ?GLUCOSE 104* 77  ?BUN 10 7*  ?CREATININE 0.73 0.68  ?CALCIUM 8.9 8.4*  ? ?PT/INR ?No results for input(s): LABPROT, INR in the last 72 hours. ?ABG ?No results for input(s): PHART, HCO3 in the last 72 hours. ? ?Invalid input(s): PCO2, PO2 ? ?Studies/Results: ?CT Abdomen Pelvis W Contrast ? ?Result Date: 10/24/2021 ?CLINICAL DATA:  Concern for bile leak. History of cholecystectomy 1 week ago. EXAM: CT ABDOMEN AND PELVIS WITH CONTRAST TECHNIQUE: Multidetector CT imaging of the abdomen and pelvis was performed using the standard protocol following bolus administration of intravenous contrast. RADIATION DOSE REDUCTION: This exam was performed according to the departmental dose-optimization program which includes automated exposure control, adjustment of the mA and/or kV according to patient size and/or use of iterative reconstruction technique. CONTRAST:  17m OMNIPAQUE IOHEXOL 300 MG/ML  SOLN COMPARISON:  MRI abdomen 10/17/2021. FINDINGS: Lower chest: There is minimal atelectasis in the lung bases. Hepatobiliary: The gallbladder surgically absent. Common bile duct is dilated measuring up to 13 mm. No obstructing calculi identified. There is mild intrahepatic biliary  ductal dilatation. There are no fluid collections in the gallbladder fossa. No focal liver lesions are seen. Pancreas: Unremarkable. No pancreatic ductal dilatation or surrounding inflammatory changes. Spleen: Normal in size without focal abnormality. Adrenals/Urinary Tract: There are punctate left renal calculi. There is no hydronephrosis or perinephric fluid. The adrenal glands are within normal limits. Bladder is not well seen secondary to streak artifact in the pelvis. Stomach/Bowel: Stomach is within normal limits. Appendix appears normal. No evidence of bowel wall thickening, distention, or inflammatory changes. Vascular/Lymphatic: No significant vascular findings are present. No enlarged abdominal or pelvic lymph nodes. Reproductive: This region is not well evaluated secondary to streak artifact in the pelvis. Uterus is grossly within normal limits. Other: There is a small amount of free fluid in the pelvis. There is no focal abdominal wall hernia or free air. Musculoskeletal: Bilateral hip arthroplasties are present. Mild chronic compression deformity superior endplate of L1. IMPRESSION: 1. Status post cholecystectomy. No fluid collection in the gallbladder fossa. The common bile duct is dilated measuring 13 mm. No obstructing calculi identified. Recommend clinical correlation. 2. Nonobstructing left renal calculus. 3. Small amount of free fluid in the pelvis. Electronically Signed   By: ARonney AstersM.D.   On: 10/24/2021 19:39   ? ?Anti-infectives: ?Anti-infectives (From admission, onward)  ? ? None  ? ?  ? ? ?Assessment/Plan: ?Post operative epigastric pain ?POD 8 s/p Laparoscopic Cholecystectomy with IOC by Dr. AZenia Resideson 4/13 for Cholelithiasis (IOC negative)  ?- labs and imaging all normal dont see any issues with surgery ?-will ask GI to see today as  she continues to have pain, will check troponin and ekg as well ?  ?FEN - NPO ?VTE - SCDs, lovenox ? ? ?Rolm Bookbinder ?10/25/2021 ? ?

## 2021-10-26 ENCOUNTER — Encounter (HOSPITAL_COMMUNITY): Admission: EM | Disposition: A | Discharge status: Home / Self Care | Payer: Self-pay | Source: Home / Self Care

## 2021-10-26 ENCOUNTER — Inpatient Hospital Stay (HOSPITAL_COMMUNITY): Payer: Medicare HMO | Admitting: Certified Registered Nurse Anesthetist

## 2021-10-26 ENCOUNTER — Encounter (HOSPITAL_COMMUNITY): Payer: Self-pay | Admitting: General Surgery

## 2021-10-26 DIAGNOSIS — K259 Gastric ulcer, unspecified as acute or chronic, without hemorrhage or perforation: Secondary | ICD-10-CM | POA: Diagnosis not present

## 2021-10-26 DIAGNOSIS — I10 Essential (primary) hypertension: Secondary | ICD-10-CM

## 2021-10-26 DIAGNOSIS — F418 Other specified anxiety disorders: Secondary | ICD-10-CM

## 2021-10-26 DIAGNOSIS — F909 Attention-deficit hyperactivity disorder, unspecified type: Secondary | ICD-10-CM

## 2021-10-26 HISTORY — PX: ESOPHAGOGASTRODUODENOSCOPY (EGD) WITH PROPOFOL: SHX5813

## 2021-10-26 HISTORY — PX: BIOPSY: SHX5522

## 2021-10-26 LAB — COMPREHENSIVE METABOLIC PANEL
ALT: 10 U/L (ref 0–44)
AST: 14 U/L — ABNORMAL LOW (ref 15–41)
Albumin: 3.2 g/dL — ABNORMAL LOW (ref 3.5–5.0)
Alkaline Phosphatase: 102 U/L (ref 38–126)
Anion gap: 6 (ref 5–15)
BUN: 7 mg/dL — ABNORMAL LOW (ref 8–23)
CO2: 21 mmol/L — ABNORMAL LOW (ref 22–32)
Calcium: 8.6 mg/dL — ABNORMAL LOW (ref 8.9–10.3)
Chloride: 113 mmol/L — ABNORMAL HIGH (ref 98–111)
Creatinine, Ser: 0.86 mg/dL (ref 0.44–1.00)
GFR, Estimated: >60 mL/min (ref >60)
Glucose, Bld: 71 mg/dL (ref 70–99)
Potassium: 3.1 mmol/L — ABNORMAL LOW (ref 3.5–5.1)
Sodium: 140 mmol/L (ref 135–145)
Total Bilirubin: 0.8 mg/dL (ref 0.3–1.2)
Total Protein: 6.4 g/dL — ABNORMAL LOW (ref 6.5–8.1)

## 2021-10-26 SURGERY — ESOPHAGOGASTRODUODENOSCOPY (EGD) WITH PROPOFOL
Anesthesia: Monitor Anesthesia Care

## 2021-10-26 MED ORDER — METHOCARBAMOL 500 MG PO TABS
1000.0000 mg | ORAL_TABLET | Freq: Three times a day (TID) | ORAL | Status: DC
  Administered 2021-10-26 – 2021-10-30 (×12): 1000 mg via ORAL
  Filled 2021-10-26 (×14): qty 2

## 2021-10-26 MED ORDER — TRAMADOL HCL 50 MG PO TABS
50.0000 mg | ORAL_TABLET | Freq: Four times a day (QID) | ORAL | Status: DC | PRN
  Administered 2021-10-26 – 2021-10-28 (×7): 100 mg via ORAL
  Administered 2021-10-29 – 2021-10-30 (×2): 50 mg via ORAL
  Filled 2021-10-26: qty 1
  Filled 2021-10-26: qty 2
  Filled 2021-10-26: qty 1
  Filled 2021-10-26: qty 2
  Filled 2021-10-26: qty 1
  Filled 2021-10-26 (×5): qty 2

## 2021-10-26 MED ORDER — PROPOFOL 10 MG/ML IV BOLUS
INTRAVENOUS | Status: DC | PRN
Start: 2021-10-26 — End: 2021-10-26
  Administered 2021-10-26 (×6): 40 mg via INTRAVENOUS

## 2021-10-26 MED ORDER — LIDOCAINE 2% (20 MG/ML) 5 ML SYRINGE
INTRAMUSCULAR | Status: DC | PRN
  Administered 2021-10-26: 60 mg via INTRAVENOUS

## 2021-10-26 MED ORDER — ACETAMINOPHEN 500 MG PO TABS
1000.0000 mg | ORAL_TABLET | Freq: Four times a day (QID) | ORAL | Status: DC
  Administered 2021-10-30: 1000 mg via ORAL
  Filled 2021-10-26 (×3): qty 2

## 2021-10-26 MED ORDER — POLYETHYLENE GLYCOL 3350 17 G PO PACK
17.0000 g | PACK | Freq: Every day | ORAL | Status: DC
  Administered 2021-10-26 – 2021-10-30 (×4): 17 g via ORAL
  Filled 2021-10-26 (×6): qty 1

## 2021-10-26 MED ORDER — KETOROLAC TROMETHAMINE 15 MG/ML IJ SOLN
15.0000 mg | Freq: Four times a day (QID) | INTRAMUSCULAR | Status: DC
  Administered 2021-10-26 (×2): 15 mg via INTRAVENOUS
  Filled 2021-10-26 (×2): qty 1

## 2021-10-26 MED ORDER — LACTATED RINGERS IV SOLN
INTRAVENOUS | Status: AC | PRN
Start: 2021-10-26 — End: 2021-10-26
  Administered 2021-10-26: 20 mL/h via INTRAVENOUS

## 2021-10-26 MED ORDER — PANTOPRAZOLE SODIUM 40 MG PO TBEC
40.0000 mg | DELAYED_RELEASE_TABLET | Freq: Two times a day (BID) | ORAL | Status: DC
  Administered 2021-10-26 – 2021-10-30 (×8): 40 mg via ORAL
  Filled 2021-10-26 (×9): qty 1

## 2021-10-26 SURGICAL SUPPLY — 15 items

## 2021-10-26 NOTE — Anesthesia Postprocedure Evaluation (Signed)
Anesthesia Post Note ? ?Patient: Kelli Navarro ? ?Procedure(s) Performed: ESOPHAGOGASTRODUODENOSCOPY (EGD) WITH PROPOFOL ?BIOPSY ? ?  ? ?Patient location during evaluation: Endoscopy ?Anesthesia Type: MAC ?Level of consciousness: awake ?Pain management: pain level controlled ?Vital Signs Assessment: post-procedure vital signs reviewed and stable ?Respiratory status: spontaneous breathing, nonlabored ventilation, respiratory function stable and patient connected to nasal cannula oxygen ?Cardiovascular status: stable and blood pressure returned to baseline ?Postop Assessment: no apparent nausea or vomiting ?Anesthetic complications: no ? ? ?No notable events documented. ? ?Last Vitals:  ?Vitals:  ? 10/26/21 1430 10/26/21 1455  ?BP: (!) 141/75 139/84  ?Pulse: 86 79  ?Resp: (!) 23 17  ?Temp: 36.7 ?C 36.8 ?C  ?SpO2: 100% 100%  ?  ?Last Pain:  ?Vitals:  ? 10/26/21 1455  ?TempSrc: Oral  ?PainSc:   ? ? ?  ?  ?  ?  ?  ?  ? ?Mikala Podoll P Luciann Gossett ? ? ? ? ?

## 2021-10-26 NOTE — Progress Notes (Signed)
Mobility Specialist Progress Note: ? ? 10/26/21 1200  ?Mobility  ?Activity Ambulated independently to bathroom  ?Level of Assistance Independent  ?Assistive Device None  ?Distance Ambulated (ft) 30 ft  ?Activity Response Tolerated well  ?$Mobility charge 1 Mobility  ? ?Gwendolen Hewlett ?Mobility Specialist ?Primary Phone [phone] ? ?

## 2021-10-26 NOTE — Anesthesia Procedure Notes (Signed)
Procedure Name: [place] ?Date/Time: 10/26/2021 1:35 PM ?Performed by: Renato Shin, CRNA ?Pre-anesthesia Checklist: Patient identified, Emergency Drugs available, Suction available and Patient being monitored ?Patient Re-evaluated:Patient Re-evaluated prior to induction ?Oxygen Delivery Method: Nasal cannula ?Preoxygenation: Pre-oxygenation with 100% oxygen ?Induction Type: IV induction ?Airway Equipment and Method: Bite block ?Placement Confirmation: positive ETCO2 and breath sounds checked- equal and bilateral ?Dental Injury: Teeth and Oropharynx as per pre-operative assessment  ? ? ? ? ?

## 2021-10-26 NOTE — Anesthesia Preprocedure Evaluation (Addendum)
Anesthesia Evaluation  ?Patient identified by MRN, date of birth, ID band ?Patient awake ? ? ? ?Reviewed: ?Allergy & Precautions, NPO status , Patient's Chart, lab work & pertinent test results ? ?Airway ?Mallampati: II ? ?TM Distance: >3 FB ?Neck ROM: Full ? ? ? Dental ? ?(+) Missing, Upper Dentures ?  ?Pulmonary ?former smoker,  ?  ?Pulmonary exam normal ? ? ? ? ? ? ? Cardiovascular ?hypertension, Pt. on medications ?Normal cardiovascular exam ? ? ?  ?Neuro/Psych ?Seizures -,  PSYCHIATRIC DISORDERS Anxiety Depression   ? GI/Hepatic ?negative GI ROS, Neg liver ROS,   ?Endo/Other  ?negative endocrine ROS ? Renal/GU ?negative Renal ROS  ? ?  ?Musculoskeletal ?Chronic back pain  ? Abdominal ?  ?Peds ? ?(+) ADHD Hematology ?negative hematology ROS ?(+)   ?Anesthesia Other Findings ?epigastric pain ? Reproductive/Obstetrics ? ?  ? ? ? ? ? ? ? ? ? ? ? ? ? ?  ?  ? ? ? ? ? ? ? ?Anesthesia Physical ?Anesthesia Plan ? ?ASA: 2 ? ?Anesthesia Plan: MAC  ? ?Post-op Pain Management:   ? ?Induction: Intravenous ? ?PONV Risk Score and Plan: 2 and Propofol infusion and Treatment may vary due to age or medical condition ? ?Airway Management Planned: Nasal Cannula ? ?Additional Equipment:  ? ?Intra-op Plan:  ? ?Post-operative Plan:  ? ?Informed Consent: I have reviewed the patients History and Physical, chart, labs and discussed the procedure including the risks, benefits and alternatives for the proposed anesthesia with the patient or authorized representative who has indicated his/her understanding and acceptance.  ? ? ? ?Dental advisory given ? ?Plan Discussed with: CRNA ? ?Anesthesia Plan Comments:   ? ? ? ? ? ?Anesthesia Quick Evaluation ? ?

## 2021-10-26 NOTE — Op Note (Addendum)
[facility] ?Patient Name: Kelli Navarro ?Procedure Date : 10/26/2021 ?MRN: [id number] ?Attending MD: Thornton Park MD, MD ?Date of Birth: 13-Dec-1950 ?CSN: [id number] ?Age: 71 ?Admit Type: Inpatient ?Procedure:                Upper GI endoscopy ?Indications:              Upper abdominal pain not improved after  ?                          cholecystectomy ?Providers:                Thornton Park MD, MD, Grace Isaac, RN, Primitivo Gauze  ?                          [place], Technician, [employer],  ?                          Technician ?Referring MD:              ?Medicines:                Monitored Anesthesia Care ?Complications:            No immediate complications. ?Estimated Blood Loss:     Estimated blood loss was minimal. ?Procedure:                Pre-Anesthesia Assessment: ?                          - Prior to the procedure, a History and Physical  ?                          was performed, and patient medications and  ?                          allergies were reviewed. The patient's tolerance of  ?                          previous anesthesia was also reviewed. The risks  ?                          and benefits of the procedure and the sedation  ?                          options and risks were discussed with the patient.  ?                          All questions were answered, and informed consent  ?                          was obtained. Prior Anticoagulants: The patient has  ?                          taken no previous anticoagulant or antiplatelet  ?                          agents. ASA Grade Assessment: II -  A patient with  ?                          mild systemic disease. After reviewing the risks  ?                          and benefits, the patient was deemed in  ?                          satisfactory condition to undergo the procedure. ?                          After obtaining informed consent, the endoscope was  ?                          passed under direct vision.  Throughout the  ?                          procedure, the patient's blood pressure, pulse, and  ?                          oxygen saturations were monitored continuously. The  ?                          [id number]-[id number] ([id number] Olympus endoscope was introduced  ?                          through the mouth, and advanced to the third part  ?                          of duodenum. The upper GI endoscopy was  ?                          accomplished without difficulty. The patient  ?                          tolerated the procedure well. ?Scope In: ?Scope Out: ?Findings: ?     There is a subtle ringed appearance in the middle third of the  ?     esophagus. Biopsies were taken from the mid/proximal and distal  ?     esophagus with a cold forceps for histology. Estimated blood loss was  ?     minimal. ?     Five non-bleeding cratered gastric ulcers with no stigmata of bleeding  ?     were found in the gastric body. The edges of the ulcers are not heaped.  ?     The largest lesion was 8 mm in largest dimension. Biopsies were taken  ?     from the antrum, body, fundus, and ulcer margins with a cold forceps for  ?     histology. Estimated blood loss was minimal. ?     The examined duodenum was normal. ?Impression:               - Subtle ringed appearance mucosa in the esophagus.  ?  Biopsied. ?                          - Non-bleeding gastric ulcers with no stigmata of  ?                          bleeding. Biopsied. ?                          - Normal examined duodenum. ?Recommendation:           - Patient has a contact number available for  ?                          emergencies. The signs and symptoms of potential  ?                          delayed complications were discussed with the  ?                          patient. Return to normal activities tomorrow.  ?                          Written discharge instructions were provided to the  ?                          patient. ?                          -  Resume previous diet. ?                          - Continue present medications including  ?                          pantoprazole 40 mg BID. ?                          - Await pathology results. ?                          - No aspirin, ibuprofen, naproxen, or other  ?                          non-steroidal anti-inflammatory drugs. ?                          - Office follow-up with Dr. Candis Schatz at [place] in  ?                          3-4 weeks. ?                          - Repeat EGD in 8-10 weeks to follow-up for ulcer  ?                          healing. ?  The results and recommendations were discussed with  ?                          the patient's wife by phone. The inpatient GI  ?                          service will move to stand-by. Please call the  ?                          on-call gastroenterologist with any additional  ?                          questions or concerns during this hospitalization. ?Procedure Code(s):        --- Professional --- ?                          [phone], Esophagogastroduodenoscopy, flexible,  ?                          transoral; with biopsy, single or multiple ?Diagnosis Code(s):        --- Professional --- ?                          K25.9, Gastric ulcer, unspecified as acute or  ?                          chronic, without hemorrhage or perforation ?                          R10.10, Upper abdominal pain, unspecified ?CPT copyright 2019 [employer]. All rights reserved. ?The codes documented in this report are preliminary and upon coder review may  ?be revised to meet current compliance requirements. ?Thornton Park MD, MD ?10/26/2021 2:18:17 PM ?This report has been signed electronically. ?Number of Addenda: 0 ?

## 2021-10-26 NOTE — Progress Notes (Signed)
? ?Subjective/Chief Complaint: ?PT with con't pain overnight in epigastrum ?Mobilizing well ? ? ?Objective: ?Vital signs in last 24 hours: ?Temp:  [98.1 ?F (36.7 ?C)-98.6 ?F (37 ?C)] 98.1 ?F (36.7 ?C) (04/22 1638) ?Pulse Rate:  [71-101] 71 (04/22 0738) ?Resp:  [15-27] 17 (04/22 4665) ?BP: (101-154)/(66-93) 124/66 (04/22 9935) ?SpO2:  [97 %-100 %] 99 % (04/22 0738) ?  ? ?Intake/Output from previous day: ?04/21 0701 - 04/22 0700 ?In: 1983.5 [I.V.:1983.5] ?Out: -  ?Intake/Output this shift: ?No intake/output data recorded. ? ?PE: ? ?Constitutional: No acute distress, conversant, appears states age. ?Eyes: Anicteric sclerae, moist conjunctiva, no lid lag ?Lungs: Clear to auscultation bilaterally, normal respiratory effort ?CV: regular rate and rhythm, no murmurs, no peripheral edema, pedal pulses 2+ ?GI: Soft, no masses or hepatosplenomegaly, non-tender to palpation ?Skin: No rashes, palpation reveals normal turgor ?Psychiatric: appropriate judgment and insight, oriented to person, place, and time ? ? ?Lab Results:  ?Recent Labs  ?  10/24/21 ?1545 10/25/21 ?0319  ?WBC 7.2 6.0  ?HGB 12.1 11.4*  ?HCT 39.0 36.8  ?PLT 369 335  ? ? ? ? ?CMP  ?   ?Component Value Date/Time  ? NA 140 10/26/2021 0051  ? K 3.1 (L) 10/26/2021 0051  ? CL 113 (H) 10/26/2021 0051  ? CO2 21 (L) 10/26/2021 0051  ? GLUCOSE 71 10/26/2021 0051  ? BUN 7 (L) 10/26/2021 0051  ? CREATININE 0.86 10/26/2021 0051  ? CREATININE 0.84 01/05/2020 1607  ? CALCIUM 8.6 (L) 10/26/2021 0051  ? PROT 6.4 (L) 10/26/2021 0051  ? ALBUMIN 3.2 (L) 10/26/2021 0051  ? AST 14 (L) 10/26/2021 0051  ? AST 19 01/05/2020 1607  ? ALT 10 10/26/2021 0051  ? ALT 14 01/05/2020 1607  ? ALKPHOS 102 10/26/2021 0051  ? BILITOT 0.8 10/26/2021 0051  ? BILITOT 0.3 01/05/2020 1607  ? GFRNONAA >60 10/26/2021 0051  ? GFRNONAA >60 01/05/2020 1607  ? GFRAA >60 04/07/2020 1527  ? GFRAA >60 01/05/2020 1607  ? ? ? ? ?PT/INR ?Recent Labs  ?  10/25/21 ?1247  ?LABPROT 13.0  ?INR 1.0  ? ?ABG ?No results  for input(s): PHART, HCO3 in the last 72 hours. ? ?Invalid input(s): PCO2, PO2 ? ?Studies/Results: ?CT Abdomen Pelvis W Contrast ? ?Result Date: 10/24/2021 ?CLINICAL DATA:  Concern for bile leak. History of cholecystectomy 1 week ago. EXAM: CT ABDOMEN AND PELVIS WITH CONTRAST TECHNIQUE: Multidetector CT imaging of the abdomen and pelvis was performed using the standard protocol following bolus administration of intravenous contrast. RADIATION DOSE REDUCTION: This exam was performed according to the departmental dose-optimization program which includes automated exposure control, adjustment of the mA and/or kV according to patient size and/or use of iterative reconstruction technique. CONTRAST:  176m OMNIPAQUE IOHEXOL 300 MG/ML  SOLN COMPARISON:  MRI abdomen 10/17/2021. FINDINGS: Lower chest: There is minimal atelectasis in the lung bases. Hepatobiliary: The gallbladder surgically absent. Common bile duct is dilated measuring up to 13 mm. No obstructing calculi identified. There is mild intrahepatic biliary ductal dilatation. There are no fluid collections in the gallbladder fossa. No focal liver lesions are seen. Pancreas: Unremarkable. No pancreatic ductal dilatation or surrounding inflammatory changes. Spleen: Normal in size without focal abnormality. Adrenals/Urinary Tract: There are punctate left renal calculi. There is no hydronephrosis or perinephric fluid. The adrenal glands are within normal limits. Bladder is not well seen secondary to streak artifact in the pelvis. Stomach/Bowel: Stomach is within normal limits. Appendix appears normal. No evidence of bowel wall thickening, distention, or inflammatory changes. Vascular/Lymphatic:  No significant vascular findings are present. No enlarged abdominal or pelvic lymph nodes. Reproductive: This region is not well evaluated secondary to streak artifact in the pelvis. Uterus is grossly within normal limits. Other: There is a small amount of free fluid in the  pelvis. There is no focal abdominal wall hernia or free air. Musculoskeletal: Bilateral hip arthroplasties are present. Mild chronic compression deformity superior endplate of L1. IMPRESSION: 1. Status post cholecystectomy. No fluid collection in the gallbladder fossa. The common bile duct is dilated measuring 13 mm. No obstructing calculi identified. Recommend clinical correlation. 2. Nonobstructing left renal calculus. 3. Small amount of free fluid in the pelvis. Electronically Signed   By: Ronney Asters M.D.   On: 10/24/2021 19:39  ? ?MR ABDOMEN MRCP W WO CONTAST ? ?Result Date: 10/25/2021 ?CLINICAL DATA:  RIGHT upper quadrant pain. Abdominal pain. Postcholecystectomy 7 days prior. EXAM: MRI ABDOMEN WITHOUT AND WITH CONTRAST (INCLUDING MRCP) TECHNIQUE: Multiplanar multisequence MR imaging of the abdomen was performed both before and after the administration of intravenous contrast. Heavily T2-weighted images of the biliary and pancreatic ducts were obtained, and three-dimensional MRCP images were rendered by post processing. CONTRAST:  5.52m GADAVIST GADOBUTROL 1 MMOL/ML IV SOLN COMPARISON:  Intraoperative cholangiogram.  CT 10/24/2021 FINDINGS: Lower chest:  Lung bases are clear. Hepatobiliary: No intrahepatic biliary duct dilatation. Postcholecystectomy. No abnormal collections in the gallbladder fossa postcholecystectomy. The common hepatic duct and common bile duct are dilated similar prior. Common bile duct measures 10 mm. The common hepatic duct measures 10 mm. Prominent cystic duct remnant is noted. There are no filling defects within the common hepatic duct or common bile duct. No external compression. The common bile duct dilatation is greater than in MRI of 10/17/2021 at which time the common bile duct measures 6 mm. On the heavily T2 weighted MRCP sequences there is increased decreased intensity within the most distal common bile duct (image 47/7). This finding is not represented on the other sequences  and favored artifactual. Pancreas: Normal pancreatic parenchymal intensity. No ductal dilatation or inflammation. No variant ductal anatomy. Spleen: Normal spleen. Adrenals/urinary tract: Adrenal glands and kidneys are normal. Stomach/Bowel: Stomach and limited of the small bowel is unremarkable Vascular/Lymphatic: Abdominal aortic normal caliber. No retroperitoneal periportal lymphadenopathy. Musculoskeletal: No aggressive osseous lesion IMPRESSION: 1. Dilatation of the common hepatic duct and common bile duct without obstructing lesion identified. Duct dilatation is similar to intraoperative cholangiogram but increased from prior MRI of 10/17/2021. 2. No abnormal fluid collections in the gallbladder fossa following cholecystectomy. 3. Normal pancreas.  No variant ductal anatomy.  No inflammation. Electronically Signed   By: SSuzy BouchardM.D.   On: 10/25/2021 16:09   ? ? ?Assessment/Plan: ?Post operative epigastric pain ?POD 9 s/p Laparoscopic Cholecystectomy with IOC by Dr. AZenia Resideson 4/13 for Cholelithiasis (IOC negative)  ?- labs and imaging all normal dont see any issues with surgery ?-GI to do EGD today.  Hopefully if no acute findings can start clears ?  ?FEN - NPO, will add miralax ?VTE - SCDs, lovenox ? LOS: 1 day  ? ? ?ARalene Ok?10/26/2021 ? ?

## 2021-10-26 NOTE — Interval H&P Note (Signed)
History and Physical Interval Note: ? ?10/26/2021 ?1:08 PM ? ?Kelli Navarro  has presented today for surgery, with the diagnosis of epigastric pain.  The various methods of treatment have been discussed with the patient and family. After consideration of risks, benefits and other options for treatment, the patient has consented to  Procedure(s): ?ESOPHAGOGASTRODUODENOSCOPY (EGD) WITH PROPOFOL (N/A) as a surgical intervention.  The patient's history has been reviewed, patient examined, no change in status, stable for surgery.  I have reviewed the patient's chart and labs.  Questions were answered to the patient's satisfaction.   ? ? ?Thornton Park ? ? ?

## 2021-10-26 NOTE — Transfer of Care (Signed)
Immediate Anesthesia Transfer of Care Note ? ?Patient: Kelli Navarro ? ?Procedure(s) Performed: ESOPHAGOGASTRODUODENOSCOPY (EGD) WITH PROPOFOL ?BIOPSY ? ?Patient Location: PACU ? ?Anesthesia Type:MAC ? ?Level of Consciousness: awake and patient cooperative ? ?Airway & Oxygen Therapy: Patient Spontanous Breathing ? ?Post-op Assessment: Report given to RN and Post -op Vital signs reviewed and stable ? ?Post vital signs: Reviewed and stable ? ?Last Vitals:  ?Vitals Value Taken Time  ?BP    ?Temp    ?Pulse    ?Resp    ?SpO2    ? ? ?Last Pain:  ?Vitals:  ? 10/26/21 1318  ?TempSrc: Temporal  ?PainSc: 4   ?   ? ?Patients Stated Pain Goal: 1 (10/25/21 2345) ? ?Complications: No notable events documented. ?

## 2021-10-27 ENCOUNTER — Other Ambulatory Visit: Payer: Self-pay

## 2021-10-27 DIAGNOSIS — K253 Acute gastric ulcer without hemorrhage or perforation: Secondary | ICD-10-CM

## 2021-10-27 DIAGNOSIS — R1010 Upper abdominal pain, unspecified: Secondary | ICD-10-CM | POA: Diagnosis not present

## 2021-10-27 LAB — COMPREHENSIVE METABOLIC PANEL
ALT: 10 U/L (ref 0–44)
AST: 14 U/L — ABNORMAL LOW (ref 15–41)
Albumin: 3 g/dL — ABNORMAL LOW (ref 3.5–5.0)
Alkaline Phosphatase: 93 U/L (ref 38–126)
Anion gap: 6 (ref 5–15)
BUN: <5 mg/dL — ABNORMAL LOW (ref 8–23)
CO2: 20 mmol/L — ABNORMAL LOW (ref 22–32)
Calcium: 8.6 mg/dL — ABNORMAL LOW (ref 8.9–10.3)
Chloride: 116 mmol/L — ABNORMAL HIGH (ref 98–111)
Creatinine, Ser: 0.81 mg/dL (ref 0.44–1.00)
GFR, Estimated: >60 mL/min (ref >60)
Glucose, Bld: 105 mg/dL — ABNORMAL HIGH (ref 70–99)
Potassium: 3.6 mmol/L (ref 3.5–5.1)
Sodium: 142 mmol/L (ref 135–145)
Total Bilirubin: 0.5 mg/dL (ref 0.3–1.2)
Total Protein: 5.8 g/dL — ABNORMAL LOW (ref 6.5–8.1)

## 2021-10-27 MED ORDER — SODIUM CHLORIDE 0.9 % IV SOLN
12.5000 mg | Freq: Four times a day (QID) | INTRAVENOUS | Status: DC | PRN
  Administered 2021-10-27 – 2021-10-28 (×3): 12.5 mg via INTRAVENOUS
  Filled 2021-10-27 (×3): qty 0.5

## 2021-10-27 MED ORDER — SUCRALFATE 1 GM/10ML PO SUSP
1.0000 g | Freq: Three times a day (TID) | ORAL | Status: DC
  Administered 2021-10-27 – 2021-10-30 (×13): 1 g via ORAL
  Filled 2021-10-27 (×13): qty 10

## 2021-10-27 NOTE — Progress Notes (Addendum)
1 Day Post-Op  ? ?Subjective/Chief Complaint: ?Patient doing well.  She states her pain is better today. ?EGD results noted. ? ? ?Objective: ?Vital signs in last 24 hours: ?Temp:  [97.3 ?F (36.3 ?C)-98.3 ?F (36.8 ?C)] 98.2 ?F (36.8 ?C) (04/23 0518) ?Pulse Rate:  [69-97] 69 (04/23 0518) ?Resp:  [17-27] 18 (04/23 0518) ?BP: (119-157)/(70-93) 119/75 (04/23 0518) ?SpO2:  [96 %-100 %] 97 % (04/23 0518) ?  ? ?Intake/Output from previous day: ?04/22 0701 - 04/23 0700 ?In: 1780.7 [P.O.:420; I.V.:1302.6; IV Piggyback:58.1] ?Out: 1 [Urine:1] ?Intake/Output this shift: ?No intake/output data recorded. ? ?PE: ? ?Constitutional: No acute distress, conversant, appears states age. ?Eyes: Anicteric sclerae, moist conjunctiva, no lid lag ?Lungs: Clear to auscultation bilaterally, normal respiratory effort ?CV: regular rate and rhythm, no murmurs, no peripheral edema, pedal pulses 2+ ?GI: Soft, no masses or hepatosplenomegaly, non-tender to palpation ?Skin: No rashes, palpation reveals normal turgor ?Psychiatric: appropriate judgment and insight, oriented to person, place, and time ? ? ? ?Lab Results:  ?Recent Labs  ?  10/24/21 ?1545 10/25/21 ?0319  ?WBC 7.2 6.0  ?HGB 12.1 11.4*  ?HCT 39.0 36.8  ?PLT 369 335  ? ?BMET ?Recent Labs  ?  10/25/21 ?9242 10/26/21 ?0051  ?NA 139 140  ?K 3.3* 3.1*  ?CL 114* 113*  ?CO2 20* 21*  ?GLUCOSE 77 71  ?BUN 7* 7*  ?CREATININE 0.68 0.86  ?CALCIUM 8.4* 8.6*  ? ?PT/INR ?Recent Labs  ?  10/25/21 ?1247  ?LABPROT 13.0  ?INR 1.0  ? ?ABG ?No results for input(s): PHART, HCO3 in the last 72 hours. ? ?Invalid input(s): PCO2, PO2 ? ?Studies/Results: ?MR ABDOMEN MRCP W WO CONTAST ? ?Result Date: 10/25/2021 ?CLINICAL DATA:  RIGHT upper quadrant pain. Abdominal pain. Postcholecystectomy 7 days prior. EXAM: MRI ABDOMEN WITHOUT AND WITH CONTRAST (INCLUDING MRCP) TECHNIQUE: Multiplanar multisequence MR imaging of the abdomen was performed both before and after the administration of intravenous contrast. Heavily  T2-weighted images of the biliary and pancreatic ducts were obtained, and three-dimensional MRCP images were rendered by post processing. CONTRAST:  5.31m GADAVIST GADOBUTROL 1 MMOL/ML IV SOLN COMPARISON:  Intraoperative cholangiogram.  CT 10/24/2021 FINDINGS: Lower chest:  Lung bases are clear. Hepatobiliary: No intrahepatic biliary duct dilatation. Postcholecystectomy. No abnormal collections in the gallbladder fossa postcholecystectomy. The common hepatic duct and common bile duct are dilated similar prior. Common bile duct measures 10 mm. The common hepatic duct measures 10 mm. Prominent cystic duct remnant is noted. There are no filling defects within the common hepatic duct or common bile duct. No external compression. The common bile duct dilatation is greater than in MRI of 10/17/2021 at which time the common bile duct measures 6 mm. On the heavily T2 weighted MRCP sequences there is increased decreased intensity within the most distal common bile duct (image 47/7). This finding is not represented on the other sequences and favored artifactual. Pancreas: Normal pancreatic parenchymal intensity. No ductal dilatation or inflammation. No variant ductal anatomy. Spleen: Normal spleen. Adrenals/urinary tract: Adrenal glands and kidneys are normal. Stomach/Bowel: Stomach and limited of the small bowel is unremarkable Vascular/Lymphatic: Abdominal aortic normal caliber. No retroperitoneal periportal lymphadenopathy. Musculoskeletal: No aggressive osseous lesion IMPRESSION: 1. Dilatation of the common hepatic duct and common bile duct without obstructing lesion identified. Duct dilatation is similar to intraoperative cholangiogram but increased from prior MRI of 10/17/2021. 2. No abnormal fluid collections in the gallbladder fossa following cholecystectomy. 3. Normal pancreas.  No variant ductal anatomy.  No inflammation. Electronically Signed   By: SNicole Kindred  Leonia Reeves M.D.   On: 10/25/2021 16:09    ? ?Anti-infectives: ?Anti-infectives (From admission, onward)  ? ? None  ? ?  ? ? ?Assessment/Plan: ?Post operative epigastric pain ?POD 9 s/p Laparoscopic Cholecystectomy with IOC by Dr. Zenia Resides on 4/13 for Cholelithiasis (IOC negative)  ?Gastric ulcers ? ?-Appreciate Dr. Tarri Glenn input. ?Carafate, PPI  x 2 weeks BID ? ?FEN -clear liquids, adv slowly ?VTE - SCDs, lovenox ? ? LOS: 2 days  ? ? ?Ralene Ok ?10/27/2021 ? ?

## 2021-10-27 NOTE — Progress Notes (Addendum)
? ? ? ?  [facility] Progress Note ? ?CC:  Abdominal pain ? ?Assessment / Plan: ? ?S/P laparoscopic cholecystectomy 10/17/2021 with negative IOC with recurrent abdominal pain postoperatively. MRI/MRCP negative. Liver enzymes normal. EGD 10/26/21 showed multiple gastric ulcers.  ? ?Recent hypokalemia ? ?Recommendations: ?- CMP ordered ?- PPI BID x 8 weeks, then reduce to QAM ?- Add Carafate slurry QID x 2 weeks ?- Diet advance as tolerated ?- Gastric biopsies are pending ?- Repeat EGD in 8-10 weeks to document healing ?- Avoid all NSAIDs (she denies the use of any OTC meds except for Tylenol) ?- I have arranged outpatient follow-up with Dr. Candis Schatz at [facility] ? ?GI will move to stand-by. Please call the on-call gastroenterologist with any additional questions or concerns during this hospitalization. ? ?Subjective: ?Continues to have abdominal pain and nausea. Essentially unchanged from yesterday. No family present at the time of my evaluation.  ? ?Objective:  ?Vital signs in last 24 hours: ?Temp:  [97.3 ?F (36.3 ?C)-98.3 ?F (36.8 ?C)] 98.2 ?F (36.8 ?C) (04/23 0518) ?Pulse Rate:  [69-97] 69 (04/23 0518) ?Resp:  [17-27] 18 (04/23 0518) ?BP: (119-157)/(66-93) 119/75 (04/23 0518) ?SpO2:  [96 %-100 %] 97 % (04/23 0518) ?  ?General:   Alert, in NAD ?Abdomen:  Soft. Mild diffuse abdominal tenderness. Lap chole incision sites are without drainage, bleeding or signs of infection. Nondistended. Normal bowel sounds. No rebound or guarding. ?Extremities:  Without edema. ?Neurologic:  Alert and  oriented x4;  grossly normal neurologically. ?Psych:  Alert and cooperative. Normal mood and affect. ? ? ?Lab Results: ?Recent Labs  ?  10/24/21 ?1545 10/25/21 ?0319  ?WBC 7.2 6.0  ?HGB 12.1 11.4*  ?HCT 39.0 36.8  ?PLT 369 335  ? ?BMET ?Recent Labs  ?  10/24/21 ?1545 10/25/21 ?2637 10/26/21 ?0051  ?NA 140 139 140  ?K 3.3* 3.3* 3.1*  ?CL 111 114* 113*  ?CO2 21* 20* 21*  ?GLUCOSE 104* 77 71  ?BUN 10 7* 7*  ?CREATININE 0.73 0.68  0.86  ?CALCIUM 8.9 8.4* 8.6*  ? ?LFT ?Recent Labs  ?  10/26/21 ?0051  ?PROT 6.4*  ?ALBUMIN 3.2*  ?AST 14*  ?ALT 10  ?ALKPHOS 102  ?BILITOT 0.8  ? ?PT/INR ?Recent Labs  ?  10/25/21 ?1247  ?LABPROT 13.0  ?INR 1.0  ? ? ? ? ? LOS: 2 days  ? ?Thorn[place] Park  10/27/2021, 7:32 AM ? ?  ?

## 2021-10-27 NOTE — Plan of Care (Signed)

## 2021-10-28 ENCOUNTER — Telehealth: Payer: Self-pay

## 2021-10-28 ENCOUNTER — Other Ambulatory Visit: Payer: Self-pay

## 2021-10-28 DIAGNOSIS — K253 Acute gastric ulcer without hemorrhage or perforation: Secondary | ICD-10-CM

## 2021-10-28 MED ORDER — PROMETHAZINE HCL 25 MG PO TABS
12.5000 mg | ORAL_TABLET | Freq: Three times a day (TID) | ORAL | Status: DC | PRN
  Administered 2021-10-28 – 2021-10-30 (×4): 12.5 mg via ORAL
  Filled 2021-10-28 (×4): qty 1

## 2021-10-28 MED ORDER — ONDANSETRON 4 MG PO TBDP
4.0000 mg | ORAL_TABLET | Freq: Four times a day (QID) | ORAL | Status: DC
  Administered 2021-10-28 – 2021-10-30 (×8): 4 mg via ORAL
  Filled 2021-10-28 (×8): qty 1

## 2021-10-28 MED ORDER — PROCHLORPERAZINE EDISYLATE 10 MG/2ML IJ SOLN
10.0000 mg | Freq: Three times a day (TID) | INTRAMUSCULAR | Status: DC | PRN
  Administered 2021-10-28: 10 mg via INTRAVENOUS
  Filled 2021-10-28: qty 2

## 2021-10-28 NOTE — Telephone Encounter (Signed)
Pt scheduled to see Dr. Candis Schatz 11/19/21 at 3pm. EGD scheduled in the [place] 12/24/21 at 10am. Amb referral in epic. ?

## 2021-10-28 NOTE — Progress Notes (Signed)
Pt c/o nausea. PRN phenergan that was changed from IV to PO was last given with scheduled Zofran at 1815. On call provider Dr. Greer Pickerel paged. Verbal order with readback given for Compazine 10 mg IV q8h PRN for refractory nausea.  ? ?

## 2021-10-28 NOTE — Progress Notes (Signed)
2 Days Post-Op  ? ?Subjective/Chief Complaint: ?Repots abd pain is improving and manageable, ~5/10.  ?Her cc is nausea. She had one episode of emesis yesterday evening. Has been requiring around the clock zofran and a dose of IV phenergan daily. She wants to go home but is concerned about nausea. ? ? ?Objective: ?Vital signs in last 24 hours: ?Temp:  [97.5 ?F (36.4 ?C)-98.3 ?F (36.8 ?C)] 98.3 ?F (36.8 ?C) (04/24 3536) ?Pulse Rate:  [64-85] 65 (04/24 0724) ?Resp:  [16-18] 16 (04/24 0724) ?BP: (116-138)/(67-79) 120/75 (04/24 0724) ?SpO2:  [97 %-100 %] 99 % (04/24 0724) ?Last BM Date : 08/28/21 (per pt report) ? ?Intake/Output from previous day: ?04/23 0701 - 04/24 0700 ?In: 1890.9 [P.O.:560; I.V.:1280.9; IV Piggyback:50] ?Out: -  ?Intake/Output this shift: ?No intake/output data recorded. ? ?PE: ? ?Constitutional: No acute distress, conversant, appears states age ?Eyes: Anicteric sclerae, moist conjunctiva, no lid lag ?Lungs: normal respiratory effort ?GI: Soft, no masses or hepatosplenomegaly, non-tender to palpation, incisions appear well healing ?Skin: No rashes, palpation reveals normal turgor ?Psychiatric: appropriate judgment and insight, oriented to person, place, and time; appears restless - moving and itching ? ?CMP  ?   ?Component Value Date/Time  ? NA 142 10/27/2021 0942  ? K 3.6 10/27/2021 0942  ? CL 116 (H) 10/27/2021 1443  ? CO2 20 (L) 10/27/2021 0942  ? GLUCOSE 105 (H) 10/27/2021 0942  ? BUN <5 (L) 10/27/2021 0942  ? CREATININE 0.81 10/27/2021 0942  ? CREATININE 0.84 01/05/2020 1607  ? CALCIUM 8.6 (L) 10/27/2021 0942  ? PROT 5.8 (L) 10/27/2021 1540  ? ALBUMIN 3.0 (L) 10/27/2021 0867  ? AST 14 (L) 10/27/2021 0942  ? AST 19 01/05/2020 1607  ? ALT 10 10/27/2021 0942  ? ALT 14 01/05/2020 1607  ? ALKPHOS 93 10/27/2021 0942  ? BILITOT 0.5 10/27/2021 0942  ? BILITOT 0.3 01/05/2020 1607  ? GFRNONAA >60 10/27/2021 0942  ? GFRNONAA >60 01/05/2020 1607  ? GFRAA >60 04/07/2020 1527  ? GFRAA >60 01/05/2020 1607   ? ? ? ? ?PT/INR ?Recent Labs  ?  10/25/21 ?1247  ?LABPROT 13.0  ?INR 1.0  ? ?ABG ?No results for input(s): PHART, HCO3 in the last 72 hours. ? ?Invalid input(s): PCO2, PO2 ? ?Studies/Results: ?No results found. ? ? ?Assessment/Plan: ?Post operative epigastric pain ?POD 11 s/p Laparoscopic Cholecystectomy with IOC by Dr. Zenia Resides on 4/13 for Cholelithiasis (IOC negative)  ?- labs and imaging all normal dont see any post-operative issues ?- EGD 4/22 w/ - Subtle ringed appearance mucosa in the esophagus. Biopsied. Non-bleeding gastric ulcers. ?- continue SOFT diet. Continue PPI carafate. OP F/U w/ GI in 3-4 weeks ?- monitor PO intake and adjust PO antiemetics today. Hopefully home tomorrow if able to tolerate diet without emesis. ?  ?FEN - SOFT, miralax ?VTE - SCDs, lovenox ? LOS: 3 days  ? ? ?[place] ?10/28/2021 ? ?

## 2021-10-28 NOTE — Telephone Encounter (Signed)
-----   Message from Thornton Park, MD sent at 10/26/2021  2:21 PM EDT ----- ?Patient admitted with recurrent abdominal pain after cholecystectomy. Found to have 5 gastric ulcers without history of NSAIDs. EGD also showed a subtle ringed appearance to the esophagus. Gastric and esophageal biopsies pending.  ? ?Recommend: ?(1) Office follow-up with Dr. Candis Schatz or an APP in 3-4 weeks ?(2) EGD with Dr. Candis Schatz in 8-10 weeks to document healing ? ?Thank you! ? ?KLB ? ?

## 2021-10-28 NOTE — Care Management Important Message (Signed)
Important Message ? ?Patient Details  ?Name: Kelli Navarro ?MRN: [id number] ?Date of Birth: June 13, 1951 ? ? ?Medicare Important Message Given:  Yes ? ? ? ? ?Omarian Jaquith ?10/28/2021, 4:37 PM ?

## 2021-10-29 ENCOUNTER — Encounter (HOSPITAL_COMMUNITY): Payer: Self-pay | Admitting: Gastroenterology

## 2021-10-29 MED ORDER — CHOLESTYRAMINE 4 G PO PACK
4.0000 g | PACK | Freq: Three times a day (TID) | ORAL | Status: DC
  Administered 2021-10-29 – 2021-10-30 (×3): 4 g via ORAL
  Filled 2021-10-29 (×5): qty 1

## 2021-10-29 NOTE — Plan of Care (Signed)

## 2021-10-29 NOTE — Progress Notes (Signed)
3 Days Post-Op  ? ?Subjective/Chief Complaint: ?Repots abd pain is improving and manageable, ~5/10.  ?Was able to eat small breakfast, lunch, and a few crackers yesterday but developed significant nausea in the evening and was unable to eat dinner. Improved with IV compazine. Patient denies an increase in her abd pain with PO intake. Reports she is having flatus and one daily BM.  ? ? ?Objective: ?Vital signs in last 24 hours: ?Temp:  [98.1 ?F (36.7 ?C)-99.3 ?F (37.4 ?C)] 98.1 ?F (36.7 ?C) (04/25 0751) ?Pulse Rate:  [59-85] 66 (04/25 0751) ?Resp:  [16-18] 18 (04/25 0751) ?BP: (106-130)/(65-74) 120/73 (04/25 0751) ?SpO2:  [97 %-100 %] 100 % (04/25 0751) ?Last BM Date : 10/28/21 ? ?Intake/Output from previous day: ?No intake/output data recorded. ?Intake/Output this shift: ?No intake/output data recorded. ? ?PE: ? ?Constitutional: No acute distress, conversant, appears states age ?Eyes: Anicteric sclerae, moist conjunctiva, no lid lag ?Lungs: normal respiratory effort ?GI: Soft, no masses or hepatosplenomegaly, non-tender to palpation, incisions appear well healing ?Skin: No rashes, palpation reveals normal turgor ?Psychiatric: appropriate judgment and insight, oriented to person, place, and time; appears restless - moving and itching ? ?CMP  ?   ?Component Value Date/Time  ? NA 142 10/27/2021 0942  ? K 3.6 10/27/2021 0942  ? CL 116 (H) 10/27/2021 7672  ? CO2 20 (L) 10/27/2021 0942  ? GLUCOSE 105 (H) 10/27/2021 0942  ? BUN <5 (L) 10/27/2021 0942  ? CREATININE 0.81 10/27/2021 0942  ? CREATININE 0.84 01/05/2020 1607  ? CALCIUM 8.6 (L) 10/27/2021 0942  ? PROT 5.8 (L) 10/27/2021 0947  ? ALBUMIN 3.0 (L) 10/27/2021 0962  ? AST 14 (L) 10/27/2021 0942  ? AST 19 01/05/2020 1607  ? ALT 10 10/27/2021 0942  ? ALT 14 01/05/2020 1607  ? ALKPHOS 93 10/27/2021 0942  ? BILITOT 0.5 10/27/2021 0942  ? BILITOT 0.3 01/05/2020 1607  ? GFRNONAA >60 10/27/2021 0942  ? GFRNONAA >60 01/05/2020 1607  ? GFRAA >60 04/07/2020 1527  ? GFRAA >60  01/05/2020 1607  ? ? ? ? ?PT/INR ?No results for input(s): LABPROT, INR in the last 72 hours. ? ?ABG ?No results for input(s): PHART, HCO3 in the last 72 hours. ? ?Invalid input(s): PCO2, PO2 ? ?Studies/Results: ?No results found. ? ? ?Assessment/Plan: ?Post operative epigastric pain ?POD 12 s/p Laparoscopic Cholecystectomy with IOC by Dr. Zenia Resides on 4/13 for Cholelithiasis (IOC negative)  ?- labs and imaging all normal dont see any post-operative issues ?- EGD 4/22 w/ - Subtle ringed appearance mucosa in the esophagus. Biopsied. Non-bleeding gastric ulcers. ?- continue SOFT diet. Continue PPI carafate. OP F/U w/ GI in 3-4 weeks ?- monitor PO intake and add questran in an attempt to control nausea/sxs. Will also discuss with GI team and see if they have any recommendations regarding her ongoing nausea. I do not think it is explainable by her recent uncomplicated cholecystectomy and PUD alone.  ? ?Home once nausea more controlled.  ?  ?FEN - SOFT, miralax ?VTE - SCDs, lovenox ? LOS: 4 days  ? ? ?[place] ?10/29/2021 ? ?

## 2021-10-30 ENCOUNTER — Telehealth: Payer: Self-pay | Admitting: Physician Assistant

## 2021-10-30 LAB — SURGICAL PATHOLOGY

## 2021-10-30 MED ORDER — PROMETHAZINE HCL 12.5 MG PO TABS: 12.5000 mg | ORAL_TABLET | Freq: Two times a day (BID) | ORAL | 0 refills | Status: DC | PRN

## 2021-10-30 MED ORDER — SUCRALFATE 1 GM/10ML PO SUSP: 1.0000 g | Freq: Three times a day (TID) | ORAL | 0 refills | Status: DC

## 2021-10-30 MED ORDER — ONDANSETRON 4 MG PO TBDP: 4.0000 mg | ORAL_TABLET | Freq: Four times a day (QID) | ORAL | 0 refills | Status: DC | PRN

## 2021-10-30 MED ORDER — PANTOPRAZOLE SODIUM 40 MG PO TBEC: 40.0000 mg | DELAYED_RELEASE_TABLET | Freq: Two times a day (BID) | ORAL | 0 refills | Status: DC

## 2021-10-30 NOTE — Telephone Encounter (Signed)
Patient called requesting to speak with nurse. Per patient, saw Nicoletta Ba during admission at [facility], was prescribed Carafate but insurance won't cover the liquid form only covers the tablets. Patient also states during her admission, was given "medication in powder form" by [facility]. Patient was told a script for that medication would be sent to her pharmacy but pharmacy did not receive it. Please advise. ?

## 2021-10-30 NOTE — Progress Notes (Signed)
Nsg Discharge Note ? ?Admit Date:  10/24/2021 ?Discharge date: 10/30/2021 ?  ?[name] to be D/C'd Home per MD order.  AVS completed.   ?Removed Ivs-CDI. Reviewed d/c paperwork with patient and answered all questions. Wheeled stable patient and belongings to main entrance where she was picked up by her wife. ? ?Discharge Medication: ?Allergies as of 10/30/2021   ? ?   Reactions  ? Hydrocortisone Anaphylaxis  ? Other Anaphylaxis  ? 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 Aptiom [eslicarbazepine] Other (See Comments)  ? Unknown reaction  ? Azithromycin Other (See Comments)  ? Unknown reaction  ? Bupropion Other (See Comments)  ?  Caused depression  ? Carbamazepine Other (See Comments)  ? Unknown reaction  ? Dilantin [phenytoin Sodium Extended] Other (See Comments)  ? Unknown reaction  ? Diphenhydramine Other (See Comments)  ? Feels like "spiders are crawling" on her  ? Divalproex Sodium Other (See Comments)  ? Unknown reaction  ? Keppra [levetiracetam] Other (See Comments)  ? Unknown reaction  ? Lamictal [lamotrigine] Other (See Comments)  ? Unknown reaction  ? Lorazepam Swelling, Other (See Comments)  ? Hallucinations  ? Lyrica [pregabalin] Other (See Comments)  ? Pt reports feeling loopy and confused  ? Morphine And Related Other (See Comments)  ? Unknown reaction - (high doses)  ? Prednisone Other (See Comments)  ? Unknown reaction  ? Vimpat [lacosamide] Other (See Comments)  ? Unknown reaction  ? ?  ? ?  ?Medication List  ?  ? ?TAKE these medications   ? ?acetaminophen 500 MG tablet ?Commonly known as: TYLENOL ?Take 1,000 mg by mouth every 6 (six) hours as needed for moderate pain or headache (pain). ?  ?amphetamine-dextroamphetamine 20 MG tablet ?Commonly known as: Adderall ?Take 1 tablet (20 mg total) by mouth 2 (two) times daily. May take an additional tablet as needed ?  ?Belsomra 20 MG Tabs ?Generic drug: Suvorexant ?Take 1 tablet by mouth at bedtime. ?  ?cloNIDine 0.1 MG tablet ?Commonly known  as: CATAPRES ?Take 1 tablet (0.1 mg total) by mouth 2 (two) times daily. ?  ?[facility] Aspirin Adult Low Dose 81 MG chewable tablet ?Generic drug: aspirin ?Chew 81 mg by mouth daily. ?  ?ipratropium 0.06 % nasal spray ?Commonly known as: ATROVENT ?Place 2 sprays into both nostrils daily as needed (allergies). ?  ?multivitamin with minerals Tabs tablet ?Take 1 tablet by mouth daily. ?  ?ondansetron 4 MG disintegrating tablet ?Commonly known as: ZOFRAN-ODT ?Take 1 tablet (4 mg total) by mouth every 6 (six) hours as needed for nausea or vomiting. ?  ?pantoprazole 40 MG tablet ?Commonly known as: PROTONIX ?Take 1 tablet (40 mg total) by mouth 2 (two) times daily. ?  ?polyethylene glycol 17 g packet ?Commonly known as: MIRALAX / GLYCOLAX ?Take 17 g by mouth daily as needed for mild constipation. ?  ?promethazine 12.5 MG tablet ?Commonly known as: PHENERGAN ?Take 1 tablet (12.5 mg total) by mouth every 12 (twelve) hours as needed for refractory nausea / vomiting (if failed zofran). ?  ?sucralfate 1 GM/10ML suspension ?Commonly known as: CARAFATE ?Take 10 mLs (1 g total) by mouth 4 (four) times daily -  with meals and at bedtime. ?  ?topiramate 100 MG tablet ?Commonly known as: TOPAMAX ?Take 2 tabs twice a day ?What changed:  ?how much to take ?how to take this ?when to take this ?additional instructions ?  ?traMADol 50 MG tablet ?Commonly known as: ULTRAM ?Take 1 tablet (50 mg  total) by mouth every 6 (six) hours as needed for severe pain. ?  ? ?  ? ? ?Discharge Assessment: ?Vitals:  ? 10/29/21 2027 10/30/21 0418  ?BP: 140/77 119/73  ?Pulse: 78 63  ?Resp: 15 14  ?Temp: 98.6 ?F (37 ?C) (!) 97.5 ?F (36.4 ?C)  ?SpO2: 100% 98%  ? Skin clean, dry and intact without evidence of skin break down, no evidence of skin tears noted. ?IV catheter discontinued intact. Site without signs and symptoms of complications - no redness or edema noted at insertion site, patient denies c/o pain - only slight tenderness at site.  Dressing with slight  pressure applied. ? ?D/c Instructions-Education: ?Discharge instructions given to patient/family with verbalized understanding. ?D/c education completed with patient/family including follow up instructions, medication list, d/c activities limitations if indicated, with other d/c instructions as indicated by MD - patient able to verbalize understanding, all questions fully answered. ?Patient instructed to return to ED, call 911, or call MD for any changes in condition.  ?Patient escorted via [place], and D/C home via private auto. ? ?Santa Lighter, RN ?10/30/2021 1:01 PM  ?

## 2021-10-30 NOTE — Telephone Encounter (Signed)
Amy ?I can see the hospitalist prescribed pantoprazole, Miralax and Carafate. Can I contact the pharmacy and try to help this patient get her medications? And use your name to prescribe? ?

## 2021-10-30 NOTE — Progress Notes (Signed)
Tolerating PO ?No vomiting for over 24 hours ?Nausea slowly improving. ?Pain improving. ?Patient stable for discharge. ? ? ?Exam - alert, nontoxic, NAD ?Abdominal exam - soft, non-tender, trochar sites c/d/I without cellulitis ? ? ?Formal discharge summary to follow ?Outpatient follow up with [facility] in 9 days and GI in 3 weeks.  ? ? ?Obie Dredge, PA-C ?[facility] ?Please see Amion for pager number during day hours 7:00am-4:30pm ? ? ? ? ? ?

## 2021-10-31 ENCOUNTER — Other Ambulatory Visit: Payer: Self-pay | Admitting: General Surgery

## 2021-10-31 ENCOUNTER — Other Ambulatory Visit: Payer: Self-pay

## 2021-10-31 MED ORDER — SUCRALFATE 1 G PO TABS: 1.0000 g | ORAL_TABLET | Freq: Three times a day (TID) | ORAL | 0 refills | Status: DC

## 2021-10-31 NOTE — Telephone Encounter (Addendum)
Called Kelli Navarro again. No answer. Left her the information on the voicemail. ?

## 2021-10-31 NOTE — Telephone Encounter (Signed)
Spoke with patient's wife [name]. Patient is talking in the background.  ?She is concerned because the patient is very tired and does not feel good. No specific complaint. No pain. No vomiting. No nausea. Apparently had problems with nausea in the hospital. Reviewed medications. The patient has taken both ondansetron and promethazine together. She thought they had to be taken on a scheduled basis. Explained side effect of both is fatigue or sleepiness. Explained what if would mean to use as needed. Discussed sucralfate tablets and how to make the slurry. ?She will contact the pharmacy about this prescription and let me know if it is not covered by the insurance.  ?The wife asks about a medication the patient was taking in the hospital for the liver. She does not know what it was or why she is not on it now. I cannot find the medications she is describing. Her LFT's were normal. ?Thanks ?

## 2021-10-31 NOTE — Telephone Encounter (Signed)
[facility]oke with [facility] tech. The patient picked up pantoprazole, promethazine and ondansetron. Sucralfate liquid and Miralax is not covered by her insurance. Instructed tech to cancel the sucralfate liquid and to explain to the patient Miralax is not covered because it is an OTC. Asked she direct the patient to Miralax and the store brands. ?Prescription for Sucralfate tablets transmitted to the pharmacy. ?

## 2021-10-31 NOTE — Telephone Encounter (Signed)
Called the patient. No answer. Left a message on the voicemail asking she return my call.  ?I will want to confirm the pharmacy.  ?She will need instruction on creating slurry of the Carafate using tablets. Miralax is likely the powder she used in the hospital. It is an OTC and may not be covered by her insurance.  ?I do not know why she would not be able to pick up pantoprazole.  ?

## 2021-10-31 NOTE — Telephone Encounter (Signed)
Dr Candis Schatz patient. ?

## 2021-11-05 ENCOUNTER — Encounter: Payer: Self-pay | Admitting: Psychiatry

## 2021-11-05 ENCOUNTER — Encounter: Payer: Self-pay | Admitting: Gastroenterology

## 2021-11-05 ENCOUNTER — Ambulatory Visit (INDEPENDENT_AMBULATORY_CARE_PROVIDER_SITE_OTHER): Payer: Medicare HMO | Admitting: Psychiatry

## 2021-11-05 DIAGNOSIS — F5101 Primary insomnia: Secondary | ICD-10-CM | POA: Diagnosis not present

## 2021-11-05 DIAGNOSIS — F902 Attention-deficit hyperactivity disorder, combined type: Secondary | ICD-10-CM

## 2021-11-05 DIAGNOSIS — F419 Anxiety disorder, unspecified: Secondary | ICD-10-CM

## 2021-11-05 MED ORDER — BELSOMRA 20 MG PO TABS: 1.0000 | ORAL_TABLET | Freq: Every day | ORAL | 2 refills | Status: DC

## 2021-11-05 MED ORDER — CLONIDINE HCL 0.1 MG PO TABS: 0.1000 mg | ORAL_TABLET | Freq: Two times a day (BID) | ORAL | 0 refills | Status: DC

## 2021-11-05 MED ORDER — AMPHETAMINE-DEXTROAMPHETAMINE 20 MG PO TABS: 20.0000 mg | ORAL_TABLET | Freq: Two times a day (BID) | ORAL | 0 refills | Status: DC

## 2021-11-05 NOTE — Progress Notes (Signed)
Kelli Navarro ?[id number] ?1950/08/06 ?71 y.o. ? ?Virtual Visit via Telephone Note ? ?I connected with pt on 11/05/21 at  1:30 PM EDT by telephone and verified that I am speaking with the correct person using two identifiers. ?  ?I discussed the limitations, risks, security and privacy concerns of performing an evaluation and management service by telephone and the availability of in person appointments. I also discussed with the patient that there may be a patient responsible charge related to this service. The patient expressed understanding and agreed to proceed. ?  ?I discussed the assessment and treatment plan with the patient. The patient was provided an opportunity to ask questions and all were answered. The patient agreed with the plan and demonstrated an understanding of the instructions. ?  ?The patient was advised to call back or seek an in-person evaluation if the symptoms worsen or if the condition fails to improve as anticipated. ? ?I provided 25 minutes of non-face-to-face time during this encounter.  The patient was located at home.  The provider was located at [place]. ? ? ?Thayer Headings, PMHNP ? ? ?Subjective:  ? ?Patient ID:  Kelli Navarro is a 71 y.o. (DOB 06/05/1951) female. ? ?Chief Complaint:  ?Chief Complaint  ?Patient presents with  ? Follow-up  ?  Insomnia, anxiety, and mood disturbance  ? ? ?HPI ?Kelli Navarro presents for follow-up of anxiety, ADHD, and mood disturbance. She reports that she was recently hospitalized. She had her gallbladder removed. She had to return to the ER a few days later. She reports that she had increased stress and developed ulcers. She has had some GI pain and fatigue. She has been continuing to stay in bed. She reports that her mood has been "ok." She has had some anxiety during acute GI illness. She reports that her anxiety has improved. She reports that her sleep was disturbed during hospitalization. She has been using Belsomra to  help with sleep during her recovery. She reports that Belsomra is only partially covered by her insurance and may not be able to continue it long-term. Sleeps from 9-10 pm and then sleeps from 3-7 am. Energy has been ok. She reports, "my brain is going 90 mph" and is physically tired. She has not been able to be physically active which is typically helpful for ADHD s/s. Appetite has been decreased. She is avoiding raw vegetables. Weight has been stable. Denies SI.  ? ?Review of Systems:  ?Review of Systems  ?Constitutional:  Positive for fatigue.  ?Gastrointestinal:  Positive for abdominal pain.  ?Musculoskeletal:  Negative for gait problem.  ?Psychiatric/Behavioral:    ?     Please refer to HPI  ? ?Medications: I have reviewed the patient's current medications. ? ?Current Outpatient Medications  ?Medication Sig Dispense Refill  ? [START ON 12/13/2021] amphetamine-dextroamphetamine (ADDERALL) 20 MG tablet Take 1 tablet (20 mg total) by mouth 2 (two) times daily. May also take 1 tab as needed. 75 tablet 0  ? [START ON 01/10/2022] amphetamine-dextroamphetamine (ADDERALL) 20 MG tablet Take 1 tablet (20 mg total) by mouth 2 (two) times daily. May take 1 additional tab as needed 75 tablet 0  ? [facility] ASPIRIN ADULT LOW DOSE 81 MG chewable tablet Chew 81 mg by mouth daily.    ? ipratropium (ATROVENT) 0.06 % nasal spray Place 2 sprays into both nostrils daily as needed (allergies).    ? Multiple Vitamin (MULTIVITAMIN WITH MINERALS) TABS tablet Take 1 tablet by mouth daily.    ?  ondansetron (ZOFRAN-ODT) 4 MG disintegrating tablet Take 1 tablet (4 mg total) by mouth every 6 (six) hours as needed for nausea or vomiting. 20 tablet 0  ? pantoprazole (PROTONIX) 40 MG tablet Take 1 tablet (40 mg total) by mouth 2 (two) times daily. 60 tablet 0  ? promethazine (PHENERGAN) 12.5 MG tablet Take 1 tablet (12.5 mg total) by mouth every 12 (twelve) hours as needed for refractory nausea / vomiting (if failed zofran). 15 tablet 0  ? sucralfate  (CARAFATE) 1 g tablet Take 1 tablet (1 g total) by mouth 4 (four) times daily -  with meals and at bedtime. Crush and mix with water to make slurry 120 tablet 0  ? topiramate (TOPAMAX) 100 MG tablet Take 2 tabs twice a day (Patient taking differently: Take 200 mg by mouth 2 (two) times daily.) 360 tablet 3  ? traMADol (ULTRAM) 50 MG tablet Take 1 tablet (50 mg total) by mouth every 6 (six) hours as needed for severe pain. 15 tablet 0  ? acetaminophen (TYLENOL) 500 MG tablet Take 1,000 mg by mouth every 6 (six) hours as needed for moderate pain or headache (pain). (Patient not taking: Reported on 11/05/2021)    ? [START ON 11/15/2021] amphetamine-dextroamphetamine (ADDERALL) 20 MG tablet Take 1 tablet (20 mg total) by mouth 2 (two) times daily. May take an additional tablet as needed 75 tablet 0  ? cloNIDine (CATAPRES) 0.1 MG tablet Take 1 tablet (0.1 mg total) by mouth 2 (two) times daily. 180 tablet 0  ? polyethylene glycol (MIRALAX / GLYCOLAX) 17 g packet Take 17 g by mouth daily as needed for mild constipation. (Patient not taking: Reported on 11/05/2021) 14 each 0  ? Suvorexant (BELSOMRA) 20 MG TABS Take 1 tablet by mouth at bedtime. 30 tablet 2  ? ?No current facility-administered medications for this visit.  ? ? ?Medication Side Effects: None ? ?Allergies:  ?Allergies  ?Allergen Reactions  ? Hydrocortisone Anaphylaxis  ? Other Anaphylaxis  ?  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 Aptiom [Eslicarbazepine] Other (See Comments)  ?  Unknown reaction  ? Azithromycin Other (See Comments)  ?  Unknown reaction ?  ? Bupropion Other (See Comments)  ?   Caused depression  ? Carbamazepine Other (See Comments)  ?  Unknown reaction ?  ? Dilantin [Phenytoin Sodium Extended] Other (See Comments)  ?  Unknown reaction ?  ? Diphenhydramine Other (See Comments)  ?  Feels like "spiders are crawling" on her  ? Divalproex Sodium Other (See Comments)  ?  Unknown reaction ?  ? Keppra [Levetiracetam] Other (See Comments)  ?  Unknown  reaction ?  ? Lamictal [Lamotrigine] Other (See Comments)  ?  Unknown reaction ?  ? Lorazepam Swelling and Other (See Comments)  ?  Hallucinations  ? Lyrica [Pregabalin] Other (See Comments)  ?  Pt reports feeling loopy and confused  ? Morphine And Related Other (See Comments)  ?  Unknown reaction - (high doses)  ? Prednisone Other (See Comments)  ?  Unknown reaction ?  ? Vimpat [Lacosamide] Other (See Comments)  ?  Unknown reaction ?  ? ? ?Past Medical History:  ?Diagnosis Date  ? ADHD   ? Anxiety   ? Chronic back pain   ? from broken back  ? Depression   ? Right femoral fracture ([place])   ? [facility] spotted fever   ? may show low Hgb  ? Seizures ([place])   ? has during sleep  ?  Vitamin B 12 deficiency   ? ? ?Family History  ?Problem Relation Age of Onset  ? Multiple sclerosis Mother   ? Heart Problems Mother   ?     by-pass  ? Alcohol abuse Mother   ? Heart Problems Sister   ?     pacemaker  ? ? ?Social History  ? ?Socioeconomic History  ? Marital status: Married  ?  Spouse name: Not on file  ? Number of children: 2  ? Years of education: 45  ? Highest education level: Not on file  ?Occupational History  ? Occupation: retired  ?Tobacco Use  ? Smoking status: Former  ?  Packs/day: 0.50  ?  Years: 1.00  ?  Pack years: 0.50  ?  Types: Cigarettes  ? Smokeless tobacco: Never  ?Vaping Use  ? Vaping Use: Never used  ?Substance and Sexual Activity  ? Alcohol use: No  ? Drug use: No  ? Sexual activity: Not on file  ?Other Topics Concern  ? Not on file  ?Social History Narrative  ? Pt lives in 2 story home with her wife  ? Has 2 adult sons  ? Roughly 6 years olf college - no degree  ? 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 Right handed   ? ?Social Determinants of Health  ? ?Financial Resource Strain: Not on file  ?Food Insecurity: Not on file  ?Transportation Needs: Not on file  ?Physical Activity: Not on file  ?Stress: Not on file  ?Social Connections: Not on file  ?Intimate Partner Violence: Not on file  ? ? ?Past Medical  History, Surgical history, Social history, and Family history were reviewed and updated as appropriate.  ? ?Please see review of systems for further details on the patient's review from today.  ? ?Objecti

## 2021-11-06 ENCOUNTER — Telehealth: Payer: Self-pay | Admitting: Psychiatry

## 2021-11-06 MED ORDER — AMPHETAMINE-DEXTROAMPHETAMINE 20 MG PO TABS: 20.0000 mg | ORAL_TABLET | Freq: Two times a day (BID) | ORAL | 0 refills | Status: DC

## 2021-11-06 NOTE — Progress Notes (Signed)
Letter sent via My Chart and mailed to home address. ? ?

## 2021-11-06 NOTE — Telephone Encounter (Signed)
Advised Pt Rx here to pick up. ?

## 2021-11-13 ENCOUNTER — Other Ambulatory Visit: Payer: Self-pay | Admitting: Psychiatry

## 2021-11-13 DIAGNOSIS — F5101 Primary insomnia: Secondary | ICD-10-CM

## 2021-11-19 ENCOUNTER — Ambulatory Visit: Payer: Medicare HMO | Admitting: Gastroenterology

## 2021-11-20 ENCOUNTER — Other Ambulatory Visit: Payer: Self-pay

## 2021-11-20 MED ORDER — PROMETHAZINE HCL 12.5 MG PO TABS: 12.5000 mg | ORAL_TABLET | Freq: Two times a day (BID) | ORAL | 0 refills | Status: DC | PRN

## 2021-11-20 NOTE — Telephone Encounter (Signed)
Patient called states she is still having upper GI issues ( trouble swallowing) and would like a refill on the Phenergan. Requested a call back to advise. ? ? ?

## 2021-11-20 NOTE — Telephone Encounter (Signed)
Doc of the day. ? ?She is a patient of Dr Candis Schatz. Her follow up appointment was rescheduled to 11/26/21 from 11/19/21. ?Patient was scoped in the hospital by Dr Tarri Glenn and found to have gastric ulcers. The plan is to bring her into the office for recheck and to repeat her EGD in June to check for healing.  ?Patient calls to request a refill of promethazine. She takes this when she begins "spitting up" and Zofran does not help. She says she will spit up even plain saliva. She feels nauseated. She is "certain there is nothing stuck in my throat."  ? ?May I refill the Phenergan? ? ?

## 2021-11-20 NOTE — Telephone Encounter (Signed)
Patient advised of the refill. ?

## 2021-11-21 ENCOUNTER — Telehealth: Payer: Self-pay | Admitting: Gastroenterology

## 2021-11-21 ENCOUNTER — Emergency Department (HOSPITAL_COMMUNITY)
Admission: EM | Admit: 2021-11-21 | Discharge: 2021-11-21 | Disposition: A | Discharge status: Home / Self Care | Payer: Medicare HMO | Attending: Emergency Medicine | Admitting: Emergency Medicine

## 2021-11-21 ENCOUNTER — Emergency Department (HOSPITAL_COMMUNITY): Payer: Medicare HMO

## 2021-11-21 ENCOUNTER — Encounter (HOSPITAL_COMMUNITY): Payer: Self-pay | Admitting: Emergency Medicine

## 2021-11-21 DIAGNOSIS — I10 Essential (primary) hypertension: Secondary | ICD-10-CM | POA: Diagnosis not present

## 2021-11-21 DIAGNOSIS — E860 Dehydration: Secondary | ICD-10-CM

## 2021-11-21 DIAGNOSIS — R112 Nausea with vomiting, unspecified: Secondary | ICD-10-CM | POA: Diagnosis present

## 2021-11-21 DIAGNOSIS — R197 Diarrhea, unspecified: Secondary | ICD-10-CM | POA: Insufficient documentation

## 2021-11-21 DIAGNOSIS — K5909 Other constipation: Secondary | ICD-10-CM

## 2021-11-21 DIAGNOSIS — Z79899 Other long term (current) drug therapy: Secondary | ICD-10-CM | POA: Diagnosis not present

## 2021-11-21 DIAGNOSIS — N200 Calculus of kidney: Secondary | ICD-10-CM

## 2021-11-21 DIAGNOSIS — E870 Hyperosmolality and hypernatremia: Secondary | ICD-10-CM | POA: Diagnosis not present

## 2021-11-21 DIAGNOSIS — Z7982 Long term (current) use of aspirin: Secondary | ICD-10-CM | POA: Insufficient documentation

## 2021-11-21 DIAGNOSIS — K209 Esophagitis, unspecified without bleeding: Secondary | ICD-10-CM

## 2021-11-21 DIAGNOSIS — R1084 Generalized abdominal pain: Secondary | ICD-10-CM | POA: Insufficient documentation

## 2021-11-21 DIAGNOSIS — R109 Unspecified abdominal pain: Secondary | ICD-10-CM

## 2021-11-21 LAB — CBC
HCT: 42.7 % (ref 36.0–46.0)
Hemoglobin: 12.8 g/dL (ref 12.0–15.0)
MCH: 27.6 pg (ref 26.0–34.0)
MCHC: 30 g/dL (ref 30.0–36.0)
MCV: 92.2 fL (ref 80.0–100.0)
Platelets: 280 10*3/uL (ref 150–400)
RBC: 4.63 MIL/uL (ref 3.87–5.11)
RDW: 15.1 % (ref 11.5–15.5)
WBC: 5.5 10*3/uL (ref 4.0–10.5)
nRBC: 0 % (ref 0.0–0.2)

## 2021-11-21 LAB — COMPREHENSIVE METABOLIC PANEL
ALT: 9 U/L (ref 0–44)
AST: 15 U/L (ref 15–41)
Albumin: 4.3 g/dL (ref 3.5–5.0)
Alkaline Phosphatase: 95 U/L (ref 38–126)
Anion gap: 14 (ref 5–15)
BUN: 18 mg/dL (ref 8–23)
CO2: 20 mmol/L — ABNORMAL LOW (ref 22–32)
Calcium: 9.9 mg/dL (ref 8.9–10.3)
Chloride: 115 mmol/L — ABNORMAL HIGH (ref 98–111)
Creatinine, Ser: 0.97 mg/dL (ref 0.44–1.00)
GFR, Estimated: >60 mL/min (ref >60)
Glucose, Bld: 129 mg/dL — ABNORMAL HIGH (ref 70–99)
Potassium: 3.5 mmol/L (ref 3.5–5.1)
Sodium: 149 mmol/L — ABNORMAL HIGH (ref 135–145)
Total Bilirubin: 1 mg/dL (ref 0.3–1.2)
Total Protein: 7.8 g/dL (ref 6.5–8.1)

## 2021-11-21 LAB — URINALYSIS, ROUTINE W REFLEX MICROSCOPIC
Bacteria, UA: NONE SEEN
Bilirubin Urine: NEGATIVE
Glucose, UA: NEGATIVE mg/dL
Hgb urine dipstick: NEGATIVE
Ketones, ur: 80 mg/dL — AB
Leukocytes,Ua: NEGATIVE
Nitrite: NEGATIVE
Protein, ur: 30 mg/dL — AB
Specific Gravity, Urine: 1.014 (ref 1.005–1.030)
pH: 6 (ref 5.0–8.0)

## 2021-11-21 LAB — LIPASE, BLOOD: Lipase: 22 U/L (ref 11–51)

## 2021-11-21 LAB — MAGNESIUM: Magnesium: 2.3 mg/dL (ref 1.7–2.4)

## 2021-11-21 MED ORDER — IOHEXOL 300 MG/ML  SOLN: 100.0000 mL | Freq: Once | INTRAMUSCULAR | Status: DC | PRN

## 2021-11-21 MED ORDER — LACTATED RINGERS IV BOLUS
2000.0000 mL | Freq: Once | INTRAVENOUS | Status: AC
  Administered 2021-11-21: 2000 mL via INTRAVENOUS

## 2021-11-21 MED ORDER — IOHEXOL 300 MG/ML  SOLN
100.0000 mL | Freq: Once | INTRAMUSCULAR | Status: AC | PRN
Start: 2021-11-21 — End: 2021-11-21
  Administered 2021-11-21: 100 mL via INTRAVENOUS

## 2021-11-21 MED ORDER — PROCHLORPERAZINE EDISYLATE 10 MG/2ML IJ SOLN
10.0000 mg | Freq: Once | INTRAMUSCULAR | Status: AC
  Administered 2021-11-21: 10 mg via INTRAVENOUS
  Filled 2021-11-21: qty 2

## 2021-11-21 MED ORDER — METOCLOPRAMIDE HCL 5 MG/ML IJ SOLN
5.0000 mg | Freq: Once | INTRAMUSCULAR | Status: AC
  Administered 2021-11-21: 5 mg via INTRAVENOUS
  Filled 2021-11-21: qty 2

## 2021-11-21 MED ORDER — ONDANSETRON HCL 4 MG/2ML IJ SOLN
4.0000 mg | Freq: Once | INTRAMUSCULAR | Status: AC
  Administered 2021-11-21: 4 mg via INTRAVENOUS
  Filled 2021-11-21: qty 2

## 2021-11-21 NOTE — Telephone Encounter (Signed)
Noted  

## 2021-11-21 NOTE — Discharge Summary (Signed)
[facility] Discharge Summary   Patient ID: FLORIS Navarro MRN: [id number] DOB/AGE: 71-Jul-1952 71 y.o.  Admit date: 10/24/2021 Discharge date: 10/30/2021  Admitting Diagnosis: Post-operative pain and nausea  Discharge Diagnosis Patient Active Problem List   Diagnosis Date Noted   Gastric ulcer without hemorrhage or perforation    Abdominal pain 10/24/2021   Symptomatic cholelithiasis 10/17/2021   Suicide attempt by drug overdose ([place]) 09/11/2020   Severe episode of recurrent major depressive disorder, without psychotic features ([place])    Generalized anxiety disorder    Obtundation    Overdose 09/08/2020   Fracture of femoral neck, right ([place]) 08/16/2020   Displaced fracture of right femoral neck ([place]) 08/16/2020   AMS (altered mental status) 04/07/2020   ADHD    Fall    Mood disorder ([place]) 05/25/2018   Insomnia 05/25/2018   Seizure disorder ([place])    Displaced fracture of left femoral neck ([place]) 03/22/2017   Left displaced femoral neck fracture ([place]) 03/21/2017   Fracture of 5th metatarsal 03/21/2017   Hypokalemia 03/21/2017   Femur fracture ([place]) 03/21/2017   Foot drop 03/21/2017   Hypertension    Chronic back pain    Adjustment disorder with disturbance of emotion 01/01/2017   Seizure-like activity ([place]) 10/17/2012    Consultants GI   Imaging: No results found.  Procedures Upper endoscopy 10/26/21 Dr. Tarri Glenn  HPI: Kelli Navarro is a 71 y.o. female who is s/p Laparoscopic Cholecystectomy by Dr. Zenia Resides on 4/13 for Cholelithiasis (IOC negative) who presented to the ED on 4/20 for abdominal pain. Patient reports she started having sharp, stabbing, constant RUQ abdominal pain last night rated as a 8-9/10, unrelieved by tylenol or tramadol. She called the office and was seen in urgent office on 4/20. She was sent to the ED for CT   A/P. Hospital Course:  Workup including CT and labs did not show any post-operative complication. GI was consulted  and the patient underwent upper endoscopy where gastric ulcers were noted. She was started on PPOI and carafate. PRN antiemetics. Diet was advanced as tolerated.  On 10/30/21 the patient was voiding well, tolerating diet, ambulating well, pain well controlled, vital signs stable, and felt stable for discharge home.  Patient will follow up in our office as below and knows to call with questions or concerns.   Allergies as of 10/30/2021       Reactions   Hydrocortisone Anaphylaxis   Other Anaphylaxis   Allergy to steroids causing anaphylaxis     Aptiom [eslicarbazepine] Other (See Comments)   Unknown reaction   Azithromycin Other (See Comments)   Unknown reaction   Bupropion Other (See Comments)    Caused depression   Carbamazepine Other (See Comments)   Unknown reaction   Dilantin [phenytoin Sodium Extended] Other (See Comments)   Unknown reaction   Diphenhydramine Other (See Comments)   Feels like "spiders are crawling" on her   Divalproex Sodium Other (See Comments)   Unknown reaction   Keppra [levetiracetam] Other (See Comments)   Unknown reaction   Lamictal [lamotrigine] Other (See Comments)   Unknown reaction   Lorazepam Swelling, Other (See Comments)   Hallucinations   Lyrica [pregabalin] Other (See Comments)   Pt reports feeling loopy and confused   Morphine And Related Other (See Comments)   Unknown reaction - (high doses)   Prednisone Other (See Comments)   Unknown reaction   Vimpat [lacosamide] Other (See Comments)   Unknown reaction        Medication  List     TAKE these medications    acetaminophen 500 MG tablet Commonly known as: TYLENOL Take 1,000 mg by mouth every 6 (six) hours as needed for moderate pain or headache (pain).   [facility] Aspirin Adult Low Dose 81 MG chewable tablet Generic drug: aspirin Chew 81 mg by mouth daily.   ipratropium 0.06 % nasal spray Commonly known as: ATROVENT Place 2 sprays into both nostrils daily as needed (allergies).    multivitamin with minerals Tabs tablet Take 1 tablet by mouth daily.   ondansetron 4 MG disintegrating tablet Commonly known as: ZOFRAN-ODT Take 1 tablet (4 mg total) by mouth every 6 (six) hours as needed for nausea or vomiting.   pantoprazole 40 MG tablet Commonly known as: PROTONIX Take 1 tablet (40 mg total) by mouth 2 (two) times daily.   polyethylene glycol 17 g packet Commonly known as: MIRALAX / GLYCOLAX Take 17 g by mouth daily as needed for mild constipation.   topiramate 100 MG tablet Commonly known as: TOPAMAX Take 2 tabs twice a day What changed:  how much to take how to take this when to take this additional instructions   traMADol 50 MG tablet Commonly known as: ULTRAM Take 1 tablet (50 mg total) by mouth every 6 (six) hours as needed for severe pain.          Follow-up Information     Maczis, Carlena Hurl, [place]. Go on 11/07/2021.   Specialty: General Surgery Why: for post-operative follow up. please call to confirm appointment date/time Contact information: [address] [place] [address] [phone]                 Signed: Obie Dredge, [facility] 11/21/2021, 10:07 AM

## 2021-11-21 NOTE — Telephone Encounter (Signed)
We received a call from patient spouse stating patient has been vomiting nonstop and is now very weak. Patient can't keep medicine down. Per pt spouse, will call EMS to take to the ED. Spouse is requesting we call the ED and let them know they are coming so they can be seen immediately. Spouse states "that's what they did last time and she was immediately roomed." Please advise.

## 2021-11-21 NOTE — ED Triage Notes (Signed)
Patient BIB [facility] from home for nausea and vomiting x2 days. Patient reports similar episode in the past that resulted in hypokalemia. Patient  is alert, oriented, and in no apparent distress at this time.  EMS Vitals 132/78 HR 67 SpO2 99% on room air CBG 115

## 2021-11-21 NOTE — Discharge Instructions (Addendum)
It was our pleasure to provide your ER care today - we hope that you feel better.  Drink plenty of fluids/stay well hydrated.   You scan showed 'esophagitis' and constipation - continue protonix (acid blocker). You may also try pepcid or maalox as need, and take zofran as need for nausea.   If constipated - make sure to stay well hydrated, get adequate fiber in diet, take colace (stool softener) 2x/day, and miralax (laxative) once per day as need.   Follow up with primary care doctor in the coming week - have your labs/sodium level rechecked then.  Return to ER if worse, new symptoms, fevers, new/severe pain, persistent vomiting, or other concern.

## 2021-11-21 NOTE — ED Provider Triage Note (Signed)
Emergency Medicine Provider Triage Evaluation Note  Kelli Navarro , a 71 y.o. female  was evaluated in triage.  Pt complains of abdominal pain, vomiting, diarrhea.  Symptoms going on for 2 days.  Unable to keep down her home Phenergan.  Reports body aches and lightheadedness as well.  Review of Systems  Positive: Abdominal pain, vomiting, diarrhea Negative: Fever  Physical Exam  BP (!) 158/84 (BP Location: Right Arm)   Pulse 92   Temp 98.6 F (37 C) (Oral)   Resp (!) 28   SpO2 100%  Gen:   Awake, no distress   Resp:  Normal effort  MSK:   Moves extremities without difficulty  Other:  Abdomen is generally tender  Medical Decision Making  Medically screening exam initiated at 1:39 PM.  Appropriate orders placed.  Kelli Navarro was informed that the remainder of the evaluation will be completed by another provider, this initial triage assessment does not replace that evaluation, and the importance of remaining in the ED until their evaluation is complete.  Labs ordered   Delia Heady, [place] 11/21/21 1340

## 2021-11-21 NOTE — ED Provider Notes (Signed)
[facility] EMERGENCY DEPARTMENT Provider Note   CSN: [id number] Arrival date & time: 11/21/21  1325     History  Chief Complaint  Patient presents with   Emesis    Kelli Navarro is a 71 y.o. female.   Emesis Associated symptoms: no fever   Patient has a history of adjustment disorder, seizure, hypertension, femur fracture, symptomatic cholelithiasis, gastric ulcers, status post cholecystectomy in April of this year and an EGD also in April of this year.  Patient states she has been having issues with recurrent nausea and vomiting.  Patient was last admitted to the hospital on April 20 when she had her EGD procedure.  This was performed by Dr. Tarri Glenn.  Patient starting having issues with recurrent nausea vomiting and abdominal pain over the last couple of days.  Patient states the symptoms are severe.  She is hurting all over.  She has not been able to eat or drink.  She has also had loose stools.  She denies any fevers or chills.  Patient tried taking her Phenergan medication at home without relief.    Home Medications Prior to Admission medications   Medication Sig Start Date End Date Taking? Authorizing Provider  acetaminophen (TYLENOL) 500 MG tablet Take 1,000 mg by mouth every 6 (six) hours as needed for moderate pain or headache (pain). Patient not taking: Reported on 11/05/2021    [provider]  amphetamine-dextroamphetamine (ADDERALL) 20 MG tablet Take 1 tablet (20 mg total) by mouth 2 (two) times daily. May take an additional tablet as needed 11/15/21   Thayer Headings, PMHNP  amphetamine-dextroamphetamine (ADDERALL) 20 MG tablet Take 1 tablet (20 mg total) by mouth 2 (two) times daily. May also take 1 tab as needed. 12/13/21   Thayer Headings, PMHNP  amphetamine-dextroamphetamine (ADDERALL) 20 MG tablet Take 1 tablet (20 mg total) by mouth 2 (two) times daily. May take 1 additional tab as needed 01/10/22   Thayer Headings, PMHNP  cloNIDine  (CATAPRES) 0.1 MG tablet Take 1 tablet (0.1 mg total) by mouth 2 (two) times daily. 11/05/21   Thayer Headings, PMHNP  [facility] ASPIRIN ADULT LOW DOSE 81 MG chewable tablet Chew 81 mg by mouth daily. 05/09/21   [provider]  ipratropium (ATROVENT) 0.06 % nasal spray Place 2 sprays into both nostrils daily as needed (allergies). 10/11/19   [provider]  Multiple Vitamin (MULTIVITAMIN WITH MINERALS) TABS tablet Take 1 tablet by mouth daily.    [provider]  ondansetron (ZOFRAN-ODT) 4 MG disintegrating tablet Take 1 tablet (4 mg total) by mouth every 6 (six) hours as needed for nausea or vomiting. 10/30/21   Jill Alexanders, PA-C  pantoprazole (PROTONIX) 40 MG tablet Take 1 tablet (40 mg total) by mouth 2 (two) times daily. 10/30/21 11/29/21  Jill Alexanders, PA-C  polyethylene glycol (MIRALAX / GLYCOLAX) 17 g packet Take 17 g by mouth daily as needed for mild constipation. Patient not taking: Reported on 11/05/2021 10/18/21   Shelly Coss, MD  promethazine (PHENERGAN) 12.5 MG tablet Take 1 tablet (12.5 mg total) by mouth every 12 (twelve) hours as needed for refractory nausea / vomiting (if failed zofran). 11/20/21   Milus Banister, MD  sucralfate (CARAFATE) 1 g tablet Take 1 tablet (1 g total) by mouth 4 (four) times daily -  with meals and at bedtime. Crush and mix with water to make slurry 10/31/21 11/30/21  Esterwood, Amy S, PA-C  Suvorexant (BELSOMRA) 20 MG TABS Take 1 tablet  by mouth at bedtime. 11/05/21   Thayer Headings, PMHNP  topiramate (TOPAMAX) 100 MG tablet Take 2 tabs twice a day Patient taking differently: Take 200 mg by mouth 2 (two) times daily. 02/15/21   Cameron Sprang, MD  traMADol (ULTRAM) 50 MG tablet Take 1 tablet (50 mg total) by mouth every 6 (six) hours as needed for severe pain. 10/18/21   Meuth, Brooke A, PA-C      Allergies    Hydrocortisone, Other, Aptiom [eslicarbazepine], Azithromycin, Bupropion, Carbamazepine, Dilantin [phenytoin sodium  extended], Diphenhydramine, Divalproex sodium, Keppra [levetiracetam], Lamictal [lamotrigine], Lorazepam, Lyrica [pregabalin], Morphine and related, Prednisone, and Vimpat [lacosamide]    Review of Systems   Review of Systems  Constitutional:  Negative for fever.  Gastrointestinal:  Positive for vomiting.   Physical Exam Updated Vital Signs BP (!) 158/84 (BP Location: Right Arm)   Pulse 92   Temp 98.6 F (37 C) (Oral)   Resp (!) 28   SpO2 100%  Physical Exam Vitals and nursing note reviewed.  Constitutional:      Appearance: She is well-developed. She is ill-appearing.  HENT:     Head: Normocephalic and atraumatic.     Right Ear: External ear normal.     Left Ear: External ear normal.  Eyes:     General: No scleral icterus.       Right eye: No discharge.        Left eye: No discharge.     Conjunctiva/sclera: Conjunctivae normal.  Neck:     Trachea: No tracheal deviation.  Cardiovascular:     Rate and Rhythm: Normal rate and regular rhythm.  Pulmonary:     Effort: Pulmonary effort is normal. No respiratory distress.     Breath sounds: Normal breath sounds. No stridor. No wheezing or rales.  Abdominal:     General: Bowel sounds are normal. There is no distension.     Palpations: Abdomen is soft.     Tenderness: There is generalized abdominal tenderness. There is no guarding or rebound.  Musculoskeletal:        General: No tenderness or deformity.     Cervical back: Neck supple.  Skin:    General: Skin is warm and dry.     Findings: No rash.  Neurological:     General: No focal deficit present.     Mental Status: She is alert.     Cranial Nerves: No cranial nerve deficit (no facial droop, extraocular movements intact, no slurred speech).     Sensory: No sensory deficit.     Motor: No abnormal muscle tone or seizure activity.     Coordination: Coordination normal.  Psychiatric:        Mood and Affect: Mood normal.    ED Results / Procedures / Treatments    Labs (all labs ordered are listed, but only abnormal results are displayed) Labs Reviewed  COMPREHENSIVE METABOLIC PANEL - Abnormal; Notable for the following components:      Result Value   Sodium 149 (*)    Chloride 115 (*)    CO2 20 (*)    Glucose, Bld 129 (*)    All other components within normal limits  LIPASE, BLOOD  CBC  MAGNESIUM  URINALYSIS, ROUTINE W REFLEX MICROSCOPIC    EKG None  Radiology No results found.  Procedures Procedures    Medications Ordered in ED Medications  lactated ringers bolus 2,000 mL (2,000 mLs Intravenous New Bag/Given 11/21/21 1426)  ondansetron (ZOFRAN) injection 4 mg (4 mg Intravenous  Given 11/21/21 1427)  iohexol (OMNIPAQUE) 300 MG/ML solution 100 mL (100 mLs Intravenous Contrast Given 11/21/21 1548)    ED Course/ Medical Decision Making/ A&P Clinical Course as of 11/21/21 1552  Thu Nov 21, 2021  1411 CBC Normal [JK]  1503 Comprehensive metabolic panel(!) Bicarb slightly decreased, sodium level slightly increased, no hypokalemia [JK]  1504 CBC Normal [JK]  1504 Magnesium Normal [JK]  1504 Lipase, blood Normal [JK]    Clinical Course User Index [JK] Dorie Rank, MD                           Medical Decision Making DDx includes but not limited to, obstruction, dehydration, gastroenteritis, pancreatitis.  Plan on labs, imaging  Problems Addressed: Abdominal pain, unspecified abdominal location: undiagnosed new problem with uncertain prognosis Hypernatremia: acute illness or injury Nausea vomiting and diarrhea: acute illness or injury  Amount and/or Complexity of Data Reviewed Labs: ordered. Decision-making details documented in ED Course. Radiology: ordered.  Risk Prescription drug management.   Pt presents with nvd abd pain.   Recent gallbladder surgery and ulcers.  Patient treated with IV fluids and antiemetics and pain medications.  CT scan ordered for further evaluation.  Care turned over to oncoming team at shift  change.        Final Clinical Impression(s) / ED Diagnoses Final diagnoses:  Nausea vomiting and diarrhea  Abdominal pain, unspecified abdominal location  Hypernatremia    Rx / DC Orders ED Discharge Orders     None         Dorie Rank, MD 11/21/21 1553

## 2021-11-21 NOTE — ED Provider Notes (Signed)
Signed out by Dr Tomi Bamberger to d/c to home when ct resulted.  Ct without bowel obstruction or other significant acute process. Note made of possible esophagitis and constipation.   Abd soft, nt. No recurrent emesis.   Po fluids/food.   Return precautions provided.      Lajean Saver, MD 11/21/21 [phone]

## 2021-11-26 ENCOUNTER — Telehealth: Payer: Self-pay | Admitting: Pharmacy Technician

## 2021-11-26 ENCOUNTER — Encounter: Payer: Self-pay | Admitting: Gastroenterology

## 2021-11-26 ENCOUNTER — Other Ambulatory Visit (HOSPITAL_COMMUNITY): Payer: Self-pay

## 2021-11-26 ENCOUNTER — Ambulatory Visit (INDEPENDENT_AMBULATORY_CARE_PROVIDER_SITE_OTHER): Payer: Medicare HMO | Admitting: Gastroenterology

## 2021-11-26 ENCOUNTER — Other Ambulatory Visit: Payer: Medicare HMO

## 2021-11-26 VITALS — BP 116/70 | HR 94 | Ht 65.0 in | Wt 111.8 lb

## 2021-11-26 DIAGNOSIS — R1084 Generalized abdominal pain: Secondary | ICD-10-CM

## 2021-11-26 DIAGNOSIS — R634 Abnormal weight loss: Secondary | ICD-10-CM

## 2021-11-26 DIAGNOSIS — K259 Gastric ulcer, unspecified as acute or chronic, without hemorrhage or perforation: Secondary | ICD-10-CM

## 2021-11-26 DIAGNOSIS — R112 Nausea with vomiting, unspecified: Secondary | ICD-10-CM

## 2021-11-26 DIAGNOSIS — R197 Diarrhea, unspecified: Secondary | ICD-10-CM

## 2021-11-26 DIAGNOSIS — Z9049 Acquired absence of other specified parts of digestive tract: Secondary | ICD-10-CM

## 2021-11-26 MED ORDER — PROMETHAZINE HCL 12.5 MG PO TABS: 12.5000 mg | ORAL_TABLET | Freq: Two times a day (BID) | ORAL | 1 refills | Status: DC | PRN

## 2021-11-26 MED ORDER — CHOLESTYRAMINE 4 G PO PACK: 4.0000 g | PACK | Freq: Two times a day (BID) | ORAL | 3 refills | Status: DC

## 2021-11-26 NOTE — Progress Notes (Signed)
HPI : Kelli Navarro is a very pleasant 71 year old female with a history of anxiety, depression and chronic back pain who presents to our clinic for follow-up after 2 recent admissions for abdominal pain.  The patient had presented to the emergency department initially on April 12 with several days of left-sided abdominal pain worsened with eating.  She started having vomiting in the emergency department.  In the emergency department, her liver enzymes were normal and she was noted to have gallstones with no evidence of cholecystitis.  Her common bile duct was noted to be slightly dilated at 8 mm.  An MRCP again showed a common bile duct of 8.5 mm and a suspected 4 mm filling defect in the distal common bile duct.  She underwent a cholecystectomy with intraoperative cholangiogram.  The IOC was negative.  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 She was discharged from the hospital April 14. Following her surgery, the patient reports she initially felt well, but then developed recurrent abdominal pain a few days later.  The pain was severe and associated with nausea and vomiting.  She went back to the emergency department April 20 and a CT scan was unremarkable, with no evidence of leak, biloma or abscess.  A HIDA scan was not performed.  An upper endoscopy was performed which showed multiple small gastric ulcers.  She was started on Protonix and Carafate and treated with antiemetics.  Her liver enzymes were consistently normal.  She followed up with general surgery on May 4, and was feeling better.  She had been having some abdominal pain and nausea but no vomiting and was having some loose stools at that visit.   On a liquid diet mostly, oatmeal, soup  CCY 6 weeks ago  Nausea all the time  Can't swallow her own saliva  Been doing okay since  her last ED visit  The nausea does not ever go away Takes Zofran ODT as needed  Takes Phenergan about 3 days  Taking Protonix and carafate  Scheduled for EGD  Losing weight:   Having a bowel movement 1-2 times/day Stool is poorly formed since her CCY      Past Medical History:  Diagnosis Date   ADHD    Anxiety    Chronic back pain    from broken back   Depression    Right femoral fracture ([place])    [facility] spotted fever    may show low Hgb   Seizures (HCC)    has during sleep   Vitamin B 12 deficiency    EGD October 26, 2021 (Dr. Tarri Glenn) 5 small cratered gastric ulcers without bleeding, subtle ringed appearance of the distal esophagus, recommended repeat EGD in 8 to 12 weeks  Past Surgical History:  Procedure Laterality Date   BACK SURGERY     BIOPSY  10/26/2021   Procedure: BIOPSY;  Surgeon: Thornton Park, MD;  Location: [facility];  Service: Gastroenterology;;   CHOLECYSTECTOMY N/A 10/17/2021   Procedure: LAPAROSCOPIC CHOLECYSTECTOMY WITH INTRAOPERATIVE CHOLANGIOGRAM;  Surgeon: Dwan Bolt, MD;  Location: [facility];  Service: General;  Laterality: N/A;   ESOPHAGOGASTRODUODENOSCOPY (EGD) WITH PROPOFOL N/A 10/26/2021   Procedure: ESOPHAGOGASTRODUODENOSCOPY (EGD) WITH PROPOFOL;  Surgeon: Thornton Park, MD;  Location: [facility];  Service: Gastroenterology;  Laterality: N/A;   none     radial frequency ablasion  05/2019   back   TOTAL HIP ARTHROPLASTY  Left 03/22/2017   Procedure: TOTAL HIP ARTHROPLASTY ANTERIOR APPROACH;  Surgeon: Rod Can, MD;  Location: [facility];  Service: Orthopedics;  Laterality: Left;   TOTAL HIP ARTHROPLASTY Right 08/16/2020   Procedure: TOTAL HIP ARTHROPLASTY ANTERIOR APPROACH;  Surgeon: Rod Can, MD;  Location: [facility];  Service: Orthopedics;  Laterality: Right;   Family History  Problem Relation Age of Onset   Multiple sclerosis Mother    Heart Problems Mother        by-pass   Alcohol abuse Mother    Heart  Problems Sister        pacemaker   Social History   Tobacco Use   Smoking status: Former    Packs/day: 0.50    Years: 1.00    Pack years: 0.50    Types: Cigarettes   Smokeless tobacco: Never  Vaping Use   Vaping Use: Never used  Substance Use Topics   Alcohol use: No   Drug use: No   Current Outpatient Medications  Medication Sig Dispense Refill   acetaminophen (TYLENOL) 500 MG tablet Take 1,000 mg by mouth every 6 (six) hours as needed for moderate pain or headache (pain).     [START ON 01/10/2022] amphetamine-dextroamphetamine (ADDERALL) 20 MG tablet Take 1 tablet (20 mg total) by mouth 2 (two) times daily. May take 1 additional tab as needed 75 tablet 0   cloNIDine (CATAPRES) 0.1 MG tablet Take 1 tablet (0.1 mg total) by mouth 2 (two) times daily. 180 tablet 0   [employer] ASPIRIN ADULT LOW DOSE 81 MG chewable tablet Chew 81 mg by mouth daily.     ipratropium (ATROVENT) 0.03 % nasal spray Place into both nostrils.     Multiple Vitamin (MULTIVITAMIN WITH MINERALS) TABS tablet Take 1 tablet by mouth daily.     ondansetron (ZOFRAN-ODT) 4 MG disintegrating tablet Take 1 tablet (4 mg total) by mouth every 6 (six) hours as needed for nausea or vomiting. 20 tablet 0   pantoprazole (PROTONIX) 40 MG tablet Take 1 tablet (40 mg total) by mouth 2 (two) times daily. 60 tablet 0   polyethylene glycol (MIRALAX / GLYCOLAX) 17 g packet Take 17 g by mouth daily as needed for mild constipation. 14 each 0   promethazine (PHENERGAN) 12.5 MG tablet Take 1 tablet (12.5 mg total) by mouth every 12 (twelve) hours as needed for refractory nausea / vomiting (if failed zofran). 15 tablet 0   sucralfate (CARAFATE) 1 g tablet Take 1 tablet (1 g total) by mouth 4 (four) times daily -  with meals and at bedtime. Crush and mix with water to make slurry 120 tablet 0   Suvorexant (BELSOMRA) 20 MG TABS Take 1 tablet by mouth at bedtime. 30 tablet 2   topiramate (TOPAMAX) 100 MG tablet Take 2 tabs twice a day (Patient taking  differently: Take 200 mg by mouth 2 (two) times daily.) 360 tablet 3   traMADol (ULTRAM-ER) 200 MG 24 hr tablet Take 200 mg by mouth daily.     No current facility-administered medications for this visit.   Allergies  Allergen Reactions   Hydrocortisone Anaphylaxis   Other Anaphylaxis    Allergy to steroids causing anaphylaxis     Aptiom [Eslicarbazepine] Other (See Comments)    Unknown reaction   Azithromycin Other (See Comments)    Unknown reaction    Bupropion Other (See Comments)     Caused depression   Carbamazepine Other (See Comments)    Unknown reaction    Dilantin [Phenytoin Sodium Extended]  Other (See Comments)    Unknown reaction    Diphenhydramine Other (See Comments)    Feels like "spiders are crawling" on her   Divalproex Sodium Other (See Comments)    Unknown reaction    Keppra [Levetiracetam] Other (See Comments)    Unknown reaction    Lamictal [Lamotrigine] Other (See Comments)    Unknown reaction    Lorazepam Swelling and Other (See Comments)    Hallucinations   Lyrica [Pregabalin] Other (See Comments)    Pt reports feeling loopy and confused   Morphine And Related Other (See Comments)    Unknown reaction - (high doses)   Prednisone Other (See Comments)    Unknown reaction    Vimpat [Lacosamide] Other (See Comments)    Unknown reaction      Review of Systems: All systems reviewed and negative except where noted in HPI.    CT ABDOMEN PELVIS W CONTRAST  Result Date: 11/21/2021 CLINICAL DATA:  Abdominal pain, acute nonlocalized. Abdominal pain with vomiting and diarrhea for 2 days. EXAM: CT ABDOMEN AND PELVIS WITH CONTRAST TECHNIQUE: Multidetector CT imaging of the abdomen and pelvis was performed using the standard protocol following bolus administration of intravenous contrast. RADIATION DOSE REDUCTION: This exam was performed according to the departmental dose-optimization program which includes automated exposure control, adjustment of the mA  and/or kV according to patient size and/or use of iterative reconstruction technique. CONTRAST:  166m OMNIPAQUE IOHEXOL 300 MG/ML  SOLN COMPARISON:  Abdominopelvic CT 10/24/2021. Abdominal MRI 10/25/2021. FINDINGS: Lower chest: Clear lung bases. No significant pleural or pericardial effusion. Hepatobiliary: The liver is normal in density without suspicious focal abnormality. Status post cholecystectomy. The biliary dilatation seen on the most recent studies has improved. There is no significant residual intrahepatic biliary dilatation. The common bile duct measures 8 mm (previously 13 mm). Pancreas: Unremarkable. No pancreatic ductal dilatation or surrounding inflammatory changes. Spleen: Normal in size without focal abnormality. Adrenals/Urinary Tract: Both adrenal glands appear normal. The kidneys appear stable with punctate left renal calculi. No evidence of ureteral calculus or hydronephrosis.The bladder is partly obscured by artifact from the bilateral total hip arthroplasties. No bladder abnormality identified. Stomach/Bowel: No enteric contrast administered. Mild distal esophageal wall thickening. The stomach appears unremarkable for its degree of distention. No evidence of bowel distension, wall thickening or surrounding inflammation. The appendix appears normal. There is prominent stool throughout the colon. Vascular/Lymphatic: There are no enlarged abdominal or pelvic lymph nodes. No significant vascular findings. Reproductive: No evidence of adnexal mass. The low pelvis remains partly obscured by artifact from the previous hip arthroplasties. Other: No evidence of abdominal wall mass or hernia. No ascites. Musculoskeletal: No acute or significant osseous findings. Stable chronic Schmorl's node involving the superior endplate of L1. Lumbar facet arthropathy inferiorly. Previous bilateral total hip arthroplasty. IMPRESSION: 1. No acute findings or clear explanation for the patient's symptoms. 2. The  biliary dilatation seen on the previous study has improved and is within physiologic limits post cholecystectomy. 3. Mild distal esophageal wall thickening suggesting esophagitis. 4. Nonobstructing left renal calculi. 5. Prominent stool throughout the colon, suggesting constipation. Electronically Signed   By: WRichardean SaleM.D.   On: 11/21/2021 16:30    Physical Exam: BP 116/70   Pulse 94   Ht '5\' 5"'$  (1.651 m)   Wt 111 lb 12.8 oz (50.7 kg)   SpO2 98%   BMI 18.60 kg/m  Constitutional: Pleasant,well-developed, ***female in no acute distress. HEENT: Normocephalic and atraumatic. Conjunctivae are normal. No scleral icterus. Neck  supple.  Cardiovascular: Normal rate, regular rhythm.  Pulmonary/chest: Effort normal and breath sounds normal. No wheezing, rales or rhonchi. Abdominal: Soft, nondistended, nontender. Bowel sounds active throughout. There are no masses palpable. No hepatomegaly. Extremities: no edema Lymphadenopathy: No cervical adenopathy noted. Neurological: Alert and oriented to person place and time. Skin: Skin is warm and dry. No rashes noted. Psychiatric: Normal mood and affect. Behavior is normal.  CBC    Component Value Date/Time   WBC 5.5 11/21/2021 1331   RBC 4.63 11/21/2021 1331   HGB 12.8 11/21/2021 1331   HCT 42.7 11/21/2021 1331   HCT 37.7 01/05/2020 1608   PLT 280 11/21/2021 1331   MCV 92.2 11/21/2021 1331   MCH 27.6 11/21/2021 1331   MCHC 30.0 11/21/2021 1331   RDW 15.1 11/21/2021 1331   LYMPHSABS 0.7 10/24/2021 1545   MONOABS 0.3 10/24/2021 1545   EOSABS 0.0 10/24/2021 1545   BASOSABS 0.0 10/24/2021 1545    CMP     Component Value Date/Time   NA 149 (H) 11/21/2021 1331   K 3.5 11/21/2021 1331   CL 115 (H) 11/21/2021 1331   CO2 20 (L) 11/21/2021 1331   GLUCOSE 129 (H) 11/21/2021 1331   BUN 18 11/21/2021 1331   CREATININE 0.97 11/21/2021 1331   CREATININE 0.84 01/05/2020 1607   CALCIUM 9.9 11/21/2021 1331   PROT 7.8 11/21/2021 1331   ALBUMIN  4.3 11/21/2021 1331   AST 15 11/21/2021 1331   AST 19 01/05/2020 1607   ALT 9 11/21/2021 1331   ALT 14 01/05/2020 1607   ALKPHOS 95 11/21/2021 1331   BILITOT 1.0 11/21/2021 1331   BILITOT 0.3 01/05/2020 1607   GFRNONAA >60 11/21/2021 1331   GFRNONAA >60 01/05/2020 1607   GFRAA >60 04/07/2020 1527   GFRAA >60 01/05/2020 1607     ASSESSMENT AND PLAN:  Skeet Latch, NP

## 2021-11-26 NOTE — Telephone Encounter (Signed)
Patient Advocate Encounter  Received notification from [facility] that prior authorization for PROMETHAZINE 12.'5MG'$  TABS is required.   PA submitted on 5.23.23 Key [id number]  Status is pending   [facility] will continue to follow  Luciano Cutter, CPhT Patient Advocate Phone: [phone]

## 2021-11-26 NOTE — Patient Instructions (Addendum)
If you are age 71 or older, your body mass index should be between 23-30. Your Body mass index is 18.6 kg/m. If this is out of the aforementioned range listed, please consider follow up with your Primary Care Provider. ________________________________________________________  The [facility] providers would like to encourage you to use [facility] to communicate with providers for non-urgent requests or questions.  Due to long hold times on the telephone, sending your provider a message by [facility] may be a faster and more efficient way to get a response.  Please allow 48 business hours for a response.  Please remember that this is for non-urgent requests.  _______________________________________________________  Your provider has requested that you go to the basement level for lab work before leaving today. Press "B" on the elevator. The lab is located at the first door on the left as you exit the elevator.  You have been scheduled for an endoscopy. Please follow written instructions given to you at your visit today. If you use inhalers (even only as needed), please bring them with you on the day of your procedure.  Due to recent changes in healthcare laws, you may see the results of your imaging and laboratory studies on MyChart before your provider has had a chance to review them.  We understand that in some cases there may be results that are confusing or concerning to you. Not all laboratory results come back in the same time frame and the provider may be waiting for multiple results in order to interpret others.  Please give [place] 48 hours in order for your provider to thoroughly review all the results before contacting the office for clarification of your results.   We have sent the following medications to your pharmacy for you to pick up at your convenience:  START: Cholestyramine 4gram packets twice daily  CONTINUE: Phenegran 12.'5mg'$  as needed   Thank you for entrusting me with your care and  choosing [facility].  Dr Candis Schatz

## 2021-11-27 ENCOUNTER — Other Ambulatory Visit: Payer: Self-pay | Admitting: Physician Assistant

## 2021-11-27 ENCOUNTER — Encounter: Payer: Self-pay | Admitting: Gastroenterology

## 2021-11-28 ENCOUNTER — Other Ambulatory Visit (HOSPITAL_COMMUNITY): Payer: Self-pay

## 2021-11-29 ENCOUNTER — Other Ambulatory Visit: Payer: Self-pay | Admitting: Physician Assistant

## 2021-12-09 ENCOUNTER — Other Ambulatory Visit: Payer: Medicare HMO

## 2021-12-09 DIAGNOSIS — R112 Nausea with vomiting, unspecified: Secondary | ICD-10-CM

## 2021-12-16 LAB — PANCREATIC ELASTASE, FECAL: Pancreatic Elastase-1, Stool: >500 mcg/g

## 2021-12-18 LAB — CALPROTECTIN, FECAL: Calprotectin, Fecal: 36 ug/g (ref 0–120)

## 2021-12-20 NOTE — Progress Notes (Signed)
Kelli Navarro,  Your stool tests were normal.  There was no evidence of inflammation in the GI tract and no evidence of chronic pancreatitis/exocrine pancreas insufficiency(EPI). We will see you next week for your EGD. Hope you are feeling better.

## 2021-12-23 ENCOUNTER — Ambulatory Visit: Payer: Medicare HMO | Admitting: Gastroenterology

## 2021-12-24 ENCOUNTER — Encounter: Payer: Self-pay | Admitting: Gastroenterology

## 2021-12-24 ENCOUNTER — Ambulatory Visit (AMBULATORY_SURGERY_CENTER): Payer: Medicare HMO | Admitting: Gastroenterology

## 2021-12-24 VITALS — BP 121/72 | HR 74 | Temp 96.2°F | Resp 16 | Ht 65.0 in | Wt 111.0 lb

## 2021-12-24 DIAGNOSIS — K222 Esophageal obstruction: Secondary | ICD-10-CM | POA: Diagnosis present

## 2021-12-24 DIAGNOSIS — Z8719 Personal history of other diseases of the digestive system: Secondary | ICD-10-CM | POA: Diagnosis not present

## 2021-12-24 DIAGNOSIS — K259 Gastric ulcer, unspecified as acute or chronic, without hemorrhage or perforation: Secondary | ICD-10-CM

## 2021-12-24 MED ORDER — PANTOPRAZOLE SODIUM 20 MG PO TBEC: 20.0000 mg | DELAYED_RELEASE_TABLET | Freq: Every day | ORAL | 3 refills | Status: DC

## 2021-12-24 MED ORDER — SODIUM CHLORIDE 0.9 % IV SOLN: 500.0000 mL | Freq: Once | INTRAVENOUS | Status: DC

## 2021-12-24 NOTE — Progress Notes (Signed)
Pt in recovery with monitors in place, VSS. Report given to receiving RN. Bite guard was placed with pt awake to ensure comfort. No dental or soft tissue damage noted. 

## 2021-12-24 NOTE — Progress Notes (Signed)
Pt's states no medical or surgical changes since previsit or office visit. 

## 2021-12-24 NOTE — Patient Instructions (Addendum)
Resume previous diet and continue present medications - decrease pantoprazole down to once a day  Pick up prescription for Pantoprazole (Protonix) 20 mg daily from pharmacy Follow-up as needed in the clinic   [place]:   Refer to the procedure report that was given to you for any specific questions about what was found during the examination.  If the procedure report does not answer your questions, please call your gastroenterologist to clarify.  If you requested that your care partner not be given the details of your procedure findings, then the procedure report has been included in a sealed envelope for you to review at your convenience later.  YOU SHOULD EXPECT: Some feelings of bloating in the abdomen. Passage of more gas than usual.  Walking can help get rid of the air that was put into your GI tract during the procedure and reduce the bloating. If you had a lower endoscopy (such as a colonoscopy or flexible sigmoidoscopy) you may notice spotting of blood in your stool or on the toilet paper. If you underwent a bowel prep for your procedure, you may not have a normal bowel movement for a few days.  Please Note:  You might notice some irritation and congestion in your nose or some drainage.  This is from the oxygen used during your procedure.  There is no need for concern and it should clear up in a day or so.  SYMPTOMS TO REPORT IMMEDIATELY:  Following upper endoscopy (EGD)  Vomiting of blood or coffee ground material  New chest pain or pain under the shoulder blades  Painful or persistently difficult swallowing  New shortness of breath  Fever of 100F or higher  Black, tarry-looking stools  For urgent or emergent issues, a gastroenterologist can be reached at any hour by calling [phone]. Do not use MyChart messaging for urgent concerns.    DIET:  We do recommend a small meal at first, but then you may proceed to your  regular diet.  Drink plenty of fluids but you should avoid alcoholic beverages for 24 hours.  ACTIVITY:  You should plan to take it easy for the rest of today and you should NOT DRIVE or use heavy machinery until tomorrow (because of the sedation medicines used during the test).    FOLLOW UP: Our staff will call the number listed on your records 24-72 hours following your procedure to check on you and address any questions or concerns that you may have regarding the information given to you following your procedure. If we do not reach you, we will leave a message.  We will attempt to reach you two times.  During this call, we will ask if you have developed any symptoms of COVID 19. If you develop any symptoms (ie: fever, flu-like symptoms, shortness of breath, cough etc.) before then, please call [phone].  If you test positive for Covid 19 in the 2 weeks post procedure, please call and report this information to [place].    If any biopsies were taken you will be contacted by phone or by letter within the next 1-3 weeks.  Please call us at [phone] if you have not heard about the biopsies in 3 weeks.    SIGNATURES/CONFIDENTIALITY: You and/or your care partner have signed paperwork which will be entered into your electronic medical record.  These signatures attest to the fact that that the information above on your After Visit Summary has been  reviewed and is understood.  Full responsibility of the confidentiality of this discharge information lies with you and/or your care-partner.

## 2021-12-24 NOTE — Op Note (Signed)
[place] Patient Name: Kelli Navarro Procedure Date: 12/24/2021 10:27 AM MRN: [id number] Endoscopist: Addison. Candis Schatz , MD Age: 71 Referring MD:  Date of Birth: [birth year] Gender: Female Account #: [id number] Procedure:                Upper GI endoscopy Indications:              Follow-up of acute gastric ulcer Medicines:                Monitored Anesthesia Care Procedure:                Pre-Anesthesia Assessment:                           - Prior to the procedure, a History and Physical                            was performed, and patient medications and                            allergies were reviewed. The patient's tolerance of                            previous anesthesia was also reviewed. The risks                            and benefits of the procedure and the sedation                            options and risks were discussed with the patient.                            All questions were answered, and informed consent                            was obtained. Prior Anticoagulants: The patient has                            taken no previous anticoagulant or antiplatelet                            agents. ASA Grade Assessment: II - A patient with                            mild systemic disease. After reviewing the risks                            and benefits, the patient was deemed in                            satisfactory condition to undergo the procedure.                           After obtaining informed consent, the endoscope was  passed under direct vision. Throughout the                            procedure, the patient's blood pressure, pulse, and                            oxygen saturations were monitored continuously. The                            GIF [id number] #[id number] was introduced through the                            mouth, and advanced to the third part of duodenum.                            The upper GI  endoscopy was accomplished without                            difficulty. The patient tolerated the procedure                            well. Scope In: Scope Out: Findings:                 The examined portions of the nasopharynx,                            oropharynx and larynx were normal.                           One benign-appearing, intrinsic mild stenosis was                            found at the gastroesophageal junction. This                            stenosis measured 1.1 cm (inner diameter) x less                            than one cm (in length). The stenosis was traversed.                           The exam of the esophagus was otherwise normal.                           The entire examined stomach was normal.                           The examined duodenum was normal. Complications:            No immediate complications. Estimated Blood Loss:     Estimated blood loss: none. Impression:               - The examined portions of the nasopharynx,  oropharynx and larynx were normal.                           - Benign-appearing esophageal stenosis.                           - Normal stomach.                           - Normal examined duodenum.                           - No specimens collected.                           - The previously noted gastric ulcers have healed. Recommendation:           - Patient has a contact number available for                            emergencies. The signs and symptoms of potential                            delayed complications were discussed with the                            patient. Return to normal activities tomorrow.                            Written discharge instructions were provided to the                            patient.                           - Resume previous diet.                           - Continue present medications.                           - Decrease Protonix to once a day. Recommend                             indefinite PPI therapy given history of ulcers and                            presence of peptic stricture.                           - Follow up as needed in clinic. Brena Windsor E. Candis Schatz, MD 12/24/2021 10:50:10 AM This report has been signed electronically.

## 2021-12-24 NOTE — Progress Notes (Signed)
History and Physical Interval Note:  12/24/2021 10:30 AM  Kelli Navarro  has presented today for endoscopic procedure(s), with the diagnosis of  Encounter Diagnosis  Name Primary?   Gastric ulcer without hemorrhage or perforation, unspecified chronicity Yes  .  The various methods of evaluation and treatment have been discussed with the patient and/or family. After consideration of risks, benefits and other options for treatment, the patient has consented to  the endoscopic procedure(s).   The patient's history has been reviewed, patient examined, no change in status, stable for endoscopic procedure(s).  I have reviewed the patient's chart and labs.  Questions were answered to the patient's satisfaction.     Espn Zeman E. Candis Schatz, MD [facility]

## 2021-12-25 ENCOUNTER — Telehealth: Payer: Self-pay | Admitting: *Deleted

## 2021-12-25 NOTE — Telephone Encounter (Signed)
Left message on f/u call 

## 2021-12-30 ENCOUNTER — Ambulatory Visit: Payer: Medicare HMO | Admitting: Gastroenterology

## 2022-02-11 ENCOUNTER — Ambulatory Visit: Payer: Medicare HMO | Admitting: Psychiatry

## 2022-02-13 ENCOUNTER — Ambulatory Visit (INDEPENDENT_AMBULATORY_CARE_PROVIDER_SITE_OTHER): Payer: Medicare HMO | Admitting: Psychiatry

## 2022-02-13 ENCOUNTER — Encounter: Payer: Self-pay | Admitting: Psychiatry

## 2022-02-13 DIAGNOSIS — F419 Anxiety disorder, unspecified: Secondary | ICD-10-CM | POA: Diagnosis not present

## 2022-02-13 DIAGNOSIS — F902 Attention-deficit hyperactivity disorder, combined type: Secondary | ICD-10-CM

## 2022-02-13 MED ORDER — AMPHETAMINE-DEXTROAMPHETAMINE 20 MG PO TABS: 20.0000 mg | ORAL_TABLET | Freq: Two times a day (BID) | ORAL | 0 refills | Status: DC

## 2022-02-13 MED ORDER — CLONIDINE HCL 0.1 MG PO TABS: 0.1000 mg | ORAL_TABLET | Freq: Two times a day (BID) | ORAL | 1 refills | Status: DC

## 2022-02-13 NOTE — Progress Notes (Signed)
Kelli Navarro [id number] [birth year] 71 y.o.  Subjective:   Patient ID:  Kelli Navarro is a 71 y.o. (DOB 1950-10-19) female.  Chief Complaint:  Chief Complaint  Patient presents with   Follow-up    Anxiety, depression, ADHD, insomnia    HPI Kelli Navarro presents to the office today for follow-up of depression, anxiety, and ADHD. She reports that her mood has been "pretty good" overall. She reports that she tends to have more depressive s/s during the colder months.  She has been preparing for her wife's surgery. Energy and motivation have been good. Denies any significant anxiety.   She recently had eye surgery and she said prior to that she was having trouble locating her glasses. She had a fall because of difficulty with depth perception.   Her PCP started her on Lunesta. She sleeps from 4 am until 8 am. She reports chronic insomnia. She reports that her appetite has been better. Reports appetite fluctuates. She reports difficulty with concentration. Denies SI.   Wife will be having surgery soon that will have a year long recover and will need constant care for 4-6 months. She will help care for her wife and take her to therapy daily.  Walks about an hour a day.  Adderall last filled 01/17/22.  Buspar- caused dizziness Luvox- adverse effects Doxepin Remeron Trazodone Abilify Olanzapine Seroquel Topamax Clonidine Klonopin Gabapentin Adderall  Flowsheet Row ED from 11/21/2021 in [facility] to Hosp-Admission (Discharged) from 10/24/2021 in [facility] to Hosp-Admission (Discharged) from 10/16/2021 in [facility] CATEGORY No Risk No Risk No Risk        Review of Systems:  Review of Systems  Eyes:        Had eye surgery last week   Musculoskeletal:  Negative for gait problem.  Psychiatric/Behavioral:         Please refer to HPI     Medications: I have reviewed the patient's current medications.  Current Outpatient Medications  Medication Sig Dispense Refill   acetaminophen (TYLENOL) 500 MG tablet Take 1,000 mg by mouth every 6 (six) hours as needed for moderate pain or headache (pain).     [START ON 03/14/2022] amphetamine-dextroamphetamine (ADDERALL) 20 MG tablet Take 1 tablet (20 mg total) by mouth 2 (two) times daily. May take an additional tablet as needed. 75 tablet 0   [START ON 04/11/2022] amphetamine-dextroamphetamine (ADDERALL) 20 MG tablet Take 1 tablet (20 mg total) by mouth 2 (two) times daily. May take an additional tablet as needed 75 tablet 0   [facility] ASPIRIN ADULT LOW DOSE 81 MG chewable tablet Chew 81 mg by mouth daily.     eszopiclone (LUNESTA) 2 MG TABS tablet Take by mouth.     ipratropium (ATROVENT) 0.03 % nasal spray Place into both nostrils.     Multiple Vitamin (MULTIVITAMIN WITH MINERALS) TABS tablet Take 1 tablet by mouth daily.     topiramate (TOPAMAX) 100 MG tablet Take 2 tabs twice a day (Patient taking differently: Take 100 mg by mouth daily.) 360 tablet 3   traMADol (ULTRAM-ER) 200 MG 24 hr tablet Take 200 mg by mouth daily.     amphetamine-dextroamphetamine (ADDERALL) 20 MG tablet Take 1 tablet (20 mg total) by mouth 2 (two) times daily. May take 1 additional tab as needed 75 tablet 0   cloNIDine (CATAPRES) 0.1 MG tablet Take 1 tablet (0.1 mg  total) by mouth 2 (two) times daily. 180 tablet 1   pantoprazole (PROTONIX) 20 MG tablet Take 1 tablet (20 mg total) by mouth daily. 90 tablet 3   No current facility-administered medications for this visit.    Medication Side Effects: None  Allergies:  Allergies  Allergen Reactions   Hydrocortisone Anaphylaxis   Other Anaphylaxis    Allergy to steroids causing anaphylaxis     Aptiom [Eslicarbazepine] Other (See Comments)    Unknown reaction   Azithromycin Other (See Comments)    Unknown reaction    Bupropion Other (See Comments)     Caused  depression   Carbamazepine Other (See Comments)    Unknown reaction    Dilantin [Phenytoin Sodium Extended] Other (See Comments)    Unknown reaction    Diphenhydramine Other (See Comments)    Feels like "spiders are crawling" on her   Divalproex Sodium Other (See Comments)    Unknown reaction    Keppra [Levetiracetam] Other (See Comments)    Unknown reaction    Lamictal [Lamotrigine] Other (See Comments)    Unknown reaction    Lorazepam Swelling and Other (See Comments)    Hallucinations   Lyrica [Pregabalin] Other (See Comments)    Pt reports feeling loopy and confused   Morphine And Related Other (See Comments)    Unknown reaction - (high doses)   Prednisone Other (See Comments)    Unknown reaction    Vimpat [Lacosamide] Other (See Comments)    Unknown reaction     Past Medical History:  Diagnosis Date   ADHD    Anxiety    Chronic back pain    from broken back   Depression    Right femoral fracture ([place])    [facility] spotted fever    may show low Hgb   Seizures (HCC)    has during sleep   Vitamin B 12 deficiency     Past Medical History, Surgical history, Social history, and Family history were reviewed and updated as appropriate.   Please see review of systems for further details on the patient's review from today.   Objective:   Physical Exam:  BP 124/74   Pulse 90   Physical Exam Constitutional:      General: She is not in acute distress. Musculoskeletal:        General: No deformity.  Neurological:     Mental Status: She is alert and oriented to person, place, and time.     Coordination: Coordination normal.  Psychiatric:        Attention and Perception: Attention and perception normal. She does not perceive auditory or visual hallucinations.        Mood and Affect: Mood normal. Mood is not anxious or depressed. Affect is not labile, blunt, angry or inappropriate.        Speech: Speech normal.        Behavior: Behavior normal.         Thought Content: Thought content normal. Thought content is not paranoid or delusional. Thought content does not include homicidal or suicidal ideation. Thought content does not include homicidal or suicidal plan.        Cognition and Memory: Cognition and memory normal.        Judgment: Judgment normal.     Comments: Insight intact     Lab Review:     Component Value Date/Time   NA 149 (H) 11/21/2021 1331   K 3.5 11/21/2021 1331   CL 115 (H) 11/21/2021  1331   CO2 20 (L) 11/21/2021 1331   GLUCOSE 129 (H) 11/21/2021 1331   BUN 18 11/21/2021 1331   CREATININE 0.97 11/21/2021 1331   CREATININE 0.84 01/05/2020 1607   CALCIUM 9.9 11/21/2021 1331   PROT 7.8 11/21/2021 1331   ALBUMIN 4.3 11/21/2021 1331   AST 15 11/21/2021 1331   AST 19 01/05/2020 1607   ALT 9 11/21/2021 1331   ALT 14 01/05/2020 1607   ALKPHOS 95 11/21/2021 1331   BILITOT 1.0 11/21/2021 1331   BILITOT 0.3 01/05/2020 1607   GFRNONAA >60 11/21/2021 1331   GFRNONAA >60 01/05/2020 1607   GFRAA >60 04/07/2020 1527   GFRAA >60 01/05/2020 1607       Component Value Date/Time   WBC 5.5 11/21/2021 1331   RBC 4.63 11/21/2021 1331   HGB 12.8 11/21/2021 1331   HCT 42.7 11/21/2021 1331   HCT 37.7 01/05/2020 1608   PLT 280 11/21/2021 1331   MCV 92.2 11/21/2021 1331   MCH 27.6 11/21/2021 1331   MCHC 30.0 11/21/2021 1331   RDW 15.1 11/21/2021 1331   LYMPHSABS 0.7 10/24/2021 1545   MONOABS 0.3 10/24/2021 1545   EOSABS 0.0 10/24/2021 1545   BASOSABS 0.0 10/24/2021 1545    No results found for: "POCLITH", "LITHIUM"   No results found for: "PHENYTOIN", "PHENOBARB", "VALPROATE", "CBMZ"   .res Assessment: Plan:   Will continue current plan of care since target signs and symptoms are well controlled without any tolerability issues. Will continue Adderall 20 mg BID and an additional 20 mg tab as needed for ADHD.  Continue Clonidine 0.1 mg po BID for anxiety.  She is currently prescribed Lunesta 2 mg po QHS by medical  provider for insomnia.  Pt to follow-up in 3 months or sooner if clinically indicated.  Patient advised to contact office with any questions, adverse effects, or acute worsening in signs and symptoms.   Kamilia was seen today for follow-up.  Diagnoses and all orders for this visit:  Attention deficit hyperactivity disorder (ADHD), combined type -     amphetamine-dextroamphetamine (ADDERALL) 20 MG tablet; Take 1 tablet (20 mg total) by mouth 2 (two) times daily. May take 1 additional tab as needed -     amphetamine-dextroamphetamine (ADDERALL) 20 MG tablet; Take 1 tablet (20 mg total) by mouth 2 (two) times daily. May take an additional tablet as needed. -     amphetamine-dextroamphetamine (ADDERALL) 20 MG tablet; Take 1 tablet (20 mg total) by mouth 2 (two) times daily. May take an additional tablet as needed  Anxiety -     cloNIDine (CATAPRES) 0.1 MG tablet; Take 1 tablet (0.1 mg total) by mouth 2 (two) times daily.     Please see After Visit Summary for patient specific instructions.  Future Appointments  Date Time Provider [place]  05/15/2022  2:30 PM Thayer Headings, PMHNP CP-CP None    No orders of the defined types were placed in this encounter.   -------------------------------

## 2022-02-20 ENCOUNTER — Ambulatory Visit: Payer: Medicare HMO | Admitting: Psychiatry

## 2022-02-20 NOTE — Telephone Encounter (Signed)
Patient Advocate Encounter  Prior Authorization for PROMETHAZINE has been approved.    PA# [id number]  Effective dates: 1.1.23 through 12.31.23  Ferrell Claiborne B. CPhT P: [phone] F: [phone]

## 2022-05-15 ENCOUNTER — Encounter: Payer: Self-pay | Admitting: Psychiatry

## 2022-05-15 ENCOUNTER — Ambulatory Visit (INDEPENDENT_AMBULATORY_CARE_PROVIDER_SITE_OTHER): Payer: Medicare HMO | Admitting: Psychiatry

## 2022-05-15 VITALS — BP 136/74 | HR 98

## 2022-05-15 DIAGNOSIS — F419 Anxiety disorder, unspecified: Secondary | ICD-10-CM | POA: Diagnosis not present

## 2022-05-15 DIAGNOSIS — F902 Attention-deficit hyperactivity disorder, combined type: Secondary | ICD-10-CM | POA: Diagnosis not present

## 2022-05-15 MED ORDER — AMPHETAMINE-DEXTROAMPHETAMINE 20 MG PO TABS: 20.0000 mg | ORAL_TABLET | Freq: Two times a day (BID) | ORAL | 0 refills | Status: DC

## 2022-05-15 MED ORDER — CLONIDINE HCL 0.1 MG PO TABS: 0.1000 mg | ORAL_TABLET | Freq: Two times a day (BID) | ORAL | 0 refills | Status: DC

## 2022-05-15 NOTE — Progress Notes (Signed)
Kelli Navarro [id number] 1951-07-01 71 y.o.  Subjective:   Patient ID:  Kelli Navarro is a 71 y.o. (DOB [birth year]) female.  Chief Complaint:  Chief Complaint  Patient presents with   Follow-up    Depression, anxiety, and ADHD    HPI Kelli Navarro presents to the office today for follow-up of ADHD, anxiety, and mood disturbance.   She reports that there is a seasonal component to her moods- "not a seasonal depression, but with motivation" when weather turns colder. Denies depressed mood. She reports that her motivation is slightly lower and notices she does not do things in advance like she usually does. She reports that she tends to eat more sugar. Appetite has been fair to poor and will supplement with protein shakes and peanut butter. Denies any significant anxiety. She reports that she has not been sleeping much and having difficulty falling asleep. Does not sleep as well if she has not been as active. Finding some activities she enjoys, such as photography. Denies SI.   Wife's surgery went very well and she recovered quicker than expected.   She is having second eye surgery for a cataracts in December. She has been limited with her reading due to eye/vision. She has been doing some computer games.   Adderall last filled 04/17/22.  Buspar- caused dizziness Luvox- adverse effects Doxepin Remeron Trazodone Abilify Olanzapine Seroquel Topamax Clonidine Klonopin Gabapentin Adderall    Flowsheet Row ED from 11/21/2021 in [place] ED to Hosp-Admission (Discharged) from 10/24/2021 in [place] [facility] to Hosp-Admission (Discharged) from 10/16/2021 in [place] CATEGORY No Risk No Risk No Risk        Review of Systems:  Review of Systems  Eyes:        Diminished vision. Eye surgery scheduled next month.  Musculoskeletal:  Negative for  gait problem.  Neurological:  Negative for tremors.  Psychiatric/Behavioral:         Please refer to HPI    Medications: I have reviewed the patient's current medications.  Current Outpatient Medications  Medication Sig Dispense Refill   [facility] ASPIRIN ADULT LOW DOSE 81 MG chewable tablet Chew 81 mg by mouth daily.     eszopiclone (LUNESTA) 2 MG TABS tablet Take by mouth.     ipratropium (ATROVENT) 0.03 % nasal spray Place into both nostrils.     Multiple Vitamin (MULTIVITAMIN WITH MINERALS) TABS tablet Take 1 tablet by mouth daily.     pantoprazole (PROTONIX) 20 MG tablet Take 1 tablet (20 mg total) by mouth daily. 90 tablet 3   traMADol (ULTRAM-ER) 200 MG 24 hr tablet Take 200 mg by mouth daily.     acetaminophen (TYLENOL) 500 MG tablet Take 1,000 mg by mouth every 6 (six) hours as needed for moderate pain or headache (pain). (Patient not taking: Reported on 05/15/2022)     amphetamine-dextroamphetamine (ADDERALL) 20 MG tablet Take 1 tablet (20 mg total) by mouth 2 (two) times daily. May take an additional tablet as needed. 75 tablet 0   [START ON 06/12/2022] amphetamine-dextroamphetamine (ADDERALL) 20 MG tablet Take 1 tablet (20 mg total) by mouth 2 (two) times daily. May take an additional tablet as needed 75 tablet 0   [START ON 07/10/2022] amphetamine-dextroamphetamine (ADDERALL) 20 MG tablet Take 1 tablet (20 mg total) by mouth 2 (two) times daily. May take 1 additional tab as needed 75 tablet 0  cloNIDine (CATAPRES) 0.1 MG tablet Take 1 tablet (0.1 mg total) by mouth 2 (two) times daily. 180 tablet 0   topiramate (TOPAMAX) 100 MG tablet Take 2 tabs twice a day (Patient taking differently: Take 100 mg by mouth every other day.) 360 tablet 3   No current facility-administered medications for this visit.    Medication Side Effects: None  Allergies:  Allergies  Allergen Reactions   Hydrocortisone Anaphylaxis   Other Anaphylaxis    Allergy to steroids causing anaphylaxis     Aptiom  [Eslicarbazepine] Other (See Comments)    Unknown reaction   Azithromycin Other (See Comments)    Unknown reaction    Bupropion Other (See Comments)     Caused depression   Carbamazepine Other (See Comments)    Unknown reaction    Dilantin [Phenytoin Sodium Extended] Other (See Comments)    Unknown reaction    Diphenhydramine Other (See Comments)    Feels like "spiders are crawling" on her   Divalproex Sodium Other (See Comments)    Unknown reaction    Keppra [Levetiracetam] Other (See Comments)    Unknown reaction    Lamictal [Lamotrigine] Other (See Comments)    Unknown reaction    Lorazepam Swelling and Other (See Comments)    Hallucinations   Lyrica [Pregabalin] Other (See Comments)    Pt reports feeling loopy and confused   Morphine And Related Other (See Comments)    Unknown reaction - (high doses)   Prednisone Other (See Comments)    Unknown reaction    Vimpat [Lacosamide] Other (See Comments)    Unknown reaction     Past Medical History:  Diagnosis Date   ADHD    Anxiety    Chronic back pain    from broken back   Depression    Right femoral fracture ([place])    [facility] spotted fever    may show low Hgb   Seizures (HCC)    has during sleep   Vitamin B 12 deficiency     Past Medical History, Surgical history, Social history, and Family history were reviewed and updated as appropriate.   Please see review of systems for further details on the patient's review from today.   Objective:   Physical Exam:  BP 136/74   Pulse 98   Physical Exam Constitutional:      General: She is not in acute distress. Musculoskeletal:        General: No deformity.  Neurological:     Mental Status: She is alert and oriented to person, place, and time.     Coordination: Coordination normal.  Psychiatric:        Attention and Perception: Attention and perception normal. She does not perceive auditory or visual hallucinations.        Mood and Affect: Mood  normal. Mood is not anxious or depressed. Affect is not labile, blunt, angry or inappropriate.        Speech: Speech normal.        Behavior: Behavior normal.        Thought Content: Thought content normal. Thought content is not paranoid or delusional. Thought content does not include homicidal or suicidal ideation. Thought content does not include homicidal or suicidal plan.        Cognition and Memory: Cognition and memory normal.        Judgment: Judgment normal.     Comments: Insight intact     Lab Review:     Component Value Date/Time  NA 149 (H) 11/21/2021 1331   K 3.5 11/21/2021 1331   CL 115 (H) 11/21/2021 1331   CO2 20 (L) 11/21/2021 1331   GLUCOSE 129 (H) 11/21/2021 1331   BUN 18 11/21/2021 1331   CREATININE 0.97 11/21/2021 1331   CREATININE 0.84 01/05/2020 1607   CALCIUM 9.9 11/21/2021 1331   PROT 7.8 11/21/2021 1331   ALBUMIN 4.3 11/21/2021 1331   AST 15 11/21/2021 1331   AST 19 01/05/2020 1607   ALT 9 11/21/2021 1331   ALT 14 01/05/2020 1607   ALKPHOS 95 11/21/2021 1331   BILITOT 1.0 11/21/2021 1331   BILITOT 0.3 01/05/2020 1607   GFRNONAA >60 11/21/2021 1331   GFRNONAA >60 01/05/2020 1607   GFRAA >60 04/07/2020 1527   GFRAA >60 01/05/2020 1607       Component Value Date/Time   WBC 5.5 11/21/2021 1331   RBC 4.63 11/21/2021 1331   HGB 12.8 11/21/2021 1331   HCT 42.7 11/21/2021 1331   HCT 37.7 01/05/2020 1608   PLT 280 11/21/2021 1331   MCV 92.2 11/21/2021 1331   MCH 27.6 11/21/2021 1331   MCHC 30.0 11/21/2021 1331   RDW 15.1 11/21/2021 1331   LYMPHSABS 0.7 10/24/2021 1545   MONOABS 0.3 10/24/2021 1545   EOSABS 0.0 10/24/2021 1545   BASOSABS 0.0 10/24/2021 1545    No results found for: "POCLITH", "LITHIUM"   No results found for: "PHENYTOIN", "PHENOBARB", "VALPROATE", "CBMZ"   .res Assessment: Plan:   Will continue current plan of care since target signs and symptoms are well controlled without any tolerability issues. Continue Adderall 20 mg  po BID and an additional tab as needed.  Continue Clonidine 0.1 mg po BID for anxiety and ADHD.  Pt to follow-up in 3 months or sooner if clinically indicated.  Patient advised to contact office with any questions, adverse effects, or acute worsening in signs and symptoms.  Yoko was seen today for follow-up.  Diagnoses and all orders for this visit:  Attention deficit hyperactivity disorder (ADHD), combined type -     amphetamine-dextroamphetamine (ADDERALL) 20 MG tablet; Take 1 tablet (20 mg total) by mouth 2 (two) times daily. May take an additional tablet as needed. -     amphetamine-dextroamphetamine (ADDERALL) 20 MG tablet; Take 1 tablet (20 mg total) by mouth 2 (two) times daily. May take an additional tablet as needed -     amphetamine-dextroamphetamine (ADDERALL) 20 MG tablet; Take 1 tablet (20 mg total) by mouth 2 (two) times daily. May take 1 additional tab as needed  Anxiety -     cloNIDine (CATAPRES) 0.1 MG tablet; Take 1 tablet (0.1 mg total) by mouth 2 (two) times daily.     Please see After Visit Summary for patient specific instructions.  No future appointments.  No orders of the defined types were placed in this encounter.   -------------------------------

## 2022-08-14 ENCOUNTER — Ambulatory Visit (INDEPENDENT_AMBULATORY_CARE_PROVIDER_SITE_OTHER): Payer: Medicare HMO | Admitting: Psychiatry

## 2022-08-14 ENCOUNTER — Encounter: Payer: Self-pay | Admitting: Psychiatry

## 2022-08-14 ENCOUNTER — Telehealth: Payer: Self-pay

## 2022-08-14 DIAGNOSIS — F419 Anxiety disorder, unspecified: Secondary | ICD-10-CM | POA: Diagnosis not present

## 2022-08-14 DIAGNOSIS — F902 Attention-deficit hyperactivity disorder, combined type: Secondary | ICD-10-CM

## 2022-08-14 MED ORDER — CLONIDINE HCL 0.1 MG PO TABS: 0.1000 mg | ORAL_TABLET | Freq: Two times a day (BID) | ORAL | 0 refills | Status: DC

## 2022-08-14 MED ORDER — AMPHETAMINE-DEXTROAMPHETAMINE 20 MG PO TABS: 20.0000 mg | ORAL_TABLET | Freq: Three times a day (TID) | ORAL | 0 refills | Status: DC

## 2022-08-14 NOTE — Telephone Encounter (Addendum)
Prior Authorization Amphetamine-Dextroamphetamine 20MG tablets #90/30d [employer]  Approved Effective:  07/07/22-07/07/2023

## 2022-08-14 NOTE — Progress Notes (Unsigned)
[name] RO:[id number] May 17, 1951 72 y.o.  Subjective:   Patient ID:  Kelli Navarro is a 71 y.o. (DOB [birth year]) female.  Chief Complaint:  Chief Complaint  Patient presents with   ADHD    HPI [name] presents to the office today for follow-up of ADHD, anxiety, and mood disturbance. She reports worsening ADHD symptoms. She reports that her mind is wandering more. She reports that she has been having difficulty completing tasks and getting distracted. Reports that she has been losing track of time and then having to rush. She has missed appointments or come to appointments on the wrong day. She reports that in the past she managed ADHD s/s with exercise and is no longer physically able to run 12-14 miles a day- "I can't get the same relief with hiking." She now has changed to a plant-based diet to help with chronic GI issues. She reports that it is taking her awhile to adjust to these changes and learning how to prepare certain recipes.   She reports anxiety in response to losing track of time. Denies any other anxiety. She reports that she has had "seasonal depression, not real depression." She reports some constant low-level depression. She reports that she has chronic difficulty with sleep. Estimates sleeping 4-6 hours a night. She reports that her motivation and energy are lower every winter. She enjoys reading. Her other interests and activities are outside. Denies SI- "I can't imagine doing that" because of her family.   Adderall last filled 07/15/22.    Buspar- caused dizziness Luvox- adverse effects Doxepin Remeron Trazodone Abilify Olanzapine Seroquel Topamax Clonidine Klonopin Gabapentin Adderall    Flowsheet Row ED from 11/21/2021 in [facility] at [facility] ED to Hosp-Admission (Discharged) from 10/24/2021 in [place] [facility] to Hosp-Admission (Discharged) from 10/16/2021 in [place] CATEGORY No Risk No Risk No Risk        Review of Systems:  Review of Systems  Cardiovascular:  Negative for palpitations.  Musculoskeletal:  Negative for gait problem.  Neurological:  Negative for seizures.  Psychiatric/Behavioral:         Please refer to HPI    Medications: I have reviewed the patient's current medications.  Current Outpatient Medications  Medication Sig Dispense Refill   [employer] ASPIRIN ADULT LOW DOSE 81 MG chewable tablet Chew 81 mg by mouth daily.     eszopiclone (LUNESTA) 2 MG TABS tablet Take by mouth.     ipratropium (ATROVENT) 0.03 % nasal spray Place into both nostrils.     Multiple Vitamin (MULTIVITAMIN WITH MINERALS) TABS tablet Take 1 tablet by mouth daily.     pantoprazole (PROTONIX) 20 MG tablet Take 1 tablet (20 mg total) by mouth daily. 90 tablet 3   traMADol (ULTRAM-ER) 200 MG 24 hr tablet Take 200 mg by mouth daily.     acetaminophen (TYLENOL) 500 MG tablet Take 1,000 mg by mouth every 6 (six) hours as needed for moderate pain or headache (pain). (Patient not taking: Reported on 05/15/2022)     amphetamine-dextroamphetamine (ADDERALL) 20 MG tablet Take 1 tablet (20 mg total) by mouth 3 (three) times daily. 90 tablet 0   [START ON 09/11/2022] amphetamine-dextroamphetamine (ADDERALL) 20 MG tablet Take 1 tablet (20 mg total) by mouth 3 (three) times daily. 90 tablet 0   [START ON 10/09/2022] amphetamine-dextroamphetamine (ADDERALL) 20 MG tablet Take 1 tablet (20 mg total)  by mouth 3 (three) times daily. 90 tablet 0   cloNIDine (CATAPRES) 0.1 MG tablet Take 1 tablet (0.1 mg total) by mouth 2 (two) times daily. 180 tablet 0   No current facility-administered medications for this visit.    Medication Side Effects: None  Allergies:  Allergies  Allergen Reactions   Hydrocortisone Anaphylaxis   Other Anaphylaxis    Allergy to steroids causing anaphylaxis     Aptiom [Eslicarbazepine] Other (See Comments)     Unknown reaction   Azithromycin Other (See Comments)    Unknown reaction    Bupropion Other (See Comments)     Caused depression   Carbamazepine Other (See Comments)    Unknown reaction    Dilantin [Phenytoin Sodium Extended] Other (See Comments)    Unknown reaction    Diphenhydramine Other (See Comments)    Feels like "spiders are crawling" on her   Divalproex Sodium Other (See Comments)    Unknown reaction    Keppra [Levetiracetam] Other (See Comments)    Unknown reaction    Lamictal [Lamotrigine] Other (See Comments)    Unknown reaction    Lorazepam Swelling and Other (See Comments)    Hallucinations   Lyrica [Pregabalin] Other (See Comments)    Pt reports feeling loopy and confused   Morphine And Related Other (See Comments)    Unknown reaction - (high doses)   Prednisone Other (See Comments)    Unknown reaction    Vimpat [Lacosamide] Other (See Comments)    Unknown reaction     Past Medical History:  Diagnosis Date   ADHD    Anxiety    Chronic back pain    from broken back   Depression    Right femoral fracture ([place])    [facility] spotted fever    may show low Hgb   Seizures (HCC)    has during sleep   Vitamin B 12 deficiency     Past Medical History, Surgical history, Social history, and Family history were reviewed and updated as appropriate.   Please see review of systems for further details on the patient's review from today.   Objective:   Physical Exam:  BP 138/76   Pulse 85   Physical Exam Constitutional:      General: She is not in acute distress. Musculoskeletal:        General: No deformity.  Neurological:     Mental Status: She is alert and oriented to person, place, and time.     Coordination: Coordination normal.  Psychiatric:        Attention and Perception: Attention and perception normal. She does not perceive auditory or visual hallucinations.        Mood and Affect: Mood is not anxious. Affect is not labile, blunt,  angry or inappropriate.        Speech: Speech normal.        Behavior: Behavior normal.        Thought Content: Thought content normal. Thought content is not paranoid or delusional. Thought content does not include homicidal or suicidal ideation. Thought content does not include homicidal or suicidal plan.        Cognition and Memory: Cognition and memory normal.        Judgment: Judgment normal.     Comments: Insight intact Mood is mildly depressed     Lab Review:     Component Value Date/Time   NA 149 (H) 11/21/2021 1331   K 3.5 11/21/2021 1331   CL 115 (  H) 11/21/2021 1331   CO2 20 (L) 11/21/2021 1331   GLUCOSE 129 (H) 11/21/2021 1331   BUN 18 11/21/2021 1331   CREATININE 0.97 11/21/2021 1331   CREATININE 0.84 01/05/2020 1607   CALCIUM 9.9 11/21/2021 1331   PROT 7.8 11/21/2021 1331   ALBUMIN 4.3 11/21/2021 1331   AST 15 11/21/2021 1331   AST 19 01/05/2020 1607   ALT 9 11/21/2021 1331   ALT 14 01/05/2020 1607   ALKPHOS 95 11/21/2021 1331   BILITOT 1.0 11/21/2021 1331   BILITOT 0.3 01/05/2020 1607   GFRNONAA >60 11/21/2021 1331   GFRNONAA >60 01/05/2020 1607   GFRAA >60 04/07/2020 1527   GFRAA >60 01/05/2020 1607       Component Value Date/Time   WBC 5.5 11/21/2021 1331   RBC 4.63 11/21/2021 1331   HGB 12.8 11/21/2021 1331   HCT 42.7 11/21/2021 1331   HCT 37.7 01/05/2020 1608   PLT 280 11/21/2021 1331   MCV 92.2 11/21/2021 1331   MCH 27.6 11/21/2021 1331   MCHC 30.0 11/21/2021 1331   RDW 15.1 11/21/2021 1331   LYMPHSABS 0.7 10/24/2021 1545   MONOABS 0.3 10/24/2021 1545   EOSABS 0.0 10/24/2021 1545   BASOSABS 0.0 10/24/2021 1545    No results found for: "POCLITH", "LITHIUM"   No results found for: "PHENYTOIN", "PHENOBARB", "VALPROATE", "CBMZ"   .res Assessment: Plan:    Will increase Adderall to previous dose of 20 mg TID for ADHD since she has tolerated this dose long-term without difficulty  and her BP remains well controlled. She denies any  seizure-like activity for several years and reports that in the past seizures seemed to be febrile seizures and when she had severe uncontrolled pain.  Will continue Clonidine 0.1 mg po BID for anxiety and insomnia.  Pt to follow-up in 3 months or sooner if clinically indicated.  Patient advised to contact office with any questions, adverse effects, or acute worsening in signs and symptoms.   Myrene was seen today for adhd.  Diagnoses and all orders for this visit:  Attention deficit hyperactivity disorder (ADHD), combined type -     amphetamine-dextroamphetamine (ADDERALL) 20 MG tablet; Take 1 tablet (20 mg total) by mouth 3 (three) times daily. -     amphetamine-dextroamphetamine (ADDERALL) 20 MG tablet; Take 1 tablet (20 mg total) by mouth 3 (three) times daily. -     amphetamine-dextroamphetamine (ADDERALL) 20 MG tablet; Take 1 tablet (20 mg total) by mouth 3 (three) times daily.  Anxiety -     cloNIDine (CATAPRES) 0.1 MG tablet; Take 1 tablet (0.1 mg total) by mouth 2 (two) times daily.     Please see After Visit Summary for patient specific instructions.  Future Appointments  Date Time Provider [place]  11/06/2022  2:30 PM Thayer Headings, PMHNP [facility]-CP None    No orders of the defined types were placed in this encounter.   -------------------------------

## 2022-08-15 ENCOUNTER — Telehealth: Payer: Self-pay | Admitting: Psychiatry

## 2022-08-15 NOTE — Telephone Encounter (Signed)
Pt advised Clonidine will need PA also, due to new year.

## 2022-08-17 NOTE — Telephone Encounter (Signed)
Tried submitting a prior authorization for Clonidine 0.1 mg #180/90 day but response from [employer] Part D was PA not needed. Will contact pharmacy for clarification.

## 2022-08-20 ENCOUNTER — Telehealth: Payer: Self-pay | Admitting: Psychiatry

## 2022-08-20 NOTE — Telephone Encounter (Signed)
Contacted pt to recommend follow-up with neurology since she has a history of seizures and has reported gradually weaning off Topamax. Pt reports that her neurologist was aware that she was tapering off Topamax and that she initially started decreasing Topamax after nephrologist recommended decreasing Topamax. Discussed that Adderall can potentially lower seizure threshold. Pt reports taking Adderall since childhood. She reports that she stopped Adderall during her 20's when she had 2 children and then resumed Adderall. She reports that she has taken Adderall consistently for decades. She reports that she had seizures started years ago after a tick bite and "by all accounts" the initial cause of seizures has resolved. She denies any seizure activity in over 2 years. She agrees to follow-up with neurologist to discuss if continued treatment is needed for prevention of seizures.

## 2022-10-24 ENCOUNTER — Telehealth: Payer: Self-pay | Admitting: Gastroenterology

## 2022-10-24 DIAGNOSIS — Z1211 Encounter for screening for malignant neoplasm of colon: Secondary | ICD-10-CM

## 2022-10-24 NOTE — Telephone Encounter (Signed)
Inbound call from patient wanting to have her cologuard test done. Please advise.

## 2022-10-27 NOTE — Telephone Encounter (Signed)
Pt requesting to have cologuard test done. Please advise.

## 2022-10-29 NOTE — Telephone Encounter (Signed)
Left detailed message for pt that the order is being placed and she should receive the kit in the mail for the cologuard.

## 2022-11-06 ENCOUNTER — Encounter: Payer: Self-pay | Admitting: Psychiatry

## 2022-11-06 ENCOUNTER — Ambulatory Visit (INDEPENDENT_AMBULATORY_CARE_PROVIDER_SITE_OTHER): Payer: Medicare HMO | Admitting: Psychiatry

## 2022-11-06 VITALS — BP 128/74 | HR 96

## 2022-11-06 DIAGNOSIS — F902 Attention-deficit hyperactivity disorder, combined type: Secondary | ICD-10-CM | POA: Diagnosis not present

## 2022-11-06 DIAGNOSIS — F32A Depression, unspecified: Secondary | ICD-10-CM | POA: Diagnosis not present

## 2022-11-06 DIAGNOSIS — F419 Anxiety disorder, unspecified: Secondary | ICD-10-CM

## 2022-11-06 MED ORDER — AMPHETAMINE-DEXTROAMPHETAMINE 20 MG PO TABS: 20.0000 mg | ORAL_TABLET | Freq: Three times a day (TID) | ORAL | 0 refills | Status: DC

## 2022-11-06 MED ORDER — FLUOXETINE HCL 10 MG PO CAPS: 10.0000 mg | ORAL_CAPSULE | Freq: Every day | ORAL | 1 refills | Status: DC

## 2022-11-06 MED ORDER — CLONIDINE HCL 0.1 MG PO TABS
0.1000 mg | ORAL_TABLET | Freq: Two times a day (BID) | ORAL | 0 refills | Status: DC
Start: 2022-11-06 — End: 2023-01-29
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
## 2022-11-29 ENCOUNTER — Other Ambulatory Visit: Payer: Self-pay | Admitting: Psychiatry

## 2022-11-29 DIAGNOSIS — F419 Anxiety disorder, unspecified: Secondary | ICD-10-CM

## 2022-11-29 DIAGNOSIS — F32A Depression, unspecified: Secondary | ICD-10-CM

## 2022-12-18 ENCOUNTER — Encounter: Payer: Self-pay | Admitting: Psychiatry

## 2022-12-18 ENCOUNTER — Ambulatory Visit (INDEPENDENT_AMBULATORY_CARE_PROVIDER_SITE_OTHER): Payer: Medicare HMO | Admitting: Psychiatry

## 2022-12-18 VITALS — BP 151/93 | HR 97

## 2022-12-18 DIAGNOSIS — F902 Attention-deficit hyperactivity disorder, combined type: Secondary | ICD-10-CM

## 2022-12-18 DIAGNOSIS — G43801 Other migraine, not intractable, with status migrainosus: Secondary | ICD-10-CM | POA: Diagnosis not present

## 2022-12-18 DIAGNOSIS — F419 Anxiety disorder, unspecified: Secondary | ICD-10-CM

## 2022-12-18 DIAGNOSIS — F32A Depression, unspecified: Secondary | ICD-10-CM | POA: Diagnosis not present

## 2022-12-18 MED ORDER — FLUOXETINE HCL 10 MG PO CAPS
20.0000 mg | ORAL_CAPSULE | Freq: Every day | ORAL | 0 refills | Status: DC
Start: 2022-12-18 — End: 2023-01-29

## 2022-12-18 MED ORDER — TOPIRAMATE 100 MG PO TABS
ORAL_TABLET | ORAL | 1 refills | Status: DC
Start: 2022-12-18 — End: 2023-01-11

## 2022-12-18 MED ORDER — AMPHETAMINE-DEXTROAMPHETAMINE 20 MG PO TABS: 20.0000 mg | ORAL_TABLET | Freq: Three times a day (TID) | ORAL | 0 refills | Status: DC

## 2022-12-18 NOTE — Progress Notes (Signed)
Kelli Navarro [id number] [birth year] 72 y.o.  Subjective:   Patient ID:  Kelli Navarro is a 72 y.o. (DOB 06-Mar-1951) female.  Chief Complaint:  Chief Complaint  Patient presents with   Depression   Anxiety   Headache    HPI Kelli Navarro presents to the office today for follow-up of anxiety, mood disturbance, sleep disturbance, and ADHD. She reports that she has not seen any significant changes with starting Prozac, either positive or negative. She notices some depression. She reports increased anxiety. She reports that she has anxiety about "things that don't make sense." She reports feeling nervous. She reports some worry. She reports that she had anxiety today when her dog was hiding and she had less time to prepare for appointment. She reports anxiety with leaving the house and feels her HR and BP increase before leaving home. Reports anxiety does not prevent her from leaving home. She reports increased anxiety for the past 2 years.  She reports that she is not sleeping for more than 4 hours. Sleeps from 4 am-8 am. She reports sleep is improved with Lunesta. She reports concentration has been better with taking Adderall TID. She reports energy and motivation are "there sometimes" and sometimes is not there. Appetite fluctuates. Has not been preparing food as often and eating a protein bar or having a protein drink instead. Denies SI.   She reports worsening headaches since stopping Topamax. She reports headaches are 2 days in duration.   Adderall last filled 12/12/22. Lunesta last filled 11/20/22 x 2.  Buspar- caused dizziness Luvox- adverse effects Prozac Doxepin Remeron Wellbutrin Trazodone Abilify Rexulti Olanzapine Seroquel Topamax Clonidine Klonopin Gabapentin Adderall  PHQ2-9    Flowsheet Row Office Visit from 11/06/2022 in [facility]  PHQ-2 Total Score 4  PHQ-9 Total Score 20      Flowsheet Row ED from 11/21/2021 in  [facility] Emergency Department at [facility] ED to Hosp-Admission (Discharged) from 10/24/2021 in [facility]  SURGICAL ED to Hosp-Admission (Discharged) from 10/16/2021 in [facility] 6 NORTH  SURGICAL  C-SSRS RISK CATEGORY No Risk No Risk No Risk        Review of Systems:  Review of Systems  Musculoskeletal:  Negative for gait problem.  Neurological:  Positive for headaches.  Psychiatric/Behavioral:         Please refer to HPI    Medications: I have reviewed the patient's current medications.  Current Outpatient Medications  Medication Sig Dispense Refill   eszopiclone (LUNESTA) 2 MG TABS tablet Take by mouth.     topiramate (TOPAMAX) 100 MG tablet Take 1/2 tablet daily for one week, then 1/2 tablet twice daily for one week, then 1/2 tablet in the morning and 1 tablet at bedtime for one week, then 1 tablet twice daily for one week, then 1 tablet in the morning and 1.5 tablets at bedtime for one week, then 1.5 tablets twice daily 90 tablet 1   traMADol (ULTRAM-ER) 200 MG 24 hr tablet Take 200 mg by mouth daily.     acetaminophen (TYLENOL) 500 MG tablet Take 1,000 mg by mouth every 6 (six) hours as needed for moderate pain or headache (pain). (Patient not taking: Reported on 05/15/2022)     amphetamine-dextroamphetamine (ADDERALL) 20 MG tablet Take 1 tablet (20 mg total) by mouth 3 (three) times daily. 90 tablet 0   [START ON 01/06/2023] amphetamine-dextroamphetamine (ADDERALL) 20 MG tablet Take 1 tablet (20 mg total)  by mouth 3 (three) times daily. 90 tablet 0   [START ON 02/03/2023] amphetamine-dextroamphetamine (ADDERALL) 20 MG tablet Take 1 tablet (20 mg total) by mouth 3 (three) times daily. 90 tablet 0   cloNIDine (CATAPRES) 0.1 MG tablet Take 1 tablet (0.1 mg total) by mouth 2 (two) times daily. 180 tablet 0   [facility] ASPIRIN ADULT LOW DOSE 81 MG chewable tablet Chew 81 mg by mouth daily.     FLUoxetine (PROZAC) 10 MG capsule Take 2  capsules (20 mg total) by mouth daily. 90 capsule 0   ipratropium (ATROVENT) 0.03 % nasal spray Place into both nostrils.     Multiple Vitamin (MULTIVITAMIN WITH MINERALS) TABS tablet Take 1 tablet by mouth daily.     pantoprazole (PROTONIX) 20 MG tablet Take 1 tablet (20 mg total) by mouth daily. (Patient not taking: Reported on 11/06/2022) 90 tablet 3   No current facility-administered medications for this visit.    Medication Side Effects: None  Allergies:  Allergies  Allergen Reactions   Hydrocortisone Anaphylaxis   Other Anaphylaxis    Allergy to steroids causing anaphylaxis     Aptiom [Eslicarbazepine] Other (See Comments)    Unknown reaction   Azithromycin Other (See Comments)    Unknown reaction    Bupropion Other (See Comments)     Caused depression   Carbamazepine Other (See Comments)    Unknown reaction    Dilantin [Phenytoin Sodium Extended] Other (See Comments)    Unknown reaction    Diphenhydramine Other (See Comments)    Feels like "spiders are crawling" on her   Divalproex Sodium Other (See Comments)    Unknown reaction    Keppra [Levetiracetam] Other (See Comments)    Unknown reaction    Lamictal [Lamotrigine] Other (See Comments)    Unknown reaction    Lorazepam Swelling and Other (See Comments)    Hallucinations   Lyrica [Pregabalin] Other (See Comments)    Pt reports feeling loopy and confused   Morphine And Codeine Other (See Comments)    Unknown reaction - (high doses)   Prednisone Other (See Comments)    Unknown reaction    Vimpat [Lacosamide] Other (See Comments)    Unknown reaction     Past Medical History:  Diagnosis Date   ADHD    Anxiety    Chronic back pain    from broken back   Depression    Right femoral fracture (HCC)    [facility] spotted fever    may show low Hgb   Seizures (HCC)    has during sleep   Vitamin B 12 deficiency     Past Medical History, Surgical history, Social history, and Family history were  reviewed and updated as appropriate.   Please see review of systems for further details on the patient's review from today.   Objective:   Physical Exam:  BP (!) 151/93   Pulse 97   Physical Exam Constitutional:      General: She is not in acute distress. Musculoskeletal:        General: No deformity.  Neurological:     Mental Status: She is alert and oriented to person, place, and time.     Coordination: Coordination normal.  Psychiatric:        Attention and Perception: Attention and perception normal. She does not perceive auditory or visual hallucinations.        Mood and Affect: Mood is anxious. Affect is not labile, blunt, angry or inappropriate.  Speech: Speech normal.        Behavior: Behavior normal.        Thought Content: Thought content normal. Thought content is not paranoid or delusional. Thought content does not include homicidal or suicidal ideation. Thought content does not include homicidal or suicidal plan.        Cognition and Memory: Cognition and memory normal.        Judgment: Judgment normal.     Comments: Insight intact Mood presents as less depressed compared to last exam.     Lab Review:     Component Value Date/Time   NA 149 (H) 11/21/2021 1331   K 3.5 11/21/2021 1331   CL 115 (H) 11/21/2021 1331   CO2 20 (L) 11/21/2021 1331   GLUCOSE 129 (H) 11/21/2021 1331   BUN 18 11/21/2021 1331   CREATININE 0.97 11/21/2021 1331   CREATININE 0.84 01/05/2020 1607   CALCIUM 9.9 11/21/2021 1331   PROT 7.8 11/21/2021 1331   ALBUMIN 4.3 11/21/2021 1331   AST 15 11/21/2021 1331   AST 19 01/05/2020 1607   ALT 9 11/21/2021 1331   ALT 14 01/05/2020 1607   ALKPHOS 95 11/21/2021 1331   BILITOT 1.0 11/21/2021 1331   BILITOT 0.3 01/05/2020 1607   GFRNONAA >60 11/21/2021 1331   GFRNONAA >60 01/05/2020 1607   GFRAA >60 04/07/2020 1527   GFRAA >60 01/05/2020 1607       Component Value Date/Time   WBC 5.5 11/21/2021 1331   RBC 4.63 11/21/2021 1331    HGB 12.8 11/21/2021 1331   HCT 42.7 11/21/2021 1331   HCT 37.7 01/05/2020 1608   PLT 280 11/21/2021 1331   MCV 92.2 11/21/2021 1331   MCH 27.6 11/21/2021 1331   MCHC 30.0 11/21/2021 1331   RDW 15.1 11/21/2021 1331   LYMPHSABS 0.7 10/24/2021 1545   MONOABS 0.3 10/24/2021 1545   EOSABS 0.0 10/24/2021 1545   BASOSABS 0.0 10/24/2021 1545    No results found for: "POCLITH", "LITHIUM"   No results found for: "PHENYTOIN", "PHENOBARB", "VALPROATE", "CBMZ"   .res Assessment: Plan:    Discussed option of increasing Prozac to 20 mg daily since patient has not had any with 10 mg dose.  Discussed that 10 mg dose is low and likely not therapeutic.  Patient agreed to increase in Prozac. Patient reports having more frequent headaches since stopping Topamax.  Discussed that Topamax is prescribed for headaches, as well as prevention of seizures.  Discussed that Prozac is also used off label for anxiety.  Discussed option of restarting Topamax to target these symptoms.  Patient agrees to restarting Topamax.  She reports experiencing headaches when total daily dose was decreased from 400 mg to 200 mg.  Discussed restarting Topamax 50 mg daily and increasing total daily dose by 50 mg each week in divided doses until target dose of 150 mg twice daily. Continue Adderall 20 mg 3 times daily for attention deficit disorder. Continue clonidine 0.1 mg twice daily for anxiety. Pt to follow-up with this provider in 6 weeks or sooner if clinically indicated.  Patient advised to contact office with any questions, adverse effects, or acute worsening in signs and symptoms.   Kelli Navarro was seen today for depression, anxiety and headache.  Diagnoses and all orders for this visit:  Anxiety -     FLUoxetine (PROZAC) 10 MG capsule; Take 2 capsules (20 mg total) by mouth daily. -     topiramate (TOPAMAX) 100 MG tablet; Take 1/2 tablet daily for one  week, then 1/2 tablet twice daily for one week, then 1/2 tablet in the  morning and 1 tablet at bedtime for one week, then 1 tablet twice daily for one week, then 1 tablet in the morning and 1.5 tablets at bedtime for one week, then 1.5 tablets twice daily  Depression, unspecified depression type -     FLUoxetine (PROZAC) 10 MG capsule; Take 2 capsules (20 mg total) by mouth daily.  Attention deficit hyperactivity disorder (ADHD), combined type -     amphetamine-dextroamphetamine (ADDERALL) 20 MG tablet; Take 1 tablet (20 mg total) by mouth 3 (three) times daily.  Other migraine with status migrainosus, not intractable -     topiramate (TOPAMAX) 100 MG tablet; Take 1/2 tablet daily for one week, then 1/2 tablet twice daily for one week, then 1/2 tablet in the morning and 1 tablet at bedtime for one week, then 1 tablet twice daily for one week, then 1 tablet in the morning and 1.5 tablets at bedtime for one week, then 1.5 tablets twice daily     Please see After Visit Summary for patient specific instructions.  Future Appointments  Date Time Provider Department Center  01/29/2023  2:00 PM Corie Chiquito, PMHNP [facility] None    No orders of the defined types were placed in this encounter.   -------------------------------

## 2023-01-02 LAB — COLOGUARD: COLOGUARD: NEGATIVE

## 2023-01-05 NOTE — Progress Notes (Signed)
Kelli Navarro,  Your Cologuard test was negative.  I recommend a repeat Cologuard in 3 years.

## 2023-01-11 ENCOUNTER — Other Ambulatory Visit: Payer: Self-pay | Admitting: Psychiatry

## 2023-01-11 DIAGNOSIS — G43801 Other migraine, not intractable, with status migrainosus: Secondary | ICD-10-CM

## 2023-01-11 DIAGNOSIS — F419 Anxiety disorder, unspecified: Secondary | ICD-10-CM

## 2023-01-17 ENCOUNTER — Other Ambulatory Visit: Payer: Self-pay | Admitting: Gastroenterology

## 2023-01-17 DIAGNOSIS — K259 Gastric ulcer, unspecified as acute or chronic, without hemorrhage or perforation: Secondary | ICD-10-CM

## 2023-01-29 ENCOUNTER — Encounter: Payer: Self-pay | Admitting: Psychiatry

## 2023-01-29 ENCOUNTER — Ambulatory Visit (INDEPENDENT_AMBULATORY_CARE_PROVIDER_SITE_OTHER): Payer: Medicare HMO | Admitting: Psychiatry

## 2023-01-29 DIAGNOSIS — F32A Depression, unspecified: Secondary | ICD-10-CM | POA: Diagnosis not present

## 2023-01-29 DIAGNOSIS — F902 Attention-deficit hyperactivity disorder, combined type: Secondary | ICD-10-CM

## 2023-01-29 DIAGNOSIS — F419 Anxiety disorder, unspecified: Secondary | ICD-10-CM

## 2023-01-29 MED ORDER — AMPHETAMINE-DEXTROAMPHETAMINE 20 MG PO TABS
20.0000 mg | ORAL_TABLET | Freq: Three times a day (TID) | ORAL | 0 refills | Status: DC
Start: 2023-03-03 — End: 2023-04-30

## 2023-01-29 MED ORDER — AMPHETAMINE-DEXTROAMPHETAMINE 20 MG PO TABS
20.0000 mg | ORAL_TABLET | Freq: Three times a day (TID) | ORAL | 0 refills | Status: DC
Start: 2023-03-31 — End: 2023-04-30

## 2023-01-29 MED ORDER — CLONIDINE HCL 0.1 MG PO TABS
0.1000 mg | ORAL_TABLET | Freq: Two times a day (BID) | ORAL | 0 refills | Status: DC
Start: 2023-01-29 — End: 2023-04-30

## 2023-01-29 MED ORDER — FLUOXETINE HCL 40 MG PO CAPS
40.0000 mg | ORAL_CAPSULE | Freq: Every day | ORAL | 1 refills | Status: DC
Start: 2023-01-29 — End: 2023-04-30

## 2023-01-29 NOTE — Progress Notes (Signed)
Kelli Navarro [id number] Mar 07, 1951 72 y.o.  Subjective:   Patient ID:  Kelli Navarro is a 72 y.o. (DOB 01-01-1951) female.  Chief Complaint:  Chief Complaint  Patient presents with   Follow-up    Depression, Anxiety, and ADHD    HPI Kelli Navarro presents to the office today for follow-up of depression, anxiety, and ADHD.  She reports that the "first 30 days, I noticed nothing at all. The next 30 days at the end I noticed a little glimmer" of some improvement. She reports that she experienced increased appetite. She reports, "now I never get full" and has been wanting to eat more than usual. She reports gaining at least 6 lbs in the last month.   She denies any change in anxiety. She reports that she has some anxiety around issues of moving. She reports that some days anxiety is higher than others. She reports that anxiety can worsen mood. She reports that she noticed some slight improvement in depression. She reports that she started spacing out Prozac when running low, and noticed mood was slightly lower. She reports that her sleep has been disrupted and this is partially due to shoulder pain. Denies SI.   She and her wife plan on trying to sell their house in the future and build a new house.  She reports that they have 3 flights of stairs and an extensive lawn to care for.   Enjoys her dog.   Adderall last filled 01/10/23.    Buspar- caused dizziness Luvox- adverse effects Prozac Doxepin Remeron Wellbutrin Trazodone Abilify Rexulti Olanzapine Seroquel Topamax Clonidine Klonopin Gabapentin Adderall  PHQ2-9    Flowsheet Row Office Visit from 11/06/2022 in [facility]  PHQ-2 Total Score 4  PHQ-9 Total Score 20      Flowsheet Row ED from 11/21/2021 in [facility] Emergency Department at [facility] ED to Hosp-Admission (Discharged) from 10/24/2021 in [facility]  SURGICAL ED to  Hosp-Admission (Discharged) from 10/16/2021 in [facility]  SURGICAL  C-SSRS RISK CATEGORY No Risk No Risk No Risk        Review of Systems:  Review of Systems  Musculoskeletal:  Negative for gait problem.       Shoulder pain  Neurological:        Improved headaches  Psychiatric/Behavioral:         Please refer to HPI    Medications: I have reviewed the patient's current medications.  Current Outpatient Medications  Medication Sig Dispense Refill   [employer] ASPIRIN ADULT LOW DOSE 81 MG chewable tablet Chew 81 mg by mouth daily.     Multiple Vitamin (MULTIVITAMIN WITH MINERALS) TABS tablet Take 1 tablet by mouth daily.     pantoprazole (PROTONIX) 20 MG tablet TAKE 1 TABLET BY MOUTH EVERY DAY 90 tablet 1   topiramate (TOPAMAX) 100 MG tablet Take 1.5 tablets twice daily 270 tablet 1   traMADol (ULTRAM-ER) 200 MG 24 hr tablet Take 200 mg by mouth daily.     acetaminophen (TYLENOL) 500 MG tablet Take 1,000 mg by mouth every 6 (six) hours as needed for moderate pain or headache (pain). (Patient not taking: Reported on 05/15/2022)     [START ON 02/03/2023] amphetamine-dextroamphetamine (ADDERALL) 20 MG tablet Take 1 tablet (20 mg total) by mouth 3 (three) times daily. 90 tablet 0   [START ON 03/03/2023] amphetamine-dextroamphetamine (ADDERALL) 20 MG tablet Take 1 tablet (20 mg total) by mouth 3 (  three) times daily. 90 tablet 0   [START ON 03/31/2023] amphetamine-dextroamphetamine (ADDERALL) 20 MG tablet Take 1 tablet (20 mg total) by mouth 3 (three) times daily. 90 tablet 0   cloNIDine (CATAPRES) 0.1 MG tablet Take 1 tablet (0.1 mg total) by mouth 2 (two) times daily. 180 tablet 0   eszopiclone (LUNESTA) 2 MG TABS tablet Take by mouth.     FLUoxetine (PROZAC) 40 MG capsule Take 1 capsule (40 mg total) by mouth daily. 90 capsule 1   ipratropium (ATROVENT) 0.03 % nasal spray Place into both nostrils.     No current facility-administered medications for this visit.     Medication Side Effects: Other: Increased appetite  Allergies:  Allergies  Allergen Reactions   Hydrocortisone Anaphylaxis   Other Anaphylaxis    Allergy to steroids causing anaphylaxis     Aptiom [Eslicarbazepine] Other (See Comments)    Unknown reaction   Azithromycin Other (See Comments)    Unknown reaction    Bupropion Other (See Comments)     Caused depression   Carbamazepine Other (See Comments)    Unknown reaction    Dilantin [Phenytoin Sodium Extended] Other (See Comments)    Unknown reaction    Diphenhydramine Other (See Comments)    Feels like "spiders are crawling" on her   Divalproex Sodium Other (See Comments)    Unknown reaction    Keppra [Levetiracetam] Other (See Comments)    Unknown reaction    Lamictal [Lamotrigine] Other (See Comments)    Unknown reaction    Lorazepam Swelling and Other (See Comments)    Hallucinations   Lyrica [Pregabalin] Other (See Comments)    Pt reports feeling loopy and confused   Morphine And Codeine Other (See Comments)    Unknown reaction - (high doses)   Prednisone Other (See Comments)    Unknown reaction    Vimpat [Lacosamide] Other (See Comments)    Unknown reaction     Past Medical History:  Diagnosis Date   ADHD    Anxiety    Chronic back pain    from broken back   Depression    Right femoral fracture (HCC)    [facility] spotted fever    may show low Hgb   Seizures (HCC)    has during sleep   Vitamin B 12 deficiency     Past Medical History, Surgical history, Social history, and Family history were reviewed and updated as appropriate.   Please see review of systems for further details on the patient's review from today.   Objective:   Physical Exam:  BP 104/71   Pulse (!) 106   Physical Exam Constitutional:      General: She is not in acute distress. Musculoskeletal:        General: No deformity.  Neurological:     Mental Status: She is alert and oriented to person, place, and  time.     Coordination: Coordination normal.  Psychiatric:        Attention and Perception: Attention and perception normal. She does not perceive auditory or visual hallucinations.        Mood and Affect: Mood is anxious. Affect is not labile, blunt, angry or inappropriate.        Speech: Speech normal.        Behavior: Behavior normal.        Thought Content: Thought content normal. Thought content is not paranoid or delusional. Thought content does not include homicidal or suicidal ideation. Thought content does not  include homicidal or suicidal plan.        Cognition and Memory: Cognition and memory normal.        Judgment: Judgment normal.     Comments: Insight intact Mood presents as less depressed compared to last exam     Lab Review:     Component Value Date/Time   NA 149 (H) 11/21/2021 1331   K 3.5 11/21/2021 1331   CL 115 (H) 11/21/2021 1331   CO2 20 (L) 11/21/2021 1331   GLUCOSE 129 (H) 11/21/2021 1331   BUN 18 11/21/2021 1331   CREATININE 0.97 11/21/2021 1331   CREATININE 0.84 01/05/2020 1607   CALCIUM 9.9 11/21/2021 1331   PROT 7.8 11/21/2021 1331   ALBUMIN 4.3 11/21/2021 1331   AST 15 11/21/2021 1331   AST 19 01/05/2020 1607   ALT 9 11/21/2021 1331   ALT 14 01/05/2020 1607   ALKPHOS 95 11/21/2021 1331   BILITOT 1.0 11/21/2021 1331   BILITOT 0.3 01/05/2020 1607   GFRNONAA >60 11/21/2021 1331   GFRNONAA >60 01/05/2020 1607   GFRAA >60 04/07/2020 1527   GFRAA >60 01/05/2020 1607       Component Value Date/Time   WBC 5.5 11/21/2021 1331   RBC 4.63 11/21/2021 1331   HGB 12.8 11/21/2021 1331   HCT 42.7 11/21/2021 1331   HCT 37.7 01/05/2020 1608   PLT 280 11/21/2021 1331   MCV 92.2 11/21/2021 1331   MCH 27.6 11/21/2021 1331   MCHC 30.0 11/21/2021 1331   RDW 15.1 11/21/2021 1331   LYMPHSABS 0.7 10/24/2021 1545   MONOABS 0.3 10/24/2021 1545   EOSABS 0.0 10/24/2021 1545   BASOSABS 0.0 10/24/2021 1545    No results found for: "POCLITH", "LITHIUM"   No  results found for: "PHENYTOIN", "PHENOBARB", "VALPROATE", "CBMZ"   .res Assessment: Plan:    Pt reports that she would like to increase Prozac to 40 mg daily since she has recently noticed some slight improvement in depressive s/s with Prozac 20 mg daily. Will increase Prozac to 40 mg daily to improve mood and anxiety symptoms.  Continue Adderall 20 mg three times daily for ADHD.  Continue Clonidine 0.1 mg po BID for anxiety.  Continue Topamax 150 mg po BID since pt reports that her headaches have improved with re-starting Topamax. Pt to follow-up in 3 months or sooner if clinically indicated.  Patient advised to contact office with any questions, adverse effects, or acute worsening in signs and symptoms.   Kelli "[name]" was seen today for follow-up.  Diagnoses and all orders for this visit:  Anxiety -     FLUoxetine (PROZAC) 40 MG capsule; Take 1 capsule (40 mg total) by mouth daily. -     cloNIDine (CATAPRES) 0.1 MG tablet; Take 1 tablet (0.1 mg total) by mouth 2 (two) times daily.  Depression, unspecified depression type -     FLUoxetine (PROZAC) 40 MG capsule; Take 1 capsule (40 mg total) by mouth daily.  Attention deficit hyperactivity disorder (ADHD), combined type -     amphetamine-dextroamphetamine (ADDERALL) 20 MG tablet; Take 1 tablet (20 mg total) by mouth 3 (three) times daily. -     amphetamine-dextroamphetamine (ADDERALL) 20 MG tablet; Take 1 tablet (20 mg total) by mouth 3 (three) times daily.     Please see After Visit Summary for patient specific instructions.  Future Appointments  Date Time Provider Department Center  04/30/2023  2:30 PM Corie Chiquito, PMHNP [facility] None    No orders of the defined types  were placed in this encounter.   -------------------------------

## 2023-02-10 ENCOUNTER — Other Ambulatory Visit: Payer: Self-pay | Admitting: Orthopaedic Surgery

## 2023-02-10 DIAGNOSIS — Z01818 Encounter for other preprocedural examination: Secondary | ICD-10-CM

## 2023-02-24 ENCOUNTER — Encounter: Payer: Self-pay | Admitting: Orthopaedic Surgery

## 2023-02-26 ENCOUNTER — Ambulatory Visit
Admission: RE | Admit: 2023-02-26 | Discharge: 2023-02-26 | Disposition: A | Discharge status: Home / Self Care | Payer: Medicare HMO | Source: Ambulatory Visit | Attending: Orthopaedic Surgery | Admitting: Orthopaedic Surgery

## 2023-02-26 DIAGNOSIS — Z01818 Encounter for other preprocedural examination: Secondary | ICD-10-CM

## 2023-03-10 ENCOUNTER — Other Ambulatory Visit: Payer: Self-pay | Admitting: Psychiatry

## 2023-03-10 DIAGNOSIS — F419 Anxiety disorder, unspecified: Secondary | ICD-10-CM

## 2023-03-10 DIAGNOSIS — F32A Depression, unspecified: Secondary | ICD-10-CM

## 2023-04-30 ENCOUNTER — Encounter: Payer: Self-pay | Admitting: Psychiatry

## 2023-04-30 ENCOUNTER — Ambulatory Visit (INDEPENDENT_AMBULATORY_CARE_PROVIDER_SITE_OTHER): Payer: Medicare HMO | Admitting: Psychiatry

## 2023-04-30 DIAGNOSIS — F902 Attention-deficit hyperactivity disorder, combined type: Secondary | ICD-10-CM

## 2023-04-30 DIAGNOSIS — F419 Anxiety disorder, unspecified: Secondary | ICD-10-CM | POA: Diagnosis not present

## 2023-04-30 DIAGNOSIS — G43801 Other migraine, not intractable, with status migrainosus: Secondary | ICD-10-CM | POA: Diagnosis not present

## 2023-04-30 MED ORDER — CLONIDINE HCL 0.1 MG PO TABS
0.1000 mg | ORAL_TABLET | Freq: Two times a day (BID) | ORAL | 1 refills | Status: AC
Start: 2023-04-30 — End: ?

## 2023-04-30 MED ORDER — AMPHETAMINE-DEXTROAMPHETAMINE 20 MG PO TABS
20.0000 mg | ORAL_TABLET | Freq: Three times a day (TID) | ORAL | 0 refills | Status: DC
Start: 2023-07-03 — End: 2023-08-04

## 2023-04-30 MED ORDER — AMPHETAMINE-DEXTROAMPHETAMINE 20 MG PO TABS
20.0000 mg | ORAL_TABLET | Freq: Three times a day (TID) | ORAL | 0 refills | Status: DC
Start: 2023-06-05 — End: 2023-08-04

## 2023-04-30 MED ORDER — TOPIRAMATE 100 MG PO TABS
ORAL_TABLET | ORAL | 1 refills | Status: DC
Start: 2023-04-30 — End: 2024-05-05

## 2023-04-30 MED ORDER — AMPHETAMINE-DEXTROAMPHETAMINE 20 MG PO TABS
20.0000 mg | ORAL_TABLET | Freq: Three times a day (TID) | ORAL | 0 refills | Status: DC
Start: 2023-05-08 — End: 2023-08-04

## 2023-04-30 NOTE — Progress Notes (Signed)
Kelli Navarro [id number] 1951/02/10 72 y.o.  Subjective:   Patient ID:  Kelli Navarro is a 72 y.o. (DOB 03/30/1951) female.  Chief Complaint:  Chief Complaint  Patient presents with   Anxiety    HPI Kelli Navarro presents to the office today for follow-up of anxiety, depression, and ADHD. She reports that she was not able to tolerate increase in Prozac since it made her feel more anxious. She reports that she reduced her dose for 2 weeks and then stopped. She reports that she "continues to have anxiety issues." She reports, "it's an overwhelmed feeling." Notices occ increased HR with anxiety. Denies chest pain. Occasionally feels sad, usually at the end of the day when she is very tired. Energy and motivation have been good. She reports that she typically does not go to bed until 4:30 am and sleeps for a few hours and reports that this has been a long-standing pattern. She reports adequate concentration. Appetite is "so, so" and varies. She reports drinking protein drinks regularly. Denies SI.   She reports occasional difficulty with time management.   She reports, "there's a whole lot going on." They are getting ready to move and have put 1/3 of their belongings in storage and had a yard sale.   She is postponing surgery until after they move.   Adderall last filled 04/10/23.   Buspar- caused dizziness Luvox- adverse effects Prozac- increased anxiety at 40 mg. Ineffective.  Doxepin Remeron Wellbutrin Trazodone Abilify Rexulti Olanzapine Seroquel Topamax Clonidine Klonopin Gabapentin Adderall  PHQ2-9    Flowsheet Row Office Visit from 11/06/2022 in [facility]  PHQ-2 Total Score 4  PHQ-9 Total Score 20      Flowsheet Row ED from 11/21/2021 in [facility] Emergency Department at [facility] ED to Hosp-Admission (Discharged) from 10/24/2021 in [facility]  SURGICAL ED to Hosp-Admission  (Discharged) from 10/16/2021 in [facility]  SURGICAL  C-SSRS RISK CATEGORY No Risk No Risk No Risk        Review of Systems:  Review of Systems  Cardiovascular:  Negative for chest pain.  Musculoskeletal:  Negative for gait problem.  Psychiatric/Behavioral:         Please refer to HPI    Medications: I have reviewed the patient's current medications.  Current Outpatient Medications  Medication Sig Dispense Refill   acetaminophen (TYLENOL) 500 MG tablet Take 1,000 mg by mouth every 6 (six) hours as needed for moderate pain (pain score 4-6) or headache (pain).     rosuvastatin (CRESTOR) 5 MG tablet Take 5 mg by mouth daily.     [START ON 05/08/2023] amphetamine-dextroamphetamine (ADDERALL) 20 MG tablet Take 1 tablet (20 mg total) by mouth 3 (three) times daily. 90 tablet 0   [START ON 06/05/2023] amphetamine-dextroamphetamine (ADDERALL) 20 MG tablet Take 1 tablet (20 mg total) by mouth 3 (three) times daily. 90 tablet 0   [START ON 07/03/2023] amphetamine-dextroamphetamine (ADDERALL) 20 MG tablet Take 1 tablet (20 mg total) by mouth 3 (three) times daily. 90 tablet 0   cloNIDine (CATAPRES) 0.1 MG tablet Take 1 tablet (0.1 mg total) by mouth 2 (two) times daily. May taken an additional 1/2-1 tablet as needed 270 tablet 1   [employer] ASPIRIN ADULT LOW DOSE 81 MG chewable tablet Chew 81 mg by mouth daily.     eszopiclone (LUNESTA) 2 MG TABS tablet Take by mouth.     ipratropium (ATROVENT) 0.03 % nasal  spray Place into both nostrils.     Multiple Vitamin (MULTIVITAMIN WITH MINERALS) TABS tablet Take 1 tablet by mouth daily.     pantoprazole (PROTONIX) 20 MG tablet TAKE 1 TABLET BY MOUTH EVERY DAY 90 tablet 1   topiramate (TOPAMAX) 100 MG tablet Take 1.5 tablets twice daily 270 tablet 1   traMADol (ULTRAM-ER) 200 MG 24 hr tablet Take 200 mg by mouth daily.     No current facility-administered medications for this visit.    Medication Side Effects: None  Allergies:   Allergies  Allergen Reactions   Hydrocortisone Anaphylaxis   Other Anaphylaxis    Allergy to steroids causing anaphylaxis     Aptiom [Eslicarbazepine] Other (See Comments)    Unknown reaction   Azithromycin Other (See Comments)    Unknown reaction    Bupropion Other (See Comments)     Caused depression   Carbamazepine Other (See Comments)    Unknown reaction    Dilantin [Phenytoin Sodium Extended] Other (See Comments)    Unknown reaction    Diphenhydramine Other (See Comments)    Feels like "spiders are crawling" on her   Divalproex Sodium Other (See Comments)    Unknown reaction    Keppra [Levetiracetam] Other (See Comments)    Unknown reaction    Lamictal [Lamotrigine] Other (See Comments)    Unknown reaction    Lorazepam Swelling and Other (See Comments)    Hallucinations   Lyrica [Pregabalin] Other (See Comments)    Pt reports feeling loopy and confused   Morphine And Codeine Other (See Comments)    Unknown reaction - (high doses)   Prednisone Other (See Comments)    Unknown reaction    Vimpat [Lacosamide] Other (See Comments)    Unknown reaction     Past Medical History:  Diagnosis Date   ADHD    Anxiety    Chronic back pain    from broken back   Depression    Right femoral fracture (HCC)    [facility] spotted fever    may show low Hgb   Seizures (HCC)    has during sleep   Vitamin B 12 deficiency     Past Medical History, Surgical history, Social history, and Family history were reviewed and updated as appropriate.   Please see review of systems for further details on the patient's review from today.   Objective:   Physical Exam:  BP 121/76   Pulse 81   Physical Exam Constitutional:      General: She is not in acute distress. Musculoskeletal:        General: No deformity.  Neurological:     Mental Status: She is alert and oriented to person, place, and time.     Coordination: Coordination normal.  Psychiatric:        Attention  and Perception: Attention and perception normal. She does not perceive auditory or visual hallucinations.        Mood and Affect: Mood is anxious. Mood is not depressed. Affect is not labile, blunt, angry or inappropriate.        Speech: Speech normal.        Behavior: Behavior normal.        Thought Content: Thought content normal. Thought content is not paranoid or delusional. Thought content does not include homicidal or suicidal ideation. Thought content does not include homicidal or suicidal plan.        Cognition and Memory: Cognition and memory normal.  Judgment: Judgment normal.     Comments: Insight intact     Lab Review:     Component Value Date/Time   NA 149 (H) 11/21/2021 1331   K 3.5 11/21/2021 1331   CL 115 (H) 11/21/2021 1331   CO2 20 (L) 11/21/2021 1331   GLUCOSE 129 (H) 11/21/2021 1331   BUN 18 11/21/2021 1331   CREATININE 0.97 11/21/2021 1331   CREATININE 0.84 01/05/2020 1607   CALCIUM 9.9 11/21/2021 1331   PROT 7.8 11/21/2021 1331   ALBUMIN 4.3 11/21/2021 1331   AST 15 11/21/2021 1331   AST 19 01/05/2020 1607   ALT 9 11/21/2021 1331   ALT 14 01/05/2020 1607   ALKPHOS 95 11/21/2021 1331   BILITOT 1.0 11/21/2021 1331   BILITOT 0.3 01/05/2020 1607   GFRNONAA >60 11/21/2021 1331   GFRNONAA >60 01/05/2020 1607   GFRAA >60 04/07/2020 1527   GFRAA >60 01/05/2020 1607       Component Value Date/Time   WBC 5.5 11/21/2021 1331   RBC 4.63 11/21/2021 1331   HGB 12.8 11/21/2021 1331   HCT 42.7 11/21/2021 1331   HCT 37.7 01/05/2020 1608   PLT 280 11/21/2021 1331   MCV 92.2 11/21/2021 1331   MCH 27.6 11/21/2021 1331   MCHC 30.0 11/21/2021 1331   RDW 15.1 11/21/2021 1331   LYMPHSABS 0.7 10/24/2021 1545   MONOABS 0.3 10/24/2021 1545   EOSABS 0.0 10/24/2021 1545   BASOSABS 0.0 10/24/2021 1545    No results found for: "POCLITH", "LITHIUM"   No results found for: "PHENYTOIN", "PHENOBARB", "VALPROATE", "CBMZ"   .res Assessment: Plan:   She reports  that she prefers not to start a new medication at this time, and instead asks about taking additional clonidine as needed since she reports that she has done so on a few occasions with good response and without side effects.  Will increase Clonidine to 0.1 mg twice daily and an additional 1/2-1 tablet as needed for anxiety.  Continue Adderall 20 mg three times daily for ADHD.  Continue Topamax 150 mg po BID for anxiety and headaches.  Lunesta prescribed by medical provider.  Pt to follow-up in 3 months or sooner if clinically indicated.  Patient advised to contact office with any questions, adverse effects, or acute worsening in signs and symptoms.   Kelli "Boone Master" was seen today for anxiety.  Diagnoses and all orders for this visit:  Attention deficit hyperactivity disorder (ADHD), combined type -     amphetamine-dextroamphetamine (ADDERALL) 20 MG tablet; Take 1 tablet (20 mg total) by mouth 3 (three) times daily. -     amphetamine-dextroamphetamine (ADDERALL) 20 MG tablet; Take 1 tablet (20 mg total) by mouth 3 (three) times daily. -     amphetamine-dextroamphetamine (ADDERALL) 20 MG tablet; Take 1 tablet (20 mg total) by mouth 3 (three) times daily.  Anxiety -     cloNIDine (CATAPRES) 0.1 MG tablet; Take 1 tablet (0.1 mg total) by mouth 2 (two) times daily. May taken an additional 1/2-1 tablet as needed -     topiramate (TOPAMAX) 100 MG tablet; Take 1.5 tablets twice daily  Other migraine with status migrainosus, not intractable -     topiramate (TOPAMAX) 100 MG tablet; Take 1.5 tablets twice daily     Please see After Visit Summary for patient specific instructions.  No future appointments.  No orders of the defined types were placed in this encounter.   -------------------------------

## 2023-05-20 ENCOUNTER — Encounter: Payer: Self-pay | Admitting: Psychiatry

## 2023-07-15 ENCOUNTER — Telehealth: Payer: Self-pay

## 2023-07-15 NOTE — Telephone Encounter (Signed)
 Prior Authorization Amphetamine-Dextroamphetamine 20MG  tablets #90/30 [employer]  Approved Effective:  07/08/23-07/06/24

## 2023-08-04 ENCOUNTER — Encounter: Payer: Self-pay | Admitting: Physician Assistant

## 2023-08-04 ENCOUNTER — Ambulatory Visit (INDEPENDENT_AMBULATORY_CARE_PROVIDER_SITE_OTHER): Payer: Medicare HMO | Admitting: Physician Assistant

## 2023-08-04 VITALS — BP 152/88 | HR 80

## 2023-08-04 DIAGNOSIS — G43801 Other migraine, not intractable, with status migrainosus: Secondary | ICD-10-CM

## 2023-08-04 DIAGNOSIS — F902 Attention-deficit hyperactivity disorder, combined type: Secondary | ICD-10-CM | POA: Diagnosis not present

## 2023-08-04 DIAGNOSIS — F419 Anxiety disorder, unspecified: Secondary | ICD-10-CM

## 2023-08-04 MED ORDER — AMPHETAMINE-DEXTROAMPHETAMINE 20 MG PO TABS: 20.0000 mg | ORAL_TABLET | Freq: Three times a day (TID) | ORAL | 0 refills | Status: DC

## 2023-08-04 MED ORDER — AMPHETAMINE-DEXTROAMPHETAMINE 20 MG PO TABS
20.0000 mg | ORAL_TABLET | Freq: Three times a day (TID) | ORAL | 0 refills | Status: AC
Start: 2023-08-06 — End: ?

## 2023-08-04 NOTE — Progress Notes (Signed)
[facility]  Patient ID: Kelli Navarro,  MRN: [id number]  PCP: Susa Loffler, NP  Date of Evaluation: 08/04/2023 Time spent:20 minutes  Chief Complaint:  Chief Complaint   ADHD; Follow-up    HISTORY/CURRENT STATUS: HPI Transferring to my care from Corie Chiquito, NP who is no longer with our practice.  Meds are working well. States that attention is good without easy distractibility.  Able to focus on things and finish tasks to completion.   Patient is able to enjoy things.  Energy and motivation are good.  No extreme sadness, tearfulness, or feelings of hopelessness.  Sleeps well most of the time. ADLs and personal hygiene are normal.  Appetite has not changed.  Weight is stable.  No change in memory.  Denies suicidal or homicidal thoughts.  Patient denies increased energy with decreased need for sleep, increased talkativeness, racing thoughts, impulsivity or risky behaviors, increased spending, increased libido, grandiosity, increased irritability or anger, paranoia, or hallucinations.  Migraines are well controlled.  The Topamax helps.  Denies dizziness, syncope, seizures, numbness, tingling, tremor, tics, unsteady gait, slurred speech, confusion. Denies muscle or joint pain, stiffness, or dystonia.  Individual Medical History/ Review of Systems: Changes? :No   Past medications for mental health diagnoses include: Buspar- caused dizziness Luvox- adverse effects Prozac- increased anxiety at 40 mg. Ineffective.  Doxepin Remeron Wellbutrin Trazodone Abilify Rexulti Olanzapine Seroquel Topamax Clonidine Klonopin Gabapentin Adderall  Allergies: Hydrocortisone, Other, Aptiom [eslicarbazepine], Azithromycin, Bupropion, Carbamazepine, Dilantin [phenytoin sodium extended], Diphenhydramine, Divalproex sodium, Keppra [levetiracetam], Lamictal [lamotrigine], Lorazepam, Lyrica [pregabalin], Morphine and codeine, Prednisone, and Vimpat [lacosamide]  Current  Medications:  Current Outpatient Medications:    acetaminophen (TYLENOL) 500 MG tablet, Take 1,000 mg by mouth every 6 (six) hours as needed for moderate pain (pain score 4-6) or headache (pain)., Disp: , Rfl:    cloNIDine (CATAPRES) 0.1 MG tablet, Take 1 tablet (0.1 mg total) by mouth 2 (two) times daily. May taken an additional 1/2-1 tablet as needed, Disp: 270 tablet, Rfl: 1   [facility] ASPIRIN ADULT LOW DOSE 81 MG chewable tablet, Chew 81 mg by mouth daily., Disp: , Rfl:    ipratropium (ATROVENT) 0.03 % nasal spray, Place into both nostrils., Disp: , Rfl:    Multiple Vitamin (MULTIVITAMIN WITH MINERALS) TABS tablet, Take 1 tablet by mouth daily., Disp: , Rfl:    pantoprazole (PROTONIX) 20 MG tablet, TAKE 1 TABLET BY MOUTH EVERY DAY, Disp: 90 tablet, Rfl: 1   rosuvastatin (CRESTOR) 5 MG tablet, Take 2.5 mg by mouth daily., Disp: , Rfl:    topiramate (TOPAMAX) 100 MG tablet, Take 1.5 tablets twice daily, Disp: 270 tablet, Rfl: 1   traMADol (ULTRAM-ER) 200 MG 24 hr tablet, Take 200 mg by mouth daily., Disp: , Rfl:    [START ON 08/06/2023] amphetamine-dextroamphetamine (ADDERALL) 20 MG tablet, Take 1 tablet (20 mg total) by mouth 3 (three) times daily., Disp: 90 tablet, Rfl: 0   [START ON 09/04/2023] amphetamine-dextroamphetamine (ADDERALL) 20 MG tablet, Take 1 tablet (20 mg total) by mouth 3 (three) times daily., Disp: 90 tablet, Rfl: 0   [START ON 10/01/2023] amphetamine-dextroamphetamine (ADDERALL) 20 MG tablet, Take 1 tablet (20 mg total) by mouth 3 (three) times daily., Disp: 90 tablet, Rfl: 0   eszopiclone (LUNESTA) 2 MG TABS tablet, Take by mouth., Disp: , Rfl:  Medication Side Effects: none  Family Medical/ Social History: Changes? No  MENTAL HEALTH EXAM:  Blood pressure (!) 152/88, pulse 80.There is no height or weight on file to calculate  BMI.  General Appearance: Casual and Well Groomed  Eye Contact:  Good  Speech:  Clear and Coherent and Normal Rate  Volume:  Normal  Mood:  Euthymic   Affect:  Congruent  Thought Process:  Goal Directed and Descriptions of Associations: Circumstantial  Orientation:  Full (Time, Place, and Person)  Thought Content: Logical   Suicidal Thoughts:  No  Homicidal Thoughts:  No  Memory:  WNL  Judgement:  Good  Insight:  Good  Psychomotor Activity:  Normal  Concentration:  Concentration: Good  Recall:  Good  Fund of Knowledge: Good  Language: Good  Assets:  Desire for Improvement Financial Resources/Insurance Housing Transportation  ADL's:  Intact  Cognition: WNL  Prognosis:  Good   DIAGNOSES:    ICD-10-CM   1. Anxiety  F41.9     2. Attention deficit hyperactivity disorder (ADHD), combined type  F90.2 amphetamine-dextroamphetamine (ADDERALL) 20 MG tablet    amphetamine-dextroamphetamine (ADDERALL) 20 MG tablet    amphetamine-dextroamphetamine (ADDERALL) 20 MG tablet    3. Other migraine with status migrainosus, not intractable  G43.801       Receiving Psychotherapy: No   RECOMMENDATIONS:  PDMP reviewed.  Adderall filled 07/09/2023, Lunesta filled 05/18/2023, also on tramadol. I provided 20 minutes of face to face time during this encounter, including time spent before and after the visit in records review, medical decision making, counseling pertinent to today's visit, and charting.   She is doing well with current treatment so no changes will be made.  Her blood pressure is a little elevated but she states it is always that way at the doctor's office.  Continue Adderall 20 mg, 1 p.o. 3 times daily. Continue clonidine 0.1 mg, 1 p.o. twice daily, and may take an additional 1/2-1 during the day as needed anxiety. Continue Lunesta 2 mg, 1 nightly as needed. Continue Topamax 100 mg, 1.5 pills twice daily. Return in 3 months.  Melony Overly, PA-C

## 2023-09-16 ENCOUNTER — Telehealth: Payer: Self-pay | Admitting: Physician Assistant

## 2023-09-16 NOTE — Telephone Encounter (Signed)
 Patient lvm at 3:00 stating that pharmacy informed her they do not have 20mg  Adderall in stock but do have 30mg . Needs new prescription "converted" to 30mg . Ph: [phone] Appt 4/25 [facility] [address] Jacky Kindle

## 2023-09-17 ENCOUNTER — Other Ambulatory Visit: Payer: Self-pay

## 2023-09-17 MED ORDER — AMPHETAMINE-DEXTROAMPHETAMINE 30 MG PO TABS: 15.0000 mg | ORAL_TABLET | Freq: Four times a day (QID) | ORAL | 0 refills | Status: DC

## 2023-09-17 NOTE — Telephone Encounter (Signed)
 Please see message and advise. She has not tried finding at another pharmacy.  Will pend as appropriate.

## 2023-09-17 NOTE — Telephone Encounter (Signed)
 It's ok to change to 30 mg, 1 po bid, or 1/2 po qid. Please pend, #60.  Thank you.

## 2023-09-17 NOTE — Telephone Encounter (Signed)
 Rx for 30 mg QID, #60 pended.

## 2023-10-04 ENCOUNTER — Other Ambulatory Visit: Payer: Self-pay | Admitting: Gastroenterology

## 2023-10-04 DIAGNOSIS — K259 Gastric ulcer, unspecified as acute or chronic, without hemorrhage or perforation: Secondary | ICD-10-CM

## 2023-10-30 ENCOUNTER — Ambulatory Visit (INDEPENDENT_AMBULATORY_CARE_PROVIDER_SITE_OTHER): Payer: Medicare HMO | Admitting: Physician Assistant

## 2023-11-07 ENCOUNTER — Other Ambulatory Visit: Payer: Self-pay | Admitting: Gastroenterology

## 2023-11-07 DIAGNOSIS — K259 Gastric ulcer, unspecified as acute or chronic, without hemorrhage or perforation: Secondary | ICD-10-CM

## 2023-11-19 ENCOUNTER — Ambulatory Visit: Admitting: Physician Assistant

## 2023-12-12 ENCOUNTER — Other Ambulatory Visit: Payer: Self-pay | Admitting: Gastroenterology

## 2023-12-12 DIAGNOSIS — K259 Gastric ulcer, unspecified as acute or chronic, without hemorrhage or perforation: Secondary | ICD-10-CM

## 2023-12-18 ENCOUNTER — Telehealth: Payer: Self-pay | Admitting: Physician Assistant

## 2023-12-18 ENCOUNTER — Other Ambulatory Visit: Payer: Self-pay

## 2023-12-18 DIAGNOSIS — F902 Attention-deficit hyperactivity disorder, combined type: Secondary | ICD-10-CM

## 2023-12-18 MED ORDER — AMPHETAMINE-DEXTROAMPHETAMINE 20 MG PO TABS: 20.0000 mg | ORAL_TABLET | Freq: Three times a day (TID) | ORAL | 0 refills | Status: DC

## 2023-12-18 NOTE — Telephone Encounter (Signed)
 Pt  called and needs a refill on her adderall 20 mg . [facility] [address]. Her next appt is 12/23/23

## 2023-12-18 NOTE — Telephone Encounter (Signed)
 Pended Adderall 20 mg, [facility]90, [facility] [address].

## 2023-12-20 ENCOUNTER — Other Ambulatory Visit: Payer: Self-pay | Admitting: Gastroenterology

## 2023-12-20 DIAGNOSIS — K259 Gastric ulcer, unspecified as acute or chronic, without hemorrhage or perforation: Secondary | ICD-10-CM

## 2023-12-23 ENCOUNTER — Ambulatory Visit: Admitting: Physician Assistant

## 2023-12-23 ENCOUNTER — Encounter: Payer: Self-pay | Admitting: Physician Assistant

## 2023-12-23 DIAGNOSIS — F902 Attention-deficit hyperactivity disorder, combined type: Secondary | ICD-10-CM

## 2023-12-23 DIAGNOSIS — F419 Anxiety disorder, unspecified: Secondary | ICD-10-CM

## 2023-12-23 DIAGNOSIS — F4321 Adjustment disorder with depressed mood: Secondary | ICD-10-CM | POA: Diagnosis not present

## 2023-12-23 DIAGNOSIS — Z634 Disappearance and death of family member: Secondary | ICD-10-CM

## 2023-12-23 DIAGNOSIS — F32A Depression, unspecified: Secondary | ICD-10-CM | POA: Diagnosis not present

## 2023-12-23 MED ORDER — AMPHETAMINE-DEXTROAMPHETAMINE 20 MG PO TABS: 20.0000 mg | ORAL_TABLET | Freq: Three times a day (TID) | ORAL | 0 refills | Status: DC

## 2023-12-23 MED ORDER — AMPHETAMINE-DEXTROAMPHETAMINE 20 MG PO TABS
20.0000 mg | ORAL_TABLET | Freq: Three times a day (TID) | ORAL | 0 refills | Status: DC
Start: 2024-02-15 — End: 2024-02-16

## 2023-12-23 MED ORDER — CLONIDINE HCL 0.1 MG PO TABS: 0.1000 mg | ORAL_TABLET | Freq: Two times a day (BID) | ORAL | 1 refills | Status: AC

## 2023-12-23 NOTE — Progress Notes (Signed)
 [facility]  Patient ID: Kelli Navarro,  MRN: [id number]  PCP: Ziglar, Samantha P, NP  Date of Evaluation: 12/23/2023 Time spent:20 minutes  Chief Complaint:  Chief Complaint   Insomnia; ADHD; Follow-up    HISTORY/CURRENT STATUS: HPI For routine med check.  Her son died suddenly from an MI at 73 yo on Easter.  Very sad.  Doesn't feel like being around people right now.  Doesn't like making decisions.  She and her wife put off selling their home and moving.  Just not ready.  Energy and motivation are ok.  Sleeps ok.  Reads in bed sometimes.  ADLs and personal hygiene are normal.   Appetite has not changed.  Weight is stable.  Gets anxious at times, no PA.  Clonidine  helps.  No manic sx, psychosis, or delirium.  Denies suicidal or homicidal thoughts.  Migraines are well controlled.  The Topamax  helps.  Denies dizziness, syncope, seizures, numbness, tingling, tremor, tics, unsteady gait, slurred speech, confusion. Denies muscle or joint pain, stiffness, or dystonia.  Individual Medical History/ Review of Systems: Changes? :No   Past medications for mental health diagnoses include: Buspar- caused dizziness Luvox- adverse effects Prozac - increased anxiety at 40 mg. Ineffective.  Doxepin  Remeron  Wellbutrin Trazodone  Abilify  Rexulti  Olanzapine  Seroquel  Topamax  Clonidine  Klonopin  Gabapentin  Adderall  Allergies: Hydrocortisone, Other, Aptiom [eslicarbazepine], Azithromycin, Bupropion, Carbamazepine, Dilantin [phenytoin sodium extended], Diphenhydramine , Divalproex sodium, Keppra [levetiracetam], Lamictal [lamotrigine], Lorazepam , Lyrica [pregabalin], Morphine  and codeine, Prednisone, and Vimpat [lacosamide]  Current Medications:  Current Outpatient Medications:    acetaminophen  (TYLENOL ) 500 MG tablet, Take 1,000 mg by mouth every 6 (six) hours as needed for moderate pain (pain score 4-6) or headache (pain)., Disp: , Rfl:    amLODipine (NORVASC) 2.5 MG tablet,  Take 2.5 mg by mouth daily., Disp: , Rfl:    [facility] ASPIRIN  ADULT LOW DOSE 81 MG chewable tablet, Chew 81 mg by mouth daily., Disp: , Rfl:    ipratropium (ATROVENT ) 0.03 % nasal spray, Place into both nostrils., Disp: , Rfl:    Multiple Vitamin (MULTIVITAMIN WITH MINERALS) TABS tablet, Take 1 tablet by mouth daily., Disp: , Rfl:    pantoprazole  (PROTONIX ) 20 MG tablet, TAKE 1 TABLET BY MOUTH EVERY DAY, Disp: 90 tablet, Rfl: 1   rosuvastatin (CRESTOR) 5 MG tablet, Take 2.5 mg by mouth daily., Disp: , Rfl:    topiramate  (TOPAMAX ) 100 MG tablet, Take 1.5 tablets twice daily, Disp: 270 tablet, Rfl: 1   traMADol  (ULTRAM -ER) 200 MG 24 hr tablet, Take 200 mg by mouth daily., Disp: , Rfl:    [START ON 01/16/2024] amphetamine -dextroamphetamine  (ADDERALL) 20 MG tablet, Take 1 tablet (20 mg total) by mouth 3 (three) times daily., Disp: 90 tablet, Rfl: 0   [START ON 02/15/2024] amphetamine -dextroamphetamine  (ADDERALL) 20 MG tablet, Take 1 tablet (20 mg total) by mouth 3 (three) times daily., Disp: 90 tablet, Rfl: 0   [START ON 03/16/2024] amphetamine -dextroamphetamine  (ADDERALL) 20 MG tablet, Take 1 tablet (20 mg total) by mouth 3 (three) times daily., Disp: 90 tablet, Rfl: 0   cloNIDine  (CATAPRES ) 0.1 MG tablet, Take 1 tablet (0.1 mg total) by mouth 2 (two) times daily. May taken an additional 1/2-1 tablet as needed, Disp: 270 tablet, Rfl: 1   eszopiclone (LUNESTA) 2 MG TABS tablet, Take by mouth. (Patient not taking: Reported on 12/23/2023), Disp: , Rfl:  Medication Side Effects: none  Family Medical/ Social History: Changes? Son died from an MI. See HPI  MENTAL HEALTH EXAM:  There were no vitals taken for  this visit.There is no height or weight on file to calculate BMI.  General Appearance: Casual and Well Groomed  Eye Contact:  Good  Speech:  Clear and Coherent and Normal Rate  Volume:  Normal  Mood:  sad  Affect:  tearful when talking about her son  Thought Process:  Goal Directed and Descriptions of  Associations: Circumstantial  Orientation:  Full (Time, Place, and Person)  Thought Content: Logical   Suicidal Thoughts:  No  Homicidal Thoughts:  No  Memory:  WNL  Judgement:  Good  Insight:  Good  Psychomotor Activity:  Normal  Concentration:  Concentration: Good  Recall:  Good  Fund of Knowledge: Good  Language: Good  Assets:  Desire for Improvement Financial Resources/Insurance Housing Transportation  ADL's:  Intact  Cognition: WNL  Prognosis:  Good   DIAGNOSES:    ICD-10-CM   1. Attention deficit hyperactivity disorder (ADHD), combined type  F90.2 amphetamine -dextroamphetamine  (ADDERALL) 20 MG tablet    amphetamine -dextroamphetamine  (ADDERALL) 20 MG tablet    amphetamine -dextroamphetamine  (ADDERALL) 20 MG tablet    2. Depression, unspecified depression type  F32.A     3. Anxiety  F41.9 cloNIDine  (CATAPRES ) 0.1 MG tablet    4. Grief at loss of child  F43.21    Z63.4      Receiving Psychotherapy: No   RECOMMENDATIONS:  PDMP reviewed.  Adderall filled 12/18/2023. I provided 20 minutes of face to face time during this encounter, including time spent before and after the visit in records review, medical decision making, counseling pertinent to today's visit, and charting.   My sympathy in the loss of her son.   As far as her meds go, she's stable so no changes are needed.   Continue Adderall 20 mg, 1 p.o. 3 times daily. Continue clonidine  0.1 mg, 1 p.o. twice daily, and may take an additional 1/2-1 during the day as needed anxiety. Continue Lunesta 2 mg, 1 nightly as needed. (Rarely takes) Continue Topamax  100 mg, 1.5 pills twice daily. Recommend grief counseling through hospice.  Return in 6 months.  Marvia Slocumb, PA-C

## 2024-02-16 ENCOUNTER — Telehealth: Payer: Self-pay | Admitting: Physician Assistant

## 2024-02-16 ENCOUNTER — Other Ambulatory Visit: Payer: Self-pay

## 2024-02-16 DIAGNOSIS — F902 Attention-deficit hyperactivity disorder, combined type: Secondary | ICD-10-CM

## 2024-02-16 MED ORDER — AMPHETAMINE-DEXTROAMPHETAMINE 20 MG PO TABS: 20.0000 mg | ORAL_TABLET | Freq: Three times a day (TID) | ORAL | 0 refills | Status: DC

## 2024-02-16 NOTE — Telephone Encounter (Signed)
 Pt called reporting she has new pharmacy [facility] [address] [facility]. Please send Adderall Rx for August & September to new location

## 2024-02-25 ENCOUNTER — Ambulatory Visit: Admitting: Physician Assistant

## 2024-03-31 ENCOUNTER — Ambulatory Visit: Admitting: Physician Assistant

## 2024-03-31 ENCOUNTER — Encounter: Payer: Self-pay | Admitting: Physician Assistant

## 2024-03-31 DIAGNOSIS — G4700 Insomnia, unspecified: Secondary | ICD-10-CM | POA: Diagnosis not present

## 2024-03-31 DIAGNOSIS — F902 Attention-deficit hyperactivity disorder, combined type: Secondary | ICD-10-CM

## 2024-03-31 MED ORDER — AMPHETAMINE-DEXTROAMPHETAMINE 20 MG PO TABS: 20.0000 mg | ORAL_TABLET | Freq: Three times a day (TID) | ORAL | 0 refills | Status: DC

## 2024-03-31 MED ORDER — AMPHETAMINE-DEXTROAMPHETAMINE 20 MG PO TABS: 20.0000 mg | ORAL_TABLET | Freq: Three times a day (TID) | ORAL | 0 refills | Status: AC

## 2024-03-31 MED ORDER — AMITRIPTYLINE HCL 10 MG PO TABS: 10.0000 mg | ORAL_TABLET | Freq: Every evening | ORAL | 1 refills | Status: DC | PRN

## 2024-03-31 NOTE — Progress Notes (Signed)
 [facility]  Patient ID: Kelli Navarro,  MRN: [id number]  PCP: Ziglar, Samantha P, NP  Date of Evaluation: 03/31/2024 Time spent:20 minutes  Chief Complaint:  Chief Complaint   ADHD; Insomnia; Follow-up    HISTORY/CURRENT STATUS: HPI  Between visits to discuss insomnia  Her PCP told her she needs more sleep. [name] has always had insomnia but it has gotten worse.  Sometimes sleeps only a few hours.  Trouble falling asleep and staying asleep. Not napping. No snoring.   Patient is able to enjoy things.  Energy and motivation are good.  No extreme sadness, tearfulness, or feelings of hopelessness.  ADLs and personal hygiene are normal.   No change in focus or memory.  Appetite has not changed.  Weight is stable.  No mania, delirium, AH/VH.  No SI/HI.  Individual Medical History/ Review of Systems: Changes? :No   Past medications for mental health diagnoses include: Buspar- caused dizziness Luvox- adverse effects Prozac - increased anxiety at 40 mg. Ineffective.  Doxepin  Remeron  Wellbutrin Trazodone  Abilify  Rexulti  Olanzapine  Seroquel  Topamax  Clonidine  Klonopin  Gabapentin  Adderall  Allergies: Hydrocortisone, Other, Aptiom [eslicarbazepine], Azithromycin, Bupropion, Carbamazepine, Dilantin [phenytoin sodium extended], Diphenhydramine , Divalproex sodium, Keppra [levetiracetam], Lamictal [lamotrigine], Lorazepam , Lyrica [pregabalin], Morphine  and codeine, Prednisone, and Vimpat [lacosamide]  Current Medications:  Current Outpatient Medications:    amitriptyline  (ELAVIL ) 10 MG tablet, Take 1 tablet (10 mg total) by mouth at bedtime as needed for sleep., Disp: 30 tablet, Rfl: 1   amLODipine (NORVASC) 2.5 MG tablet, Take 2.5 mg by mouth daily., Disp: , Rfl:    cloNIDine  (CATAPRES ) 0.1 MG tablet, Take 1 tablet (0.1 mg total) by mouth 2 (two) times daily. May taken an additional 1/2-1 tablet as needed, Disp: 270 tablet, Rfl: 1   [facility] ASPIRIN  ADULT LOW DOSE 81 MG  chewable tablet, Chew 81 mg by mouth daily., Disp: , Rfl:    ipratropium (ATROVENT ) 0.03 % nasal spray, Place into both nostrils., Disp: , Rfl:    Multiple Vitamin (MULTIVITAMIN WITH MINERALS) TABS tablet, Take 1 tablet by mouth daily., Disp: , Rfl:    rosuvastatin (CRESTOR) 5 MG tablet, Take 2.5 mg by mouth daily., Disp: , Rfl:    topiramate  (TOPAMAX ) 100 MG tablet, Take 1.5 tablets twice daily, Disp: 270 tablet, Rfl: 1   traMADol  (ULTRAM -ER) 200 MG 24 hr tablet, Take 200 mg by mouth daily., Disp: , Rfl:    acetaminophen  (TYLENOL ) 500 MG tablet, Take 1,000 mg by mouth every 6 (six) hours as needed for moderate pain (pain score 4-6) or headache (pain)., Disp: , Rfl:    [START ON 04/15/2024] amphetamine -dextroamphetamine  (ADDERALL) 20 MG tablet, Take 1 tablet (20 mg total) by mouth 3 (three) times daily., Disp: 90 tablet, Rfl: 0   [START ON 05/15/2024] amphetamine -dextroamphetamine  (ADDERALL) 20 MG tablet, Take 1 tablet (20 mg total) by mouth 3 (three) times daily., Disp: 90 tablet, Rfl: 0   [START ON 06/13/2024] amphetamine -dextroamphetamine  (ADDERALL) 20 MG tablet, Take 1 tablet (20 mg total) by mouth 3 (three) times daily., Disp: 90 tablet, Rfl: 0   eszopiclone (LUNESTA) 2 MG TABS tablet, Take by mouth. (Patient not taking: Reported on 03/31/2024), Disp: , Rfl:    pantoprazole  (PROTONIX ) 20 MG tablet, TAKE 1 TABLET BY MOUTH EVERY DAY, Disp: 90 tablet, Rfl: 1   zaleplon  (SONATA ) 5 MG capsule, Take 1 at bedtime prn, may repeat 1 for mid nocturnal awakening as needed as long as she has 3 to 4 hours left to sleep., Disp: 30 capsule, Rfl: 0 Medication  Side Effects: none  Family Medical/ Social History: Changes? Son died from an MI. See HPI  MENTAL HEALTH EXAM:  There were no vitals taken for this visit.There is no height or weight on file to calculate BMI.  General Appearance: Casual and Well Groomed  Eye Contact:  Good  Speech:  Clear and Coherent and Normal Rate  Volume:  Normal  Mood:  Euthymic   Affect:  Congruent  Thought Process:  Goal Directed and Descriptions of Associations: Circumstantial  Orientation:  Full (Time, Place, and Person)  Thought Content: Logical   Suicidal Thoughts:  No  Homicidal Thoughts:  No  Memory:  WNL  Judgement:  Good  Insight:  Good  Psychomotor Activity:  Normal  Concentration:  Concentration: Good  Recall:  Good  Fund of Knowledge: Good  Language: Good  Assets:  Desire for Improvement Financial Resources/Insurance Housing Transportation  ADL's:  Intact  Cognition: WNL  Prognosis:  Good   DIAGNOSES:    ICD-10-CM   1. Attention deficit hyperactivity disorder (ADHD), combined type  F90.2 amphetamine -dextroamphetamine  (ADDERALL) 20 MG tablet    amphetamine -dextroamphetamine  (ADDERALL) 20 MG tablet    amphetamine -dextroamphetamine  (ADDERALL) 20 MG tablet    2. Insomnia, unspecified type  G47.00       Receiving Psychotherapy: No   RECOMMENDATIONS:  PDMP reviewed.  Adderall filled 03/17/2024.  Tramadol  filled 03/21/2024. I provided approximately 20 minutes of face to face time during this encounter, including time spent before and after the visit in records review, medical decision making, counseling pertinent to today's visit, and charting.   Sleep hygiene discussed. I recommend adding Elavil . Explained that it's a TCA but we're not using it for depression, but for sleep. She doesn't want to take it, 'I can't take antidepressants.' After further discussion, she's willing to try. Also discussed Sonata . If the Elavil  isn't effective, then we'll try that. I prefer not to add a controlled substance, but we may need to. Consider sleep study.   Start Elavil  10 mg, 1 at bedtime prn sleep. Continue Adderall 20 mg, 1 Navarro.o. 3 times daily. Continue clonidine  0.1 mg, 1 Navarro.o. twice daily, and may take an additional 1/2-1 during the day as needed anxiety. Continue Topamax  100 mg, 1.5 pills twice daily. Return in 6 weeks.    Verneita Cooks, PA-C

## 2024-04-01 ENCOUNTER — Other Ambulatory Visit: Payer: Self-pay

## 2024-04-01 ENCOUNTER — Telehealth: Payer: Self-pay | Admitting: Physician Assistant

## 2024-04-01 DIAGNOSIS — F5105 Insomnia due to other mental disorder: Secondary | ICD-10-CM

## 2024-04-01 MED ORDER — ZALEPLON 5 MG PO CAPS: ORAL_CAPSULE | ORAL | 0 refills | Status: DC

## 2024-04-01 NOTE — Telephone Encounter (Signed)
 Pt left vm saying she can't take the medication prescribed to her yesterday Elavil . She remembers taking it previously and it gave her hives and swelling and difficulty swallowing.

## 2024-04-01 NOTE — Telephone Encounter (Signed)
 Pended

## 2024-04-01 NOTE — Telephone Encounter (Signed)
 Please see message from patient. I don't see Elavil /amitriptyline  mentioned on allergies, either in your notes or in medication history.

## 2024-04-01 NOTE — Telephone Encounter (Signed)
 I don't see it listed in allergies either.  We discussed Sonata  as Plan B. Please pend Sonata  5 mg #30, 1 po nightly as needed sleep, may repeat 1 for mid nocturnal awakening as needed as long as she has 3 to 4 hours left to sleep.  No refills.

## 2024-05-05 ENCOUNTER — Telehealth: Payer: Self-pay | Admitting: Physician Assistant

## 2024-05-05 DIAGNOSIS — G43801 Other migraine, not intractable, with status migrainosus: Secondary | ICD-10-CM

## 2024-05-05 DIAGNOSIS — F419 Anxiety disorder, unspecified: Secondary | ICD-10-CM

## 2024-05-05 MED ORDER — TOPIRAMATE 100 MG PO TABS
ORAL_TABLET | ORAL | 0 refills | Status: AC
Start: 2024-05-05 — End: ?

## 2024-05-05 NOTE — Telephone Encounter (Signed)
 Sent!

## 2024-05-05 NOTE — Telephone Encounter (Signed)
  Pt needs rf of Topamax      [facility] [address]

## 2024-05-26 ENCOUNTER — Ambulatory Visit (INDEPENDENT_AMBULATORY_CARE_PROVIDER_SITE_OTHER): Admitting: Physician Assistant

## 2024-05-26 ENCOUNTER — Encounter: Payer: Self-pay | Admitting: Physician Assistant

## 2024-05-26 DIAGNOSIS — F99 Mental disorder, not otherwise specified: Secondary | ICD-10-CM | POA: Diagnosis not present

## 2024-05-26 DIAGNOSIS — R5381 Other malaise: Secondary | ICD-10-CM | POA: Diagnosis not present

## 2024-05-26 DIAGNOSIS — F5105 Insomnia due to other mental disorder: Secondary | ICD-10-CM | POA: Diagnosis not present

## 2024-05-26 DIAGNOSIS — R5383 Other fatigue: Secondary | ICD-10-CM

## 2024-05-26 DIAGNOSIS — F902 Attention-deficit hyperactivity disorder, combined type: Secondary | ICD-10-CM | POA: Diagnosis not present

## 2024-05-26 MED ORDER — AMPHETAMINE-DEXTROAMPHETAMINE 20 MG PO TABS: 20.0000 mg | ORAL_TABLET | Freq: Three times a day (TID) | ORAL | 0 refills | Status: AC

## 2024-05-26 NOTE — Progress Notes (Signed)
 [facility]  Patient ID: Kelli Navarro,  MRN: [id number]  PCP: Ziglar, Samantha P, NP  Date of Evaluation: 05/26/2024 Time spent:20 minutes  Chief Complaint:  Chief Complaint   ADD; Insomnia; Follow-up    HISTORY/CURRENT STATUS: HPI  For routine f/u  She never took the Elavil , she called back the day after the the appt and the message I got was she had an allergic rxn.  However she denies an allergy, states it didn't help so she didn't take it.  She had not reported ever trying it before, which is the reason it was prescribed.. Has tried numerous med combos in the past to help her sleep and some were effective and most were not.  She continues to sleep only a few hours per night.  Has trouble falling asleep and staying asleep.  She has always had trouble sleeping but it has been worse over the past several months.  She does not nap during the day.  Not snoring.  Energy and motivation depend on how well she has slept the night before.  No extreme sadness, tearfulness, or feelings of hopelessness.  ADLs and personal hygiene are normal.   No changes in memory.  Adderall is effective to help her stay on task and get things done in a timely manner.  Appetite has not changed.  Weight is stable.  No mania, delirium, AH/VH.  No SI/HI.  Individual Medical History/ Review of Systems: Changes? :Yes  she had pneumonia since LOV  Past medications for mental health diagnoses include: Elavil -pt said it didn't work Buspar- caused dizziness Luvox- adverse effects Prozac - increased anxiety at 40 mg. Ineffective.  Doxepin  Remeron  Wellbutrin Trazodone  Abilify  Rexulti  Olanzapine  Seroquel  Topamax  Clonidine  Klonopin  Gabapentin  Adderall  Allergies: Hydrocortisone, Other, Aptiom [eslicarbazepine], Azithromycin, Bupropion, Carbamazepine, Dilantin [phenytoin sodium extended], Diphenhydramine , Divalproex sodium, Keppra [levetiracetam], Lamictal [lamotrigine], Lorazepam , Lyrica  [pregabalin], Morphine  and codeine, Prednisone, and Vimpat [lacosamide]  Current Medications:  Current Outpatient Medications:    amLODipine (NORVASC) 2.5 MG tablet, Take 2.5 mg by mouth daily., Disp: , Rfl:    [START ON 06/13/2024] amphetamine -dextroamphetamine  (ADDERALL) 20 MG tablet, Take 1 tablet (20 mg total) by mouth 3 (three) times daily., Disp: 90 tablet, Rfl: 0   cloNIDine  (CATAPRES ) 0.1 MG tablet, Take 1 tablet (0.1 mg total) by mouth 2 (two) times daily. May taken an additional 1/2-1 tablet as needed, Disp: 270 tablet, Rfl: 1   [facility] ASPIRIN  ADULT LOW DOSE 81 MG chewable tablet, Chew 81 mg by mouth daily., Disp: , Rfl:    ipratropium (ATROVENT ) 0.03 % nasal spray, Place into both nostrils., Disp: , Rfl:    Multiple Vitamin (MULTIVITAMIN WITH MINERALS) TABS tablet, Take 1 tablet by mouth daily., Disp: , Rfl:    rosuvastatin (CRESTOR) 5 MG tablet, Take 2.5 mg by mouth daily., Disp: , Rfl:    topiramate  (TOPAMAX ) 100 MG tablet, Take 1.5 tablets twice daily, Disp: 270 tablet, Rfl: 0   traMADol  (ULTRAM -ER) 200 MG 24 hr tablet, Take 200 mg by mouth daily., Disp: , Rfl:    acetaminophen  (TYLENOL ) 500 MG tablet, Take 1,000 mg by mouth every 6 (six) hours as needed for moderate pain (pain score 4-6) or headache (pain)., Disp: , Rfl:    [START ON 08/11/2024] amphetamine -dextroamphetamine  (ADDERALL) 20 MG tablet, Take 1 tablet (20 mg total) by mouth 3 (three) times daily., Disp: 90 tablet, Rfl: 0   [START ON 07/13/2024] amphetamine -dextroamphetamine  (ADDERALL) 20 MG tablet, Take 1 tablet (20 mg total) by mouth 3 (three) times  daily., Disp: 90 tablet, Rfl: 0   eszopiclone (LUNESTA) 2 MG TABS tablet, Take by mouth. (Patient not taking: Reported on 03/31/2024), Disp: , Rfl:  Medication Side Effects: none  Family Medical/ Social History: Changes? Her wife had an accident, broke her hand since LOV.  She's doing ok now.   MENTAL HEALTH EXAM:  There were no vitals taken for this visit.There is no height or  weight on file to calculate BMI.  General Appearance: Casual and Well Groomed  Eye Contact:  Good  Speech:  Clear and Coherent and Normal Rate  Volume:  Normal  Mood:  Euthymic  Affect:  Congruent  Thought Process:  Goal Directed and Descriptions of Associations: Circumstantial  Orientation:  Full (Time, Place, and Person)  Thought Content: Logical   Suicidal Thoughts:  No  Homicidal Thoughts:  No  Memory:  WNL  Judgement:  Good  Insight:  Good  Psychomotor Activity:  Normal  Concentration:  Concentration: Good and Attention Span: Good  Recall:  Good  Fund of Knowledge: Good  Language: Good  Assets:  Communication Skills Desire for Improvement Financial Resources/Insurance Housing Transportation  ADL's:  Intact  Cognition: WNL  Prognosis:  Good   DIAGNOSES:    ICD-10-CM   1. Attention deficit hyperactivity disorder (ADHD), combined type  F90.2 amphetamine -dextroamphetamine  (ADDERALL) 20 MG tablet    amphetamine -dextroamphetamine  (ADDERALL) 20 MG tablet    2. Insomnia due to other mental disorder  F51.05    F99     3. Malaise and fatigue  R53.81    R53.83      Receiving Psychotherapy: No   RECOMMENDATIONS:  PDMP reviewed.  Adderall filled 05/18/2024.  Tramadol  filled 05/18/2024. I provided approximately  20 minutes of face to face time during this encounter, including time spent before and after the visit in records review, medical decision making, counseling pertinent to today's visit, and charting.   We discussed the insomnia.  I would like to refer her to Dr. Dedra Dohmeier or another sleep specialist because she has tried numerous meds for sleep and most were ineffective.  She prefers not to see a sleep specialist and says her PCP will treat her for the insomnia.  Her PCP only wanted to make sure there would be no drug interactions.  Without knowing which drug she is prescribing that is hard to say, but doubtful with usual medications for sleep.  No changes in  other medications.  Continue Adderall 20 mg, 1 p.o. 3 times daily. Continue clonidine  0.1 mg, 1 p.o. twice daily, and may take an additional 1/2-1 during the day as needed anxiety. Continue Topamax  100 mg, 1.5 pills twice daily. Return in 3 months.   Verneita Cooks, PA-C

## 2024-06-23 ENCOUNTER — Ambulatory Visit: Admitting: Physician Assistant

## 2024-07-18 ENCOUNTER — Telehealth: Payer: Self-pay | Admitting: Physician Assistant

## 2024-07-18 ENCOUNTER — Telehealth: Payer: Self-pay

## 2024-07-18 NOTE — Telephone Encounter (Signed)
"  PA done and approved   "

## 2024-07-18 NOTE — Telephone Encounter (Signed)
 Patient lvm at 11:01 needing a PA fpr Adderall 20mg . PH: [phone] Appt 2/19 Pharmacy [facility] [address] [place]

## 2024-07-18 NOTE — Telephone Encounter (Addendum)
 Prior Authorization Amphetamine -Dextroamphetamine  20MG  tablets #90/30 [employer]  Approved Effective Date: 07/07/2024 Authorization Expiration Date: 07/06/2025

## 2024-08-25 ENCOUNTER — Ambulatory Visit: Admitting: Physician Assistant
